# Patient Record
Sex: Female | Born: 1935 | Race: White | Hispanic: No | Smoking: Former smoker
Health system: Southern US, Community
[De-identification: ages and names within clinical notes are randomized; demographics above are authoritative.]

## PROBLEM LIST (undated history)

## (undated) DIAGNOSIS — I447 Left bundle-branch block, unspecified: Secondary | ICD-10-CM

## (undated) DIAGNOSIS — I739 Peripheral vascular disease, unspecified: Secondary | ICD-10-CM

## (undated) DIAGNOSIS — I4891 Unspecified atrial fibrillation: Secondary | ICD-10-CM

## (undated) DIAGNOSIS — I495 Sick sinus syndrome: Secondary | ICD-10-CM

## (undated) DIAGNOSIS — Z9289 Personal history of other medical treatment: Secondary | ICD-10-CM

## (undated) DIAGNOSIS — I2510 Atherosclerotic heart disease of native coronary artery without angina pectoris: Secondary | ICD-10-CM

## (undated) DIAGNOSIS — Z86711 Personal history of pulmonary embolism: Secondary | ICD-10-CM

## (undated) DIAGNOSIS — Z853 Personal history of malignant neoplasm of breast: Secondary | ICD-10-CM

## (undated) DIAGNOSIS — E785 Hyperlipidemia, unspecified: Secondary | ICD-10-CM

## (undated) DIAGNOSIS — R414 Neurologic neglect syndrome: Secondary | ICD-10-CM

## (undated) DIAGNOSIS — D509 Iron deficiency anemia, unspecified: Secondary | ICD-10-CM

## (undated) DIAGNOSIS — I10 Essential (primary) hypertension: Secondary | ICD-10-CM

## (undated) DIAGNOSIS — J449 Chronic obstructive pulmonary disease, unspecified: Secondary | ICD-10-CM

## (undated) DIAGNOSIS — E039 Hypothyroidism, unspecified: Secondary | ICD-10-CM

## (undated) DIAGNOSIS — R040 Epistaxis: Secondary | ICD-10-CM

## (undated) DIAGNOSIS — I350 Nonrheumatic aortic (valve) stenosis: Secondary | ICD-10-CM

## (undated) DIAGNOSIS — Z950 Presence of cardiac pacemaker: Secondary | ICD-10-CM

## (undated) DIAGNOSIS — Z8673 Personal history of transient ischemic attack (TIA), and cerebral infarction without residual deficits: Secondary | ICD-10-CM

## (undated) DIAGNOSIS — C801 Malignant (primary) neoplasm, unspecified: Secondary | ICD-10-CM

## (undated) DIAGNOSIS — Z8719 Personal history of other diseases of the digestive system: Secondary | ICD-10-CM

## (undated) DIAGNOSIS — M1990 Unspecified osteoarthritis, unspecified site: Secondary | ICD-10-CM

## (undated) DIAGNOSIS — K219 Gastro-esophageal reflux disease without esophagitis: Secondary | ICD-10-CM

## (undated) DIAGNOSIS — I639 Cerebral infarction, unspecified: Secondary | ICD-10-CM

## (undated) HISTORY — DX: Iron deficiency anemia, unspecified: D50.9

## (undated) HISTORY — PX: THYROIDECTOMY: SHX17

## (undated) HISTORY — DX: Personal history of transient ischemic attack (TIA), and cerebral infarction without residual deficits: Z86.73

## (undated) HISTORY — DX: Unspecified osteoarthritis, unspecified site: M19.90

## (undated) HISTORY — PX: TOTAL HIP ARTHROPLASTY: SHX124

## (undated) HISTORY — DX: Cerebral infarction, unspecified: I63.9

## (undated) HISTORY — PX: PACEMAKER PLACEMENT: SHX43

## (undated) HISTORY — DX: Peripheral vascular disease, unspecified: I73.9

## (undated) HISTORY — DX: Essential (primary) hypertension: I10

## (undated) HISTORY — DX: Sick sinus syndrome: I49.5

## (undated) HISTORY — DX: Epistaxis: R04.0

## (undated) HISTORY — DX: Presence of cardiac pacemaker: Z95.0

## (undated) HISTORY — PX: MITRAL VALVE REPAIR: SHX2039

## (undated) HISTORY — DX: Personal history of pulmonary embolism: Z86.711

## (undated) HISTORY — DX: Unspecified atrial fibrillation: I48.91

## (undated) HISTORY — DX: Personal history of malignant neoplasm of breast: Z85.3

## (undated) HISTORY — DX: Personal history of other diseases of the digestive system: Z87.19

## (undated) HISTORY — DX: Personal history of other medical treatment: Z92.89

## (undated) HISTORY — DX: Atherosclerotic heart disease of native coronary artery without angina pectoris: I25.10

## (undated) HISTORY — DX: Hyperlipidemia, unspecified: E78.5

## (undated) HISTORY — DX: Chronic obstructive pulmonary disease, unspecified: J44.9

## (undated) HISTORY — DX: Left bundle-branch block, unspecified: I44.7

## (undated) HISTORY — DX: Nonrheumatic aortic (valve) stenosis: I35.0

## (undated) HISTORY — PX: ABDOMINAL HYSTERECTOMY: SHX81

## (undated) HISTORY — DX: Hypothyroidism, unspecified: E03.9

## (undated) HISTORY — DX: Neurologic neglect syndrome: R41.4

## (undated) HISTORY — PX: MASTECTOMY: SHX3

## (undated) HISTORY — DX: Gastro-esophageal reflux disease without esophagitis: K21.9

## (undated) HISTORY — PX: INTRAOCULAR LENS INSERTION: SHX110

---

## 2008-12-26 ENCOUNTER — Encounter: Payer: Self-pay | Admitting: Cardiology

## 2008-12-26 ENCOUNTER — Encounter: Payer: Self-pay | Admitting: Internal Medicine

## 2009-01-15 ENCOUNTER — Encounter: Payer: Self-pay | Admitting: Cardiology

## 2009-01-15 ENCOUNTER — Encounter: Payer: Self-pay | Admitting: Internal Medicine

## 2009-04-01 ENCOUNTER — Encounter: Payer: Self-pay | Admitting: Cardiology

## 2009-04-02 ENCOUNTER — Encounter: Payer: Self-pay | Admitting: Internal Medicine

## 2009-04-02 ENCOUNTER — Encounter: Payer: Self-pay | Admitting: Cardiology

## 2009-04-03 ENCOUNTER — Encounter: Payer: Self-pay | Admitting: Cardiology

## 2009-04-03 ENCOUNTER — Encounter: Payer: Self-pay | Admitting: Internal Medicine

## 2009-04-20 ENCOUNTER — Encounter: Payer: Self-pay | Admitting: Cardiology

## 2009-05-25 ENCOUNTER — Ambulatory Visit: Payer: Self-pay | Admitting: Internal Medicine

## 2009-05-25 DIAGNOSIS — Z8541 Personal history of malignant neoplasm of cervix uteri: Secondary | ICD-10-CM

## 2009-05-25 DIAGNOSIS — J45909 Unspecified asthma, uncomplicated: Secondary | ICD-10-CM | POA: Insufficient documentation

## 2009-05-25 DIAGNOSIS — Z86718 Personal history of other venous thrombosis and embolism: Secondary | ICD-10-CM

## 2009-05-25 DIAGNOSIS — E039 Hypothyroidism, unspecified: Secondary | ICD-10-CM

## 2009-05-25 DIAGNOSIS — I4891 Unspecified atrial fibrillation: Secondary | ICD-10-CM

## 2009-05-25 DIAGNOSIS — M810 Age-related osteoporosis without current pathological fracture: Secondary | ICD-10-CM | POA: Insufficient documentation

## 2009-05-25 DIAGNOSIS — Z853 Personal history of malignant neoplasm of breast: Secondary | ICD-10-CM

## 2009-05-25 DIAGNOSIS — I2510 Atherosclerotic heart disease of native coronary artery without angina pectoris: Secondary | ICD-10-CM

## 2009-05-25 DIAGNOSIS — D509 Iron deficiency anemia, unspecified: Secondary | ICD-10-CM

## 2009-05-25 DIAGNOSIS — Z8679 Personal history of other diseases of the circulatory system: Secondary | ICD-10-CM | POA: Insufficient documentation

## 2009-05-25 DIAGNOSIS — E785 Hyperlipidemia, unspecified: Secondary | ICD-10-CM

## 2009-05-25 DIAGNOSIS — Z8719 Personal history of other diseases of the digestive system: Secondary | ICD-10-CM

## 2009-05-25 LAB — CONVERTED CEMR LAB
Albumin: 4 g/dL (ref 3.5–5.2)
Alkaline Phosphatase: 90 units/L (ref 39–117)
Basophils Relative: 2 % — ABNORMAL HIGH (ref 0–1)
Bilirubin, Direct: 0.1 mg/dL (ref 0.0–0.3)
Calcium: 8.7 mg/dL (ref 8.4–10.5)
Eosinophils Relative: 5 % (ref 0–5)
Glucose, Bld: 116 mg/dL — ABNORMAL HIGH (ref 70–99)
HCT: 30 % — ABNORMAL LOW (ref 36.0–46.0)
Hemoglobin: 9.4 g/dL — ABNORMAL LOW (ref 12.0–15.0)
MCHC: 31.3 g/dL (ref 30.0–36.0)
MCV: 87.5 fL (ref 78.0–100.0)
Monocytes Absolute: 0.6 10*3/uL (ref 0.1–1.0)
Monocytes Relative: 9 % (ref 3–12)
RDW: 17.2 % — ABNORMAL HIGH (ref 11.5–15.5)
Sodium: 136 meq/L (ref 135–145)
Total Bilirubin: 0.6 mg/dL (ref 0.3–1.2)
Vit D, 1,25-Dihydroxy: 39 (ref 30–89)

## 2009-05-27 ENCOUNTER — Emergency Department (HOSPITAL_COMMUNITY): Admission: EM | Admit: 2009-05-27 | Discharge: 2009-05-27 | Payer: Self-pay | Admitting: Emergency Medicine

## 2009-05-27 ENCOUNTER — Telehealth: Payer: Self-pay | Admitting: Internal Medicine

## 2009-05-28 ENCOUNTER — Telehealth: Payer: Self-pay | Admitting: Internal Medicine

## 2009-05-28 ENCOUNTER — Ambulatory Visit: Payer: Self-pay | Admitting: Hematology & Oncology

## 2009-06-01 ENCOUNTER — Ambulatory Visit: Payer: Self-pay | Admitting: Cardiovascular Disease

## 2009-06-01 LAB — CONVERTED CEMR LAB: POC INR: 2.4

## 2009-06-08 ENCOUNTER — Encounter: Payer: Self-pay | Admitting: Cardiology

## 2009-06-08 ENCOUNTER — Ambulatory Visit: Payer: Self-pay | Admitting: Internal Medicine

## 2009-06-08 DIAGNOSIS — Z87891 Personal history of nicotine dependence: Secondary | ICD-10-CM | POA: Insufficient documentation

## 2009-06-08 DIAGNOSIS — J4489 Other specified chronic obstructive pulmonary disease: Secondary | ICD-10-CM | POA: Insufficient documentation

## 2009-06-08 DIAGNOSIS — R040 Epistaxis: Secondary | ICD-10-CM

## 2009-06-08 DIAGNOSIS — R079 Chest pain, unspecified: Secondary | ICD-10-CM

## 2009-06-08 DIAGNOSIS — J449 Chronic obstructive pulmonary disease, unspecified: Secondary | ICD-10-CM

## 2009-06-08 LAB — CONVERTED CEMR LAB: INR: 2.83 — ABNORMAL HIGH (ref <1.50)

## 2009-06-10 ENCOUNTER — Encounter: Payer: Self-pay | Admitting: Internal Medicine

## 2009-06-12 ENCOUNTER — Telehealth: Payer: Self-pay | Admitting: Internal Medicine

## 2009-06-15 ENCOUNTER — Encounter: Payer: Self-pay | Admitting: Cardiology

## 2009-06-15 ENCOUNTER — Telehealth: Payer: Self-pay | Admitting: Internal Medicine

## 2009-06-19 ENCOUNTER — Encounter: Payer: Self-pay | Admitting: Internal Medicine

## 2009-06-24 ENCOUNTER — Encounter: Payer: Self-pay | Admitting: Cardiology

## 2009-06-24 ENCOUNTER — Ambulatory Visit: Payer: Self-pay | Admitting: Cardiology

## 2009-06-24 DIAGNOSIS — Z9889 Other specified postprocedural states: Secondary | ICD-10-CM

## 2009-06-24 DIAGNOSIS — Z950 Presence of cardiac pacemaker: Secondary | ICD-10-CM | POA: Insufficient documentation

## 2009-06-24 DIAGNOSIS — I350 Nonrheumatic aortic (valve) stenosis: Secondary | ICD-10-CM | POA: Insufficient documentation

## 2009-06-25 ENCOUNTER — Ambulatory Visit: Payer: Self-pay | Admitting: Internal Medicine

## 2009-06-26 ENCOUNTER — Encounter: Payer: Self-pay | Admitting: Internal Medicine

## 2009-06-26 ENCOUNTER — Telehealth: Payer: Self-pay | Admitting: Internal Medicine

## 2009-06-30 ENCOUNTER — Ambulatory Visit: Payer: Self-pay | Admitting: Internal Medicine

## 2009-06-30 ENCOUNTER — Encounter: Payer: Self-pay | Admitting: Family

## 2009-06-30 LAB — CONVERTED CEMR LAB: INR: 3.17 — ABNORMAL HIGH (ref <1.50)

## 2009-07-01 ENCOUNTER — Telehealth: Payer: Self-pay | Admitting: Internal Medicine

## 2009-07-03 ENCOUNTER — Ambulatory Visit: Payer: Self-pay | Admitting: Hematology & Oncology

## 2009-07-06 ENCOUNTER — Encounter: Payer: Self-pay | Admitting: Internal Medicine

## 2009-07-06 LAB — CBC WITH DIFFERENTIAL (CANCER CENTER ONLY)
BASO%: 0.7 % (ref 0.0–2.0)
HCT: 24.8 % — ABNORMAL LOW (ref 34.8–46.6)
LYMPH#: 1.6 10*3/uL (ref 0.9–3.3)
MONO#: 0.4 10*3/uL (ref 0.1–0.9)
NEUT#: 4.4 10*3/uL (ref 1.5–6.5)
NEUT%: 65.1 % (ref 39.6–80.0)
Platelets: 331 10*3/uL (ref 145–400)
RDW: 15.1 % — ABNORMAL HIGH (ref 10.5–14.6)
WBC: 6.7 10*3/uL (ref 3.9–10.0)

## 2009-07-06 LAB — PROTHROMBIN TIME: INR: 1.39 (ref <1.50)

## 2009-07-07 LAB — COMPREHENSIVE METABOLIC PANEL
Albumin: 4.1 g/dL (ref 3.5–5.2)
Alkaline Phosphatase: 85 U/L (ref 39–117)
BUN: 21 mg/dL (ref 6–23)
Calcium: 8.7 mg/dL (ref 8.4–10.5)
Glucose, Bld: 114 mg/dL — ABNORMAL HIGH (ref 70–99)
Potassium: 4.7 mEq/L (ref 3.5–5.3)

## 2009-07-07 LAB — VITAMIN D 25 HYDROXY (VIT D DEFICIENCY, FRACTURES): Vit D, 25-Hydroxy: 31 ng/mL (ref 30–89)

## 2009-07-07 LAB — FERRITIN: Ferritin: 15 ng/mL (ref 10–291)

## 2009-07-07 LAB — RETICULOCYTES (CHCC): Retic Ct Pct: 0.9 % (ref 0.4–3.1)

## 2009-07-15 ENCOUNTER — Ambulatory Visit (HOSPITAL_COMMUNITY): Admission: RE | Admit: 2009-07-15 | Discharge: 2009-07-15 | Payer: Self-pay | Admitting: Cardiology

## 2009-07-15 ENCOUNTER — Encounter: Payer: Self-pay | Admitting: Cardiology

## 2009-07-15 ENCOUNTER — Ambulatory Visit: Payer: Self-pay | Admitting: Cardiology

## 2009-07-15 ENCOUNTER — Ambulatory Visit: Payer: Self-pay

## 2009-07-22 ENCOUNTER — Encounter: Payer: Self-pay | Admitting: Cardiology

## 2009-07-22 ENCOUNTER — Ambulatory Visit: Payer: Self-pay | Admitting: Cardiology

## 2009-07-27 ENCOUNTER — Encounter: Payer: Self-pay | Admitting: Internal Medicine

## 2009-07-27 ENCOUNTER — Telehealth: Payer: Self-pay | Admitting: Internal Medicine

## 2009-07-27 LAB — CHCC SATELLITE - SMEAR

## 2009-07-27 LAB — CBC WITH DIFFERENTIAL (CANCER CENTER ONLY)
BASO%: 0.6 % (ref 0.0–2.0)
LYMPH%: 21.1 % (ref 14.0–48.0)
MCV: 84 fL (ref 81–101)
MONO#: 0.4 10*3/uL (ref 0.1–0.9)
Platelets: 252 10*3/uL (ref 145–400)
RDW: 18.9 % — ABNORMAL HIGH (ref 10.5–14.6)
WBC: 6.4 10*3/uL (ref 3.9–10.0)

## 2009-07-29 LAB — RETICULOCYTES (CHCC)
ABS Retic: 43.7 10*3/uL (ref 19.0–186.0)
RBC.: 3.97 MIL/uL (ref 3.87–5.11)

## 2009-07-29 LAB — HEMOGLOBINOPATHY EVALUATION
Hgb A2 Quant: 2.2 % (ref 2.2–3.2)
Hgb F Quant: 0 % (ref 0.0–2.0)

## 2009-08-02 ENCOUNTER — Encounter: Payer: Self-pay | Admitting: Emergency Medicine

## 2009-08-02 ENCOUNTER — Ambulatory Visit: Payer: Self-pay | Admitting: Diagnostic Radiology

## 2009-08-02 ENCOUNTER — Telehealth: Payer: Self-pay | Admitting: Family Medicine

## 2009-08-03 ENCOUNTER — Telehealth: Payer: Self-pay | Admitting: Internal Medicine

## 2009-08-03 ENCOUNTER — Inpatient Hospital Stay (HOSPITAL_COMMUNITY): Admission: EM | Admit: 2009-08-03 | Discharge: 2009-08-04 | Payer: Self-pay | Admitting: Internal Medicine

## 2009-08-05 ENCOUNTER — Telehealth: Payer: Self-pay | Admitting: Internal Medicine

## 2009-08-05 ENCOUNTER — Encounter: Payer: Self-pay | Admitting: Internal Medicine

## 2009-08-05 ENCOUNTER — Ambulatory Visit: Payer: Self-pay | Admitting: Internal Medicine

## 2009-08-05 DIAGNOSIS — R5381 Other malaise: Secondary | ICD-10-CM | POA: Insufficient documentation

## 2009-08-05 DIAGNOSIS — R413 Other amnesia: Secondary | ICD-10-CM

## 2009-08-05 DIAGNOSIS — J209 Acute bronchitis, unspecified: Secondary | ICD-10-CM

## 2009-08-05 LAB — CONVERTED CEMR LAB
POC INR: 1.45
Prothrombin Time: 17.5 s — ABNORMAL HIGH (ref 11.6–15.2)
Vitamin B-12: 598 pg/mL (ref 211–911)

## 2009-08-10 ENCOUNTER — Ambulatory Visit: Payer: Self-pay | Admitting: Diagnostic Radiology

## 2009-08-10 ENCOUNTER — Ambulatory Visit: Payer: Self-pay | Admitting: Internal Medicine

## 2009-08-10 ENCOUNTER — Ambulatory Visit (HOSPITAL_BASED_OUTPATIENT_CLINIC_OR_DEPARTMENT_OTHER): Admission: RE | Admit: 2009-08-10 | Discharge: 2009-08-10 | Payer: Self-pay | Admitting: Internal Medicine

## 2009-08-11 ENCOUNTER — Encounter: Payer: Self-pay | Admitting: Internal Medicine

## 2009-08-12 ENCOUNTER — Ambulatory Visit: Payer: Self-pay | Admitting: Diagnostic Radiology

## 2009-08-12 ENCOUNTER — Telehealth: Payer: Self-pay | Admitting: Internal Medicine

## 2009-08-12 ENCOUNTER — Encounter: Payer: Self-pay | Admitting: Internal Medicine

## 2009-08-12 ENCOUNTER — Ambulatory Visit (HOSPITAL_BASED_OUTPATIENT_CLINIC_OR_DEPARTMENT_OTHER): Admission: RE | Admit: 2009-08-12 | Discharge: 2009-08-12 | Payer: Self-pay | Admitting: Internal Medicine

## 2009-08-13 ENCOUNTER — Telehealth: Payer: Self-pay | Admitting: Internal Medicine

## 2009-08-14 ENCOUNTER — Encounter: Payer: Self-pay | Admitting: Internal Medicine

## 2009-08-19 ENCOUNTER — Telehealth: Payer: Self-pay | Admitting: Internal Medicine

## 2009-08-20 ENCOUNTER — Telehealth: Payer: Self-pay | Admitting: Internal Medicine

## 2009-08-20 ENCOUNTER — Encounter: Payer: Self-pay | Admitting: Internal Medicine

## 2009-08-20 ENCOUNTER — Ambulatory Visit: Payer: Self-pay | Admitting: Hematology & Oncology

## 2009-08-21 ENCOUNTER — Telehealth: Payer: Self-pay | Admitting: Internal Medicine

## 2009-08-25 ENCOUNTER — Telehealth: Payer: Self-pay | Admitting: Internal Medicine

## 2009-08-27 ENCOUNTER — Encounter: Payer: Self-pay | Admitting: Internal Medicine

## 2009-08-27 ENCOUNTER — Telehealth: Payer: Self-pay | Admitting: Internal Medicine

## 2009-08-28 ENCOUNTER — Encounter: Payer: Self-pay | Admitting: Internal Medicine

## 2009-08-28 ENCOUNTER — Telehealth (INDEPENDENT_AMBULATORY_CARE_PROVIDER_SITE_OTHER): Payer: Self-pay | Admitting: *Deleted

## 2009-08-31 ENCOUNTER — Ambulatory Visit: Payer: Self-pay | Admitting: Internal Medicine

## 2009-08-31 DIAGNOSIS — G4700 Insomnia, unspecified: Secondary | ICD-10-CM

## 2009-08-31 DIAGNOSIS — N993 Prolapse of vaginal vault after hysterectomy: Secondary | ICD-10-CM

## 2009-08-31 LAB — CBC WITH DIFFERENTIAL (CANCER CENTER ONLY)
BASO%: 0.6 % (ref 0.0–2.0)
EOS%: 4.7 % (ref 0.0–7.0)
HCT: 39.2 % (ref 34.8–46.6)
LYMPH%: 26 % (ref 14.0–48.0)
MCHC: 33.2 g/dL (ref 32.0–36.0)
MCV: 91 fL (ref 81–101)
MONO%: 7.3 % (ref 0.0–13.0)
NEUT%: 61.4 % (ref 39.6–80.0)
Platelets: 254 10*3/uL (ref 145–400)
RDW: 18.8 % — ABNORMAL HIGH (ref 10.5–14.6)

## 2009-09-01 ENCOUNTER — Telehealth: Payer: Self-pay | Admitting: Internal Medicine

## 2009-09-02 ENCOUNTER — Encounter: Payer: Self-pay | Admitting: Internal Medicine

## 2009-09-03 ENCOUNTER — Ambulatory Visit: Payer: Self-pay | Admitting: Internal Medicine

## 2009-09-04 ENCOUNTER — Telehealth: Payer: Self-pay | Admitting: Internal Medicine

## 2009-09-08 ENCOUNTER — Encounter: Payer: Self-pay | Admitting: Internal Medicine

## 2009-09-08 ENCOUNTER — Telehealth: Payer: Self-pay | Admitting: Internal Medicine

## 2009-09-11 ENCOUNTER — Encounter: Payer: Self-pay | Admitting: Internal Medicine

## 2009-09-11 ENCOUNTER — Ambulatory Visit: Payer: Self-pay | Admitting: Internal Medicine

## 2009-09-15 ENCOUNTER — Telehealth: Payer: Self-pay | Admitting: Internal Medicine

## 2009-09-15 ENCOUNTER — Encounter: Payer: Self-pay | Admitting: Internal Medicine

## 2009-09-18 ENCOUNTER — Encounter: Payer: Self-pay | Admitting: Internal Medicine

## 2009-09-21 ENCOUNTER — Encounter: Payer: Self-pay | Admitting: Internal Medicine

## 2009-09-22 ENCOUNTER — Encounter: Payer: Self-pay | Admitting: Internal Medicine

## 2009-09-22 ENCOUNTER — Telehealth: Payer: Self-pay | Admitting: Internal Medicine

## 2009-09-23 ENCOUNTER — Ambulatory Visit: Payer: Self-pay | Admitting: Family Medicine

## 2009-09-23 ENCOUNTER — Telehealth: Payer: Self-pay | Admitting: Internal Medicine

## 2009-09-24 ENCOUNTER — Ambulatory Visit: Payer: Self-pay | Admitting: Internal Medicine

## 2009-09-27 ENCOUNTER — Encounter: Payer: Self-pay | Admitting: Internal Medicine

## 2009-09-28 ENCOUNTER — Telehealth: Payer: Self-pay | Admitting: Internal Medicine

## 2009-09-30 ENCOUNTER — Encounter: Payer: Self-pay | Admitting: Internal Medicine

## 2009-10-04 ENCOUNTER — Encounter: Payer: Self-pay | Admitting: Internal Medicine

## 2009-10-08 ENCOUNTER — Ambulatory Visit: Payer: Self-pay | Admitting: Internal Medicine

## 2009-10-13 ENCOUNTER — Telehealth: Payer: Self-pay | Admitting: Internal Medicine

## 2009-10-28 ENCOUNTER — Telehealth: Payer: Self-pay | Admitting: Internal Medicine

## 2009-10-29 ENCOUNTER — Telehealth: Payer: Self-pay | Admitting: Internal Medicine

## 2009-10-30 ENCOUNTER — Ambulatory Visit: Payer: Self-pay | Admitting: Internal Medicine

## 2009-10-30 ENCOUNTER — Ambulatory Visit: Payer: Self-pay | Admitting: Radiology

## 2009-10-30 ENCOUNTER — Telehealth: Payer: Self-pay | Admitting: Internal Medicine

## 2009-10-30 ENCOUNTER — Ambulatory Visit (HOSPITAL_BASED_OUTPATIENT_CLINIC_OR_DEPARTMENT_OTHER): Admission: RE | Admit: 2009-10-30 | Discharge: 2009-10-30 | Payer: Self-pay | Admitting: Internal Medicine

## 2009-10-30 DIAGNOSIS — R17 Unspecified jaundice: Secondary | ICD-10-CM | POA: Insufficient documentation

## 2009-10-30 DIAGNOSIS — N644 Mastodynia: Secondary | ICD-10-CM | POA: Insufficient documentation

## 2009-10-30 LAB — CONVERTED CEMR LAB
AST: 44 units/L — ABNORMAL HIGH (ref 0–37)
Albumin: 3.6 g/dL (ref 3.5–5.2)
BUN: 32 mg/dL — ABNORMAL HIGH (ref 6–23)
Basophils Absolute: 0 10*3/uL (ref 0.0–0.1)
Basophils Relative: 0 % (ref 0–1)
Calcium: 7.9 mg/dL — ABNORMAL LOW (ref 8.4–10.5)
Eosinophils Absolute: 0 10*3/uL (ref 0.0–0.7)
Glucose, Bld: 76 mg/dL (ref 70–99)
MCHC: 32.5 g/dL (ref 30.0–36.0)
MCV: 97.8 fL (ref 78.0–100.0)
Neutrophils Relative %: 82 % — ABNORMAL HIGH (ref 43–77)
Platelets: 217 10*3/uL (ref 150–400)
Total Bilirubin: 0.9 mg/dL (ref 0.3–1.2)

## 2009-11-02 ENCOUNTER — Telehealth: Payer: Self-pay | Admitting: Internal Medicine

## 2009-11-03 ENCOUNTER — Encounter: Payer: Self-pay | Admitting: Internal Medicine

## 2009-11-03 LAB — HM MAMMOGRAPHY: HM Mammogram: NORMAL

## 2009-11-05 ENCOUNTER — Ambulatory Visit: Payer: Self-pay | Admitting: Internal Medicine

## 2009-11-05 DIAGNOSIS — F329 Major depressive disorder, single episode, unspecified: Secondary | ICD-10-CM

## 2009-11-05 LAB — CONVERTED CEMR LAB: Prothrombin Time: 24.2 s — ABNORMAL HIGH (ref 11.6–15.2)

## 2009-11-06 ENCOUNTER — Telehealth: Payer: Self-pay | Admitting: Internal Medicine

## 2009-11-06 ENCOUNTER — Encounter: Payer: Self-pay | Admitting: Internal Medicine

## 2009-11-09 ENCOUNTER — Telehealth: Payer: Self-pay | Admitting: Internal Medicine

## 2009-11-09 LAB — CONVERTED CEMR LAB
INR: 2.55 — ABNORMAL HIGH (ref <1.50)
Prothrombin Time: 27.2 s — ABNORMAL HIGH (ref 11.6–15.2)

## 2009-11-10 ENCOUNTER — Telehealth: Payer: Self-pay | Admitting: Internal Medicine

## 2009-11-10 ENCOUNTER — Encounter: Payer: Self-pay | Admitting: Internal Medicine

## 2009-11-11 ENCOUNTER — Encounter: Payer: Self-pay | Admitting: Internal Medicine

## 2009-11-12 ENCOUNTER — Telehealth: Payer: Self-pay | Admitting: Internal Medicine

## 2009-11-13 ENCOUNTER — Telehealth: Payer: Self-pay | Admitting: Internal Medicine

## 2009-11-16 ENCOUNTER — Encounter: Payer: Self-pay | Admitting: Internal Medicine

## 2009-11-17 ENCOUNTER — Telehealth: Payer: Self-pay | Admitting: Internal Medicine

## 2009-11-30 ENCOUNTER — Ambulatory Visit: Payer: Self-pay | Admitting: Hematology & Oncology

## 2009-12-04 ENCOUNTER — Ambulatory Visit: Payer: Self-pay | Admitting: Internal Medicine

## 2009-12-04 DIAGNOSIS — I739 Peripheral vascular disease, unspecified: Secondary | ICD-10-CM | POA: Insufficient documentation

## 2009-12-07 ENCOUNTER — Telehealth: Payer: Self-pay | Admitting: Internal Medicine

## 2009-12-09 ENCOUNTER — Telehealth: Payer: Self-pay | Admitting: Internal Medicine

## 2009-12-10 ENCOUNTER — Encounter: Payer: Self-pay | Admitting: Internal Medicine

## 2009-12-14 ENCOUNTER — Encounter: Payer: Self-pay | Admitting: Internal Medicine

## 2009-12-14 LAB — CONVERTED CEMR LAB: INR: 1.39 (ref <1.50)

## 2009-12-15 ENCOUNTER — Telehealth: Payer: Self-pay | Admitting: Internal Medicine

## 2009-12-16 ENCOUNTER — Telehealth: Payer: Self-pay | Admitting: Internal Medicine

## 2009-12-17 ENCOUNTER — Encounter: Payer: Self-pay | Admitting: Internal Medicine

## 2009-12-21 ENCOUNTER — Telehealth: Payer: Self-pay | Admitting: Internal Medicine

## 2009-12-25 ENCOUNTER — Encounter: Payer: Self-pay | Admitting: Cardiology

## 2009-12-28 ENCOUNTER — Telehealth: Payer: Self-pay | Admitting: Internal Medicine

## 2010-01-13 ENCOUNTER — Encounter: Payer: Self-pay | Admitting: Internal Medicine

## 2010-01-25 ENCOUNTER — Ambulatory Visit: Payer: Self-pay | Admitting: Radiology

## 2010-01-25 ENCOUNTER — Ambulatory Visit: Payer: Self-pay | Admitting: Internal Medicine

## 2010-01-25 ENCOUNTER — Ambulatory Visit (HOSPITAL_BASED_OUTPATIENT_CLINIC_OR_DEPARTMENT_OTHER): Admission: RE | Admit: 2010-01-25 | Discharge: 2010-01-25 | Payer: Self-pay | Admitting: Internal Medicine

## 2010-01-25 DIAGNOSIS — J189 Pneumonia, unspecified organism: Secondary | ICD-10-CM

## 2010-01-26 ENCOUNTER — Telehealth: Payer: Self-pay | Admitting: Internal Medicine

## 2010-02-01 LAB — CONVERTED CEMR LAB
INR: 4.48 — ABNORMAL HIGH (ref <1.50)
Prothrombin Time: 42.3 s — ABNORMAL HIGH (ref 11.6–15.2)

## 2010-02-02 ENCOUNTER — Encounter: Payer: Self-pay | Admitting: Internal Medicine

## 2010-02-02 ENCOUNTER — Telehealth: Payer: Self-pay | Admitting: Internal Medicine

## 2010-02-09 ENCOUNTER — Telehealth: Payer: Self-pay | Admitting: Internal Medicine

## 2010-02-09 ENCOUNTER — Ambulatory Visit: Payer: Self-pay | Admitting: Diagnostic Radiology

## 2010-02-09 ENCOUNTER — Observation Stay (HOSPITAL_COMMUNITY): Admission: EM | Admit: 2010-02-09 | Discharge: 2010-02-15 | Payer: Self-pay | Admitting: Internal Medicine

## 2010-02-09 ENCOUNTER — Emergency Department (HOSPITAL_BASED_OUTPATIENT_CLINIC_OR_DEPARTMENT_OTHER): Admission: EM | Admit: 2010-02-09 | Discharge: 2010-02-09 | Payer: Self-pay | Admitting: Emergency Medicine

## 2010-02-10 ENCOUNTER — Telehealth: Payer: Self-pay | Admitting: Internal Medicine

## 2010-02-16 ENCOUNTER — Telehealth: Payer: Self-pay | Admitting: Internal Medicine

## 2010-02-17 ENCOUNTER — Telehealth: Payer: Self-pay | Admitting: Internal Medicine

## 2010-02-18 LAB — CONVERTED CEMR LAB
Basophils Absolute: 0.1 10*3/uL (ref 0.0–0.1)
Hemoglobin: 10.5 g/dL — ABNORMAL LOW (ref 12.0–15.0)
INR: 2.06 — ABNORMAL HIGH (ref <1.50)
Lymphocytes Relative: 28 % (ref 12–46)
Monocytes Absolute: 0.8 10*3/uL (ref 0.1–1.0)
Neutro Abs: 4.2 10*3/uL (ref 1.7–7.7)
Neutrophils Relative %: 56 % (ref 43–77)
Prothrombin Time: 23 s — ABNORMAL HIGH (ref 11.6–15.2)
RDW: 15.2 % (ref 11.5–15.5)

## 2010-02-19 ENCOUNTER — Telehealth: Payer: Self-pay | Admitting: Internal Medicine

## 2010-02-23 ENCOUNTER — Encounter: Payer: Self-pay | Admitting: Internal Medicine

## 2010-02-24 ENCOUNTER — Encounter: Payer: Self-pay | Admitting: Internal Medicine

## 2010-02-25 ENCOUNTER — Encounter: Payer: Self-pay | Admitting: Internal Medicine

## 2010-02-25 ENCOUNTER — Telehealth: Payer: Self-pay | Admitting: Internal Medicine

## 2010-02-26 ENCOUNTER — Ambulatory Visit: Payer: Self-pay | Admitting: Internal Medicine

## 2010-02-26 DIAGNOSIS — R071 Chest pain on breathing: Secondary | ICD-10-CM | POA: Insufficient documentation

## 2010-02-26 LAB — CONVERTED CEMR LAB
INR: 2.51 — ABNORMAL HIGH (ref <1.50)
Prothrombin Time: 26.9 s — ABNORMAL HIGH (ref 11.6–15.2)

## 2010-03-01 ENCOUNTER — Encounter: Payer: Self-pay | Admitting: Internal Medicine

## 2010-03-01 ENCOUNTER — Telehealth: Payer: Self-pay | Admitting: Internal Medicine

## 2010-03-02 ENCOUNTER — Encounter: Payer: Self-pay | Admitting: Internal Medicine

## 2010-03-10 ENCOUNTER — Telehealth: Payer: Self-pay | Admitting: Internal Medicine

## 2010-03-18 ENCOUNTER — Encounter: Payer: Self-pay | Admitting: Internal Medicine

## 2010-03-29 ENCOUNTER — Encounter: Payer: Self-pay | Admitting: Internal Medicine

## 2010-04-10 ENCOUNTER — Emergency Department (HOSPITAL_BASED_OUTPATIENT_CLINIC_OR_DEPARTMENT_OTHER): Admission: EM | Admit: 2010-04-10 | Discharge: 2010-04-10 | Payer: Self-pay | Admitting: Emergency Medicine

## 2010-04-10 ENCOUNTER — Ambulatory Visit: Payer: Self-pay | Admitting: Diagnostic Radiology

## 2010-04-18 ENCOUNTER — Emergency Department (HOSPITAL_COMMUNITY): Admission: EM | Admit: 2010-04-18 | Discharge: 2010-04-18 | Payer: Self-pay | Admitting: Emergency Medicine

## 2010-04-20 ENCOUNTER — Telehealth: Payer: Self-pay | Admitting: Internal Medicine

## 2010-04-20 ENCOUNTER — Encounter: Payer: Self-pay | Admitting: Internal Medicine

## 2010-04-20 ENCOUNTER — Ambulatory Visit: Payer: Self-pay | Admitting: Cardiovascular Disease

## 2010-04-20 ENCOUNTER — Ambulatory Visit: Payer: Self-pay

## 2010-04-21 ENCOUNTER — Ambulatory Visit: Payer: Self-pay | Admitting: Internal Medicine

## 2010-04-21 ENCOUNTER — Telehealth: Payer: Self-pay | Admitting: Internal Medicine

## 2010-04-23 ENCOUNTER — Observation Stay (HOSPITAL_COMMUNITY): Admission: EM | Admit: 2010-04-23 | Discharge: 2010-04-23 | Payer: Self-pay | Admitting: Emergency Medicine

## 2010-04-26 ENCOUNTER — Telehealth: Payer: Self-pay | Admitting: Internal Medicine

## 2010-04-27 ENCOUNTER — Telehealth: Payer: Self-pay | Admitting: Internal Medicine

## 2010-04-29 ENCOUNTER — Encounter: Payer: Self-pay | Admitting: Internal Medicine

## 2010-05-03 ENCOUNTER — Telehealth: Payer: Self-pay | Admitting: Internal Medicine

## 2010-05-05 ENCOUNTER — Telehealth: Payer: Self-pay | Admitting: Internal Medicine

## 2010-05-05 ENCOUNTER — Ambulatory Visit: Payer: Self-pay | Admitting: Internal Medicine

## 2010-05-07 ENCOUNTER — Encounter: Payer: Self-pay | Admitting: Internal Medicine

## 2010-05-11 ENCOUNTER — Ambulatory Visit: Payer: Self-pay | Admitting: Cardiovascular Disease

## 2010-05-12 ENCOUNTER — Encounter: Payer: Self-pay | Admitting: Internal Medicine

## 2010-05-13 ENCOUNTER — Encounter: Payer: Self-pay | Admitting: Internal Medicine

## 2010-05-14 ENCOUNTER — Telehealth: Payer: Self-pay | Admitting: Internal Medicine

## 2010-05-14 ENCOUNTER — Ambulatory Visit: Payer: Self-pay | Admitting: Internal Medicine

## 2010-05-14 ENCOUNTER — Encounter: Payer: Self-pay | Admitting: Internal Medicine

## 2010-05-14 ENCOUNTER — Ambulatory Visit: Payer: Self-pay | Admitting: Diagnostic Radiology

## 2010-05-14 ENCOUNTER — Ambulatory Visit (HOSPITAL_BASED_OUTPATIENT_CLINIC_OR_DEPARTMENT_OTHER)
Admission: RE | Admit: 2010-05-14 | Discharge: 2010-05-14 | Payer: Self-pay | Source: Home / Self Care | Admitting: Internal Medicine

## 2010-05-14 DIAGNOSIS — L02619 Cutaneous abscess of unspecified foot: Secondary | ICD-10-CM | POA: Insufficient documentation

## 2010-05-14 DIAGNOSIS — L03119 Cellulitis of unspecified part of limb: Secondary | ICD-10-CM

## 2010-05-17 ENCOUNTER — Encounter: Payer: Self-pay | Admitting: Internal Medicine

## 2010-05-20 ENCOUNTER — Ambulatory Visit: Payer: Self-pay | Admitting: Internal Medicine

## 2010-05-27 ENCOUNTER — Ambulatory Visit: Payer: Self-pay

## 2010-05-27 ENCOUNTER — Ambulatory Visit: Payer: Self-pay | Admitting: Cardiovascular Disease

## 2010-05-28 ENCOUNTER — Telehealth: Payer: Self-pay | Admitting: Internal Medicine

## 2010-05-28 ENCOUNTER — Encounter: Payer: Self-pay | Admitting: Internal Medicine

## 2010-05-28 LAB — CONVERTED CEMR LAB
BUN: 30 mg/dL — ABNORMAL HIGH (ref 6–23)
Calcium: 8.4 mg/dL (ref 8.4–10.5)
Creatinine, Ser: 1.12 mg/dL (ref 0.40–1.20)
Glucose, Bld: 103 mg/dL — ABNORMAL HIGH (ref 70–99)
Sodium: 139 meq/L (ref 135–145)
TSH: 0.11 microintl units/mL — ABNORMAL LOW (ref 0.350–4.500)

## 2010-05-31 ENCOUNTER — Encounter: Payer: Self-pay | Admitting: Internal Medicine

## 2010-06-01 ENCOUNTER — Telehealth: Payer: Self-pay | Admitting: Cardiovascular Disease

## 2010-06-01 ENCOUNTER — Encounter (HOSPITAL_BASED_OUTPATIENT_CLINIC_OR_DEPARTMENT_OTHER): Admission: RE | Admit: 2010-06-01 | Discharge: 2010-06-04 | Payer: Self-pay | Admitting: General Surgery

## 2010-06-04 ENCOUNTER — Ambulatory Visit: Payer: Self-pay | Admitting: Vascular Surgery

## 2010-06-07 ENCOUNTER — Encounter: Payer: Self-pay | Admitting: Internal Medicine

## 2010-06-14 ENCOUNTER — Encounter: Payer: Self-pay | Admitting: Internal Medicine

## 2010-06-15 ENCOUNTER — Telehealth: Payer: Self-pay | Admitting: Internal Medicine

## 2010-06-16 ENCOUNTER — Telehealth: Payer: Self-pay | Admitting: Internal Medicine

## 2010-06-18 ENCOUNTER — Ambulatory Visit: Payer: Self-pay | Admitting: Vascular Surgery

## 2010-06-18 ENCOUNTER — Telehealth: Payer: Self-pay | Admitting: Internal Medicine

## 2010-06-22 ENCOUNTER — Encounter: Payer: Self-pay | Admitting: Internal Medicine

## 2010-06-29 ENCOUNTER — Telehealth: Payer: Self-pay | Admitting: Internal Medicine

## 2010-06-29 ENCOUNTER — Inpatient Hospital Stay (HOSPITAL_COMMUNITY): Admission: EM | Admit: 2010-06-29 | Discharge: 2010-07-02 | Payer: Self-pay | Admitting: Emergency Medicine

## 2010-06-30 ENCOUNTER — Encounter: Payer: Self-pay | Admitting: Internal Medicine

## 2010-07-02 ENCOUNTER — Encounter: Payer: Self-pay | Admitting: Internal Medicine

## 2010-07-05 ENCOUNTER — Encounter: Payer: Self-pay | Admitting: Internal Medicine

## 2010-07-05 ENCOUNTER — Telehealth: Payer: Self-pay | Admitting: Internal Medicine

## 2010-07-06 ENCOUNTER — Emergency Department (HOSPITAL_BASED_OUTPATIENT_CLINIC_OR_DEPARTMENT_OTHER): Admission: EM | Admit: 2010-07-06 | Discharge: 2010-07-06 | Payer: Self-pay | Admitting: Emergency Medicine

## 2010-07-06 ENCOUNTER — Encounter: Payer: Self-pay | Admitting: Internal Medicine

## 2010-07-07 ENCOUNTER — Telehealth: Payer: Self-pay | Admitting: Internal Medicine

## 2010-07-07 ENCOUNTER — Ambulatory Visit: Payer: Self-pay | Admitting: Diagnostic Radiology

## 2010-07-07 ENCOUNTER — Ambulatory Visit (HOSPITAL_BASED_OUTPATIENT_CLINIC_OR_DEPARTMENT_OTHER)
Admission: RE | Admit: 2010-07-07 | Discharge: 2010-07-07 | Payer: Self-pay | Source: Home / Self Care | Admitting: Internal Medicine

## 2010-07-07 ENCOUNTER — Ambulatory Visit: Payer: Self-pay | Admitting: Internal Medicine

## 2010-07-07 LAB — CONVERTED CEMR LAB
Chloride: 105 meq/L (ref 96–112)
MCHC: 32.4 g/dL (ref 30.0–36.0)
Platelets: 352 10*3/uL (ref 150–400)
Potassium: 4.6 meq/L (ref 3.5–5.3)
Pro B Natriuretic peptide (BNP): 218.8 pg/mL — ABNORMAL HIGH (ref 0.0–100.0)
RDW: 19.4 % — ABNORMAL HIGH (ref 11.5–15.5)
TSH: 0.9 microintl units/mL (ref 0.350–4.500)

## 2010-07-08 ENCOUNTER — Encounter: Payer: Self-pay | Admitting: Internal Medicine

## 2010-07-09 ENCOUNTER — Telehealth: Payer: Self-pay | Admitting: Internal Medicine

## 2010-07-09 ENCOUNTER — Encounter: Payer: Self-pay | Admitting: Internal Medicine

## 2010-07-13 ENCOUNTER — Telehealth: Payer: Self-pay | Admitting: Internal Medicine

## 2010-07-15 ENCOUNTER — Encounter: Payer: Self-pay | Admitting: Internal Medicine

## 2010-07-16 ENCOUNTER — Telehealth: Payer: Self-pay | Admitting: Internal Medicine

## 2010-07-16 ENCOUNTER — Ambulatory Visit: Payer: Self-pay | Admitting: Internal Medicine

## 2010-07-16 ENCOUNTER — Ambulatory Visit (HOSPITAL_BASED_OUTPATIENT_CLINIC_OR_DEPARTMENT_OTHER)
Admission: RE | Admit: 2010-07-16 | Discharge: 2010-07-16 | Payer: Self-pay | Source: Home / Self Care | Attending: Internal Medicine | Admitting: Internal Medicine

## 2010-07-19 ENCOUNTER — Encounter: Payer: Self-pay | Admitting: Internal Medicine

## 2010-07-22 ENCOUNTER — Encounter: Payer: Self-pay | Admitting: Internal Medicine

## 2010-07-23 ENCOUNTER — Ambulatory Visit: Payer: Self-pay | Admitting: Internal Medicine

## 2010-07-23 DIAGNOSIS — E781 Pure hyperglyceridemia: Secondary | ICD-10-CM

## 2010-07-26 ENCOUNTER — Telehealth: Payer: Self-pay | Admitting: Internal Medicine

## 2010-07-26 ENCOUNTER — Encounter: Payer: Self-pay | Admitting: Internal Medicine

## 2010-07-29 ENCOUNTER — Encounter: Payer: Self-pay | Admitting: Internal Medicine

## 2010-08-02 ENCOUNTER — Inpatient Hospital Stay (HOSPITAL_COMMUNITY)
Admission: EM | Admit: 2010-08-02 | Discharge: 2010-08-05 | Payer: Self-pay | Source: Home / Self Care | Attending: Internal Medicine | Admitting: Internal Medicine

## 2010-08-04 ENCOUNTER — Encounter (INDEPENDENT_AMBULATORY_CARE_PROVIDER_SITE_OTHER): Payer: Self-pay | Admitting: Internal Medicine

## 2010-08-05 ENCOUNTER — Encounter: Payer: Self-pay | Admitting: Internal Medicine

## 2010-08-11 ENCOUNTER — Telehealth: Payer: Self-pay | Admitting: Internal Medicine

## 2010-08-13 ENCOUNTER — Ambulatory Visit
Admission: RE | Admit: 2010-08-13 | Discharge: 2010-08-13 | Payer: Self-pay | Source: Home / Self Care | Attending: Internal Medicine | Admitting: Internal Medicine

## 2010-08-13 ENCOUNTER — Encounter: Payer: Self-pay | Admitting: Cardiology

## 2010-08-13 ENCOUNTER — Telehealth: Payer: Self-pay | Admitting: Internal Medicine

## 2010-08-13 LAB — CONVERTED CEMR LAB: INR: 1.76 — ABNORMAL HIGH (ref <1.50)

## 2010-08-16 ENCOUNTER — Telehealth: Payer: Self-pay | Admitting: Internal Medicine

## 2010-08-18 ENCOUNTER — Telehealth: Payer: Self-pay | Admitting: Internal Medicine

## 2010-08-19 ENCOUNTER — Encounter: Payer: Self-pay | Admitting: Internal Medicine

## 2010-08-23 ENCOUNTER — Telehealth: Payer: Self-pay | Admitting: Internal Medicine

## 2010-08-25 ENCOUNTER — Emergency Department (HOSPITAL_COMMUNITY)
Admission: EM | Admit: 2010-08-25 | Discharge: 2010-08-26 | Payer: Self-pay | Source: Home / Self Care | Admitting: Emergency Medicine

## 2010-08-26 ENCOUNTER — Telehealth: Payer: Self-pay | Admitting: Internal Medicine

## 2010-08-26 ENCOUNTER — Ambulatory Visit
Admission: RE | Admit: 2010-08-26 | Discharge: 2010-08-26 | Payer: Self-pay | Source: Home / Self Care | Attending: Internal Medicine | Admitting: Internal Medicine

## 2010-08-26 ENCOUNTER — Encounter: Payer: Self-pay | Admitting: Internal Medicine

## 2010-08-26 ENCOUNTER — Other Ambulatory Visit: Payer: Self-pay | Admitting: Internal Medicine

## 2010-08-27 LAB — CBC WITH DIFFERENTIAL/PLATELET
Basophils Absolute: 0 10*3/uL (ref 0.0–0.1)
Basophils Relative: 0.2 % (ref 0.0–3.0)
Eosinophils Absolute: 0.3 10*3/uL (ref 0.0–0.7)
Eosinophils Relative: 3.9 % (ref 0.0–5.0)
HCT: 33.3 % — ABNORMAL LOW (ref 36.0–46.0)
Hemoglobin: 11.1 g/dL — ABNORMAL LOW (ref 12.0–15.0)
Lymphocytes Relative: 11.5 % — ABNORMAL LOW (ref 12.0–46.0)
Lymphs Abs: 0.9 10*3/uL (ref 0.7–4.0)
MCHC: 33.4 g/dL (ref 30.0–36.0)
MCV: 83.2 fl (ref 78.0–100.0)
Monocytes Absolute: 0.5 10*3/uL (ref 0.1–1.0)
Monocytes Relative: 6.3 % (ref 3.0–12.0)
Neutro Abs: 6.2 10*3/uL (ref 1.4–7.7)
Neutrophils Relative %: 78.1 % — ABNORMAL HIGH (ref 43.0–77.0)
Platelets: 259 10*3/uL (ref 150.0–400.0)
RBC: 4 Mil/uL (ref 3.87–5.11)
RDW: 27.7 % — ABNORMAL HIGH (ref 11.5–14.6)
WBC: 7.9 10*3/uL (ref 4.5–10.5)

## 2010-08-27 LAB — PROTIME-INR
INR: 2.6 ratio — ABNORMAL HIGH (ref 0.8–1.0)
Prothrombin Time: 26.7 s — ABNORMAL HIGH (ref 10.2–12.4)

## 2010-08-29 ENCOUNTER — Encounter: Payer: Self-pay | Admitting: Internal Medicine

## 2010-08-30 ENCOUNTER — Telehealth: Payer: Self-pay | Admitting: Internal Medicine

## 2010-08-30 LAB — DIFFERENTIAL
Basophils Absolute: 0.1 10*3/uL (ref 0.0–0.1)
Eosinophils Absolute: 0.2 10*3/uL (ref 0.0–0.7)
Lymphocytes Relative: 14 % (ref 12–46)
Lymphs Abs: 1.1 10*3/uL (ref 0.7–4.0)
Monocytes Absolute: 0.7 10*3/uL (ref 0.1–1.0)
Neutrophils Relative %: 73 % (ref 43–77)

## 2010-08-30 LAB — POCT CARDIAC MARKERS
CKMB, poc: 1.6 ng/mL (ref 1.0–8.0)
Troponin i, poc: <0.05 ng/mL (ref 0.00–0.09)

## 2010-08-30 LAB — URINALYSIS, ROUTINE W REFLEX MICROSCOPIC
Ketones, ur: NEGATIVE mg/dL
Specific Gravity, Urine: 1.012 (ref 1.005–1.030)
Urine Glucose, Fasting: NEGATIVE mg/dL
pH: 7 (ref 5.0–8.0)

## 2010-08-30 LAB — CBC
HCT: 31.6 % — ABNORMAL LOW (ref 36.0–46.0)
Platelets: 289 10*3/uL (ref 150–400)
RDW: 24.4 % — ABNORMAL HIGH (ref 11.5–15.5)
WBC: 8 10*3/uL (ref 4.0–10.5)

## 2010-08-30 LAB — APTT: aPTT: 42 seconds — ABNORMAL HIGH (ref 24–37)

## 2010-08-30 LAB — POCT I-STAT, CHEM 8
BUN: 27 mg/dL — ABNORMAL HIGH (ref 6–23)
HCT: 33 % — ABNORMAL LOW (ref 36.0–46.0)
Potassium: 4 mEq/L (ref 3.5–5.1)
Sodium: 137 mEq/L (ref 135–145)
TCO2: 21 mmol/L (ref 0–100)

## 2010-08-30 LAB — PROTIME-INR
INR: 2.04 — ABNORMAL HIGH (ref 0.00–1.49)
Prothrombin Time: 23.2 seconds — ABNORMAL HIGH (ref 11.6–15.2)

## 2010-08-30 LAB — URINE MICROSCOPIC-ADD ON

## 2010-08-31 ENCOUNTER — Encounter: Payer: Self-pay | Admitting: Internal Medicine

## 2010-09-02 ENCOUNTER — Telehealth: Payer: Self-pay | Admitting: Internal Medicine

## 2010-09-06 ENCOUNTER — Encounter: Payer: Self-pay | Admitting: Internal Medicine

## 2010-09-07 ENCOUNTER — Telehealth: Payer: Self-pay | Admitting: Internal Medicine

## 2010-09-07 NOTE — Progress Notes (Signed)
Summary: Adult Day Care Status  Phone Note Other Incoming Call back at [phone]   Caller: [facility]  Summary of Call: THEY NEED THE MEDICAL REPORT FORM THAT DAUGHTER BROUGHT IN FOR ADULT DAY CARE  Initial call taken by: Roselle Locus,  November 12, 2009 9:21 AM  Follow-up for Phone Call        spoke with Harriett Sine  at the [facility] about the forms the patients daughter is requesting for completion, the forms could not be located in the office, forms will be faxed to office.  Forms have been recieved and patients daughter is aware that will be need review by Dr Artist Pais and will be completed on Monday.  Follow-up by: [name],  November 12, 2009 2:34 PM

## 2010-09-07 NOTE — Assessment & Plan Note (Signed)
Summary: npv/unspecified pvd   Visit Type:  new pt visit Primary Alida Greiner:  [name] DO  CC:  edema/feet....pt has severe PAD.  History of Present Illness: 77 yo WF with history of CAD, PVD s/p prior stenting of the left SFA in 2010, atrial fibrillation, severe Aortic stenosis, s/p mitral valve repair, breast cancer and vascular dementia here to see me today as a new PV patient. Her cardiac issues are followed by Dr. Jens Som. She has a non-healing wound of the left great toe. Her PV history has been extensive over the last 15 months. I have done an extensive review of the data available and tried to piece it together. She moved here from [place] last year to live with her daughter. It appears that she had a LE angiogram in [place] on 03/02/09. The operative report describes total occlusion of the left  SFA and popliteal with PTCA and placement of three Viabahn stents. PTCA of the left external iliac artery also performed. Her right lower ext had nonobstructive disease. She was seen in the [facility] in January 2011 and had non-invasive imaging with ABI of 0.92 on the right (PTA occluded) and ABI of 0.47 on the left (PTA occluded, Left SFA totally occluded).  Her left foot began to turn blue and she developed an ulcer on her left great toe. She was seen by Dr. Tollie Pizza of [facility] on 12/14/09 and he was concerned. An angiogram was arranged bu this report is not availbable in our records. Her daughter tells me that the angiogram showed total occlusion of the stents in the left SFA and that Dr. Garner Nash could not cross this lesion. She was then seen by Dr. Tressa Busman (?vascular surgeon) in [facility] and he did not feel that she was a good candidate for a fem-pop bypass given her age and comorbidities including dementia. She was told that an amputation may be necessary.   At this time, her left foot is still painful and the left great toenail has been removed. She has no  claudication. Her left foot has been reddish with some blue discoloration. The toe has been followed in her living facility with local wound care. No fever or chills.       Current Medications (verified): 1)  Coumadin 5 Mg Tabs (Warfarin Sodium) .... 3.5 Mg By Mouth Once Daily 2)  Cartia Xt 240 Mg Xr24h-Cap (Diltiazem Hcl Coated Beads) .... One By Mouth Once Daily 3)  Synthroid 100 Mcg Tabs (Levothyroxine Sodium) .... Take 1 Tablet By Mouth Every Morning 4)  Flovent Hfa 44 Mcg/act Aero (Fluticasone Propionate  Hfa) .... 2 Puffs By Mouth Once Daily 5)  Aspirin 81 Mg  Tbec (Aspirin) .... One By Mouth Every Day 6)  Namenda 5 Mg Tabs (Memantine Hcl) .... One By Mouth Two Times A Day 7)  Exelon 4.6 Mg/24hr Pt24 (Rivastigmine) .... Apply New Patch  Daily 8)  Lidoderm 5 % Ptch (Lidocaine) .... Apply Patch To Right Chest Wall 12 Hrs Per Day As Needed For Pain 9)  Tylenol 8 Hour 650 Mg Cr-Tabs (Acetaminophen) .... One Tabl By Mouth Every 8 Hours As Needed Pain 10)  Albuterol Sulfate (2.5 Mg/32ml) 0.083% Nebu (Albuterol Sulfate) .... Qid As Needed For Weheezing or Sob 11)  Melatonin 3 Mg Tabs (Melatonin) .... Take 1 Tab By Mouth At Bedtime As Needed 12)  Pedi-Dri 100000 Unit/gm Powd (Nystatin) .... Apply Three Times A Day Underneath Left Breast 13)  Fentanyl 12 Mcg/hr  Pt72 (Fentanyl) .... Apply New Patch Q 72 Hrs 14)  Losartan Potassium 25 Mg Tabs (Losartan Potassium) .... One By Mouth Once Daily 15)  Crestor 20 Mg Tabs (Rosuvastatin Calcium) .... One By Mouth Qd 16)  Doxycycline Hyclate 50 Mg Caps (Doxycycline Hyclate) .... One By Mouth Two Times A Day 17)  Triamcinolone Acetonide 0.1 % Crea (Triamcinolone Acetonide) .... Apply Two Times A Day To Left Side of Neck X 1 Week 18)  Lisinopril 5 Mg Tabs (Lisinopril) .Marland Kitchen.. 1 Tab Once Daily  Allergies: 1)  ! Percocet 2)  ! Morphine Sulfate (Morphine Sulfate) 3)  Aricept  Past History:  Past Medical History: CORONARY ARTERY DISEASE  (ICD-414.00) CEREBROVASCULAR ACCIDENT, HX OF (ICD-V12.50) ATRIAL FIBRILLATION (ICD-427.31)  HYPERLIPIDEMIA (ICD-272.4)  COPD (ICD-496)       EPISTAXIS, RECURRENT (ICD-784.7)    OSTEOPOROSIS (ICD-733.00)   HYPOTHYROIDISM (ICD-244.9)   PULMONARY EMBOLISM, HX OF (ICD-V12.51) DIVERTICULITIS, HX OF (ICD-V12.79) H/O peptic ulcer disease CERVICAL CANCER, HX OF (ICD-V10.41) BREAST CANCER, HX OF (ICD-V10.3) ASTHMA (ICD-493.90) ANEMIA-IRON DEFICIENCY (ICD-280.9) Blood transfusion  Hx of DVT Hx LBBB Severe MR; MV repair 7/10 Severe Aortic Stenosis Bradycardia, s/p pacemaker implant Severe PAD - left leg with occluded Viabahn stents x 3 left SFA.  Past Surgical History: Reviewed history from 04/21/2010 and no changes required. H/O thyroidectomy due to cancer Total hip replacement Hysterectomy   Mastectomy      Mitral valve repair    Left Eye Implant    S/P Pacemaker    Family History: Reviewed history from 04/21/2010 and no changes required. Family History Breast cancer 1st degree relative <50 Family History of Colon CA 1st degree relative <60 Family History Diabetes 1st degree relative Family History High cholesterol Family History Hypertension   Mother with unknown heart disease                Social History: Reviewed history from 04/21/2010 and no changes required. Retired Widow  1 daughter   Tobacco Use - Former.    Alcohol Use - no              Review of Systems  The patient denies fatigue, malaise, fever, weight gain/loss, vision loss, decreased hearing, hoarseness, chest pain, palpitations, shortness of breath, prolonged cough, wheezing, sleep apnea, coughing up blood, abdominal pain, blood in stool, nausea, vomiting, diarrhea, heartburn, incontinence, blood in urine, muscle weakness, joint pain, leg swelling, rash, skin lesions, headache, fainting, dizziness, depression, anxiety, enlarged lymph nodes, easy bruising or bleeding, and environmental allergies.          See HPI. Pain in left foot. Non-healing wound left great toe.   Vital Signs:  Patient profile:   77 year old female Height:      60 inches Weight:      108.12 pounds BMI:     21.19 Pulse rate:   74 / minute Pulse rhythm:   regular BP sitting:   108 / 60  (left arm) Cuff size:   regular  Vitals Entered By: Danielle Rankin, CMA (May 11, 2010 2:59 PM)  Physical Exam  General:  General: Thin, cachectic, NAD HEENT: OP clear, mucus membranes moist SKIN: warm, dry. Rubor over left foot Neuro: No focal deficits Musculoskeletal: Muscle strength 5/5 all ext Psychiatric: Mood and affect normal Neck: No JVD, no carotid bruits, no thyromegaly, no lymphadenopathy. Lungs:Clear bilaterally, no wheezes, rhonci, crackles CV: RRR Abdomen: soft, NT, ND, BS present Extremities: The left foot is red. The left toenail has been extracted. There is  granulation tissue over the nailbed. No erythema or expressable discharge. Non-palpable pulses bilateral DP/PT. Left foot cool to touch. No edema.     EKG  Procedure date:  05/12/2010  Findings:      V paced.   PPM Specifications Following MD:  Hillis Range, MD     PPM Vendor:  [employer]     PPM Model Number:  [id number]     PPM Serial Number:  [id number] PPM DOI:  07/12/2006     PPM Implanting MD:  NOT IMPLANTED HERE  Lead 1    Location: [facility]     DOI: 01/12/1998     Model #: [id number]     Serial #: [id number]     Status: active Lead 2    Location: RV     DOI: 01/12/1998     Model #: [id number]     Serial #: [id number]     Status: active   Indications:  AFIB   PPM Follow Up Pacer Dependent:  No      Episodes Coumadin:  Yes  Parameters Mode:  DDD     Lower Rate Limit:  70     Upper Rate Limit:  130 Paced AV Delay:  310     Sensed AV Delay:  310  Impression & Recommendations:  Problem # 1:  PVD (ICD-443.9) Pt seen today for second opinion on her severe left leg PAD. After review of records, it is found that she had 3 Viabahn stents placed in the left SFA  in July of 2010 for a CTO of the SFA. These stents occluded at some point between July 2010 and January 2011. Her left foot is now cold and there is a slowly healing wound of the left great toe. She has had a failed attempt at percutaneous procedure in the left superficial femoral artery  in May 2011 in [place]. She has been seen by a Vascular surgeon in [facility] who declined to offer a fem-pop bypass. I have had a long discussion with both the patient and her daughter today in regards to the severity of her PAD. Current options include palliation vs relook angiography to explore percutaneous options for revascularization. The patient is adamant that she does not want another angiogram or surgery but she also does not wish to consider the possibility of amputation of the left great toe or foot.   This is a complex, difficult situation. I would be willing to bring her in for lower extremity angiography and see if there is anything I can offer. She refuses at this time. She does agree to non-invasive imaging. We will start with dopplers of both lower extremities. I will see her back in two weeks. Until then, local wound care of the left great toe. Pt and daughter aware of the severity of the situation and possible need for amputation in the future if we cannot revascularize her toe.   Other Orders: EKG w/ Interpretation (93000) Arterial Duplex Lower Extremity (Arterial Duplex Low)  Patient Instructions: 1)  Your physician recommends that you schedule a follow-up appointment in: 2 weeks. 2)  Your physician recommends that you continue on your current medications as directed. Please refer to the Current Medication list given to you today. 3)  Your physician has requested that you have a lower extremity arterial duplex.  This test is an ultrasound of the arteries in the legs or arms.  It looks at arterial blood flow in the legs and arms.  Allow one hour for  Lower and Upper Arterial scans. There are no  restrictions or special instructions.

## 2010-09-07 NOTE — Miscellaneous (Signed)
Summary: Care Plan/[facility]  Care Plan/[facility]   Imported By: Lanelle Bal 09/03/2009 14:41:16  _____________________________________________________________________  External Attachment:    Type:   Image     Comment:   External Document

## 2010-09-07 NOTE — Progress Notes (Signed)
Summary: FL2  Phone Note Call from Patient   Caller: patient daughter Call For: Rita Miller  Summary of Call: spent Saturday at [facility].  She needs to go back to rehab.  They want an FL2 History and her last physical accessment. [facility] [phone] [facility]   Please let Rita Miller know when you fax this information [phone] Initial call taken by: Roselle Locus,  November 09, 2009 10:31 AM  Follow-up for Phone Call        darlene, please get FL2 and help find requested form Follow-up by: [name] DO,  November 09, 2009 12:56 PM  Additional Follow-up for Phone Call Additional follow up Details #1::        call placed to [facility] [phone], sent to voice mail for Lynelle Doctor, detailed voice message left requesting FL2 form regarding this patient. Fax number has been provided along with office phone number.  Patients daguhters Rita Miller was advised to contact the facility and request the forms that are needed to be completed. She states that she will contact them and see what she could do. Additional Follow-up by: [name],  November 11, 2009 2:18 PM    Additional Follow-up for Phone Call Additional follow up Details #2::    FL2 received from [facility] in the process of completing Follow-up by: [name],  November 12, 2009 12:49 PM  Additional Follow-up for Phone Call Additional follow up Details #3:: Details for Additional Follow-up Action Taken: daughter Rita Miller called stating she is in need to the paperwork for patient to be completed today. She was informed that Dr Artist Pais is out of the office until Monday, the forms would be forwarded to Sandford Craze for review, and she would be informed of the status update by the end of the day. Additional Follow-up by: [name],  November 13, 2009 11:35 AM   patients daughter Rita Miller call was returned and she was informed that Efraim Kaufmann has reviewed the forms in Dr Waynette Buttery absence, however she  is not able to sign off on the paperwork. She was informed the paperwork had been faxed however the information is incomplete. She was informed that Dr Artist Pais is out of the office and the paperwork would be reviewed and signed upon his return. She states that is unacceptable after waiting for a week. . and she would like to speak with Dr  covering for Dr Artist Pais. She was informed that I wuold speak with the office manager and she would be inforrmed of the situation and communicate to her from this point on Glendell Docker [facility]  November 13, 2009 12:33 PM

## 2010-09-07 NOTE — Miscellaneous (Signed)
Summary: PT Orders/[facility]  PT Orders/[facility]   Imported By: Lanelle Bal 09/10/2009 11:19:13  _____________________________________________________________________  External Attachment:    Type:   Image     Comment:   External Document

## 2010-09-07 NOTE — Miscellaneous (Signed)
Summary: Episode Summary/[facility]  Episode Summary/[facility]   Imported By: Lanelle Bal 11/25/2009 08:32:21  _____________________________________________________________________  External Attachment:    Type:   Image     Comment:   External Document

## 2010-09-07 NOTE — Assessment & Plan Note (Signed)
Summary: needs blood drawn & might need xray/dt   Vital Signs:  Patient profile:   77 year old female Weight:      108.75 pounds O2 Sat:      100 % on Room air Temp:     97.5 degrees F oral Pulse rate:   80 / minute Pulse rhythm:   irregular Resp:     18 per minute BP sitting:   118 / 50  (left arm) Cuff size:   regular  Vitals Entered By: [name] (April 21, 2010 8:33 AM)  O2 Flow:  Room air CC: follow-up visit Is Patient Diabetic? No Pain Assessment Patient in pain? yes     Location: foot/knee Intensity: 8 Comments c/o pain in her left knee and foot,   Primary Care Provider:  [name] DO  CC:  follow-up visit.  History of Present Illness: 77 y/o white female for f/u she continues to have intermittent left foot pain pt seen by vascular specialist at [facility] CT angio completed.  pt and daugther told revascularization not possible pt has tried to take pain meds like vicodin in the past but med use limited by subsequent confusion  she also continues to have right sided chest pain near pacemaker site  Preventive Screening-Counseling & Management  Alcohol-Tobacco     Smoking Status: quit  Allergies: 1)  ! Percocet 2)  ! Morphine Sulfate (Morphine Sulfate) 3)  Aricept  Past History:  Past Medical History: CORONARY ARTERY DISEASE (ICD-414.00) CEREBROVASCULAR ACCIDENT, HX OF (ICD-V12.50) ATRIAL FIBRILLATION (ICD-427.31)  HYPERLIPIDEMIA (ICD-272.4)  COPD (ICD-496)       EPISTAXIS, RECURRENT (ICD-784.7)    OSTEOPOROSIS (ICD-733.00)   HYPOTHYROIDISM (ICD-244.9)   PULMONARY EMBOLISM, HX OF (ICD-V12.51) DIVERTICULITIS, HX OF (ICD-V12.79) H/O peptic ulcer disease CERVICAL CANCER, HX OF (ICD-V10.41) BREAST CANCER, HX OF (ICD-V10.3) ASTHMA (ICD-493.90) ANEMIA-IRON DEFICIENCY (ICD-280.9) Blood transfusion  Hx of DVT Hx LBBB Severe MR; MV repair 7/10 Severe Aortic Stenosis Bradycardia, s/p pacemaker implant Severe PAD - left leg  Past  Surgical History: H/O thyroidectomy due to cancer Total hip replacement Hysterectomy   Mastectomy      Mitral valve repair    Left Eye Implant    S/P Pacemaker    Family History: Family History Breast cancer 1st degree relative <50 Family History of Colon CA 1st degree relative <60 Family History Diabetes 1st degree relative Family History High cholesterol Family History Hypertension   Mother with unknown heart disease                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    Alcohol Use - no              Review of Systems       The patient complains of chest pain.  The patient denies anorexia and weight loss.    Physical Exam  General:  alert and underweight appearing.   Mouth:  pharynx pink and moist.   Neck:  right chest wall tenderness Lungs:  normal respiratory effort, normal breath sounds, no crackles, and no wheezes.   Heart:  normal rate, regular rhythm, and no gallop.   Extremities:  No lower extremity edema cool left foot Skin:  chronic erythema of left foot. left toes with bluish discoloration    Impression & Recommendations:  Problem # 1:  ATRIAL FIBRILLATION (ICD-427.31)  Her updated medication list for this problem includes:    Coumadin 5 Mg Tabs (Warfarin  sodium) .Marland Kitchen... 3.5 mg by mouth once daily    Cartia Xt 240 Mg Xr24h-cap (Diltiazem hcl coated beads) ..... One by mouth once daily    Aspirin 81 Mg Tbec (Aspirin) ..... One by mouth every day  Orders: T-Basic Metabolic Panel [id number]) T-CBC No Diff ([id number]) T-Protime, Auto ([id number])  Problem # 2:  HYPOTHYROIDISM (ICD-244.9)  Her updated medication list for this problem includes:    Synthroid 100 Mcg Tabs (Levothyroxine sodium) .Marland Kitchen... Take 1 tablet by mouth every morning  Orders: T-TSH ([id number])  Problem # 3:  UNSPECIFIED PERIPHERAL VASCULAR DISEASE (ICD-443.9) pt continues to have left foot pain - likely from ischemia pt high surgical risk.   pt seen by vascular surgeon in [place] and he did not feel she is good revascularization candidate  If foot gets infected, amputation may be necessary.   Complete Medication List: 1)  Coumadin 5 Mg Tabs (Warfarin sodium) .... 3.5 mg by mouth once daily 2)  Cartia Xt 240 Mg Xr24h-cap (Diltiazem hcl coated beads) .... One by mouth once daily 3)  Synthroid 100 Mcg Tabs (Levothyroxine sodium) .... Take 1 tablet by mouth every morning 4)  Lisinopril 5 Mg Tabs (Lisinopril) .... One by mouth once daily 5)  Flovent Hfa 44 Mcg/act Aero (Fluticasone propionate  hfa) .... 2 puffs by mouth once daily 6)  Aspirin 81 Mg Tbec (Aspirin) .... One by mouth every day 7)  Namenda 5 Mg Tabs (Memantine hcl) .... One by mouth two times a day 8)  Exelon 4.6 Mg/24hr Pt24 (Rivastigmine) .... Apply new patch  daily 9)  Lipitor 20 Mg Tabs (Atorvastatin calcium) .... One by mouth once daily 10)  Lidoderm 5 % Ptch (Lidocaine) .... Apply patch to right chest wall 12 hrs per day as needed for pain 11)  Tylenol 8 Hour 650 Mg Cr-tabs (Acetaminophen) .... One tabl by mouth every 8 hours as needed pain 12)  Albuterol Sulfate (2.5 Mg/22ml) 0.083% Nebu (Albuterol sulfate) .... Qid as needed for weheezing or sob 13)  Melatonin 3 Mg Tabs (Melatonin) .... Take 1 tab by mouth at bedtime as needed 14)  Pedi-dri 100000 Unit/gm Powd (Nystatin) .... Apply three times a day underneath left breast 15)  Fentanyl 12 Mcg/hr Pt72 (Fentanyl) .... Apply new patch q 72 hrs  Patient Instructions: 1)  Please schedule a follow-up appointment in 2 weeks. Prescriptions: FENTANYL 25 MCG/HR PT72 (FENTANYL) apply new patch q 72 hrs  #10 x 0   Entered and Authorized by:   [name] DO   Signed by:   [name] DO on 04/21/2010   Method used:   Print then Give to Patient   RxID:   [id number] PEDI-DRI 100000 UNIT/GM POWD (NYSTATIN) apply three times a day underneath left breast  #1 month x 2   Entered and Authorized by:   [name] DO    Signed by:   [name] DO on 04/21/2010   Method used:   Electronically to        [facility] [phone]* (retail)       [address] [place], [place]  [address]       Ph: [phone] or [phone]       Fax: [phone]   RxID:   [id number]    Orders Added: 1)  T-Basic Metabolic Panel [id number] 2)  T-CBC No Diff [[id number]] 3)  T-Protime, Auto [[id number]] 4)  T-TSH [[id number]] 5)  Est. Patient Level  IV [id number]   Current Allergies (reviewed today): ! PERCOCET ! MORPHINE SULFATE (MORPHINE SULFATE) ARICEPT

## 2010-09-07 NOTE — Assessment & Plan Note (Signed)
Summary: per pt daughter call pt is having alot of pain at pacer site/lg   Visit Type:  Follow-up Primary Provider:  [name] DO   History of Present Illness: The patient presents today to establish EP care for her pacemaker, which was implanted in [place] 1999 for bradycardia and afib.  She reports occasional tenderness around the device, without drainage erythema or fevers.   The patient denies symptoms of palpitations, chest pain, shortness of breath, orthopnea, PND, lower extremity edema, dizziness, presyncope, syncope, or neurologic sequela. The patient is tolerating medications without difficulties and is otherwise without complaint today.   Current Medications (verified): 1)  Coumadin 4 Mg Tabs (Warfarin Sodium) .... One By Mouth Once Daily 2)  Cardizem 120 Mg Tabs (Diltiazem Hcl) .... Take 1 Tablet By Mouth Once A Day 3)  Synthroid 100 Mcg Tabs (Levothyroxine Sodium) .... Take 1 Tablet By Mouth Every Morning 4)  Quinapril Hcl 10 Mg Tabs (Quinapril Hcl) .... Take 1 Tablet By Mouth Once A Day 5)  Flovent Hfa 44 Mcg/act Aero (Fluticasone Propionate  Hfa) .... 2 Puffs By Mouth Once Daily 6)  Aspirin 81 Mg  Tbec (Aspirin) .... One By Mouth Every Day 7)  Mirtazapine 15 Mg Tabs (Mirtazapine) .... One  Tab By Mouth At Bedtime 8)  Namenda 5 Mg Tabs (Memantine Hcl) .... One By Mouth Two Times A Day 9)  Lidoderm 5 % Ptch (Lidocaine) .... Uad  Allergies: 1)  ! Percocet 2)  ! Morphine Sulfate (Morphine Sulfate) 3)  Aricept  Past History:  Past Medical History: CORONARY ARTERY DISEASE (ICD-414.00) CEREBROVASCULAR ACCIDENT, HX OF (ICD-V12.50) ATRIAL FIBRILLATION (ICD-427.31)  HYPERLIPIDEMIA (ICD-272.4) COPD (ICD-496)   EPISTAXIS, RECURRENT (ICD-784.7)  OSTEOPOROSIS (ICD-733.00)  HYPOTHYROIDISM (ICD-244.9) PULMONARY EMBOLISM, HX OF (ICD-V12.51) DIVERTICULITIS, HX OF (ICD-V12.79) H/O peptic ulcer disease CERVICAL CANCER, HX OF (ICD-V10.41) BREAST CANCER, HX OF (ICD-V10.3) ASTHMA  (ICD-493.90) ANEMIA-IRON DEFICIENCY (ICD-280.9) Blood transfusion  Hx of DVT Hx LBBB Severe MR; MV repair 7/10 Severe Aortic Stenosis Bradycardia, s/p pacemaker implant  Past Surgical History: Reviewed history from 09/03/2009 and no changes required. H/O thyroidectomy due to cancer Total hip replacement Hysterectomy  Maectomy  Mitral valve repair  Left Eye Implant    S/P Pacemaker   Family History: Reviewed history from 09/03/2009 and no changes required. Family History Breast cancer 1st degree relative <50 Family History of Colon CA 1st degree relative <60 Family History Diabetes 1st degree relative Family History High cholesterol Family History Hypertension   Mother with unknown heart disease       Social History: Reviewed history from 09/03/2009 and no changes required. Retired Widow  1 daughter 34  Tobacco Use - Former.   Alcohol Use - no      Review of Systems       All systems are reviewed and negative except as listed in the HPI.   Vital Signs:  Patient profile:   77 year old female Pulse rate:   68 / minute BP sitting:   120 / 70  (left arm)  Vitals Entered By: Laurance Flatten CMA (September 11, 2009 10:00 AM)  Physical Exam  General:  alert, very thin  Head:  normocephalic and atraumatic Eyes:  PERRLA/EOM intact; conjunctiva and lids normal. Nose:  no deformity, discharge, inflammation, or lesions Mouth:  Teeth, gums and palate normal. Oral mucosa normal. Neck:  supple and no masses.   Chest Wall:  pacemaker site is well healed.  Skin is very thin over the device and tender  to palp Lungs:  normal respiratory effort and normal breath sounds.   Heart:  normal rate, regular rhythm, and no gallop.   3/6 SEM LUSB, late peaking Abdomen:  Bowel sounds positive; abdomen soft and non-tender without masses, organomegaly, or hernias noted. No hepatosplenomegaly. Msk:  Back normal, normal gait. Muscle strength and tone normal. Pulses:  pulses normal in all 4  extremities Extremities:  No clubbing or cyanosis. Neurologic:  confused Skin:  Intact without lesions or rashes. Cervical Nodes:  no significant adenopathy Psych:  flat affect   PPM Specifications Following MD:  Hillis Range, MD     PPM Vendor:  [employer]     PPM Model Number:  [id number]     PPM Serial Number:  [id number] PPM DOI:  07/12/2006     PPM Implanting MD:  NOT IMPLANTED HERE  Lead 1    Location: RA     DOI: 01/12/1998     Model #: [id number]     Serial #: [id number]     Status: active Lead 2    Location: RV     DOI: 01/12/1998     Model #: [id number]     Serial #: [id number]     Status: active   Indications:  AFIB   PPM Follow Up Remote Check?  No Battery Voltage:  2.78 V     Battery Est. Longevity:  5 years     Pacer Dependent:  No       PPM Device Measurements Atrium  Amplitude: 0.7 mV, Impedance: 903 ohms,  Right Ventricle  Amplitude: 5.6 mV, Impedance: 2363 ohms, Threshold: 1.125 V at 0.4 msec  Episodes MS Episodes:  2254     Percent Mode Switch:  81.7%     Coumadin:  Yes Ventricular High Rate:  0     Atrial Pacing:  49.3%     Ventricular Pacing:  51.8%  Parameters Mode:  DDD     Lower Rate Limit:  70     Upper Rate Limit:  130 Paced AV Delay:  350     Sensed AV Delay:  340 Next Remote Date:  12/09/2009     Next Cardiology Appt Due:  09/08/2010 Tech Comments:  No parameter changes.  A-fib, + coumadin.  The patient c/o soreness at the site.  It appears well healed but is superficial probably due to the fact that she is small framed woman.  The skin around the device is intact.  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 Johney Frame. Altha Harm, LPN  September 11, 2009 10:13 AM  MD Comments:  [phone] V lead is a high impedance lead.  Impedance is stable.  Impression & Recommendations:  Problem # 1:  PACEMAKER, PERMANENT (ICD-V45.01) Normal pacemaker function for bradycardia. NO changes though site is tender, pocket looks ok.  No  evidence of infection  Problem # 2:  ATRIAL FIBRILLATION (ICD-427.31) stable continue coumadin for stroke prevention.  The following medications were removed from the medication list:    Coumadin 2 Mg Tabs (Warfarin sodium) .Marland Kitchen... 1 tab on saturday, sunday, tuesday, thursday Her updated medication list for this problem includes:    Coumadin 4 Mg Tabs (Warfarin sodium) ..... One by mouth once daily    Aspirin 81 Mg Tbec (Aspirin) ..... One by mouth every day  Problem # 3:  AORTIC VALVE DISORDERS (ICD-424.1) Severe AS, but stable symptoms Dr Jens Som to follow  Her updated medication list for this  problem includes:    Quinapril Hcl 10 Mg Tabs (Quinapril hcl) .Marland Kitchen... Take 1 tablet by mouth once a day  Problem # 4:  TOBACCO USE, QUIT (ICD-V15.82) does not smoke  Patient Instructions: 1)  Your physician recommends that you schedule a follow-up appointment in: Dec 2011 with Dr Johney Frame

## 2010-09-07 NOTE — Progress Notes (Signed)
  Phone Note Call from Patient   Caller: Daughter Call For: [name] DO Details for Reason: medical question for Dr.Kaprice Kage Summary of Call: Pt's daughter- Dossie Arbour was wondering if you are still planning on testing her mother Ms.Husser for Dementia? Daughter states that her mom was talking crazy lastnight. Call her back at [phone] to discuss this issue. Thank you, [name] Initial call taken by: [name],  August 20, 2009 1:14 PM  Follow-up for Phone Call        we will test for dementia at next OV Follow-up by: [name] DO,  August 20, 2009 5:43 PM  Additional Follow-up for Phone Call Additional follow up Details #1::        informed daughter that you will test for Dementia at next ov. Additional Follow-up by: [name],  August 21, 2009 9:01 AM

## 2010-09-07 NOTE — Assessment & Plan Note (Signed)
Summary: BREAST PAIN/DK   Vital Signs:  Patient profile:   77 year old female Height:      60 inches Weight:      111.50 pounds BMI:     21.85 O2 Sat:      98 % on Room air Temp:     98.0 degrees F oral Pulse rate:   74 / minute Resp:     22 per minute BP sitting:   122 / 64  (left arm) Cuff size:   regular  Vitals Entered By: [name] (July 07, 2010 10:28 AM)  O2 Flow:  Room air CC: Left Breast Pain Is Patient Diabetic? No Pain Assessment Patient in pain? yes     Location: left breast Intensity: 9 Type: aching Onset of pain  Intermittent   Primary Care Provider:  [name] DO  CC:  Left Breast Pain.  History of Present Illness: 77 y/o white female for hosp f/u pt hosp in nov for COPD  exacerbation pt also noted to have worsening anemia secondary to nose bleeds pt transfused.  nose bleeding controlled. not cauterized during hospitalization  she also c/o left breast pain.   left breast is tender no mass  left foot severe PVD with chronic cellulitus - no significant change.  minimal drainage  Allergies: 1)  ! Percocet 2)  ! Morphine Sulfate (Morphine Sulfate) 3)  Aricept  Past History:  Past Medical History: CORONARY ARTERY DISEASE (ICD-414.00) CEREBROVASCULAR ACCIDENT, HX OF (ICD-V12.50) ATRIAL FIBRILLATION (ICD-427.31)  HYPERLIPIDEMIA (ICD-272.4)   COPD (ICD-496)         EPISTAXIS, RECURRENT (ICD-784.7)    OSTEOPOROSIS (ICD-733.00)   HYPOTHYROIDISM (ICD-244.9)   PULMONARY EMBOLISM, HX OF (ICD-V12.51) DIVERTICULITIS, HX OF (ICD-V12.79) H/O peptic ulcer disease CERVICAL CANCER, HX OF (ICD-V10.41) BREAST CANCER, HX OF (ICD-V10.3) ASTHMA (ICD-493.90) ANEMIA-IRON DEFICIENCY (ICD-280.9) Blood transfusion  Hx of DVT Hx LBBB Severe MR; MV repair 7/10 Severe Aortic Stenosis Bradycardia, s/p pacemaker implant Severe PAD - left leg with occluded Viabahn stents x 3 left SFA.  Past Surgical History: H/O thyroidectomy due to  cancer Total hip replacement Hysterectomy    Mastectomy       Mitral valve repair    Left Eye Implant    S/P Pacemaker      Review of Systems  The patient denies fever, chest pain, and prolonged cough.    Physical Exam  General:  alert.   Head:  normocephalic and atraumatic.   Eyes:  pupils equal, pupils round, and pupils reactive to light.   Nose:  right nares - mucosal friability and active bleeding or clots.   Mouth:  pharynx pink and moist.   Breasts:  no masses.  left lateral breast tenderness.  no skin changes Lungs:  normal respiratory effort and normal breath sounds.   Heart:  normal rate and regular rhythm.  SEM II/VI RSB Abdomen:  soft, non-tender, and normal bowel sounds.   Extremities:  No lower extremity edema  Neurologic:  cranial nerves II-XII intact.   Psych:  normally interactive, good eye contact, not anxious appearing, and not depressed appearing.     Impression & Recommendations:  Problem # 1:  EPISTAXIS, RECURRENT (ICD-784.7) chronic right sided epistaxix refer to ENT for cauterization  Orders: ENT Referral (ENT)  Problem # 2:  ANEMIA-IRON DEFICIENCY (ICD-280.9) blood loss from epistaxis vs GI bleed pt to complete hemoccult cards  Orders: T-CBC No Diff ([id number])  Problem # 3:  CELLULITIS, FOOT (ICD-682.7) Assessment: Unchanged  The following  medications were removed from the medication list:    Levaquin 250 Mg Tabs (Levofloxacin) ..... One by mouth once daily  Problem # 4:  PNEUMONIA (ICD-486) Assessment: Improved arrange f/u CXR hosp cxr poor quality question component of CHF check BNP  The following medications were removed from the medication list:    Levaquin 250 Mg Tabs (Levofloxacin) ..... One by mouth once daily  Orders: T-2 View CXR, Same Day (71020.5TC)  Problem # 5:  BREAST PAIN, LEFT (ICD-611.71) Trial of vitamin E  Complete Medication List: 1)  Coumadin 3 Mg Tabs (Warfarin sodium) .... One by mouth once daily 2)   Cartia Xt 240 Mg Xr24h-cap (Diltiazem hcl coated beads) .... One by mouth once daily 3)  Synthroid 88 Mcg Tabs (Levothyroxine sodium) .... One by mouth once daily 4)  Flovent Hfa 44 Mcg/act Aero (Fluticasone propionate  hfa) .... 2 puffs by mouth once daily 5)  Aspirin 81 Mg Tbec (Aspirin) .... One by mouth every day 6)  Namenda 5 Mg Tabs (Memantine hcl) .... One by mouth two times a day 7)  Exelon 4.6 Mg/24hr Pt24 (Rivastigmine) .... Apply new patch  daily 8)  Lidoderm 5 % Ptch (Lidocaine) .... Apply patch to right chest wall 12 hrs per day as needed for pain 9)  Albuterol Sulfate (2.5 Mg/52ml) 0.083% Nebu (Albuterol sulfate) .... Qid as needed for weheezing or sob 10)  Melatonin 3 Mg Tabs (Melatonin) .... Take 1 tab by mouth at bedtime as needed 11)  Pedi-dri 100000 Unit/gm Powd (Nystatin) .... Apply three times a day underneath left breast 12)  Triamcinolone Acetonide 0.1 % Crea (Triamcinolone acetonide) .... Apply two times a day to left side of neck x 1 week 13)  Hydrocodone-acetaminophen 2.5-500 Mg Tabs (Hydrocodone-acetaminophen) .... 1/2 tab by mouth two times a day as needed 14)  Vicodin 5-500 Mg Tabs (Hydrocodone-acetaminophen) .... As directed 15)  Tylenol 325 Mg Tabs (Acetaminophen) .... One tablet by mouth every 6 hours as needed pain 16)  Lisinopril 5 Mg Tabs (Lisinopril) .... Take one tablet by mouth daily 17)  Lipitor 20 Mg Tabs (Atorvastatin calcium) .... Take 1 tablet by mouth once a day 18)  Fentanyl 12 Mcg/hr Pt72 (Fentanyl) .... Apply one patch topicall every 72 hours  Other Orders: T-TSH [phone]) T-BNP  (B Natriuretic Peptide) [phone]) T-Basic Metabolic Panel ([id number])  Patient Instructions: 1)  Please schedule a follow-up appointment in 1 month.   Orders Added: 1)  T-CBC No Diff [[id number]] 2)  T-TSH [[id number]] 3)  T-2 View CXR, Same Day [71020.5TC] 4)  T-BNP  (B Natriuretic Peptide) [[id number]] 5)  T-Basic Metabolic Panel [[id number]] 6)   ENT Referral [ENT] 7)  Est. Patient Level IV [44315]   Immunization History:  Influenza Immunization History:    Influenza:  historical (05/18/2010)   Immunization History:  Influenza Immunization History:    Influenza:  Historical (05/18/2010)  Current Allergies (reviewed today): ! PERCOCET ! MORPHINE SULFATE (MORPHINE SULFATE) ARICEPT

## 2010-09-07 NOTE — Medication Information (Signed)
Summary: Hydrocodone/[facility]  Hydrocodone/[facility]   Imported By: Lanelle Bal 07/16/2010 13:09:32  _____________________________________________________________________  External Attachment:    Type:   Image     Comment:   External Document

## 2010-09-07 NOTE — Progress Notes (Signed)
Summary: Lab Results  Phone Note Outgoing Call   Summary of Call: call pt - INR is 2.19.  Keep taking 1/2 of 4 mg of coumadin.  repeat INR on Monday Initial call taken by: [name] DO,  November 06, 2009 8:15 AM  Follow-up for Phone Call        call placed to patients daughter [name] @ [phone] she was advised per Dr Artist Pais instructions Follow-up by: [name],  November 06, 2009 8:44 AM

## 2010-09-07 NOTE — Miscellaneous (Signed)
Summary: Med & Physician Sherren Kerns  Med & Physician Orders/[place]   Imported By: Lanelle Bal 05/20/2010 12:47:44  _____________________________________________________________________  External Attachment:    Type:   Image     Comment:   External Document

## 2010-09-07 NOTE — Progress Notes (Signed)
Summary: circulation concerns?  Phone Note From Other Clinic   Caller: Melissa @ [facility] [place]  [phone] Call For: Dr Artist Pais Summary of Call: Efraim Kaufmann called and stated that pt's feet are showing signs of necrosis. Pedal pulses are weak bilaterally. Bottom of left foot around the palm area and tip toes are purple. Tips of toes are cold, swollen and red. Pt's next appt is scheduled for 08/05/10.  Do you want to see her earlier for these symptoms.  Please advise. Nicki Guadalajara Fergerson CMA Duncan Dull)  July 09, 2010 3:21 PM   Follow-up for Phone Call        her feet have had that appearance chronically for months.  If pt having more pain in left foot than usual,  I suggest OV Follow-up by: [name] DO,  July 09, 2010 4:08 PM  Additional Follow-up for Phone Call Additional follow up Details #1::        Notified Melissa. She states they will let us know if pt develops more pain than usual in the left foot. Nicki Guadalajara Fergerson CMA Duncan Dull)  July 09, 2010 4:29 PM

## 2010-09-07 NOTE — Assessment & Plan Note (Signed)
Summary: per Rita Miller and check coumaden/mhf   Vital Signs:  Patient profile:   77 year old female Height:      60 inches Weight:      107 pounds BMI:     20.97 O2 Sat:      98 % on Room air Temp:     98.6 degrees F oral Pulse rate:   82 / minute Pulse rhythm:   irregular Resp:     22 per minute BP sitting:   122 / 58  (left arm) Cuff size:   regular  Vitals Entered By: Rita Miller CMA (February 26, 2010 1:33 PM)  O2 Flow:  Room air CC: Rm 2- Follow up Is Patient Diabetic? No Comments requesting order for nebulizer and oxygen to assisted living facility   Primary Care Provider:  [name] DO  CC:  Rm 2- Follow up.  History of Present Illness: 77 y/o white female for hosp f/u pt evaluated at [facility] she was diagnosed pleurisy,  treated with prednisone her pain has always been on right side near prev pacemaker site  she denies cough or shortness of breath  a fib- reviewed [facility] medication record.  she has been taking 5 mg of coumadin denies abnormal bleeding    Preventive Screening-Counseling & Management  Alcohol-Tobacco     Smoking Status: quit  Allergies: 1)  ! Percocet 2)  ! Morphine Sulfate (Morphine Sulfate) 3)  Aricept  Past History:  Past Medical History: CORONARY ARTERY DISEASE (ICD-414.00) CEREBROVASCULAR ACCIDENT, HX OF (ICD-V12.50) ATRIAL FIBRILLATION (ICD-427.31)  HYPERLIPIDEMIA (ICD-272.4)  COPD (ICD-496)      EPISTAXIS, RECURRENT (ICD-784.7)    OSTEOPOROSIS (ICD-733.00)   HYPOTHYROIDISM (ICD-244.9)   PULMONARY EMBOLISM, HX OF (ICD-V12.51) DIVERTICULITIS, HX OF (ICD-V12.79) H/O peptic ulcer disease CERVICAL CANCER, HX OF (ICD-V10.41) BREAST CANCER, HX OF (ICD-V10.3) ASTHMA (ICD-493.90) ANEMIA-IRON DEFICIENCY (ICD-280.9) Blood transfusion  Hx of DVT Hx LBBB Severe MR; MV repair 7/10 Severe Aortic Stenosis Bradycardia, s/p pacemaker implant Severe PAD - left leg  Family History: Family History Breast cancer 1st degree  relative <50 Family History of Colon CA 1st degree relative <60 Family History Diabetes 1st degree relative Family History High cholesterol Family History Hypertension   Mother with unknown heart disease               Social History: Retired Widow  1 daughter  Tobacco Use - Former.    Alcohol Use - no              Physical Exam  General:  alert and underweight appearing.   Head:  normocephalic and atraumatic.   Eyes:  pupils equal, pupils round, and pupils reactive to light.   Lungs:  normal respiratory effort, normal breath sounds, no crackles, and no wheezes.   Heart:  normal rate, regular rhythm, and no gallop.   Abdomen:  soft, non-tender, and normal bowel sounds.   Extremities:  No lower extremity edema Neurologic:  cranial nerves II-XII intact and gait normal.     Impression & Recommendations:  Problem # 1:  CHEST WALL PAIN, ANTERIOR (ICD-786.52) 77 y/o with chronic right sided chest wall pain.  I doubt pleurisy.  pain likely related to prev pacemaker site. trial of lidoderm patch  Her updated medication list for this problem includes:    Aspirin 81 Mg Tbec (Aspirin) ..... One by mouth every day  Problem # 2:  COPD (ICD-496) no wheezing on exam.  dc nebulizer treatments  Her updated medication list  for this problem includes:    Flovent Hfa 44 Mcg/act Aero (Fluticasone propionate  hfa) .Marland Kitchen... 2 puffs by mouth once daily  Problem # 3:  ATRIAL FIBRILLATION (ICD-427.31) repeat INR  Her updated medication list for this problem includes:    Coumadin 5 Mg Tabs (Warfarin sodium) .Marland Kitchen... Take 1 tablet by mouth once a day    Cartia Xt 240 Mg Xr24h-cap (Diltiazem hcl coated beads) ..... One by mouth once daily    Aspirin 81 Mg Tbec (Aspirin) ..... One by mouth every day  Complete Medication List: 1)  Coumadin 5 Mg Tabs (Warfarin sodium) .... Take 1 tablet by mouth once a day 2)  Cartia Xt 240 Mg Xr24h-cap (Diltiazem hcl coated beads) .... One by mouth once daily 3)   Synthroid 100 Mcg Tabs (Levothyroxine sodium) .... Take 1 tablet by mouth every morning 4)  Lisinopril 5 Mg Tabs (Lisinopril) .... One by mouth once daily 5)  Flovent Hfa 44 Mcg/act Aero (Fluticasone propionate  hfa) .... 2 puffs by mouth once daily 6)  Aspirin 81 Mg Tbec (Aspirin) .... One by mouth every day 7)  Namenda 5 Mg Tabs (Memantine hcl) .... One by mouth two times a day 8)  Exelon 4.6 Mg/24hr Pt24 (Rivastigmine) .... Apply new patch  daily 9)  Lipitor 20 Mg Tabs (Atorvastatin calcium) .... One by mouth once daily 10)  Lidoderm 5 % Ptch (Lidocaine) .... Apply patch to right chest wall 12 hrs per day as needed for pain 11)  Tylenol 8 Hour 650 Mg Cr-tabs (Acetaminophen) .... One tabl by mouth every 8 hours as needed pain  Other Orders: T-Protime, Auto ([id number])  Patient Instructions: 1)  Please schedule a follow-up appointment in 1 month. 2)  BMP prior to visit, ICD-9: 401.9 3)  Hepatic Panel prior to visit:  272.4 4)  Lipid Panel prior to visit, ICD-9: 272.4 5)  TSH:   272.4 6)  Please return for lab work one 2 days before your next appointment.  Prescriptions: LIDODERM 5 % PTCH (LIDOCAINE) apply patch to right chest wall 12 hrs per day as needed for pain  #30 x 1   Entered and Authorized by:   [name] DO   Signed by:   [name] DO on 02/26/2010   Method used:   Electronically to        [facility] [phone]* (retail)       [address] [place], [place]  [address]       Ph: [phone] or [phone]       Fax: [phone]   RxID:   [id number]   Current Allergies (reviewed today): ! PERCOCET ! MORPHINE SULFATE (MORPHINE SULFATE) ARICEPT

## 2010-09-07 NOTE — Progress Notes (Signed)
  Phone Note Outgoing Call   Summary of Call: she needs f/u appt within 1 week of vascular procedure.  I would also like office note from vascular specialist before OV Initial call taken by: [name] DO,  Dec 21, 2009 1:22 PM  Follow-up for Phone Call        Appt   May 27th  @  10am   spoke with [name]  ; Procedure @ [facility]   May 20th Follow-up by: Darral Dash,  Dec 22, 2009 9:37 AM  Additional Follow-up for Phone Call Additional follow up Details #1::        Also, [name] wanted to know when pt should restart her Coumadin. I advised her to check with Dr. Reuel Boom that would be performing surgery to determine when pt. should restart.  Mervin Kung CMA  Dec 22, 2009 9:51 AM

## 2010-09-07 NOTE — Assessment & Plan Note (Signed)
Summary: 1 WEEK FOLLOW UP/MHF   Vital Signs:  Patient profile:   77 year old female Weight:      107 pounds BMI:     20.97 O2 Sat:      99 % on Room air Temp:     97.8 degrees F oral Pulse rate:   79 / minute Pulse rhythm:   regular Resp:     20 per minute BP sitting:   124 / 54  (left arm) Cuff size:   regular  Vitals Entered By: [name] (May 14, 2010 10:05 AM)  O2 Flow:  Room air CC: 1 Week follow up Is Patient Diabetic? No Pain Assessment Patient in pain? no      Comments requesting for pain for her foot & Knee, toes are still bothering her    Primary Care Provider:  DThomos Lemons DO  CC:  1 Week follow up.  History of Present Illness: 77 y/o with hx of severe PVD, aortic stenosis, copd for f/u left foot not any better still driaining clear liquid chronic pain  Preventive Screening-Counseling & Management  Alcohol-Tobacco     Smoking Status: quit  Allergies: 1)  ! Percocet 2)  ! Morphine Sulfate (Morphine Sulfate) 3)  Aricept  Past History:  Past Medical History: CORONARY ARTERY DISEASE (ICD-414.00) CEREBROVASCULAR ACCIDENT, HX OF (ICD-V12.50) ATRIAL FIBRILLATION (ICD-427.31)  HYPERLIPIDEMIA (ICD-272.4)  COPD (ICD-496)        EPISTAXIS, RECURRENT (ICD-784.7)    OSTEOPOROSIS (ICD-733.00)   HYPOTHYROIDISM (ICD-244.9)   PULMONARY EMBOLISM, HX OF (ICD-V12.51) DIVERTICULITIS, HX OF (ICD-V12.79) H/O peptic ulcer disease CERVICAL CANCER, HX OF (ICD-V10.41) BREAST CANCER, HX OF (ICD-V10.3) ASTHMA (ICD-493.90) ANEMIA-IRON DEFICIENCY (ICD-280.9) Blood transfusion  Hx of DVT Hx LBBB Severe MR; MV repair 7/10 Severe Aortic Stenosis Bradycardia, s/p pacemaker implant Severe PAD - left leg with occluded Viabahn stents x 3 left SFA.  Past Surgical History: H/O thyroidectomy due to cancer Total hip replacement Hysterectomy    Mastectomy       Mitral valve repair    Left Eye Implant    S/P Pacemaker    Family History: Family  History Breast cancer 1st degree relative <50 Family History of Colon CA 1st degree relative <60 Family History Diabetes 1st degree relative Family History High cholesterol Family History Hypertension   Mother with unknown heart disease                  Social History: Retired Widow  1 daughter    Tobacco Use - Former.    Alcohol Use - no               Physical Exam  General:  alert, well-developed, and well-nourished.   Lungs:  normal respiratory effort and normal breath sounds.   Heart:  normal rate and regular rhythm.  SEM II/VI Extremities:  No lower extremity edema Skin:  chronic erythema of left foot. left toes with bluish discoloration  serous discharge from left great toe   Impression & Recommendations:  Problem # 1:  CELLULITIS, FOOT (ICD-682.7) Assessment Deteriorated rule out osteo. change to levaquin  The following medications were removed from the medication list:    Doxycycline Hyclate 50 Mg Caps (Doxycycline hyclate) ..... One by mouth two times a day Her updated medication list for this problem includes:    Levaquin 250 Mg Tabs (Levofloxacin) ..... One by mouth once daily  Orders: T-CBC w/Diff ([id number]) T-Sed Rate (Automated) [phone]) Radiology Referral (Radiology)  Problem # 2:  COUMADIN THERAPY (ICD-V58.61)  Complete Medication List: 1)  Coumadin 5 Mg Tabs (Warfarin sodium) .... 3.5 mg by mouth once daily 2)  Cartia Xt 240 Mg Xr24h-cap (Diltiazem hcl coated beads) .... One by mouth once daily 3)  Synthroid 100 Mcg Tabs (Levothyroxine sodium) .... Take 1 tablet by mouth every morning 4)  Flovent Hfa 44 Mcg/act Aero (Fluticasone propionate  hfa) .... 2 puffs by mouth once daily 5)  Aspirin 81 Mg Tbec (Aspirin) .... One by mouth every day 6)  Namenda 5 Mg Tabs (Memantine hcl) .... One by mouth two times a day 7)  Exelon 4.6 Mg/24hr Pt24 (Rivastigmine) .... Apply new patch  daily 8)  Lidoderm 5 % Ptch (Lidocaine) .... Apply patch to right  chest wall 12 hrs per day as needed for pain 9)  Tylenol 8 Hour 650 Mg Cr-tabs (Acetaminophen) .... One tabl by mouth every 8 hours as needed pain 10)  Albuterol Sulfate (2.5 Mg/31ml) 0.083% Nebu (Albuterol sulfate) .... Qid as needed for weheezing or sob 11)  Melatonin 3 Mg Tabs (Melatonin) .... Take 1 tab by mouth at bedtime as needed 12)  Pedi-dri 100000 Unit/gm Powd (Nystatin) .... Apply three times a day underneath left breast 13)  Fentanyl 12 Mcg/hr Pt72 (Fentanyl) .... Apply new patch q 72 hrs 14)  Losartan Potassium 25 Mg Tabs (Losartan potassium) .... One by mouth once daily 15)  Crestor 20 Mg Tabs (Rosuvastatin calcium) .... One by mouth qd 16)  Triamcinolone Acetonide 0.1 % Crea (Triamcinolone acetonide) .... Apply two times a day to left side of neck x 1 week 17)  Lisinopril 5 Mg Tabs (Lisinopril) .Marland Kitchen.. 1 tab once daily 18)  Levaquin 250 Mg Tabs (Levofloxacin) .... One by mouth once daily  Other Orders: T-Protime, Auto ([id number]) Prescriptions: FENTANYL 12 MCG/HR PT72 (FENTANYL) apply new patch q 72 hrs  #10 x 0   Entered and Authorized by:   [name] DO   Signed by:   [name] DO on 05/14/2010   Method used:   Print then Give to Patient   RxID:   [id number] LEVAQUIN 250 MG TABS (LEVOFLOXACIN) one by mouth once daily  #14 x 0   Entered and Authorized by:   [name] DO   Signed by:   [name] DO on 05/14/2010   Method used:   Print then Give to Patient   RxID:   [id number]   Current Allergies (reviewed today): ! PERCOCET ! MORPHINE SULFATE (MORPHINE SULFATE) ARICEPT

## 2010-09-07 NOTE — Miscellaneous (Signed)
Summary: OT Order/[facility]  OT Order/[facility]   Imported By: Lanelle Bal 09/28/2009 08:55:54  _____________________________________________________________________  External Attachment:    Type:   Image     Comment:   External Document

## 2010-09-07 NOTE — Progress Notes (Signed)
Summary: PAINFUL BREAST   Phone Note From Other Clinic   Caller: [facility]  [name] Call For: Rita Miller  Summary of Call: she is having breast pain could not feel any lumps but it was tender.  They gave her some narcotics.  Does Dr Rita Miller want to see her about this soreness and swelling.  Also she returned from the hospital with pneumonia.  She is on avelox 400 mg by mouth 1 per day.   call Melissa @ [phone] ( ask for [facility] and they will page her) Initial call taken by: Roselle Locus,  July 05, 2010 10:49 AM  Follow-up for Phone Call        yes, I suggest OV Follow-up by: [name] DO,  July 05, 2010 4:11 PM  Additional Follow-up for Phone Call Additional follow up Details #1::        call returned to [facility] at [phone], she has been advised per Dr Rita Miller instructions. Appointment scheduled for patient on 11/30 @ 10:30 am Additional Follow-up by: [name],  July 05, 2010 4:31 PM

## 2010-09-07 NOTE — Miscellaneous (Signed)
Summary: [facility]  [facility]   Imported By: Lanelle Bal 05/11/2010 11:59:43  _____________________________________________________________________  External Attachment:    Type:   Image     Comment:   External Document

## 2010-09-07 NOTE — Progress Notes (Signed)
Summary: when is next pt INR due   Phone Note From Other Clinic Call back at [phone]   Caller: [facility] Call For: Artist Pais Summary of Call: The rehab needs to know when the next pt INR should be drawn  Initial call taken by: Roselle Locus,  April 27, 2010 2:14 PM  Follow-up for Phone Call        05/23/2010 Follow-up by: [name] DO,  April 27, 2010 5:14 PM  Additional Follow-up for Phone Call Additional follow up Details #1::        Notified [name] at nursing home and order sent to lab. Nicki Guadalajara Fergerson CMA Duncan Dull)  April 27, 2010 5:53 PM

## 2010-09-07 NOTE — Letter (Signed)
[facility]   Imported By: Lanelle Bal 09/28/2009 08:58:44  _____________________________________________________________________  External Attachment:    Type:   Image     Comment:   External Document

## 2010-09-07 NOTE — Medication Information (Signed)
Summary: Coumadin Clinic  Anticoagulant Therapy  Managed by: Inactive PCP: [name] DO Supervising MD: Riley Kill MD, Maisie Fus Indication 1: Atrial Fibrillation Indication 2: PE/DVT Lab Used: [facility] [place] [facility]  Site: [address] INR RANGE 2-3          Comments: Pt being followed by Dr. Artist Pais   Allergies: 1)  ! Percocet 2)  ! Morphine Sulfate (Morphine Sulfate) 3)  Aricept  Anticoagulation Management History:      Positive risk factors for bleeding include an age of 75 years or older.  The bleeding index is 'intermediate risk'.  Positive CHADS2 values include Age > 75 years old.  Her last INR was 1.39.  Anticoagulation responsible provider: Riley Kill MD, Maisie Fus.  Exp: 05/2010.    Anticoagulation Management Assessment/Plan:      The patient's current anticoagulation dose is Coumadin 4 mg tabs: take as directed.  The target INR is 2.0-3.0.  The next INR is due 08/12/2009.  Anticoagulation instructions were given to DAUGHTER.  Results were reviewed/authorized by Inactive.         Prior Anticoagulation Instructions: Pt has been taking 2 mg / 4 mg alternating prior to hospital.  Now, with INR 1.45 at [facility].  Dr. Artist Pais unavailable to address dose.  Called Toni at 345 pm 08/05/2009.  She was unable to write down doses, but agrees to give 6 mg today then asks that we call back in am.  Called and d/w pt on 12/30 at 1350.  Dtr agrees to give 4 mg daily and recheck in 1 week (Wed) with Dr. Olegario Messier office.

## 2010-09-07 NOTE — Progress Notes (Signed)
Summary: daughter is wondering about Hospice helping with mother  Phone Note From Other Clinic   Caller: Nurse Summary of Call: [facility]  called and states that pt's daughter [name] is interested in Hospice helping with her mother. Can Hospice have a order for this & get this set up? Call back Newman at [phone] Initial call taken by: [name],  October 29, 2009 11:55 AM  Follow-up for Phone Call        we need to discuss whether hospice is appropriate at office visit.  Daughter should plan to attend Follow-up by: [name] DO,  October 29, 2009 4:57 PM

## 2010-09-07 NOTE — Miscellaneous (Signed)
Summary: Diagnosis Report/[facility]  Diagnosis Report/[facility]   Imported By: Lanelle Bal 07/16/2010 13:06:35  _____________________________________________________________________  External Attachment:    Type:   Image     Comment:   External Document

## 2010-09-07 NOTE — Progress Notes (Signed)
Summary: Xray Results  Phone Note Outgoing Call   Summary of Call: call pt - cxr shows aeration of  lungs improved.    Initial call taken by: [name] DO,  July 07, 2010 7:43 PM  Follow-up for Phone Call        call placed to patients daughter Liane Comber at [phone], she has been advised per Dr Artist Pais instructions Follow-up by: [name],  July 08, 2010 1:59 PM

## 2010-09-07 NOTE — Progress Notes (Signed)
Summary: is procedure all right for pt to have   Phone Note Call from Patient   Caller: pt daughter [name] Call For: Artist Pais  Summary of Call: Jamesetta So went to the vascular surgeon yesterday.  He wants her to have dye put in her legs to see where the blockage is.  [name] wants to be sure this procedure is ok with you.  [phone] Initial call taken by: Roselle Locus,  September 22, 2009 10:52 AM  Follow-up for Phone Call        yes, I suggest she proceed with study as advised Follow-up by: [name] DO,  September 22, 2009 1:24 PM  Additional Follow-up for Phone Call Additional follow up Details #1::        called daughter- [name] and informed her that the pt. should proceed with this study Additional Follow-up by: [name],  September 22, 2009 2:39 PM

## 2010-09-07 NOTE — Progress Notes (Signed)
Summary: Consult Note  Consult Note   Imported By: Lanelle Bal 05/28/2010 12:36:41  _____________________________________________________________________  External Attachment:    Type:   Image     Comment:   External Document

## 2010-09-07 NOTE — Progress Notes (Signed)
Summary: NOT SLEEPING  Phone Note Call from Patient   Caller: PT DAUGHTER Rita Miller LIVE Call For: YOO  Summary of Call: Rita Miller IS NOT SLEEPING.  SHE WAS UP ALL NIGHT LAST NIGHT  SHE TOOK THE 15 MG MERTAZAPINE WITH NO RESULT.  PLEASE ADVISE Rita Miller WHAT TO DO.  [phone] Initial call taken by: Roselle Locus,  September 01, 2009 12:55 PM  Follow-up for Phone Call        take mirtazipine daily for at least 1 week before determining medication not working. avoid all caffeinated beverages Follow-up by: [name] DO,  September 01, 2009 2:37 PM  Additional Follow-up for Phone Call Additional follow up Details #1::        left a detailed message for [name] to have her mother to take Mirtazipine daily for at least 1 wk. before determining meds do not work. Also informed her to avoid all caffeinated drinks. Told her if she had anymore questions to call our office.[name]  September 01, 2009 2:44 PM

## 2010-09-07 NOTE — Cardiovascular Report (Signed)
Summary: Office Visit   Office Visit   Imported By: Roderic Ovens 09/15/2009 11:29:42  _____________________________________________________________________  External Attachment:    Type:   Image     Comment:   External Document

## 2010-09-07 NOTE — Miscellaneous (Signed)
Summary: Physician Sherren Kerns  Physician Orders/[facility]   Imported By: Lanelle Bal 05/18/2010 09:41:44  _____________________________________________________________________  External Attachment:    Type:   Image     Comment:   External Document

## 2010-09-07 NOTE — Miscellaneous (Signed)
Summary: Diagnosis Report & Physician Sherren Kerns  Diagnosis Report & Physician Orders/[facility]   Imported By: Lanelle Bal 06/19/2010 08:32:42  _____________________________________________________________________  External Attachment:    Type:   Image     Comment:   External Document

## 2010-09-07 NOTE — Miscellaneous (Signed)
Summary: Clarification for Doxycycline/[facility]  Clarification for Doxycycline/[facility]   Imported By: Lanelle Bal 05/18/2010 10:49:25  _____________________________________________________________________  External Attachment:    Type:   Image     Comment:   External Document

## 2010-09-07 NOTE — Progress Notes (Signed)
Summary: pt. is in the hospital at [facility]  Phone Note Call from Patient   Caller: Daughter Call For: [name] DO Summary of Call: Pt.'s daughter wanted to let you know that Ms. Kensinger is in the hospital at [facility] & would like to talk to Dr.Richie Vadala about her mother. Call Toni-daughter back at [phone] Initial call taken by: [name],  September 28, 2009 9:41 AM  Follow-up for Phone Call        please call daughter and find out what she needs Follow-up by: [name] DO,  September 28, 2009 3:23 PM  Additional Follow-up for Phone Call Additional follow up Details #1::        call returned to patients daughter who states mother (the patient) is in [facility]. She needed clarification on the medication that patient was advised by Dr Artist Pais not to take. After checking with Dr Artist Pais she was informed the medication was Metoprolol.   Additional Follow-up by: [name],  September 28, 2009 5:20 PM

## 2010-09-07 NOTE — Miscellaneous (Signed)
Summary: Physician's Orders/[facility]  Physician's Orders/[facility]   Imported By: Maryln Gottron 07/06/2010 14:50:12  _____________________________________________________________________  External Attachment:    Type:   Image     Comment:   External Document

## 2010-09-07 NOTE — Progress Notes (Signed)
Summary: ORDER CLARIFICATION   Phone Note From Other Clinic Call back at [phone]   Caller: Lynett Fish REMI Call For: YOO  Summary of Call: NEEDS CLARIFICATION ON ORDER FOR MELATONIN TAKE 1/4 AT NOON 4PM 8PM X 7 DAYS.  PLEASE CLARIFY THIS ORDER.  Initial call taken by: Roselle Locus,  November 17, 2009 10:03 AM  Follow-up for Phone Call        call returned to [facility] at [facility], she was clarification of patients current medication. The medications that were being given to patient was from the 3/31 office visit. Remi was advised to check patients FL2 form that list patients current medication. She was advised that patient is no longer taking the Melatonin nor is she taking the Prednsione. Remi verbalized understandind and states she will make the necessary correction Follow-up by: [name],  November 17, 2009 10:19 AM

## 2010-09-07 NOTE — Progress Notes (Signed)
Summary: Coumadin Status  ---- Converted from flag ---- ---- 06/16/2010 1:28 PM, Bethena Midget, RN, BSN wrote: Per Dr Sanjuana Kava note in 05/2010 pt should still be on her coumadin he did not discontinue it or make any reference to the drug.  Last order for coumadin instructions were given on 10/21 from Dr Artist Pais which was to hold for 2 days and repeat in one week. [name] at [facility] nursing facility only needs orders stating when to redraw and dosing instructions. Please assist her with this matter. ------------------------------  Phone Note Other Incoming Call back at [phone]   Caller: [facility] Summary of Call: When is patient due for next Coumadin draw? and is she to make any adjustments on her Coumadin. She is currently at 2 mg. Please advise Initial call taken by: [name],  June 16, 2010 1:32 PM  Follow-up for Phone Call        [place], I did not ask her to hold her coumadin. What is she schedule for on 06/29/10? Follow up with me?  chris Follow-up by: Verne Carrow, MD,  June 16, 2010 4:43 PM

## 2010-09-07 NOTE — Progress Notes (Signed)
  Phone Note From Other Clinic   Caller: [facility] Nurse Details for Reason: Labs Summary of Call: Nurse call for lab results     INR    2.2    PT   21.9   call back #    [phone] Initial call taken by: Darral Dash,  September 23, 2009 12:07 PM

## 2010-09-07 NOTE — Letter (Signed)
[facility]   Imported By: Lanelle Bal 05/04/2010 09:46:58  _____________________________________________________________________  External Attachment:    Type:   Image     Comment:   External Document

## 2010-09-07 NOTE — Progress Notes (Signed)
Summary: Status Update  Phone Note Call from Patient Call back at Home Phone [phone]   Caller: Daughter Summary of Call: Pt's daughter does not understand why the form is not complete Initial call taken by: Lannette Donath,  November 13, 2009 12:36 PM  Follow-up for Phone Call        spoke with patients daughter several times today, she has been informed that Dr covering for Dr Artist Pais prefers the signature be held for Dr Artist Pais on Monday, she states she was trying to have her mother moved in over the weekend, which is why there was urgency. She was informed every effort would be made to have the  paper work is completed and she will be called once everything had been completed Follow-up by: [name],  November 13, 2009 4:57 PM  Additional Follow-up for Phone Call Additional follow up Details #1::        call placed to patients daughter Liane Comber at [phone], and she was informed the paperwork has been faxed to both facilities and the [facility] is requesting a PPD placement if she has not had one in the past year. [name] has verbalized understanding and is aware. Additional Follow-up by: [name],  November 16, 2009 5:24 PM

## 2010-09-07 NOTE — Assessment & Plan Note (Signed)
Summary: f/u after seen in E.R. for touch of Bronchitis- jr   Vital Signs:  Patient profile:   77 year old female Weight:      105.50 pounds BMI:     20.68 O2 Sat:      96 % on Room air Temp:     98.0 degrees F oral Pulse rate:   96 / minute Pulse rhythm:   regular Resp:     18 per minute BP sitting:   140 / 70  (left arm) Cuff size:   regular  Vitals Entered By: [name] (September 24, 2009 2:37 PM)  O2 Flow:  Room air  Primary Care Provider:  [name] DO  CC:  ER Follow up.  History of Present Illness: ER Follow up   77 yo recently seen at urgent care for cough.  she was diagnosed with mild bronchitis and started on abx  memory loss - memory medication is not helping c/o trouble staying and falling alseep caretaker states patient has become agitated for the pst 3 days  int hx: ABI showed PVD.  she has f/u vascular specialist   Allergies: 1)  ! Percocet 2)  ! Morphine Sulfate (Morphine Sulfate) 3)  Aricept  Past History:  Past Medical History: CORONARY ARTERY DISEASE (ICD-414.00) CEREBROVASCULAR ACCIDENT, HX OF (ICD-V12.50) ATRIAL FIBRILLATION (ICD-427.31)  HYPERLIPIDEMIA (ICD-272.4) COPD (ICD-496)   EPISTAXIS, RECURRENT (ICD-784.7)  OSTEOPOROSIS (ICD-733.00)  HYPOTHYROIDISM (ICD-244.9)   PULMONARY EMBOLISM, HX OF (ICD-V12.51) DIVERTICULITIS, HX OF (ICD-V12.79) H/O peptic ulcer disease CERVICAL CANCER, HX OF (ICD-V10.41) BREAST CANCER, HX OF (ICD-V10.3) ASTHMA (ICD-493.90) ANEMIA-IRON DEFICIENCY (ICD-280.9) Blood transfusion  Hx of DVT Hx LBBB Severe MR; MV repair 7/10 Severe Aortic Stenosis Bradycardia, s/p pacemaker implant  Past Surgical History: H/O thyroidectomy due to cancer Total hip replacement Hysterectomy  Maectomy  Mitral valve repair   Left Eye Implant    S/P Pacemaker    Family History: Family History Breast cancer 1st degree relative <50 Family History of Colon CA 1st degree relative <60 Family History  Diabetes 1st degree relative Family History High cholesterol Family History Hypertension   Mother with unknown heart disease        Social History: Retired Widow  1 daughter  Tobacco Use - Former.    Alcohol Use - no       Physical Exam  General:  alert, well-developed, and well-nourished.   Ears:  R ear normal and L ear normal.   Mouth:  upper dental plate,  no oral lesion Lungs:  normal respiratory effort.  faint exp wheeze, otherwise clear Heart:  normal rate, regular rhythm, and no gallop.   Abdomen:  soft and non-tender.   Extremities:  No lower extremity edema  Neurologic:  cranial nerves II-XII intact.   Psych:  good eye contact and slightly anxious.     Impression & Recommendations:  Problem # 1:  BRONCHITIS, ACUTE WITH BRONCHOSPASM (ICD-466.0)  Agree with bronchitis. take abx and tussionex as directed  Her updated medication list for this problem includes:    Flovent Hfa 44 Mcg/act Aero (Fluticasone propionate  hfa) .Marland Kitchen... 2 puffs by mouth once daily    Combivent 103-18 Mcg/act Aero (Albuterol-ipratropium) .Marland Kitchen... 2 inh. q4-6h as needed    Cefuroxime Axetil 500 Mg Tabs (Cefuroxime axetil) .Marland Kitchen... 1 by mouth 2 times daily    Tussionex Pennkinetic Er 8-10 Mg/49ml Lqcr (Chlorpheniramine-hydrocodone) ..... Sig 1/2tsp to 1 tsp by mouth twice aday prn fo cough please start w/ 1/2 a tsp  Orders: Prescription Created Electronically [phone])  Problem # 2:  INSOMNIA, CHRONIC (ICD-307.42) I suspect chronic anxiety.   add low dose alprazolam.  caregiver to monitor pt closely for worsening aggitation.  next visit, consider stop remeron and replace with citalopram  Problem # 3:  MEMORY LOSS (ICD-780.93) no sign change with namenda.   add exelon patch.  Problem # 4:  COUMADIN THERAPY (ICD-V58.61) INR 2.2 .   she reports mild nose bleeds.  Pt started on ceftin and cardizem dose increase.  check INR next week.  Problem # 5:  ATRIAL FIBRILLATION (ICD-427.31) Increase  cardizem  dose vs add metoprolol which may exacerbate COPD and bronchospasm  The following medications were removed from the medication list:    Metoprolol Tartrate 25 Mg Tabs (Metoprolol tartrate) .Marland Kitchen... Take 1 tablet by mouth two times a day Her updated medication list for this problem includes:    Coumadin 4 Mg Tabs (Warfarin sodium) ..... One by mouth once daily    Cardizem Cd 240 Mg Xr24h-cap (Diltiazem hcl coated beads) ..... One by mouth once daily    Aspirin 81 Mg Tbec (Aspirin) ..... One by mouth every day  Complete Medication List: 1)  Coumadin 4 Mg Tabs (Warfarin sodium) .... One by mouth once daily 2)  Cardizem Cd 240 Mg Xr24h-cap (Diltiazem hcl coated beads) .... One by mouth once daily 3)  Synthroid 100 Mcg Tabs (Levothyroxine sodium) .... Take 1 tablet by mouth every morning 4)  Quinapril Hcl 10 Mg Tabs (Quinapril hcl) .... Take 1 tablet by mouth once a day 5)  Flovent Hfa 44 Mcg/act Aero (Fluticasone propionate  hfa) .... 2 puffs by mouth once daily 6)  Aspirin 81 Mg Tbec (Aspirin) .... One by mouth every day 7)  Mirtazapine 15 Mg Tabs (Mirtazapine) .... One  tab by mouth at bedtime 8)  Namenda 5 Mg Tabs (Memantine hcl) .... One by mouth two times a day 9)  Lidoderm 5 % Ptch (Lidocaine) .... Apply to affected ared every 12 hours as needed 10)  Combivent 103-18 Mcg/act Aero (Albuterol-ipratropium) .... 2 inh. q4-6h as needed 11)  Combivent 103-18 Mcg/act Aero (Albuterol-ipratropium) .... 2 inh. q4-6h as needed 12)  Cefuroxime Axetil 500 Mg Tabs (Cefuroxime axetil) .Marland Kitchen.. 1 by mouth 2 times daily 13)  Tussionex Pennkinetic Er 8-10 Mg/60ml Lqcr (Chlorpheniramine-hydrocodone) .... Sig 1/2tsp to 1 tsp by mouth twice aday prn fo cough please start w/ 1/2 a tsp 14)  Alprazolam 0.25 Mg Tabs (Alprazolam) .... 1/2 to one tab  by mouth at bedtime as needed for sleep 15)  Exelon 4.6 Mg/24hr Pt24 (Rivastigmine) .... Apply new patch  daily  Patient Instructions: 1)  Please schedule a follow-up  appointment in 2 weeks. 2)  Return in one week for repeat INR.  3)  Do not use alprazolam when you are using tussionex for cough Prescriptions: EXELON 4.6 MG/24HR PT24 (RIVASTIGMINE) apply new patch  daily  #14 x 0   Entered and Authorized by:   [name] DO   Signed by:   [name] DO on 09/24/2009   Method used:   [facility] to        [facility] [phone]* (retail)       [address] [place], [place]  [address]       Ph: [phone] or [phone]       Fax: [phone]   RxID:   [id number] ALPRAZOLAM 0.25 MG TABS (ALPRAZOLAM) 1/2 to  one tab  by mouth at bedtime as needed for sleep  #30 x 1   Entered and Authorized by:   [name] DO   Signed by:   [name] DO on 09/24/2009   Method used:   Print then Give to Patient   RxID:   [id number] CARDIZEM CD 240 MG XR24H-CAP (DILTIAZEM HCL COATED BEADS) one by mouth once daily  #30 x 2   Entered and Authorized by:   [name] DO   Signed by:   [name] DO on 09/24/2009   Method used:   Electronically to        [facility] [phone]* (retail)       [address] [place], [place]  [address]       Ph: [phone] or [phone]       Fax: [phone]   RxID:   [id number]     Current Allergies (reviewed today): ! PERCOCET ! MORPHINE SULFATE (MORPHINE SULFATE) ARICEPT

## 2010-09-07 NOTE — Progress Notes (Signed)
Summary: Daycare forms  Phone Note From Other Clinic Call back at [phone]   Caller: Nurse Summary of Call: Janna Arch called, okay to have Dr Artist Pais complete on Monday when he returns then refax them to [facility] at [phone] Initial call taken by: Lannette Donath,  November 13, 2009 12:23 PM  Follow-up for Phone Call        Pls make sure item #4 is complete on the form Diane Tomerlin  November 13, 2009 12:26 PM  Additional Follow-up for Phone Call Additional follow up Details #1::        Forms completed and faxed to  [facility] at [phone] Additional Follow-up by: [name],  November 16, 2009 5:19 PM

## 2010-09-07 NOTE — Letter (Signed)
[facility]   Imported By: Lanelle Bal 11/19/2009 12:53:17  _____________________________________________________________________  External Attachment:    Type:   Image     Comment:   External Document

## 2010-09-07 NOTE — Progress Notes (Signed)
Summary: Request to cancel order  Phone Note Other Incoming Call back at [phone]   Caller: Loleta Books-  Royanne Foots Summary of Call: Aram Beecham from [facility] called and left voice message stating their facility is not a skilled nurisng facility and she is requesting a order to cancell the overnight pulse oxyen. They do not perform the testing.  Daughter took mother to ED @ [facility],because of bruising  per daughter- she will be leaving [facility] Darral Dash  March 01, 2010 12:37 PM   Initial call taken by: [name],  March 01, 2010 11:20 AM  Follow-up for Phone Call        see order canceling overnight pulse ox inform pt - INR is 2.51 please confirm coumadin dose that pt has been taking Follow-up by: [name] DO,  March 01, 2010 1:55 PM  Additional Follow-up for Phone Call Additional follow up Details #1::        call placed to [facility], spoke with Wyatt Mage, she has confirmed that patient has been taking Coumadin 5mg  by mouth daily.   Order faxed back to [facility] for patient to continue Coumadin 5mg  and repeat INR in 2 weeks.  Order provide for Tylenol 650 by mouth every 8 hours as needed pain Additional Follow-up by: [name],  March 01, 2010 3:48 PM    New/Updated Medications: COUMADIN 5 MG TABS (WARFARIN SODIUM) Take 1 tablet by mouth once a day TYLENOL 8 HOUR 650 MG CR-TABS (ACETAMINOPHEN) one tabl by mouth every 8 hours as needed pain

## 2010-09-07 NOTE — Progress Notes (Signed)
Summary: Painful toe / INR  Phone Note From Other Clinic Call back at [phone] or [phone]   Caller: Provider Summary of Call: SW French Ana, unit supervisor,  [facility], pt admitted today with painful missing toenail, redness spread up pt's leg, [name] is refusing treatment from their physicians, pls call to advise Initial call taken by: Lannette Donath,  March 10, 2010 1:42 PM  Follow-up for Phone Call        Dr. Lauralyn Primes from [facility] called stating that pt was brought to their facility by her daughter yesterday. Pt is requesting something for pain. They have no FL2, only have the MAR from [facility]. They saw that pt had been on Lidoderm patch and wants to know if they can get an order for that.  They were giving pt. Tylenol but [name] told them not to give pt anything. She told the facility that she would come by every night to give her mother some type of Tea. [name] told them she did not want the facility physician to treat her mother. Only wants Dr Artist Pais to treat her mom.  The doctor states that the daughter is out of town on business. Please advise.  Nicki Guadalajara Fergerson CMA Duncan Dull)  March 10, 2010 5:27 PM   Additional Follow-up for Phone Call Additional follow up Details #1::        Received call from [facility]. States that pt's toe is getting worse. Advised her pt needed to be seen in our office. She stated that transportation was booked tomorrow and they had no way to bring her in. Advised her to have staff physician examine and treat pt's toe. She states the Medical Director would be there tomorrow and address the toe.  Pt's  PT is 31.7 and INR is 2.74. Current Coumadin dose is 2mg  1 daily.  Please advise.  Nicki Guadalajara Fergerson CMA Duncan Dull)  March 11, 2010 2:25 PM     Additional Follow-up for Phone Call Additional follow up Details #2::    Received call from pt's daughter stating that pt needed something for her toe. Earnest Conroy that the pt  will need to be seen before we could prescribe anything.  Earnest Conroy to consider having the staff physician at [facility] assess and treat pt's toe to help pt get immediate care.  Nicki Guadalajara Fergerson CMA Duncan Dull)  March 12, 2010 4:53 PM   Additional Follow-up for Phone Call Additional follow up Details #3:: Details for Additional Follow-up Action Taken: please clarify coumadin dosing.   Is pt taking 2 mg or 5 mg of coumadin currently   Additional Follow-up by: [name] DO,  March 15, 2010 5:29 PM    Spoke with the nurse at [facility], he states he was unable to provide the information requested, because patients MAR has been removed and medical records leaves at 5pm. He did state patient is no longer at this facility, she was advised by the physician there to go to the ER and believes the patient may be in [facility]. She was advised to go there to have her toe infection evaluated per nursing staff at [facility] CMA  March 15, 2010 5:46 PM

## 2010-09-07 NOTE — Miscellaneous (Signed)
Summary: [facility]  [facility]   Imported By: Lanelle Bal 04/02/2010 10:15:38  _____________________________________________________________________  External Attachment:    Type:   Image     Comment:   External Document

## 2010-09-07 NOTE — Letter (Signed)
Summary: triage call a nurse   triage call a nurse   Imported By: Roselle Locus 05/17/2010 09:02:46  _____________________________________________________________________  External Attachment:    Type:   Image     Comment:   External Document

## 2010-09-07 NOTE — Miscellaneous (Signed)
Summary: OT Order/[facility]  OT Order/[facility]   Imported By: Lanelle Bal 09/28/2009 09:12:19  _____________________________________________________________________  External Attachment:    Type:   Image     Comment:   External Document

## 2010-09-07 NOTE — Assessment & Plan Note (Signed)
Summary: Cough/hea   Vital Signs:  Patient profile:   77 year old female Height:      60 inches Weight:      105 pounds BMI:     20.58 O2 Sat:      94 % on Room air Temp:     97.4 degrees F oral Pulse rate:   88 / minute Pulse rhythm:   regular Resp:     24 per minute BP sitting:   120 / 60  (left arm) Cuff size:   regular  Vitals Entered By: [name] (October 30, 2009 10:00 AM)  O2 Flow:  Room air CC: Rm 3- Cough, COPD follow-up Comments c/o chest congestion, worse with lying down, taken Nyquil taken with no relief, dicuss medical records from [facility], INR check   Primary Care Provider:  [name] DO  CC:  Rm 3- Cough and COPD follow-up.  History of Present Illness:  COPD Follow-Up      This is a 77 year old woman who presents for COPD follow-up.  The patient reports shortness of breath, cough, and increased sputum.  Symptoms occur daily.  The patient reports limitation of most activities.    cough worse over last 2 days.  no fever  Daughter is having trouble taking care of mother at home.  Pt does not drive  A Fib - pt has been unable to go to lab for INR.  denies bleeding  Allergies: 1)  ! Percocet 2)  ! Morphine Sulfate (Morphine Sulfate) 3)  Aricept  Past History:  Past Medical History: CORONARY ARTERY DISEASE (ICD-414.00) CEREBROVASCULAR ACCIDENT, HX OF (ICD-V12.50) ATRIAL FIBRILLATION (ICD-427.31)  HYPERLIPIDEMIA (ICD-272.4)  COPD (ICD-496)     EPISTAXIS, RECURRENT (ICD-784.7)  OSTEOPOROSIS (ICD-733.00)  HYPOTHYROIDISM (ICD-244.9)   PULMONARY EMBOLISM, HX OF (ICD-V12.51) DIVERTICULITIS, HX OF (ICD-V12.79) H/O peptic ulcer disease CERVICAL CANCER, HX OF (ICD-V10.41) BREAST CANCER, HX OF (ICD-V10.3) ASTHMA (ICD-493.90) ANEMIA-IRON DEFICIENCY (ICD-280.9) Blood transfusion  Hx of DVT Hx LBBB Severe MR; MV repair 7/10 Severe Aortic Stenosis Bradycardia, s/p pacemaker implant  Past Surgical History: H/O thyroidectomy due  to cancer Total hip replacement Hysterectomy  Maectomy    Mitral valve repair   Left Eye Implant    S/P Pacemaker    Family History: Family History Breast cancer 1st degree relative <50 Family History of Colon CA 1st degree relative <60 Family History Diabetes 1st degree relative Family History High cholesterol Family History Hypertension   Mother with unknown heart disease          Social History: Retired Widow  1 daughter  Tobacco Use - Former.    Alcohol Use - no         Review of Systems  The patient denies syncope.         left breast tenderness  Physical Exam  General:  alert and underweight appearing.   Head:  normocephalic and atraumatic.   Neck:  supple and no masses.   Breasts:  left breast tendernss - 4-5 o'clock Lungs:  normal respiratory effort.  bilateral exp wheeze Heart:  normal rate, regular rhythm, and no gallop.   Abdomen:  soft, non-tender, and normal bowel sounds.   Extremities:  No lower extremity edema  Skin:  skin appears slightly jaundiced. Psych:  normally interactive and good eye contact.     Impression & Recommendations:  Problem # 1:  BRONCHITIS, ACUTE WITH BRONCHOSPASM (ICD-466.0) 77 y/o with acute bronchitis/ COPD flare.  tx with abx and  prednisone taper.  pt given 125 mg of solumedrol IM. arrange home care nurse assessment.  The following medications were removed from the medication list:    Tussionex Pennkinetic Er 8-10 Mg/61ml Lqcr (Chlorpheniramine-hydrocodone) ..... Sig 1/2tsp to 1 tsp by mouth twice aday prn fo cough please start w/ 1/2 a tsp    Ceftin 500 Mg Tabs (Cefuroxime axetil) .Marland Kitchen... Take 1 tablet by mouth two times a day Her updated medication list for this problem includes:    Flovent Hfa 44 Mcg/act Aero (Fluticasone propionate  hfa) .Marland Kitchen... 2 puffs by mouth once daily    Combivent 103-18 Mcg/act Aero (Albuterol-ipratropium) .Marland Kitchen... 2 inh. q4-6h as needed    Avelox 400 Mg Tabs (Moxifloxacin hcl) ..... One by mouth once  daily  Orders: T-2 View CXR, Same Day (71020.5TC) Albuterol Sulfate Sol 1mg  unit dose (Z6109) Nebulizer Tx (60454) Solumedrol up to 125mg  (U9811) Admin of Therapeutic Inj  intramuscular or subcutaneous (91478) Misc. Referral (Misc. Ref)  Problem # 2:  COUMADIN THERAPY (ICD-V58.61) Pt having difficulty coming to lab for INR.  she denies bleeding  Orders: T-Protime, Auto ([id number]) T-CBC w/Diff (57846-96295)  Problem # 3:  BREAST PAIN, LEFT (ICD-611.71) 76 y/o with hx of right breast cancer c/o left sided breast pain.   arrange mammo  Orders: Radiology Referral (Radiology)  Problem # 4:  ATRIAL FIBRILLATION (ICD-427.31) she is rate controlled.  Her updated medication list for this problem includes:    Coumadin 4 Mg Tabs (Warfarin sodium) ..... One by mouth once daily    Cardizem 120 Mg Tabs (Diltiazem hcl) .Marland Kitchen... Take 1 tablet by mouth once a day    Aspirin 81 Mg Tbec (Aspirin) ..... One by mouth every day  Reviewed the following: PT: 27.1 (08/12/2009)   INR: 2.54 (08/12/2009) Coumadin Dose (weekly): 28 mg (08/05/2009) Prior Coumadin Dose (weekly): 28 mg (08/05/2009) Next Protime: 08/12/2009 (dated on 08/05/2009)  Complete Medication List: 1)  Coumadin 4 Mg Tabs (Warfarin sodium) .... One by mouth once daily 2)  Cardizem 120 Mg Tabs (Diltiazem hcl) .... Take 1 tablet by mouth once a day 3)  Synthroid 100 Mcg Tabs (Levothyroxine sodium) .... Take 1 tablet by mouth every morning 4)  Diovan 80 Mg Tabs (Valsartan) .... One by mouth once daily 5)  Flovent Hfa 44 Mcg/act Aero (Fluticasone propionate  hfa) .... 2 puffs by mouth once daily 6)  Aspirin 81 Mg Tbec (Aspirin) .... One by mouth every day 7)  Mirtazapine 15 Mg Tabs (Mirtazapine) .... One  tab by mouth at bedtime 8)  Namenda 5 Mg Tabs (Memantine hcl) .... One by mouth two times a day 9)  Combivent 103-18 Mcg/act Aero (Albuterol-ipratropium) .... 2 inh. q4-6h as needed 10)  Exelon 4.6 Mg/24hr Pt24 (Rivastigmine) ....  Apply new patch  daily 11)  Avelox 400 Mg Tabs (Moxifloxacin hcl) .... One by mouth once daily 12)  Prednisone 10 Mg Tabs (Prednisone) .... 4 tabs by mouth q.d. x 3 days, 3 tabs by mouth q.d. x 3 days, 2 tabs p.o. once daily x 3 days, 1 tab p.o. x 3 days  Other Orders: T-Hepatic Function ([id number]) T-Basic Metabolic Panel ([id number])  Patient Instructions: 1)  Please schedule a follow-up appointment in 1 week. Prescriptions: PREDNISONE 10 MG TABS (PREDNISONE) 4 tabs by mouth q.d. x 3 days, 3 tabs by mouth q.d. x 3 days, 2 tabs p.o. once daily x 3 days, 1 tab p.o. x 3 days  #30 x 0   Entered and Authorized by:  [name] DO   Signed by:   [name] DO on 10/30/2009   Method used:   [facility]ron[facility] to        [facility] [phone]* (retail)       [address] [place], [place]  [address]       Ph: [phone] or [phone]       Fax: [phone]   RxID:   [id number] AVELOX 400 MG TABS (MOXIFLOXACIN HCL) one by mouth once daily  #10 x 0   Entered and Authorized by:   [name] DO   Signed by:   [name] DO on 10/30/2009   Method used:   [facility]ronically to        [facility] [phone]* (retail)       [address] [place], [place]  [address]       Ph: [phone] or [phone]       Fax: [phone]   RxID:   [id number]   Current Allergies (reviewed today): ! PERCOCET ! MORPHINE SULFATE (MORPHINE SULFATE) ARICEPT   Medication Administration  Injection # 1:    Medication: Solumedrol up to 125mg     Diagnosis: BRONCHITIS, ACUTE WITH BRONCHOSPASM (ICD-466.0)    Route: IM    Site: RUOQ gluteus    Exp Date: 04/07/2012    Lot #: [id number]    Mfr: Pharmacia    Patient tolerated injection without complications    Given by: [name] (October 30, 2009 12:25 PM)  Medication # 1:    Medication: Albuterol Sulfate Sol 1mg  unit dose    Diagnosis: BRONCHITIS, ACUTE WITH BRONCHOSPASM (ICD-466.0)    Dose: 2.5    Route: inhaled    Exp  Date: 02/05/2011    Lot #: [id number]    Mfr: NEPHRON    Patient tolerated medication without complications    Given by: [name] (October 30, 2009 10:33 AM)  Orders Added: 1)  T-2 View CXR, Same Day [71020.5TC] 2)  T-Protime, Auto [[id number]] 3)  T-CBC w/Diff [61443-15400] 4)  Albuterol Sulfate Sol 1mg  unit dose [J7613] 5)  Nebulizer Tx [94640] 6)  T-Hepatic Function [80076-22960] 7)  T-Basic Metabolic Panel [[id number]] 8)  Radiology Referral [Radiology] 9)  Solumedrol up to 125mg  [J2930] 10)  Admin of Therapeutic Inj  intramuscular or subcutaneous [96372] 11)  Misc. Referral [Misc. Ref] 12)  Est. Patient Level IV [26712]

## 2010-09-07 NOTE — Progress Notes (Signed)
[facility] would [facility] eval   Phone Note From Other Clinic   Caller: [facility] Call For: Artist Pais  Summary of Call: She would like an order for an occupational eval for this patient please fax to [phone] Initial call taken by: Roselle Locus,  September 15, 2009 1:47 PM  Follow-up for Phone Call        Order fax to   [facility]    [phone] Follow-up by: Darral Dash,  September 16, 2009 8:35 AM

## 2010-09-07 NOTE — Medication Information (Signed)
Summary: Lidoderm requires Prior Authorization  Lidoderm requires Prior Authorization   Imported By: Maryln Gottron 03/16/2010 13:16:07  _____________________________________________________________________  External Attachment:    Type:   Image     Comment:   External Document

## 2010-09-07 NOTE — Assessment & Plan Note (Signed)
Summary: 1 mon f/u/hea   Vital Signs:  Patient profile:   77 year old female Height:      60 inches Weight:      105.75 pounds BMI:     20.73 O2 Sat:      99 % on Room air Temp:     97.7 degrees F oral Pulse rate:   73 / minute BP sitting:   122 / 60  (left arm) Cuff size:   regular  Vitals Entered By: Lucious Groves (December 04, 2009 3:47 PM)  O2 Flow:  Room air CC: 1 mo rtn ov./kb Is Patient Diabetic? No Pain Assessment Patient in pain? no        Primary Care Provider:  [name] DO  CC:  1 mo rtn ov./kb.  History of Present Illness: 77 y/o white female for f/u  afib - she has been taking lower dose of coumadin.  denies any bleeding  depression - feeling better overall.  going to adult day program during the day less agitation since starting exelon patch and namenda  PVD - she nevered reschedule appt with vascular specialist.  denies resting leg pain  Current Medications (verified): 1)  Coumadin 4 Mg Tabs (Warfarin Sodium) .... 1/2 Tablet By Mouth Once Daily 2)  Cardizem 120 Mg Tabs (Diltiazem Hcl) .... Take 1 Tablet By Mouth Once A Day 3)  Synthroid 100 Mcg Tabs (Levothyroxine Sodium) .... Take 1 Tablet By Mouth Every Morning 4)  Diovan 80 Mg Tabs (Valsartan) .... One By Mouth Once Daily 5)  Flovent Hfa 44 Mcg/act Aero (Fluticasone Propionate  Hfa) .... 2 Puffs By Mouth Once Daily 6)  Aspirin 81 Mg  Tbec (Aspirin) .... One By Mouth Every Day 7)  Namenda 5 Mg Tabs (Memantine Hcl) .... One By Mouth Two Times A Day 8)  Combivent 103-18 Mcg/act Aero (Albuterol-Ipratropium) .... 2 Inh. Q4-6h As Needed 9)  Exelon 4.6 Mg/24hr Pt24 (Rivastigmine) .... Apply New Patch  Daily  Allergies (verified): 1)  ! Percocet 2)  ! Morphine Sulfate (Morphine Sulfate) 3)  Aricept  Past History:  Past Medical History: CORONARY ARTERY DISEASE (ICD-414.00) CEREBROVASCULAR ACCIDENT, HX OF (ICD-V12.50) ATRIAL FIBRILLATION (ICD-427.31)  HYPERLIPIDEMIA (ICD-272.4)  COPD (ICD-496)       EPISTAXIS, RECURRENT (ICD-784.7)  OSTEOPOROSIS (ICD-733.00)  HYPOTHYROIDISM (ICD-244.9)   PULMONARY EMBOLISM, HX OF (ICD-V12.51) DIVERTICULITIS, HX OF (ICD-V12.79) H/O peptic ulcer disease CERVICAL CANCER, HX OF (ICD-V10.41) BREAST CANCER, HX OF (ICD-V10.3) ASTHMA (ICD-493.90) ANEMIA-IRON DEFICIENCY (ICD-280.9) Blood transfusion  Hx of DVT Hx LBBB Severe MR; MV repair 7/10 Severe Aortic Stenosis Bradycardia, s/p pacemaker implant PAD - left leg  Family History: Family History Breast cancer 1st degree relative <50 Family History of Colon CA 1st degree relative <60 Family History Diabetes 1st degree relative Family History High cholesterol Family History Hypertension   Mother with unknown heart disease            Social History: Retired Widow  1 daughter  Tobacco Use - Former.    Alcohol Use - no           Physical Exam  General:  thin 77 y/o, pleasant,  NAD Head:  normocephalic and atraumatic.   Lungs:  normal respiratory effort and normal breath sounds.   Heart:  normal rate, regular rhythm, and no gallop.   Extremities:  No lower extremity edema  Neurologic:  cranial nerves II-XII intact and gait normal.   Psych:  normally interactive, good eye contact, not anxious appearing, and not  depressed appearing.     Impression & Recommendations:  Problem # 1:  ATRIAL FIBRILLATION (ICD-427.31) rate controlled.  check INR  Her updated medication list for this problem includes:    Coumadin 3 Mg Tabs (Warfarin sodium) ..... One by mouth qd    Cardizem 120 Mg Tabs (Diltiazem hcl) .Marland Kitchen... Take 1 tablet by mouth once a day    Aspirin 81 Mg Tbec (Aspirin) ..... One by mouth every day  Orders: T-CBC No Diff ([id number]) T-Protime, Auto ([id number])  Problem # 2:  UNSPECIFIED PERIPHERAL VASCULAR DISEASE (ICD-443.9) She has hx of PVD of lower ext.  arrange vascular consultation.  start pravastatin  Orders: [facility] Clinic (Vascular) Prescription Created  Electronically [phone])  Problem # 3:  DEPRESSION (ICD-311) Assessment: Improved I suspect mood better for multiple reasons.  socialization at day program, exelon and namenda.  also citalopram  The following medications were removed from the medication list:    Citalopram Hydrobromide 20 Mg Tabs (Citalopram hydrobromide) .Marland Kitchen... 1/2 by mouth once daily x 7 days, then one by mouth once daily  Complete Medication List: 1)  Coumadin 3 Mg Tabs (Warfarin sodium) .... One by mouth qd 2)  Cardizem 120 Mg Tabs (Diltiazem hcl) .... Take 1 tablet by mouth once a day 3)  Synthroid 100 Mcg Tabs (Levothyroxine sodium) .... Take 1 tablet by mouth every morning 4)  Diovan 80 Mg Tabs (Valsartan) .... One by mouth once daily 5)  Flovent Hfa 44 Mcg/act Aero (Fluticasone propionate  hfa) .... 2 puffs by mouth once daily 6)  Aspirin 81 Mg Tbec (Aspirin) .... One by mouth every day 7)  Namenda 5 Mg Tabs (Memantine hcl) .... One by mouth two times a day 8)  Combivent 103-18 Mcg/act Aero (Albuterol-ipratropium) .... 2 inh. q4-6h as needed 9)  Exelon 4.6 Mg/24hr Pt24 (Rivastigmine) .... Apply new patch  daily  Patient Instructions: 1)  Please schedule a follow-up appointment in 2 months. 2)  Schedule INR in 1 month Prescriptions: PRAVASTATIN SODIUM 10 MG TABS (PRAVASTATIN SODIUM) one by mouth once daily  #30 x 3   Entered and Authorized by:   [name] DO   Signed by:   [name] DO on 12/04/2009   Method used:   Electronically to        [facility] [phone][facility] (retail)       [address] [place], [place]  [address]       Ph: [phone] or [phone]       Fax: [phone]   RxID:   [id number]

## 2010-09-07 NOTE — Progress Notes (Signed)
Summary: [facility]  Phone Note Call from Patient   Caller: Daughter Summary of Call: Pt's daughter called to advise pt did not have surgery, developed a fever and pneumonia, pt still at [facility] Initial call taken by: Lannette Donath,  Dec 28, 2009 12:23 PM  Follow-up for Phone Call        noted Follow-up by: [name] DO,  Dec 28, 2009 5:50 PM

## 2010-09-07 NOTE — Letter (Signed)
Summary: CMN for Nebulizer/[facility]  CMN for Nebulizer/[facility]   Imported By: Lanelle Bal 02/03/2010 08:52:15  _____________________________________________________________________  External Attachment:    Type:   Image     Comment:   External Document

## 2010-09-07 NOTE — Progress Notes (Signed)
Summary: Consult & Request for Treatment/[facility]  Consult & Request for Treatment/[facility]   Imported By: Lanelle Bal 05/13/2010 07:51:53  _____________________________________________________________________  External Attachment:    Type:   Image     Comment:   External Document

## 2010-09-07 NOTE — Miscellaneous (Signed)
Summary: SN Orders/[facility]  SN Orders/[facility]   Imported By: Lanelle Bal 09/14/2009 13:04:17  _____________________________________________________________________  External Attachment:    Type:   Image     Comment:   External Document

## 2010-09-07 NOTE — Progress Notes (Signed)
Summary: Quinapril Refill & PT/INR results  Phone Note Refill Request Message from:  Fax from Pharmacy on August 27, 2009 1:37 PM  Refills Requested: Medication #1:  QUINAPRIL HCL 10 MG TABS Take 1 tablet by mouth once a day   Dosage confirmed as above?Dosage Confirmed   Brand Name Necessary? No   Supply Requested: 1 month  Method Requested: Electronic Next Appointment Scheduled: 08-31-09 d[facility] [place] 10AM Initial call taken by: Roselle Locus,  August 27, 2009 1:38 PM  Follow-up for Phone Call        [place] from [facility] called and left voice message stating she called last week with patients PT and INR results and have not heard anything back regarding the results. Patients PT 23.5 Inr 2.4.Her message states patient has a follow up with Dr on Monday Follow-up by: [name],  August 27, 2009 1:59 PM  Additional Follow-up for Phone Call Additional follow up Details #1::        call pt and [facility].  continue same dose of coumadin.  repeat INR in 1 month    Additional Follow-up for Phone Call Additional follow up Details #2::    contacted  Shelly at  [phone] of [facility], she was advised per Dr Artist Pais instructions Follow-up by: [name],  August 27, 2009 5:34 PM  New/Updated Medications: COUMADIN 4 MG TABS (WARFARIN SODIUM) one by mouth once daily Prescriptions: QUINAPRIL HCL 10 MG TABS (QUINAPRIL HCL) Take 1 tablet by mouth once a day  #30 x 5   Entered and Authorized by:   [name] DO   Signed by:   [name] DO on 08/27/2009   Method used:   [facility]        [facility] [phone][facility] (retail)       [address] [place], [place]  [address]       Ph: [phone] or [phone]       Fax: [phone]   RxID:   [id number]

## 2010-09-07 NOTE — Assessment & Plan Note (Signed)
Summary: BRONCHITIS NO BETTER/[facility]   Vital Signs:  Patient profile:   77 year old female Height:      60 inches Weight:      105.50 pounds O2 Sat:      99 % on Room air Temp:     98.3 degrees F oral Pulse rate:   85 / minute BP sitting:   130 / 60  (right arm)  Vitals Entered By: Lucious Groves (August 10, 2009 1:17 PM)  O2 Flow:  Room air CC: Rtn ov--Bronchitis not any better and pt is hurting on her right side. Pt is producing yellow mucous./kb Is Patient Diabetic? No Pain Assessment Patient in pain? yes     Location: right side--ribs Intensity: 8 Type: aching   Primary Care Provider:  [name] DO  CC:  Rtn ov--Bronchitis not any better and pt is hurting on her right side. Pt is producing yellow mucous./kb.  History of Present Illness: 77 y/o for follow up she finished avelox but still coughing - yellowish sputum  severe right lower rib pain has recurred.   pain with changin position and palpation no rash she has hx of breast cancer prev cxr was negative  Current Medications (verified): 1)  Coumadin 4 Mg Tabs (Warfarin Sodium) .... Take 1 Tablet Monday, Wednesday, Friday 2)  Cardizem 120 Mg Tabs (Diltiazem Hcl) .... Take 1 Tablet By Mouth Once A Day 3)  Synthroid 100 Mcg Tabs (Levothyroxine Sodium) .... Take 1 Tablet By Mouth Every Morning 4)  Lipitor 20 Mg Tabs (Atorvastatin Calcium) .... Take 1 Tab By Mouth At Bedtime 5)  Quinapril Hcl 10 Mg Tabs (Quinapril Hcl) .... Take 1 Tablet By Mouth Once A Day 6)  Flovent Hfa 44 Mcg/act Aero (Fluticasone Propionate  Hfa) .... 2 Puffs By Mouth Once Daily 7)  Aspirin 81 Mg  Tbec (Aspirin) .... One By Mouth Every Day 8)  Avelox 400 Mg/279ml Soln (Moxifloxacin Hcl in Nacl) .... Once Daily 9)  Coumadin 2 Mg Tabs (Warfarin Sodium) .Marland Kitchen.. 1 Tab On Saturday, Sunday, Tuesday, Thursday 10)  Mirtazapine 15 Mg Tabs (Mirtazapine) .... One Half Tab By Mouth At Bedtime 11)  Aricept 5 Mg Tabs (Donepezil Hcl) .... One By Mouth  Qd  Allergies (verified): 1)  ! Percocet 2)  ! Morphine Sulfate (Morphine Sulfate)  Past History:  Past Medical History: Current Problems:  CORONARY ARTERY DISEASE (ICD-414.00) CEREBROVASCULAR ACCIDENT, HX OF (ICD-V12.50) ATRIAL FIBRILLATION (ICD-427.31)  HYPERLIPIDEMIA (ICD-272.4) COPD (ICD-496)  EPISTAXIS, RECURRENT (ICD-784.7)  OSTEOPOROSIS (ICD-733.00)  HYPOTHYROIDISM (ICD-244.9) PULMONARY EMBOLISM, HX OF (ICD-V12.51) DIVERTICULITIS, HX OF (ICD-V12.79) H/O peptic ulcer disease CERVICAL CANCER, HX OF (ICD-V10.41) BREAST CANCER, HX OF (ICD-V10.3) ASTHMA (ICD-493.90) ANEMIA-IRON DEFICIENCY (ICD-280.9) Blood transfusion  Hx of DVT Hx LBBB Severe MR; MV repair 7/10 Severe Aortic Stenosis  Past Surgical History: H/O thyroidectomy due to cancer Total hip replacement Hysterectomy  Maectomy  Mitral valve repair  Left Eye Implant    S/P Pacemaker  Family History: Family History Breast cancer 1st degree relative <50 Family History of Colon CA 1st degree relative <60 Family History Diabetes 1st degree relative Family History High cholesterol Family History Hypertension  Mother with unknown heart disease       Social History: Retired Widow  1 daughter 90  Tobacco Use - Former.  Alcohol Use - no      Physical Exam  General:  alert, well-developed, and well-nourished.   Chest Wall:  significant right lower chest wall tenderness Lungs:  normal respiratory effort and  normal breath sounds.   Heart:  normal rate, regular rhythm, and no gallop.  SEM 3/6 RSB Extremities:  No lower extremity edema    Impression & Recommendations:  Problem # 1:  BRONCHITIS, ACUTE (ICD-466.0) Pt may have residual bronchits vs sinusitis.  tx with ceftin.  she has swallowing eval scheduled.  Her updated medication list for this problem includes:    Flovent Hfa 44 Mcg/act Aero (Fluticasone propionate  hfa) .Marland Kitchen... 2 puffs by mouth once daily    Avelox 400 Mg/223ml Soln (Moxifloxacin  hcl in nacl) ..... Once daily    Cefuroxime Axetil 500 Mg Tabs (Cefuroxime axetil) ..... One by mouth  two times a day  Orders: T-2 View CXR, Same Day ([id number])  Problem # 2:  RIB PAIN, RIGHT SIDED (ICD-786.50) Pt with severe right rib pain.  hx of breast cancer.  arrange CT of chest and bone scan.  If metastatis dz - we discussed palliative radiation therapy.  she does not tolerate narcotics.  trial of lidoderm patches. Orders: Radiology Referral (Radiology) Radiology Referral (Radiology)  Problem # 3:  COUMADIN THERAPY (ICD-V58.61) she was prev subtherapeutic.  repeat INR  Orders: T-Protime, Auto ([id number])  Complete Medication List: 1)  Coumadin 4 Mg Tabs (Warfarin sodium) .... Take 1 tablet monday, wednesday, friday 2)  Cardizem 120 Mg Tabs (Diltiazem hcl) .... Take 1 tablet by mouth once a day 3)  Synthroid 100 Mcg Tabs (Levothyroxine sodium) .... Take 1 tablet by mouth every morning 4)  Lipitor 20 Mg Tabs (Atorvastatin calcium) .... Take 1 tab by mouth at bedtime 5)  Quinapril Hcl 10 Mg Tabs (Quinapril hcl) .... Take 1 tablet by mouth once a day 6)  Flovent Hfa 44 Mcg/act Aero (Fluticasone propionate  hfa) .... 2 puffs by mouth once daily 7)  Aspirin 81 Mg Tbec (Aspirin) .... One by mouth every day 8)  Avelox 400 Mg/29ml Soln (Moxifloxacin hcl in nacl) .... Once daily 9)  Coumadin 2 Mg Tabs (Warfarin sodium) .Marland Kitchen.. 1 tab on saturday, sunday, tuesday, thursday 10)  Mirtazapine 15 Mg Tabs (Mirtazapine) .... One half tab by mouth at bedtime 11)  Aricept 5 Mg Tabs (Donepezil hcl) .... One by mouth qd 12)  Cefuroxime Axetil 500 Mg Tabs (Cefuroxime axetil) .... One by mouth  two times a day 13)  Lidoderm 5 % Ptch (Lidocaine) .... Apply 12 hrs per day  Patient Instructions: 1)  Please schedule a follow-up appointment in 2 weeks. Prescriptions: LIDODERM 5 % PTCH (LIDOCAINE) apply 12 hrs per day  #14 x 0   Entered and Authorized by:   D. Robert Yoo DO   Signed by:   D. Robert  Yoo DO on 08/10/2009   Method used:   [facility]        [facility] [address] [id number]       [address]       [place], [place]  [address]       Ph: [phone] or [phone]       Fax: [phone]   RxID:   [id number] CEFUROXIME AXETIL 500 MG TABS (CEFUROXIME AXETIL) one by mouth  two times a day  #14 x 0   Entered and Authorized by:   D. Robert Yoo DO   Signed by:   D. Robert Yoo DO on 08/10/2009   Method used:   [facility]        [facility]  [address] [id number]       [address]  [address], [place]  [address]       Ph: [phone] or [phone]       Fax: [phone]   RxID:   [id number]

## 2010-09-07 NOTE — Miscellaneous (Signed)
Summary: Med Orders/[facility]  Med Orders/[facility]   Imported By: Lanelle Bal 03/04/2010 11:31:42  _____________________________________________________________________  External Attachment:    Type:   Image     Comment:   External Document

## 2010-09-07 NOTE — Progress Notes (Signed)
Summary: 05/05/10 appt   Phone Note Call from Patient Call back at [phone]   Caller: Daughter Reason for Call: Talk to Nurse Summary of Call: Rita Miller wanted to know how her mother's appt went this morning, she could not get off work to come Initial call taken by: Lannette Donath,  May 05, 2010 11:11 AM  Follow-up for Phone Call        discussed pt's condition with her daughter Rita Miller Follow-up by: [name] DO,  May 07, 2010 5:08 PM

## 2010-09-07 NOTE — Assessment & Plan Note (Signed)
Summary: 2wk f/u lea at 2pm/sl   Visit Type:  Follow-up Primary Primo Innis:  Rita Spry DO   History of Present Illness: 77 yo WF with history of CAD, PVD s/p prior stenting of the left SFA in 2010, atrial fibrillation, severe Aortic stenosis, s/p mitral valve repair, breast cancer and vascular dementia here to see me today for PV follow up.  Her cardiac issues are followed by Dr. Jens Som. She has a non-healing wound of the left great toe. Her PV history has been extensive over the last 15 months. I have done an extensive review of the data available and tried to piece it together. She moved here from [place] last year to live with her daughter. It appears that she had a LE angiogram in [place] on 03/02/09. The operative report describes total occlusion of the left  SFA and popliteal with PTCA and placement of three Viabahn stents. PTCA of the left external iliac artery also performed. Her right lower ext had nonobstructive disease. She was seen in the [facility] in January 2011 and had non-invasive imaging with ABI of 0.92 on the right (PTA occluded) and ABI of 0.47 on the left (PTA occluded, Left SFA totally occluded).  Her left foot began to turn blue and she developed an ulcer on her left great toe. She was seen by Dr. Tollie Pizza of [facility] on 12/14/09 and he was concerned. An angiogram was arranged bu this report is not availbable in our records. Her daughter tells me that the angiogram showed total occlusion of the stents in the left SFA and that Dr. Garner Nash could not cross this lesion. She was then seen by Dr. Tressa Busman (?vascular surgeon) in [facility] and he did not feel that she was a good candidate for a fem-pop bypass given her age and comorbidities including dementia. She was told that an amputation may be necessary. At this time, her left foot is still painful and the left great toenail has been removed. She has no claudication. Her left foot has been reddish  with some blue discoloration. The toe has been followed in her living facility with local wound care. No fever or chills.  Recent CT of the foot did not show osteomyelitis.  She has been referrd to the wound care clinic for local care.   At the first visit, I spoke to both the patient  and her daughter about possible angiography of the left leg. She has had failed PTA attempt at [facility] of the occluded stents in the left SFA. She was told there that amputation of the toe was the only option. ABI in our office today with left ABI 0.38. Her clinical status is unchanged. She is here today with an attendant from the nursing home. No family present.   Current Medications (verified): 1)  Coumadin 3 Mg Tabs (Warfarin Sodium) .... One By Mouth Once Daily 2)  Cartia Xt 240 Mg Xr24h-Cap (Diltiazem Hcl Coated Beads) .... One By Mouth Once Daily 3)  Synthroid 100 Mcg Tabs (Levothyroxine Sodium) .... Take 1 Tablet By Mouth Every Morning 4)  Flovent Hfa 44 Mcg/act Aero (Fluticasone Propionate  Hfa) .... 2 Puffs By Mouth Once Daily 5)  Aspirin 81 Mg  Tbec (Aspirin) .... One By Mouth Every Day 6)  Namenda 5 Mg Tabs (Memantine Hcl) .... One By Mouth Two Times A Day 7)  Exelon 4.6 Mg/24hr Pt24 (Rivastigmine) .... Apply New Patch  Daily 8)  Lidoderm 5 %  Ptch (Lidocaine) .... Apply Patch To Right Chest Wall 12 Hrs Per Day As Needed For Pain 9)  Albuterol Sulfate (2.5 Mg/6ml) 0.083% Nebu (Albuterol Sulfate) .... Qid As Needed For Weheezing or Sob 10)  Melatonin 3 Mg Tabs (Melatonin) .... Take 1 Tab By Mouth At Bedtime As Needed 11)  Pedi-Dri 100000 Unit/gm Powd (Nystatin) .... Apply Three Times A Day Underneath Left Breast 12)  Losartan Potassium 25 Mg Tabs (Losartan Potassium) .... One By Mouth Once Daily 13)  Crestor 20 Mg Tabs (Rosuvastatin Calcium) .... One By Mouth Qd 14)  Triamcinolone Acetonide 0.1 % Crea (Triamcinolone Acetonide) .... Apply Two Times A Day To Left Side of Neck X 1 Week 15)   Levaquin 250 Mg Tabs (Levofloxacin) .... One By Mouth Once Daily 16)  Hydrocodone-Acetaminophen 2.5-500 Mg Tabs (Hydrocodone-Acetaminophen) .... 1/2 Tab By Mouth Two Times A Day As Needed  Allergies (verified): 1)  ! Percocet 2)  ! Morphine Sulfate (Morphine Sulfate) 3)  Aricept  Past History:  Past Medical History: Reviewed history from 05/20/2010 and no changes required. CORONARY ARTERY DISEASE (ICD-414.00) CEREBROVASCULAR ACCIDENT, HX OF (ICD-V12.50) ATRIAL FIBRILLATION (ICD-427.31)  HYPERLIPIDEMIA (ICD-272.4)   COPD (ICD-496)        EPISTAXIS, RECURRENT (ICD-784.7)    OSTEOPOROSIS (ICD-733.00)   HYPOTHYROIDISM (ICD-244.9)   PULMONARY EMBOLISM, HX OF (ICD-V12.51) DIVERTICULITIS, HX OF (ICD-V12.79) H/O peptic ulcer disease CERVICAL CANCER, HX OF (ICD-V10.41) BREAST CANCER, HX OF (ICD-V10.3) ASTHMA (ICD-493.90) ANEMIA-IRON DEFICIENCY (ICD-280.9) Blood transfusion  Hx of DVT Hx LBBB Severe MR; MV repair 7/10 Severe Aortic Stenosis Bradycardia, s/p pacemaker implant Severe PAD - left leg with occluded Viabahn stents x 3 left SFA.  Past Surgical History: Reviewed history from 05/20/2010 and no changes required. H/O thyroidectomy due to cancer Total hip replacement Hysterectomy    Mastectomy      Mitral valve repair    Left Eye Implant    S/P Pacemaker      Family History: Reviewed history from 05/14/2010 and no changes required. Family History Breast cancer 1st degree relative <50 Family History of Colon CA 1st degree relative <60 Family History Diabetes 1st degree relative Family History High cholesterol Family History Hypertension   Mother with unknown heart disease                  Social History: Reviewed history from 05/20/2010 and no changes required. Retired Widow  1 daughter   Tobacco Use - Former.    Alcohol Use - no                Review of Systems       The patient complains of joint pain and leg swelling.  The patient denies fatigue,  malaise, fever, weight gain/loss, vision loss, decreased hearing, hoarseness, chest pain, palpitations, shortness of breath, prolonged cough, wheezing, sleep apnea, coughing up blood, abdominal pain, blood in stool, nausea, vomiting, diarrhea, heartburn, incontinence, blood in urine, muscle weakness, rash, skin lesions, headache, fainting, dizziness, depression, anxiety, enlarged lymph nodes, easy bruising or bleeding, and environmental allergies.    Vital Signs:  Patient profile:   77 year old female Pulse rate:   73 / minute Resp:     12 per minute BP sitting:   122 / 60  Vitals Entered By: Burnett Kanaris, CNA (May 27, 2010 2:57 PM)  Physical Exam  General:  General: Thin, cachectic, NAD HEENT: OP clear, mucus membranes moist SKIN: warm, dry. Rubor over left foot Neuro: No focal deficits Musculoskeletal: Muscle strength 5/5 all  ext Psychiatric: Mood and affect normal Neck: No JVD, no carotid bruits, no thyromegaly, no lymphadenopathy. Lungs:Clear bilaterally, no wheezes, rhonci, crackles CV: RRR Abdomen: soft, NT, ND, BS present Extremities: The left foot is red. The left toenail has been extracted. No erythema or expressable discharge. Non-palpable pulses bilateral DP/PT. Left foot cool to touch. 1+ edema to left ankle.     ABI's  Procedure date:  05/27/2010  Findings:      ABI 0.90 on the right, 0.38 on the left.   PPM Specifications Following MD:  Hillis Range, MD     PPM Vendor:  [employer]     PPM Model Number:  [id number]     PPM Serial Number:  [id number] PPM DOI:  07/12/2006     PPM Implanting MD:  NOT IMPLANTED HERE  Lead 1    Location: [facility]     DOI: 01/12/1998     Model #: [id number]     Serial #: [id number]     Status: active Lead 2    Location: RV     DOI: 01/12/1998     Model #: [id number]     Serial #: [id number]     Status: active   Indications:  AFIB   PPM Follow Up Pacer Dependent:  No      Episodes Coumadin:  Yes  Parameters Mode:  DDD     Lower Rate Limit:  70      Upper Rate Limit:  130 Paced AV Delay:  310     Sensed AV Delay:  310  Impression & Recommendations:  Problem # 1:  PVD (ICD-443.9) Her left toe is at risk for tissue loss. I am worried that her left toe wound will not heal with her compromised arterial flow. She has had failed attempt at intervention of the left leg in [facility]. I am willing to bring her in for angiogram and attempt to open the vessel and reestablish blood flow. The patient is not excited about this option. I have spoken to her daughter on the phone and she also does not wish to pursue another invasive study. They will discuss as a family and let us know. If we plan the procedure, she will need baseline labs pre-procedure. Continue local wound care and antibiotics per Dr. Artist Pais.   Patient Instructions: 1)  Your physician recommends that you schedule a follow-up appointment. Will be determined. 2)  Your physician recommends that you continue on your current medications as directed. Please refer to the Current Medication list given to you today. 3)  Please call us to schedule your Peripheral Vascular procedure for Nov. 2nd or Nov.9th.

## 2010-09-07 NOTE — Progress Notes (Signed)
Summary: COUMADEN LEVEL?  Phone Note Call from Patient   Caller: patient daughter [name] Call For: YOO  Summary of Call: PATIENT WAS IN ER AT [facility].  THEY DIAGNOSED HER WITH PLURASEY   THEY TOLD TONI TO CHECK WITH DR Artist Pais ABOUT THE COUMADEN LEVEL.  PLEASE CALL DAUGHTER [phone] Initial call taken by: Roselle Locus,  February 19, 2010 1:19 PM  Follow-up for Phone Call        I spoke with [name] INR 7/14 - 2.06.  pt advised to continue same dose of coumadin - 2mg .  pt will in next week Thurs for OV - will plan on repeating INR next week Follow-up by: [name] DO,  February 19, 2010 5:00 PM    New/Updated Medications: COUMADIN 2 MG TABS (WARFARIN SODIUM) one by mouth once daily

## 2010-09-07 NOTE — Progress Notes (Signed)
Summary: Lab Results & Follow up   Phone Note Outgoing Call   Summary of Call: call pt - CT of foot does not suggestive osteomyelitis.  (bone infection) Initial call taken by: [name] DO,  May 14, 2010 4:30 PM  Follow-up for Phone Call        call placed to patients [phone], patients daughter [name] she has been informed per Dr Artist Pais instructions. She was informed that Dr Artist Pais has requested her presence at her mothers next appointment. She has scheduled a office visit for Thursday 05/20/2010 @ 3:30pm Follow-up by: [name],  May 17, 2010 8:39 AM

## 2010-09-07 NOTE — Miscellaneous (Signed)
Summary: SN & PT Orders/[facility]  SN & PT Orders/[facility]   Imported By: Lanelle Bal 11/17/2009 14:38:36  _____________________________________________________________________  External Attachment:    Type:   Image     Comment:   External Document

## 2010-09-07 NOTE — Progress Notes (Signed)
Summary: Toe wound  Phone Note From Other Clinic Call back at [phone]   [facility] Dr Artist Pais Summary of Call: Aggie Cosier from [facility] called and left voice message stating patient has obtained a wound on her left middle toe joint of .5x.2 x.1, tissue is pink and cool to touch in area around toe. Patient is scheduled for followed up on 9/28 Dr Artist Pais , and she wanted to make Dr Artist Pais aware Initial call taken by: [name],  May 03, 2010 12:18 PM  Follow-up for Phone Call        I suggest we arrange referral to Dr. Excell Seltzer.  I would like his opinion whether this pt needs or can survive left leg amputation  vs palliative care Follow-up by: [name] DO,  May 03, 2010 5:46 PM  Additional Follow-up for Phone Call Additional follow up Details #1::        No appt available with Dr Excell Seltzer until Oct 25-- appt made with Dr Clifton James   Oct 4th   spoke with Eunice Blase at [facility]   , appt confirmed  Additional Follow-up by: Darral Dash,  May 04, 2010 9:17 AM

## 2010-09-07 NOTE — Progress Notes (Signed)
Summary: needs fl2 corrected   Phone Note Call from Patient Call back at [phone]   Caller: patient dasughter Call For: Rita Miller  Summary of Call: The FL2 form should have said secured unit not ICS. Please change the check box and initial the change.  She needs to pick up the form asap. The hospital said her coumaden is at a therapuetic level.  Is 2mg  a therapuetic level in Dr Olegario Messier opinion.    Initial call taken by: Roselle Locus,  February 16, 2010 11:00 AM  Follow-up for Phone Call        call returned to patients daughter Rita Miller. She states the form that was completed is needed for a secure unit and not ICS. [name] was informed the FL2 that was completed was for the facility that she requested.  She states that she is shopping for the  best Secure facility for patient and will need a corrected form. [name] was advised that the form that was completed was the skilled nursing facility that she chose for patient, which patient did not meet entrance qualifications. She was advised once she locates the facility then she would need to obtain a FL2 form from that facility and brought to our office for completion if needed. Patient verablized understanding  Follow-up by: [name],  February 16, 2010 11:39 AM

## 2010-09-07 NOTE — Assessment & Plan Note (Signed)
Summary: COUGH & SOB/KH   Vital Signs:  Patient Profile:   77 Years Old Female CC:      cough, wheezing, SOB X 2 days Height:     60 inches Weight:      104 pounds O2 Sat:      99 % O2 treatment:    Room Air Temp:     96.5 degrees F oral Pulse rate:   87 / minute Resp:     20 per minute BP sitting:   140 / 66  (right arm)  Vitals Entered By: Lajean Saver, RN                  Prior Medication List:  COUMADIN 4 MG TABS (WARFARIN SODIUM) one by mouth once daily CARDIZEM 120 MG TABS (DILTIAZEM HCL) Take 1 tablet by mouth once a day SYNTHROID 100 MCG TABS (LEVOTHYROXINE SODIUM) Take 1 tablet by mouth every morning QUINAPRIL HCL 10 MG TABS (QUINAPRIL HCL) Take 1 tablet by mouth once a day FLOVENT HFA 44 MCG/ACT AERO (FLUTICASONE PROPIONATE  HFA) 2 puffs by mouth once daily ASPIRIN 81 MG  TBEC (ASPIRIN) one by mouth every day MIRTAZAPINE 15 MG TABS (MIRTAZAPINE) one  tab by mouth at bedtime NAMENDA 5 MG TABS (MEMANTINE HCL) one by mouth two times a day LIDODERM 5 % PTCH (LIDOCAINE) UAD   Current Allergies (reviewed today): ! PERCOCET ! MORPHINE SULFATE (MORPHINE SULFATE) ARICEPTHistory of Present Illness Chief Complaint: cough, wheezing, SOB X 2 days History of Present Illness: She has been having trouble breathing and hea ing noises across the street. She has been coughing for 3-4 days. Having trouble w/ panuic attacks for two weeksThey state she heard noises last night and had more trouble sleeping last night as well..   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 (ICD-618.5) PROLAPSE OF VAGINAL VAULT AFTER HYSTERECTOMY (ICD-618.5) UNSPECIFIED DEBILITY (ICD-799.3) MEMORY LOSS (ICD-780.93) BRONCHITIS, ACUTE (ICD-466.0) COUMADIN THERAPY (ICD-V58.61) PACEMAKER, PERMANENT (ICD-V45.01) AORTIC VALVE DISORDERS (ICD-424.1) MITRAL VALVE REPLACEMENT, HX OF (ICD-V15.1) CORONARY  ARTERY DISEASE (ICD-414.00) CEREBROVASCULAR ACCIDENT, HX OF (ICD-V12.50) ATRIAL FIBRILLATION (ICD-427.31) HYPERLIPIDEMIA (ICD-272.4) COPD (ICD-496) EPISTAXIS, RECURRENT (ICD-784.7) RIB PAIN, RIGHT SIDED (ICD-786.50) TOBACCO USE, QUIT (ICD-V15.82) OSTEOPOROSIS (ICD-733.00) HYPOTHYROIDISM (ICD-244.9) FAMILY HISTORY DIABETES 1ST DEGREE RELATIVE (ICD-V18.0) FAMILY HISTORY OF COLON CA 1ST DEGREE RELATIVE <60 (ICD-V16.0) FAMILY HISTORY BREAST CANCER 1ST DEGREE RELATIVE <50 (ICD-V16.3) PULMONARY EMBOLISM, HX OF (ICD-V12.51) DIVERTICULITIS, HX OF (ICD-V12.79) CERVICAL CANCER, HX OF (ICD-V10.41) BREAST CANCER, HX OF (ICD-V10.3) ASTHMA (ICD-493.90) ANEMIA-IRON DEFICIENCY (ICD-280.9)   Current Meds COUMADIN 4 MG TABS (WARFARIN SODIUM) one by mouth once daily CARDIZEM 120 MG TABS (DILTIAZEM HCL) Take 1 tablet by mouth once a day SYNTHROID 100 MCG TABS (LEVOTHYROXINE SODIUM) Take 1 tablet by mouth every morning QUINAPRIL HCL 10 MG TABS (QUINAPRIL HCL) Take 1 tablet by mouth once a day FLOVENT HFA 44 MCG/ACT AERO (FLUTICASONE PROPIONATE  HFA) 2 puffs by mouth once daily ASPIRIN 81 MG  TBEC (ASPIRIN) one by mouth every day MIRTAZAPINE 15 MG TABS (MIRTAZAPINE) one  tab by mouth at bedtime NAMENDA 5 MG TABS (MEMANTINE HCL) one by mouth two times a day LIDODERM 5 % PTCH (LIDOCAINE) UAD COMBIVENT 103-18 MCG/ACT AERO (ALBUTEROL-IPRATROPIUM) 2 inh. q4-6h as needed COMBIVENT 103-18 MCG/ACT AERO (ALBUTEROL-IPRATROPIUM) 2 inh. q4-6h as needed CEFUROXIME AXETIL 500 MG  TABS (CEFUROXIME AXETIL) 1 by mouth 2 times daily TUSSIONEX PENNKINETIC ER 8-10 MG/5ML LQCR (CHLORPHENIRAMINE-HYDROCODONE) sig 1/2tsp to 1 tsp by mouth  twice aday prn fo cough PLEASE START W/ 1/2 A TSP  REVIEW OF SYSTEMS Constitutional Symptoms      Denies fever, chills, night sweats, weight loss, weight gain, and fatigue.  Eyes       Denies change in vision, eye pain, eye discharge, glasses, contact lenses, and eye  surgery. Ear/Nose/Throat/Mouth       Complains of frequent runny nose.      Denies hearing loss/aids, change in hearing, ear pain, ear discharge, dizziness, frequent nose bleeds, sinus problems, sore throat, hoarseness, and tooth pain or bleeding.  Respiratory       Complains of dry cough, wheezing, and shortness of breath.      Denies productive cough, asthma, bronchitis, and emphysema/COPD.  Cardiovascular       Denies murmurs, chest pain, and tires easily with exhertion.    Gastrointestinal       Complains of diarrhea.      Denies stomach pain, nausea/vomiting, constipation, blood in bowel movements, and indigestion.      Comments: X 2 weeks Genitourniary       Denies painful urination, kidney stones, and loss of urinary control. Neurological       Denies paralysis, seizures, and fainting/blackouts. Musculoskeletal       Denies muscle pain, joint pain, joint stiffness, decreased range of motion, redness, swelling, muscle weakness, and gout.  Skin       Denies bruising, unusual mles/lumps or sores, and hair/skin or nail changes.  Psych       Denies mood changes, temper/anger issues, anxiety/stress, speech problems, depression, and sleep problems.      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   Alcohol Use - no      Risk Factors: Alcohol Use: 0 (05/25/2009) Caffeine Use: None (05/25/2009) Exercise: no (05/25/2009)  Risk Factors: Smoking Status: quit (06/24/2009) Packs/Day: 0.5 (05/25/2009)  Past Medical History: Reviewed history from 09/11/2009 and no changes required. CORONARY  ARTERY DISEASE (ICD-414.00) CEREBROVASCULAR ACCIDENT, HX OF (ICD-V12.50) ATRIAL FIBRILLATION (ICD-427.31)  HYPERLIPIDEMIA (ICD-272.4) COPD (ICD-496)   EPISTAXIS, RECURRENT (ICD-784.7)  OSTEOPOROSIS (ICD-733.00)  HYPOTHYROIDISM (ICD-244.9) PULMONARY EMBOLISM, HX OF (ICD-V12.51) DIVERTICULITIS, HX OF (ICD-V12.79) H/O peptic ulcer disease CERVICAL CANCER, HX OF (ICD-V10.41) BREAST CANCER, HX OF (ICD-V10.3) ASTHMA (ICD-493.90) ANEMIA-IRON DEFICIENCY (ICD-280.9) Blood transfusion  Hx of DVT Hx LBBB Severe MR; MV repair 7/10 Severe Aortic Stenosis Bradycardia, s/p pacemaker implant  Past Surgical History: Reviewed history from 09/03/2009 and no changes required. H/O thyroidectomy due to cancer Total hip replacement Hysterectomy  Maectomy  Mitral valve repair  Left Eye Implant    S/P Pacemaker   Family History: Reviewed history from 09/03/2009 and no changes required. Family History Breast cancer 1st degree relative <50 Family History of Colon CA 1st degree relative <60 Family History Diabetes 1st degree relative Family History High cholesterol Family History Hypertension   Mother with unknown heart disease       Social History: Reviewed history from 09/03/2009 and no changes required. Retired Widow  1 daughter  Tobacco Use - Former.   Alcohol Use - no     Physical Exam General appearance: cachetic WF appears  in overall poor health Head: normocephalic, atraumatic Neck: neck supple,  trachea midline, no masses Chest/Lungs: scattered wheezes and ronchi  throughout all lobes Heart: heart sound distant Skin: no obvious rashes or lesions Assessment New Problems: BRONCHITIS, ACUTE WITH BRONCHOSPASM (ICD-466.0)  acute exacerbation of COPD/ bronchitis  Plan New Medications/Changes: Sandria Senter ER 8-10 MG/5ML LQCR (CHLORPHENIRAMINE-HYDROCODONE) sig 1/2tsp to 1 tsp by mouth twice aday prn fo cough PLEASE START W/ 1/2 A TSP  #4 floz x 0, 09/23/2009, Hassan Rowan MD CEFUROXIME AXETIL 500 MG  TABS (CEFUROXIME AXETIL) 1 by mouth 2 times daily  #20 x 0, 09/23/2009, Hassan Rowan MD COMBIVENT 103-18 MCG/ACT AERO (ALBUTEROL-IPRATROPIUM) 2 inh. q4-6h as needed  #1 x 0, 09/23/2009, Hassan Rowan MD COMBIVENT 103-18 MCG/ACT AERO (ALBUTEROL-IPRATROPIUM) 2 inh. q4-6h as needed  #1 x 0, 09/23/2009, Hassan Rowan MD  New Orders: T-Chest x-ray, 2 views [71020] Nebulizer Tx [phone] Solumedrol up to 125mg  [J2930] Admin of Therapeutic Inj  intramuscular or subcutaneous [96372] Albuterol Sulfate Sol 1mg  unit dose [U0454] Ipratropium inhalation sol. unit dose [U9811] New Patient Level III [91478] Planning Comments:   as below  Follow Up: Follow up in 2-3 days if no improvement, Follow up with Primary Physician  The patient and/or caregiver has been counseled thoroughly with regard to medications prescribed including dosage, schedule, interactions, rationale for use, and possible side effects and they verbalize understanding.  Diagnoses and expected course of recovery discussed and will return if not improved as expected or if the condition worsens. Patient and/or caregiver verbalized understanding.  Prescriptions: Sandria Senter ER 8-10 MG/5ML LQCR (CHLORPHENIRAMINE-HYDROCODONE) sig 1/2tsp to 1 tsp by mouth twice aday prn fo cough PLEASE START W/ 1/2 A TSP  #4 floz x 0   Entered and Authorized by:   Hassan Rowan MD   Signed by:   Hassan Rowan MD on 09/23/2009   Method used:   Printed then faxed to ...       [facility]  [place] [phone][facility] (retail)       [address] [place], [place]  [address]       Ph: [phone] or [phone]       Fax: [phone]   RxID:   [id number] CEFUROXIME AXETIL 500 MG  TABS (CEFUROXIME AXETIL) 1 by mouth 2 times daily  #20 x 0   Entered and Authorized by:   Hassan Rowan MD   Signed by:   Hassan Rowan MD on 09/23/2009   Method used:   Printed then faxed to ...       [facility]  [place] [phone][facility] ([facility]ail[facility]       [address] [place], [place]  [address]       Ph: [phone] or [phone]       Fax: [phone]   RxID:   [id number] COMBIVENT 103-18 MCG/ACT AERO (ALBUTEROL-IPRATROPIUM) 2 inh. q4-6h as needed  #1 x 0   Entered and Authorized by:   Hassan Rowan MD   Signed by:   Hassan Rowan MD on 09/23/2009   Method used:   Printed then faxed to ...       [facility]  [place] [phone]* (retail)       [address] [place], [place]  [address]       Ph: [phone] or [phone]       Fax: [phone]   RxID:   [id number]  COMBIVENT 103-18 MCG/ACT AERO (ALBUTEROL-IPRATROPIUM) 2 inh. q4-6h as needed  #1 x 0   Entered and Authorized by:   Hassan Rowan MD   Signed by:   Hassan Rowan MD on 09/23/2009   Method used:   [facility]ronically to        [facility] [phone][facility] (retail)       [address] [place], [place]  [address]       Ph: [phone] or [phone]       Fax: [phone]   RxID:   [id number]   Patient Instructions: 1)  Please schedule an appointment with your primary doctor in :3-5 days . The tussionex can cause some sedation so please use only 1/2 tsp at first.  2)  If symptoms worse please go to local ED   Medication Administration  Injection # 1:    Medication: Solumedrol up to 125mg     Diagnosis: BRONCHITIS, ACUTE WITH BRONCHOSPASM (ICD-466.0)    Route: IM    Site: RUOQ gluteus    Exp Date: 04/08/2012    Lot #: [id number]    Mfr: Pfizer    Patient tolerated injection without complications    Given by: Emilio Math (September 23, 2009 12:09 PM)  Medication # 1:    Medication: Ipratropium inhalation sol. unit dose    Diagnosis: BRONCHITIS, ACUTE WITH BRONCHOSPASM (ICD-466.0)    Dose: 0.5    Route: inhaled    Exp Date: 06/08/2010    Lot #: [id number]    Mfr: Mylan    Patient tolerated medication without complications    Given by: Emilio Math (September 23, 2009 12:11 PM)  Medication # 2:    Medication: Albuterol Sulfate Sol 1mg  unit dose    Diagnosis: BRONCHITIS, ACUTE WITH  BRONCHOSPASM (ICD-466.0)    Dose: 3.0    Route: inhaled    Exp Date: 06/08/2010    Lot #: [id number]    Mfr: Mylan    Patient tolerated medication without complications    Given by: Emilio Math (September 23, 2009 12:12 PM)  Orders Added: 1)  T-Chest x-ray, 2 views [71020] 2)  Nebulizer Tx [94640] 3)  Solumedrol up to 125mg  [J2930] 4)  Admin of Therapeutic Inj  intramuscular or subcutaneous [96372] 5)  Albuterol Sulfate Sol 1mg  unit dose [J7613] 6)  Ipratropium inhalation sol. unit dose [J7644] 7)  New Patient Level III [38756]

## 2010-09-07 NOTE — Progress Notes (Signed)
Summary: Marguerita Merles SEE PATIENT SINCE SHE RET TO DAYCARE  Phone Note Call from Patient   Caller: [facility]  Call For: Decklyn Hyder  Summary of Call: THEY WILL NOT SEE THE PATIENT IF SHE GOES TO DAYCARE.  IT IS A DUPLICATION OF SERVICES AND THEY WILL NOT BE ABLE TO THE PATIENT ANYMORE  Initial call taken by: Roselle Locus,  November 12, 2009 12:24 PM  Follow-up for Phone Call        noted Follow-up by: [name] DO,  November 16, 2009 2:42 PM

## 2010-09-07 NOTE — Letter (Signed)
Summary: [facility]   Imported By: Lanelle Bal 11/04/2009 09:58:48  _____________________________________________________________________  External Attachment:    Type:   Image     Comment:   External Document

## 2010-09-07 NOTE — Consult Note (Signed)
Summary: [facility]   Imported By: Lanelle Bal 12/23/2009 08:49:49  _____________________________________________________________________  External Attachment:    Type:   Image     Comment:   External Document

## 2010-09-07 NOTE — Miscellaneous (Signed)
Summary: Physician Visit, [place]-2/[facility]  Physician Visit, [place]-2/[facility]   Imported By: Maryln Gottron 03/16/2010 14:19:57  _____________________________________________________________________  External Attachment:    Type:   Image     Comment:   External Document

## 2010-09-07 NOTE — Consult Note (Signed)
Summary: [facility]  [facility]   Imported By: Lanelle Bal 10/01/2009 10:00:48  _____________________________________________________________________  External Attachment:    Type:   Image     Comment:   External Document

## 2010-09-07 NOTE — Progress Notes (Signed)
Summary: INR  Phone Note Outgoing Call   Summary of Call: call pt - INR 7.95.  stop coumadin.  If she has sign of bleeding, she must go to ER.  otherwise - come back to office on monday for repeat INR. Pt should eat green leafy vegetables daily. Initial call taken by: [name] DO,  October 30, 2009 1:11 PM  Follow-up for Phone Call        call placed to patients daughter Dossie Arbour @ [phone], she was informed per Dr Artist Pais instructions. She states she wil bring patient in for blood draw on Monday, but patient could not eat leafy green vegtables due to her diverticulosis Follow-up by: [name],  October 30, 2009 1:19 PM  Additional Follow-up for Phone Call Additional follow up Details #1::        call returned to patients daughter [phone], she was informed per Dr Artist Pais to have patient try the green leafy vegatables as instructed. She states that she was in the store now and would purchase some for her. Additional Follow-up by: [name],  October 30, 2009 2:52 PM

## 2010-09-07 NOTE — Miscellaneous (Signed)
Summary: Physician's [facility]  Physician's Orders/[facility]   Imported By: Maryln Gottron 06/04/2010 14:52:31  _____________________________________________________________________  External Attachment:    Type:   Image     Comment:   External Document

## 2010-09-07 NOTE — Progress Notes (Signed)
Summary: side effects to Fentanyl?  Phone Note Other Incoming   Caller: [facility] Ext [phone] Summary of Call: Received call from Rita Miller stating she was unable to obtain blood, pt dehydrated. Please advise what pt should do about lab order? Rita Miller CMA Duncan Dull)  April 21, 2010 10:05 AM   Follow-up for Phone Call        have pt go to [facility] office for lab draw Follow-up by: [name] DO,  April 21, 2010 8:52 PM  Additional Follow-up for Phone Call Additional follow up Details #1::        Pt at [facility] [phone]. Per Rita Miller, pt feels nauseated, lightheaded. Vitals are good, O2 is normal, no fever. Fentanyl patch was removed 10 mins. ago. Pt now resting with feet propped up. Pt feels sleepy but is responsive. Nurse concerned that symptoms were coming from Fentanyl patch. Do you want to change dose? Please advise.  They will take pt to [facility] office for labs from 9/14. Rita Miller CMA Duncan Dull)  April 22, 2010 4:49 PM     Additional Follow-up for Phone Call Additional follow up Details #2::    stop current pain patch.  no patch x 24 hrs try lower dose patch in 48 hrs if similar reaction occurs - DC pain patch Follow-up by: [name] DO,  April 22, 2010 5:38 PM  Additional Follow-up for Phone Call Additional follow up Details #3:: Details for Additional Follow-up Action Taken: Spoke to nurse this a.m. and she states that pt was taken to the hospital by EMS last night as pt began to complain of chest pain. Pt was admitted to [facility]. Should we still send Fentanyl rx to nursing home? Rita Miller CMA Duncan Dull)  April 23, 2010 9:07 AM   no, we can wait until she gets out of the hospital Additional Follow-up by: [name] DO,  April 23, 2010 5:37 PM  New/Updated Medications: FENTANYL 12 MCG/HR PT72 (FENTANYL) apply new patch q 72 hrs Prescriptions: FENTANYL 12 MCG/HR PT72 (FENTANYL) apply new patch q 72 hrs  #10 x 0  Entered and Authorized by:   [name] DO   Signed by:   [name] DO on 04/22/2010   Method used:   Printed then faxed to ...         RxID:   [id number]

## 2010-09-07 NOTE — Progress Notes (Signed)
Summary: PT check, home health request  Phone Note Outgoing Call   Summary of Call: call pt - INR too low.   see change in coumadin dose.  take coumadin 4 mg - 2 tabs daily x 2 days, then 4 mg once daily. come back for INR within 1 week Initial call taken by: [name] DO,  Dec 15, 2009 12:49 PM  Follow-up for Phone Call        Notified pt's daughter of Dr. Olegario Messier instructions.  She states that pt's surgery has been moved up to 12/25/09 and she will be stopping her Coumading on 12/19/09. Do you still want her to come in for the 1 week Coumadin check on 12/22/09?  Also, pt's daughter is requesting that we contact home health to assist pt. during the time frame after surgery while she is at home.  Please advise?  Mervin Kung CMA  Dec 15, 2009 3:38 PM   Additional Follow-up for Phone Call Additional follow up Details #1::        no,  she can continue same dose of coumadin (3 mg) until 12/19/2009 when she stops for upcoming surgery.  please clarify - is she having vascular surgery Additional Follow-up by: [name] DO,  Dec 15, 2009 4:04 PM    Additional Follow-up for Phone Call Additional follow up Details #2::    Left message on machine for daughter Alinda Money) @ [phone] to keep pt. on 3mg  dose until 12/19/09 and to let us know the type of surgery pt. is having.  Mervin Kung CMA  Dec 15, 2009 4:49 PM   Pt's daughter states that pt. is having angiogram and possibly a shunt put in place by Dr. Reuel Boom with [facility]. She says it is same day surgery and she doesn't feel pt will be ready to return to daycare on Monday. She would like home health contacted to evaluate for personal care services after the surgery.  Please advise?  Mervin Kung CMA  Dec 16, 2009 8:31 AM   Additional Follow-up for Phone Call Additional follow up Details #3:: Details for Additional Follow-up Action Taken: Home Health Referral to [facility]    , per [name]  they have appt with [facility]  12/19/2009 Additional Follow-up by: Darral Dash,  Dec 21, 2009 9:32 AM  New/Updated Medications: COUMADIN 4 MG TABS (WARFARIN SODIUM) take as directed [BMN] Prescriptions: COUMADIN 4 MG TABS (WARFARIN SODIUM) take as directed Brand medically necessary #45 x 2   Entered and Authorized by:   [name] DO   Signed by:   [name] DO on 12/15/2009   Method used:   [facility]        [facility] [phone]* (retail)       [address] [place], [place]  [address]       Ph: [phone] or [phone]       Fax: [phone]   RxID:   [id number]

## 2010-09-07 NOTE — Miscellaneous (Signed)
Summary: Face to Face Encounter/[facility]  Face to Face Encounter/[facility]   Imported By: Lanelle Bal 12/23/2009 08:51:02  _____________________________________________________________________  External Attachment:    Type:   Image     Comment:   External Document

## 2010-09-07 NOTE — Assessment & Plan Note (Signed)
Summary: FOLLOW UP DISCHARGED   REHAB. Rita Miller    daughter has to have la...   Vital Signs:  Patient profile:   77 year old female Weight:      105.25 pounds BMI:     20.63 O2 Sat:      97 % on Room air Temp:     98.3 degrees F oral Pulse rate:   79 / minute Pulse rhythm:   regular Resp:     18 per minute BP sitting:   120 / 70  (left arm) Cuff size:   regular  Vitals Entered By: [name] (January 25, 2010 4:41 PM)  O2 Flow:  Room air CC: follow-up visit   Primary Care Provider:  [name] DO  CC:  follow-up visit.  History of Present Illness: 77 y/o white female for hosp f/u pt was hospitalized since 12/23/2009, she never had vascular procedure medical therapy advised  she was treated for pneumonia  her memory dementia worse during illness she requires additional assistance daughter not able to adequately take care of pt     Allergies: 1)  ! Percocet 2)  ! Morphine Sulfate (Morphine Sulfate) 3)  Aricept  Past History:  Past Medical History: CORONARY ARTERY DISEASE (ICD-414.00) CEREBROVASCULAR ACCIDENT, HX OF (ICD-V12.50) ATRIAL FIBRILLATION (ICD-427.31)  HYPERLIPIDEMIA (ICD-272.4)  COPD (ICD-496)      EPISTAXIS, RECURRENT (ICD-784.7)   OSTEOPOROSIS (ICD-733.00)   HYPOTHYROIDISM (ICD-244.9)   PULMONARY EMBOLISM, HX OF (ICD-V12.51) DIVERTICULITIS, HX OF (ICD-V12.79) H/O peptic ulcer disease CERVICAL CANCER, HX OF (ICD-V10.41) BREAST CANCER, HX OF (ICD-V10.3) ASTHMA (ICD-493.90) ANEMIA-IRON DEFICIENCY (ICD-280.9) Blood transfusion  Hx of DVT Hx LBBB Severe MR; MV repair 7/10 Severe Aortic Stenosis Bradycardia, s/p pacemaker implant PAD - left leg  Past Surgical History: H/O thyroidectomy due to cancer Total hip replacement Hysterectomy   Maectomy      Mitral valve repair   Left Eye Implant    S/P Pacemaker    Family History: Family History Breast cancer 1st degree relative <50 Family History of Colon CA 1st degree relative  <60 Family History Diabetes 1st degree relative Family History High cholesterol Family History Hypertension   Mother with unknown heart disease              Social History: Retired Widow  1 daughter  Tobacco Use - Former.    Alcohol Use - no             Review of Systems  The patient denies fever, chest pain, and prolonged cough.    Physical Exam  General:  thin 77 y/o Head:  normocephalic and atraumatic.   Mouth:  pharynx pink and moist.   Lungs:  normal respiratory effort and normal breath sounds.   Heart:  normal rate, regular rhythm, and no gallop.   Pulses:  diminished left PD and PT pulse, some mottling of left toes Extremities:  No lower extremity edema  Neurologic:  cranial nerves II-XII intact.     Impression & Recommendations:  Problem # 1:  PNEUMONIA (ICD-486) Assessment Improved CXR to ensure complete resolution  Orders: T-2 View CXR, Same Day (71020.5TC)  Problem # 2:  ATRIAL FIBRILLATION (ICD-427.31) INR managed by [facility] during hosp.  check INR today  Her updated medication list for this problem includes:    Coumadin 4 Mg Tabs (Warfarin sodium) ..... One by mouth once daily    Cartia Xt 240 Mg Xr24h-cap (Diltiazem hcl coated beads) ..... One by mouth once daily  Aspirin 81 Mg Tbec (Aspirin) ..... One by mouth every day  Reviewed the following: PT: 16.9 (12/14/2009)   INR: 1.39 (12/14/2009) Coumadin Dose (weekly): 28 mg (12/25/2009) Prior Coumadin Dose (weekly): 28 mg (12/25/2009) Next Protime: 08/12/2009 (dated on 08/05/2009)  Problem # 3:  MEMORY LOSS (ICD-780.93)  Still having insomnia and behavioral issues (esp at night).   no prev CT of brain.  possibility of vascular dementia. continue exelon patch and current dose of namenda she did not tolerate remeron in the past daughter not able to adequately take care of pt at home.  initiate FL2 for [facility]  Orders: Misc. Referral (Misc. Ref)  Problem # 4:  UNSPECIFIED PERIPHERAL VASCULAR  DISEASE (ICD-443.9) Pt with PVD of left lower limb.  Vasc specialist at [facility] tried angio.  she was felt not be surgical candidate.  not amenable to stent placement.  continue medical treatment.   Problem # 5:  UNSPECIFIED DEBILITY (ICD-799.3)  Arrange home PT  Orders: Physical Therapy Referral (PT)  Complete Medication List: 1)  Coumadin 4 Mg Tabs (Warfarin sodium) .... One by mouth once daily 2)  Cartia Xt 240 Mg Xr24h-cap (Diltiazem hcl coated beads) .... One by mouth once daily 3)  Synthroid 100 Mcg Tabs (Levothyroxine sodium) .... Take 1 tablet by mouth every morning 4)  Quinapril Hcl 10 Mg Tabs (Quinapril hcl) .... One by mouth once daily 5)  Flovent Hfa 44 Mcg/act Aero (Fluticasone propionate  hfa) .... 2 puffs by mouth once daily 6)  Aspirin 81 Mg Tbec (Aspirin) .... One by mouth every day 7)  Namenda 5 Mg Tabs (Memantine hcl) .... One by mouth two times a day 8)  Exelon 4.6 Mg/24hr Pt24 (Rivastigmine) .... Apply new patch  daily 9)  Lipitor 20 Mg Tabs (Atorvastatin calcium) .... One by mouth once daily  Other Orders: T-Protime, Auto ([id number])  Patient Instructions: 1)  Please schedule a follow-up appointment in 1 month. 2)  Get FL2 form for our office to complete. 3)  Our office will contact you re:  homecare referral  Current Allergies (reviewed today): ! PERCOCET ! MORPHINE SULFATE (MORPHINE SULFATE) ARICEPT

## 2010-09-07 NOTE — Progress Notes (Signed)
Summary: Physical Therapy   Phone Note From Other Clinic Call back at [phone]   Caller: Janelle Floor- [facility] Summary of Call: Maxine Glenn from [facility] called and requested  a verbal order to provide physical therapy twice a week for 4 weeks for dysphagia.   She was provided a verbal order for the above treatment per Dr Artist Pais instructions Initial call taken by: [name],  August 25, 2009 8:52 AM

## 2010-09-07 NOTE — Medication Information (Signed)
Summary: Change from Quinapril to Lisinopril/[facility]  Change from Quinapril to Lisinopril/[facility]   Imported By: Lanelle Bal 03/03/2010 10:19:08  _____________________________________________________________________  External Attachment:    Type:   Image     Comment:   External Document

## 2010-09-07 NOTE — Assessment & Plan Note (Signed)
Summary: 1 week f/u - jr   Vital Signs:  Patient profile:   77 year old female Height:      60 inches Weight:      105 pounds BMI:     20.58 O2 Sat:      98 % on Room air Temp:     97.3 degrees F oral Pulse rate:   79 / minute Pulse rhythm:   regular Resp:     22 per minute BP sitting:   110 / 72  (left arm) Cuff size:   regular  Vitals Entered By: [name] (November 05, 2009 3:35 PM)  O2 Flow:  Room air CC: Rm 3- 1 week follow up    Primary Care Provider:  [name] DO  CC:  Rm 3- 1 week follow up .  History of Present Illness: 77 y/o female for follow up cough is much better.  less dyspnea no side effects from levaquin  coumadin therapy - coumadin held x 3 days due to supratherapeutic INR,  recent INR 3.7 daughter reports mom eats well but not many vegetables  chronic insomnia - still can 't sleep .  remeron not helping.  she occ takes nyquil at bedtime  Allergies: 1)  ! Percocet 2)  ! Morphine Sulfate (Morphine Sulfate) 3)  Aricept  Past History:  Past Medical History: CORONARY ARTERY DISEASE (ICD-414.00) CEREBROVASCULAR ACCIDENT, HX OF (ICD-V12.50) ATRIAL FIBRILLATION (ICD-427.31)  HYPERLIPIDEMIA (ICD-272.4)  COPD (ICD-496)      EPISTAXIS, RECURRENT (ICD-784.7)  OSTEOPOROSIS (ICD-733.00)  HYPOTHYROIDISM (ICD-244.9)   PULMONARY EMBOLISM, HX OF (ICD-V12.51) DIVERTICULITIS, HX OF (ICD-V12.79) H/O peptic ulcer disease CERVICAL CANCER, HX OF (ICD-V10.41) BREAST CANCER, HX OF (ICD-V10.3) ASTHMA (ICD-493.90) ANEMIA-IRON DEFICIENCY (ICD-280.9) Blood transfusion  Hx of DVT Hx LBBB Severe MR; MV repair 7/10 Severe Aortic Stenosis Bradycardia, s/p pacemaker implant  Past Surgical History: H/O thyroidectomy due to cancer Total hip replacement Hysterectomy  Maectomy     Mitral valve repair   Left Eye Implant    S/P Pacemaker    Family History: Family History Breast cancer 1st degree relative <50 Family History of Colon CA 1st degree  relative <60 Family History Diabetes 1st degree relative Family History High cholesterol Family History Hypertension   Mother with unknown heart disease           Social History: Retired Widow  1 daughter  Tobacco Use - Former.    Alcohol Use - no          Review of Systems  The patient denies fever, weight loss, weight gain, and prolonged cough.    Physical Exam  General:  alert and underweight appearing.   Head:  normocephalic and atraumatic.   Lungs:  normal respiratory effort, normal breath sounds, no crackles, and no wheezes.   Heart:  normal rate, regular rhythm, and no gallop.   Abdomen:  soft, non-tender, and normal bowel sounds.   Extremities:  No lower extremity edema  Neurologic:  cranial nerves II-XII intact and gait normal.   Psych:  normally interactive, good eye contact, not anxious appearing, and not depressed appearing.     Impression & Recommendations:  Problem # 1:  BREAST PAIN, LEFT (ICD-611.71) Breast u/s and mammogram negative.  Problem # 2:  BRONCHITIS, ACUTE WITH BRONCHOSPASM (ICD-466.0) Assessment: Improved less cough and dyspnea.  continue maintenance inhaler.    she sleeps with head of bed elevated   The following medications were removed from the medication list:    Avelox  400 Mg Tabs (Moxifloxacin hcl) ..... One by mouth once daily Her updated medication list for this problem includes:    Flovent Hfa 44 Mcg/act Aero (Fluticasone propionate  hfa) .Marland Kitchen... 2 puffs by mouth once daily    Combivent 103-18 Mcg/act Aero (Albuterol-ipratropium) .Marland Kitchen... 2 inh. q4-6h as needed  Problem # 3:  INSOMNIA, CHRONIC (ICD-307.42) no improvement with remeron.  I advised against using OTC agents with decongestants / benadryl.  I also advised against using sedative or ambien due to risk for fall esp with pt on coumadin.  trial of low dose OTC melatonin.  Problem # 4:  ATRIAL FIBRILLATION (ICD-427.31) Recent INR 3.7.  repeat today.   Pt advised to each cook  vegetables 2-3 x per week.  continue 2 mg of coumadin for now  Her updated medication list for this problem includes:    Coumadin 4 Mg Tabs (Warfarin sodium) .Marland Kitchen... 1/2 tablet by mouth once daily    Cardizem 120 Mg Tabs (Diltiazem hcl) .Marland Kitchen... Take 1 tablet by mouth once a day    Aspirin 81 Mg Tbec (Aspirin) ..... One by mouth every day  Reviewed the following: PT: 66.2 (10/30/2009)   INR: 7.95 (10/30/2009) Coumadin Dose (weekly): 28 mg (08/05/2009) Prior Coumadin Dose (weekly): 28 mg (08/05/2009) Next Protime: 08/12/2009 (dated on 08/05/2009)  Problem # 5:  DEPRESSION (ICD-311) I suspect depression exacerbating sleep issues.   start citalopram  The following medications were removed from the medication list:    Mirtazapine 15 Mg Tabs (Mirtazapine) ..... One  tab by mouth at bedtime Her updated medication list for this problem includes:    Citalopram Hydrobromide 20 Mg Tabs (Citalopram hydrobromide) .Marland Kitchen... 1/2 by mouth once daily x 7 days, then one by mouth once daily  Complete Medication List: 1)  Coumadin 4 Mg Tabs (Warfarin sodium) .... 1/2 tablet by mouth once daily 2)  Cardizem 120 Mg Tabs (Diltiazem hcl) .... Take 1 tablet by mouth once a day 3)  Synthroid 100 Mcg Tabs (Levothyroxine sodium) .... Take 1 tablet by mouth every morning 4)  Diovan 80 Mg Tabs (Valsartan) .... One by mouth once daily 5)  Flovent Hfa 44 Mcg/act Aero (Fluticasone propionate  hfa) .... 2 puffs by mouth once daily 6)  Aspirin 81 Mg Tbec (Aspirin) .... One by mouth every day 7)  Namenda 5 Mg Tabs (Memantine hcl) .... One by mouth two times a day 8)  Combivent 103-18 Mcg/act Aero (Albuterol-ipratropium) .... 2 inh. q4-6h as needed 9)  Exelon 4.6 Mg/24hr Pt24 (Rivastigmine) .... Apply new patch  daily 10)  Prednisone 10 Mg Tabs (Prednisone) .... 4 tabs by mouth q.d. x 3 days, 3 tabs by mouth q.d. x 3 days, 2 tabs p.o. once daily x 3 days, 1 tab p.o. x 3 days 11)  Citalopram Hydrobromide 20 Mg Tabs (Citalopram  hydrobromide) .... 1/2 by mouth once daily x 7 days, then one by mouth once daily  Other Orders: T-Protime, Auto ([id number])  Patient Instructions: 1)  Please schedule a follow-up appointment in 1 month. 2)  Schedule INR in 1 week. 3)  Split 1 mg tab of melatonin into quaters.   4)  Take 1/4 at noon, 4:00 PM, and 8 PM x 7 days Prescriptions: CITALOPRAM HYDROBROMIDE 20 MG TABS (CITALOPRAM HYDROBROMIDE) 1/2 by mouth once daily x 7 days, then one by mouth once daily  #30 x 1   Entered and Authorized by:   [name] DO   Signed by:   [name]  DO on 11/05/2009   Method used:   Electronically to        [facility] [phone]* (retail)       [address] [place], [place]  [address]       Ph: [phone] or [phone]       Fax: [phone]   RxID:   [id number]     Current Allergies (reviewed today): ! PERCOCET ! MORPHINE SULFATE (MORPHINE SULFATE) ARICEPT

## 2010-09-07 NOTE — Progress Notes (Signed)
Summary: Patient has acute question?  Phone Note Call from Patient   Caller: Daughter Summary of Call: Pt. can not breath well and has a appt. today with [facility] for the dye in her legs. What do you suggest her to do? She also is not sleeping at all. Please call patient's daughter back [phone] (Cell) Does she need to be seen toady and should she still have testing done with [facility] today? Initial call taken by: [name],  September 23, 2009 9:25 AM  Follow-up for Phone Call        I can not advise without seeing pt.    Follow-up by: [name] DO,  September 23, 2009 11:57 AM  Additional Follow-up for Phone Call Additional follow up Details #1::        pt.'s daughter states that her mother went to the [facility]  September 23, 2009 12:28 PM

## 2010-09-07 NOTE — Miscellaneous (Signed)
Summary: Copy of FL2/[facility]  Copy of FL2/[facility]   Imported By: Lanelle Bal 07/16/2010 13:05:51  _____________________________________________________________________  External Attachment:    Type:   Image     Comment:   External Document

## 2010-09-07 NOTE — Assessment & Plan Note (Signed)
Summary: 1WEEK FOLLOW UP/DK   Vital Signs:  Patient profile:   77 year old female Height:      60 inches Weight:      109.25 pounds BMI:     21.41 O2 Sat:      100 % on Room air Temp:     97.9 degrees F oral Pulse rate:   72 / minute Pulse rhythm:   regular Resp:     18 per minute BP sitting:   114 / 60  (left arm) Cuff size:   regular  Vitals Entered By: [name] (May 20, 2010 4:00 PM)  O2 Flow:  Room air CC: 1 week follow  up Is Patient Diabetic? No Pain Assessment Patient in pain? no      Comments discuss d/c breathing treatment, [name] states patient had reaction to Fentanyl patch, patient is requesting something for pain   Primary Care Provider:  [name] DO  CC:  1 week follow  up.  History of Present Illness: 77 y/o female for f/u left foot drainage better she couldn't tolerate lower dose of fentanyl patch due to confusion  Allergies: 1)  ! Percocet 2)  ! Morphine Sulfate (Morphine Sulfate) 3)  Aricept  Past History:  Past Medical History: CORONARY ARTERY DISEASE (ICD-414.00) CEREBROVASCULAR ACCIDENT, HX OF (ICD-V12.50) ATRIAL FIBRILLATION (ICD-427.31)  HYPERLIPIDEMIA (ICD-272.4)   COPD (ICD-496)        EPISTAXIS, RECURRENT (ICD-784.7)    OSTEOPOROSIS (ICD-733.00)   HYPOTHYROIDISM (ICD-244.9)   PULMONARY EMBOLISM, HX OF (ICD-V12.51) DIVERTICULITIS, HX OF (ICD-V12.79) H/O peptic ulcer disease CERVICAL CANCER, HX OF (ICD-V10.41) BREAST CANCER, HX OF (ICD-V10.3) ASTHMA (ICD-493.90) ANEMIA-IRON DEFICIENCY (ICD-280.9) Blood transfusion  Hx of DVT Hx LBBB Severe MR; MV repair 7/10 Severe Aortic Stenosis Bradycardia, s/p pacemaker implant Severe PAD - left leg with occluded Viabahn stents x 3 left SFA.  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    Alcohol Use - no                Physical Exam  General:  alert and underweight appearing.   Lungs:  normal respiratory effort and normal breath sounds.   Heart:  normal rate and regular rhythm.  SEM II/VI Extremities:  No lower extremity edema Skin:  chronic erythema of left foot / toes Psych:  normally interactive and good eye contact.     Impression & Recommendations:  Problem # 1:  CELLULITIS, FOOT (ICD-682.7) complete 10 day course of levaquin.  CT of foot neg for osteo refer to wound center to see if hyperbaric chamber can help healing  Her updated medication list for this problem includes:    Levaquin 250 Mg Tabs (Levofloxacin) ..... One by mouth once daily  Orders: Wound Care Center Referral (Wound Care)  Problem # 2:  COUMADIN THERAPY (ICD-V58.61)  Future Orders: T-Protime, Auto ([id number]) ... 05/27/2010  Problem # 3:  PVD (ICD-443.9) I doubt she will tolerate amputation if chronic left foot pain gets worse.  continue medical tx for now.  Complete Medication List: 1)  Coumadin 3 Mg Tabs (Warfarin sodium) .... One by mouth once daily 2)  Cartia Xt 240 Mg Xr24h-cap (Diltiazem hcl coated beads) .... One by mouth once  daily 3)  Synthroid 100 Mcg Tabs (Levothyroxine sodium) .... Take 1 tablet by mouth every morning 4)  Flovent Hfa 44 Mcg/act Aero (Fluticasone propionate  hfa) .... 2 puffs by mouth once daily 5)  Aspirin 81 Mg Tbec (Aspirin) .... One by mouth every day 6)  Namenda 5 Mg Tabs (Memantine hcl) .... One by mouth two times a day 7)  Exelon 4.6 Mg/24hr Pt24 (Rivastigmine) .... Apply new patch  daily 8)  Lidoderm 5 % Ptch (Lidocaine) .... Apply patch to right chest wall 12 hrs per day as needed for pain 9)  Albuterol Sulfate (2.5 Mg/46ml) 0.083% Nebu (Albuterol sulfate) .... Qid as needed for weheezing or sob 10)  Melatonin 3 Mg Tabs (Melatonin) .... Take 1 tab by mouth at bedtime as needed 11)  Pedi-dri 100000 Unit/gm Powd  (Nystatin) .... Apply three times a day underneath left breast 12)  Losartan Potassium 25 Mg Tabs (Losartan potassium) .... One by mouth once daily 13)  Crestor 20 Mg Tabs (Rosuvastatin calcium) .... One by mouth qd 14)  Triamcinolone Acetonide 0.1 % Crea (Triamcinolone acetonide) .... Apply two times a day to left side of neck x 1 week 15)  Levaquin 250 Mg Tabs (Levofloxacin) .... One by mouth once daily 16)  Hydrocodone-acetaminophen 2.5-500 Mg Tabs (Hydrocodone-acetaminophen) .... 1/2 tab by mouth two times a day as needed  Other Orders: Future Orders: T-Basic Metabolic Panel [id number]) ... 05/27/2010 T-TSH [id number]) ... 05/27/2010 Prescriptions: COUMADIN 3 MG TABS (WARFARIN SODIUM) one by mouth once daily  #30 x 0   Entered and Authorized by:   [name] DO   Signed by:   [name] DO on 05/20/2010   Method used:   Print then Give to Patient   RxID:   [id number] HYDROCODONE-ACETAMINOPHEN 2.5-500 MG TABS (HYDROCODONE-ACETAMINOPHEN) 1/2 tab by mouth two times a day as needed  #30 x 1   Entered and Authorized by:   [name] DO   Signed by:   [name] DO on 05/20/2010   Method used:   Print then Give to Patient   RxID:   [id number]    Orders Added: 1)  T-Basic Metabolic Panel [[id number]] 2)  T-Protime, Auto [[id number]] 3)  T-TSH [[id number]] 4)  Wound Care Center Referral [Wound Care] 5)  Est. Patient Level III [75643]   Current Allergies (reviewed today): ! PERCOCET ! MORPHINE SULFATE (MORPHINE SULFATE) ARICEPT

## 2010-09-07 NOTE — Miscellaneous (Signed)
Summary: Copy of DNR Order  Copy of DNR Order   Imported By: Lanelle Bal 07/16/2010 13:02:38  _____________________________________________________________________  External Attachment:    Type:   Image     Comment:   External Document

## 2010-09-07 NOTE — Progress Notes (Signed)
Summary: Blood Work  Programme researcher, broadcasting/film/video of Call: call pt - INR is in therapeutic range 2.54.  keep taking 4 mg of coumadin daily.  return in 1 week for repeat INR Initial call taken by: [name] DO,  August 13, 2009 5:11 PM  Follow-up for Phone Call        attempted to contact patient at [phone] no answer,detailed voice message left informing patient to return in one week for INR blood work. Follow-up by: [name],  August 13, 2009 5:51 PM

## 2010-09-07 NOTE — Progress Notes (Signed)
Summary: Lab Results  Phone Note Outgoing Call   Summary of Call: call pt - INR 2.24.  continue same dose of coumadin.  Repeat INR in 1 week please confirm current dosing of coumadin Initial call taken by: [name] DO,  January 26, 2010 4:58 PM  Follow-up for Phone Call        patient advised per Dr Artist Pais instructions. [name] states patient is taking 5 mg of Coumadin daily Follow-up by: [name],  January 26, 2010 5:17 PM

## 2010-09-07 NOTE — Progress Notes (Signed)
  Phone Note Call from Patient   Caller: Daughter Call For: Rasheida Broden Details for Reason: Needs call back Summary of Call: Please call patient's daughter  [name]   [phone]  she has lots of questions about mother condition .    Kimiah going to [facility]  day care and  they will  not draw her blood , and [facility] will not come to home because she is not considered  home bound because [facility]  . [name] want to know if there is some place she can get her blood drawn after 5:30pm  What did the doctor at the hospital in [place] mean when he said her kidneys are failing.   Initial call taken by: Darral Dash,  October 28, 2009 12:28 PM  Follow-up for Phone Call        I do not know of lab that is open after 5:30.  I will ask Darlene to see if [facility] that will draw "after hours" Follow-up by: [name] DO,  October 29, 2009 4:59 PM

## 2010-09-07 NOTE — Miscellaneous (Signed)
Summary: Physician Sherren Kerns  Physician Orders/[facility]   Imported By: Lanelle Bal 07/16/2010 12:54:34  _____________________________________________________________________  External Attachment:    Type:   Image     Comment:   External Document

## 2010-09-07 NOTE — Progress Notes (Signed)
Summary: please fax pneumonia vaccine information   Phone Note Call from Patient   Caller: patient daughter Rita Miller Call For: Rita Miller  Summary of Call: needs a copy of pneumonia vaccine paperwork faxed to [facility].   Initial call taken by: Roselle Locus,  October 13, 2009 4:38 PM  Follow-up for Phone Call        called returned to patients dauhgter Rita Miller, she was informed we did not have any documentation stating patient recieved a pneumonia vaccine, therefore information requested could not be sent to [facility] as requested. She states patient will follow up on her next office visit.  call was returned to [facility] after speaking with Dr Artist Pais she was advised to check with [facility] and [facility] to see if patient was given Pneumonia vaccine prior to getting another vaccination on return visit. She verbalized understanding and agrees to do so Follow-up by: [name],  October 13, 2009 5:59 PM

## 2010-09-07 NOTE — Miscellaneous (Signed)
Summary: Orders Update  Clinical Lists Changes  Orders: Added new Test order of TLB-PT (Protime) (85610-PTP) - Signed 

## 2010-09-07 NOTE — Letter (Signed)
Summary: CMN for Wheelchair/[facility]  CMN for Wheelchair/[facility]   Imported By: Lanelle Bal 03/05/2010 13:36:00  _____________________________________________________________________  External Attachment:    Type:   Image     Comment:   External Document

## 2010-09-07 NOTE — Progress Notes (Signed)
Summary: Pt in hospital  Phone Note Call from Patient   Caller: Daughter Details for Reason: FYI   pt in hospital Summary of Call: Daughter [name]) called  mother in hospital  [place], with pneumonia and needs tranfusion Initial call taken by: Darral Dash,  June 29, 2010 4:34 PM  Follow-up for Phone Call        noted Follow-up by: [name] DO,  June 29, 2010 5:23 PM

## 2010-09-07 NOTE — Progress Notes (Signed)
  FAxed [facility] several times, [place] with Answering service several times, Left Messages x3 for Rita Miller ( MR) been faxin g [facility] since 06/25/09, Spoke with Rita Miller this am @ 10:14 asked her to fax over any REcords she had on pt. I recived these records today @ 10:30 am forwarded to [facility] in scheduling.

## 2010-09-07 NOTE — Progress Notes (Signed)
Summary: [name]  [facility]   Imported By: Lanelle Bal 07/16/2010 12:59:28  _____________________________________________________________________  External Attachment:    Type:   Image     Comment:   External Document

## 2010-09-07 NOTE — Cardiovascular Report (Signed)
Summary: Office Visit   Office Visit   Imported By: Roderic Ovens 04/26/2010 16:17:38  _____________________________________________________________________  External Attachment:    Type:   Image     Comment:   External Document

## 2010-09-07 NOTE — Miscellaneous (Signed)
Summary: FL2  FL2   Imported By: Lanelle Bal 02/09/2010 10:44:09  _____________________________________________________________________  External Attachment:    Type:   Image     Comment:   External Document

## 2010-09-07 NOTE — Miscellaneous (Signed)
Summary: PT/INR Order/[facility]  PT/INR Order/[facility]   Imported By: Lanelle Bal 09/10/2009 11:18:21  _____________________________________________________________________  External Attachment:    Type:   Image     Comment:   External Document

## 2010-09-07 NOTE — Progress Notes (Signed)
Summary: daughter wants Dr Artist Pais to be aware   Phone Note Call from Patient Call back at [phone]   Caller: patient daughter  Call For: yoo  Summary of Call: Jakelyn says she slept in another patients bed that had an infection. The other patient was hot and Kynzee was cold so the staff had them change beds.  [name] wants Dr Artist Pais to be aware of this.   Initial call taken by: Roselle Locus,  April 26, 2010 11:31 AM

## 2010-09-07 NOTE — Progress Notes (Signed)
Summary: her toe is green   Phone Note Call from Patient Call back at [phone]   Caller: patient daughter [name] Call For: Artist Pais  Summary of Call: patients toe is green.   Initial call taken by: Roselle Locus,  July 13, 2010 9:40 AM  Follow-up for Phone Call        needs OV Follow-up by: [name] DO,  July 13, 2010 9:49 AM  Additional Follow-up for Phone Call Additional follow up Details #1::        call returned to patients daughter [name]  [phone], she was informed per Dr Artist Pais instructions. She states she will have the nursing home call to schedule appointment for patient  Additional Follow-up by: [name],  July 13, 2010 10:18 AM

## 2010-09-07 NOTE — Progress Notes (Signed)
Summary: Dexa & test results  Phone Note Outgoing Call   Summary of Call: call pt - CT of chest shows subacute rib fracture right sixth and seventh rib.  please make sure she is taking calcium and vit D supplement.  500 mg two times a day with meals.  I suggest DEXA scan at [facility] to assess for osteoporosis Initial call taken by: [name] DO,  August 12, 2009 12:04 PM  Follow-up for Phone Call        patients daughter Camelia Eng provided information per Dr Artist Pais instructions. Dexa has been scheduled for 08/17/2009 @ 11:30am . Preparation instructions provided. Camelia Eng was informed the test would be done at the [facility] in [place] Follow-up by: [name],  August 13, 2009 12:41 PM

## 2010-09-07 NOTE — Miscellaneous (Signed)
Summary: Care Plan/[facility]  Care Plan/[facility]   Imported By: Lanelle Bal 11/12/2009 09:44:09  _____________________________________________________________________  External Attachment:    Type:   Image     Comment:   External Document

## 2010-09-07 NOTE — Progress Notes (Signed)
Summary: start 2 mg coumadin pt in 3 days   Phone Note From Other Clinic   Caller: [facility] Details for Reason: INR Summary of Call:  Nurse called with INR    PT   37.1    INR  3.7    pt has been off coumdin  since Friday  March 25     Initial call taken by: Darral Dash,  November 02, 2009 2:44 PM  Follow-up for Phone Call        please confirm pt has been taking coumadin 4 mg daily before pt told to hold.  if yes, have pt restart 4 mg 1/2 tab or 2mg  daily.  repeat INR in 3 days Follow-up by: [name] DO,  November 02, 2009 3:09 PM  Additional Follow-up for Phone Call Additional follow up Details #1::        spoke with daughter patient was taking 4 mg coumadin till told to hold.  will start on 2 mg today and have pt inr done in 3 days.  Roselle Locus  November 03, 2009 8:17 AM

## 2010-09-07 NOTE — Assessment & Plan Note (Signed)
Summary: ? Bladder Fell- jr   Vital Signs:  Patient profile:   77 year old female Weight:      105.50 pounds BMI:     20.68 O2 Sat:      99 % on Room air Temp:     97.6 degrees F oral Pulse rate:   69 / minute Pulse rhythm:   irregular Resp:     18 per minute BP sitting:   130 / 60  (left arm) Cuff size:   regular  Vitals Entered By: [name] (September 03, 2009 1:52 PM)  O2 Flow:  Room air  Primary Care Provider:  [name] DO  CC:  vaginal symptoms.  History of Present Illness: patient states when she goes to the bathroom  she fee;s a ball in her pelvic area remote hx of hysterectomy   If gyn referral is needed, they request Kallie Locks [phone]  Allergies: 1)  ! Percocet 2)  ! Morphine Sulfate (Morphine Sulfate)  Past History:  Past Medical History: Current Problems:  CORONARY ARTERY DISEASE (ICD-414.00) CEREBROVASCULAR ACCIDENT, HX OF (ICD-V12.50) ATRIAL FIBRILLATION (ICD-427.31)  HYPERLIPIDEMIA (ICD-272.4) COPD (ICD-496)   EPISTAXIS, RECURRENT (ICD-784.7)  OSTEOPOROSIS (ICD-733.00)  HYPOTHYROIDISM (ICD-244.9) PULMONARY EMBOLISM, HX OF (ICD-V12.51) DIVERTICULITIS, HX OF (ICD-V12.79) H/O peptic ulcer disease CERVICAL CANCER, HX OF (ICD-V10.41) BREAST CANCER, HX OF (ICD-V10.3) ASTHMA (ICD-493.90) ANEMIA-IRON DEFICIENCY (ICD-280.9) Blood transfusion  Hx of DVT Hx LBBB Severe MR; MV repair 7/10 Severe Aortic Stenosis  Past Surgical History: H/O thyroidectomy due to cancer Total hip replacement Hysterectomy  Maectomy  Mitral valve repair  Left Eye Implant    S/P Pacemaker   Family History: Family History Breast cancer 1st degree relative <50 Family History of Colon CA 1st degree relative <60 Family History Diabetes 1st degree relative Family History High cholesterol Family History Hypertension   Mother with unknown heart disease       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     Alcohol Use - no      Physical Exam  General:  alert, well-developed, and well-nourished.   Lungs:  normal respiratory effort and normal breath sounds.   Heart:  normal rate, regular rhythm, and no gallop.   Abdomen:  soft, non-tender, and normal bowel sounds.   Genitalia:  vaginal wall prolapse,  vaginal atrophy.     Impression & Recommendations:  Problem # 1:  PROLAPSE OF VAGINAL VAULT AFTER HYSTERECTOMY (ICD-618.5)  refer to local GYN for further eval and treatment.  See if pessary would help.  Pt has multiple co morbidities which may preclude any surgery.  Orders: Gynecologic Referral (Gyn)  Complete Medication List: 1)  Coumadin 4 Mg Tabs (Warfarin sodium) .... One by mouth once daily 2)  Cardizem 120 Mg Tabs (Diltiazem hcl) .... Take 1 tablet by mouth once a day 3)  Synthroid 100 Mcg Tabs (Levothyroxine sodium) .... Take 1 tablet by mouth every morning 4)  Lipitor 20 Mg Tabs (Atorvastatin calcium) .... Take 1 tab by mouth at bedtime 5)  Quinapril Hcl 10 Mg Tabs (Quinapril hcl) .... Take 1 tablet by mouth once a day 6)  Flovent Hfa 44 Mcg/act Aero (Fluticasone propionate  hfa) .... 2 puffs by mouth once daily 7)  Aspirin 81 Mg Tbec (Aspirin) .... One by mouth every day 8)  Coumadin 2 Mg Tabs (Warfarin sodium) .Marland Kitchen.. 1 tab on saturday, sunday, tuesday, thursday 9)  Mirtazapine 15 Mg Tabs (Mirtazapine) .... One  tab  by mouth at bedtime 10)  Namenda 5 Mg Tabs (Memantine hcl) .... One by mouth two times a day  Patient Instructions: 1)  Keep your next follow up appointment.    Current Allergies (reviewed today): ! PERCOCET ! MORPHINE SULFATE (MORPHINE SULFATE)

## 2010-09-07 NOTE — Assessment & Plan Note (Signed)
Summary: 2 week follow up/mhf   Vital Signs:  Patient profile:   77 year old female Weight:      107.50 pounds BMI:     21.07 O2 Sat:      96 % on Room air Temp:     98.2 degrees F oral Pulse rate:   74 / minute Pulse rhythm:   irregular Resp:     20 per minute BP sitting:   120 / 68  (left arm) Cuff size:   regular  Vitals Entered By: [name] (May 05, 2010 8:02 AM)  O2 Flow:  Room air CC: 2 Week Follow up Is Patient Diabetic? No Pain Assessment Patient in pain? no      Comments discuss toe   Primary Care Provider:  [name] DO  CC:  2 Week Follow up.  History of Present Illness: 77 y/o white female with pmhx of aortic stenosis, PVD, dementia and copd for f/u pt c/o ongoing left foot pain scant drainage from left great toe  several hospitalization for right sided chest pains persumed secondary to pacemaker  Preventive Screening-Counseling & Management  Alcohol-Tobacco     Smoking Status: quit  Allergies: 1)  ! Percocet 2)  ! Morphine Sulfate (Morphine Sulfate) 3)  Aricept  Past History:  Past Medical History: CORONARY ARTERY DISEASE (ICD-414.00) CEREBROVASCULAR ACCIDENT, HX OF (ICD-V12.50) ATRIAL FIBRILLATION (ICD-427.31)  HYPERLIPIDEMIA (ICD-272.4)   COPD (ICD-496)       EPISTAXIS, RECURRENT (ICD-784.7)    OSTEOPOROSIS (ICD-733.00)   HYPOTHYROIDISM (ICD-244.9)   PULMONARY EMBOLISM, HX OF (ICD-V12.51) DIVERTICULITIS, HX OF (ICD-V12.79) H/O peptic ulcer disease CERVICAL CANCER, HX OF (ICD-V10.41) BREAST CANCER, HX OF (ICD-V10.3) ASTHMA (ICD-493.90) ANEMIA-IRON DEFICIENCY (ICD-280.9) Blood transfusion  Hx of DVT Hx LBBB Severe MR; MV repair 7/10 Severe Aortic Stenosis Bradycardia, s/p pacemaker implant Severe PAD - left leg  Past Surgical History: H/O thyroidectomy due to cancer Total hip replacement Hysterectomy    Mastectomy      Mitral valve repair    Left Eye Implant    S/P Pacemaker    Family  History: Family History Breast cancer 1st degree relative <50 Family History of Colon CA 1st degree relative <60 Family History Diabetes 1st degree relative Family History High cholesterol Family History Hypertension   Mother with unknown heart disease                 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    Alcohol Use - no               Physical Exam  General:  alert and underweight appearing.   Mouth:  upper dental plate. dental caries lower incisors Lungs:  normal respiratory effort and normal breath sounds.   Heart:  normal rate and regular rhythm.  SEM II/VI Extremities:  No lower extremity edema Neurologic:  cranial nerves II-XII intact and gait normal.   Skin:  chronic erythema of left foot. left toes with bluish discoloration scant serous discharge from left great toe Psych:  normally interactive, good eye contact, not anxious appearing, and not depressed appearing.     Impression & Recommendations:  Problem # 1:  UNSPECIFIED PERIPHERAL VASCULAR DISEASE (ICD-443.9) pt with poorly healing left great toe wound start doxy unsure if pt has adequate perfusion to heal pt with multiple comorbidities not candidate for revascularization  If toe wound worsens, we may need to make tough decision re:  hospice vs left leg  amputation obtain vascular consult  Problem # 2:  ATRIAL FIBRILLATION (ICD-427.31) Assessment: Unchanged  Her updated medication list for this problem includes:    Coumadin 5 Mg Tabs (Warfarin sodium) .Marland Kitchen... 3.5 mg by mouth once daily    Cartia Xt 240 Mg Xr24h-cap (Diltiazem hcl coated beads) ..... One by mouth once daily    Aspirin 81 Mg Tbec (Aspirin) ..... One by mouth every day  Complete Medication List: 1)  Coumadin 5 Mg Tabs (Warfarin sodium) .... 3.5 mg by mouth once daily 2)  Cartia Xt 240 Mg Xr24h-cap (Diltiazem hcl coated beads) .... One by mouth once daily 3)  Synthroid 100 Mcg Tabs (Levothyroxine sodium) ....  Take 1 tablet by mouth every morning 4)  Flovent Hfa 44 Mcg/act Aero (Fluticasone propionate  hfa) .... 2 puffs by mouth once daily 5)  Aspirin 81 Mg Tbec (Aspirin) .... One by mouth every day 6)  Namenda 5 Mg Tabs (Memantine hcl) .... One by mouth two times a day 7)  Exelon 4.6 Mg/24hr Pt24 (Rivastigmine) .... Apply new patch  daily 8)  Lidoderm 5 % Ptch (Lidocaine) .... Apply patch to right chest wall 12 hrs per day as needed for pain 9)  Tylenol 8 Hour 650 Mg Cr-tabs (Acetaminophen) .... One tabl by mouth every 8 hours as needed pain 10)  Albuterol Sulfate (2.5 Mg/80ml) 0.083% Nebu (Albuterol sulfate) .... Qid as needed for weheezing or sob 11)  Melatonin 3 Mg Tabs (Melatonin) .... Take 1 tab by mouth at bedtime as needed 12)  Pedi-dri 100000 Unit/gm Powd (Nystatin) .... Apply three times a day underneath left breast 13)  Fentanyl 12 Mcg/hr Pt72 (Fentanyl) .... Apply new patch q 72 hrs 14)  Losartan Potassium 25 Mg Tabs (Losartan potassium) .... One by mouth once daily 15)  Crestor 20 Mg Tabs (Rosuvastatin calcium) .... One by mouth qd 16)  Doxycycline Hyclate 50 Mg Caps (Doxycycline hyclate) .... One by mouth two times a day 17)  Triamcinolone Acetonide 0.1 % Crea (Triamcinolone acetonide) .... Apply two times a day to left side of neck x 1 week Prescriptions: TRIAMCINOLONE ACETONIDE 0.1 % CREA (TRIAMCINOLONE ACETONIDE) apply two times a day to left side of neck x 1 week  #15 grams x 0   Entered and Authorized by:   [name] DO   Signed by:   [name] DO on 05/05/2010   Method used:   Print then Give to Patient   RxID:   [id number] DOXYCYCLINE HYCLATE 50 MG CAPS (DOXYCYCLINE HYCLATE) one by mouth two times a day  #20 x 0   Entered and Authorized by:   [name] DO   Signed by:   [name] DO on 05/05/2010   Method used:   Print then Give to Patient   RxID:   [id number] CRESTOR 20 MG TABS (ROSUVASTATIN CALCIUM) one by mouth qd  #30 x 3   Entered and  Authorized by:   [name] DO   Signed by:   [name] DO on 05/05/2010   Method used:   Print then Give to Patient   RxID:   [id number] LOSARTAN POTASSIUM 25 MG TABS (LOSARTAN POTASSIUM) one by mouth once daily  #30 x 3   Entered and Authorized by:   [name] DO   Signed by:   [name] DO on 05/05/2010   Method used:   Print then Give to Patient   RxID:   [id number]  Current Allergies (reviewed today): ! PERCOCET ! MORPHINE SULFATE (MORPHINE SULFATE) ARICEPT

## 2010-09-07 NOTE — Progress Notes (Signed)
Summary: Lab Results  Phone Note Outgoing Call   Summary of Call: call pt - INR is therapeutic at 2.55.  keep taking current dose of coumadin.  Repeat INR in 1 month (please schedule) Initial call taken by: [name] DO,  November 10, 2009 1:18 PM  Follow-up for Phone Call        call placed to patients daughter Liane Comber @ [phone] she was advised per Dr Artist Pais instructions Lab entered for Dec 10, 2009 Follow-up by: [name],  November 11, 2009 1:58 PM

## 2010-09-07 NOTE — Progress Notes (Signed)
Summary: INR order  Phone Note Outgoing Call   Summary of Call: call pt - INR 1.66.  she needs higher dose of coumadin.  stop current dose.  take 3 mg tab daily.  pt needs to come back in 1 week for repeat INR  (please arrange) Initial call taken by: [name] DO,  Dec 07, 2009 8:57 AM  Follow-up for Phone Call        left message on machine to call back to office. Follow-up by: Lucious Groves,  Dec 07, 2009 12:28 PM    New/Updated Medications: COUMADIN 3 MG TABS (WARFARIN SODIUM) one by mouth qd Prescriptions: COUMADIN 3 MG TABS (WARFARIN SODIUM) one by mouth qd  #30 x 0   Entered and Authorized by:   [name] DO   Signed by:   [name] DO on 12/07/2009   Method used:   Electronically to        [id number] (retail)       [address] [place], [place]  [address]       Ph: [phone] or [phone]       Fax: [phone]   RxID:   [id number]

## 2010-09-07 NOTE — Progress Notes (Signed)
Summary: Medication Change and Lab  Phone Note Outgoing Call   Summary of Call: call pt -  recent blood work shows pt needs lower dose of thyroid medication.  also INR slightly on the high side.  this is likely due to levaquin use.  I  suggest pt hold coumadin x 2 days, then resume prev dose. repeat INR in 1 week Initial call taken by: [name] DO,  May 28, 2010 1:28 PM  Follow-up for Phone Call        order faxed to [facility] instructing per Dr Artist Pais note. Follow-up by: [name],  May 31, 2010 8:36 AM    New/Updated Medications: SYNTHROID 88 MCG TABS (LEVOTHYROXINE SODIUM) one by mouth once daily Prescriptions: SYNTHROID 88 MCG TABS (LEVOTHYROXINE SODIUM) one by mouth once daily  #90 x 1   Entered and Authorized by:   [name] DO   Signed by:   [name] DO on 05/31/2010   Method used:   Print then Give to Patient   RxID:   [id number]

## 2010-09-07 NOTE — Progress Notes (Signed)
Summary: Status Update on Patient  Phone Note Call from Patient Call back at [phone]   Caller: Daughter Call For: [name] DO Summary of Call: SW pt's daughter Christell Constant. Said she has been up the past three nights saying that Alinda Money is trying to kill her, she also had a very bad nosebleed last night. Wants to know if this could be a reaction to her meds. Initial call taken by: Lannette Donath,  Dec 09, 2009 9:38 AM  Follow-up for Phone Call        Walk in from [place] with [facility] at the request of patients daughter, she is requesting an order to evaluate and admit to hospice if appropriate. Meriam Sprague states patient daughter informed her that patient is having an increasing difficult time with everything. If approved she request the form faxed signed and faxed to [phone] Follow-up by: [name],  Dec 09, 2009 11:51 AM  Additional Follow-up for Phone Call Additional follow up Details #1::        see order for hospice eval stop pravastatin Additional Follow-up by: [name] DO,  Dec 09, 2009 12:14 PM    Additional Follow-up for Phone Call Additional follow up Details #2::    patients daughter Dossie Arbour has been advised per Dr Artist Pais instructions Follow-up by: [name],  Dec 09, 2009 1:27 PM

## 2010-09-07 NOTE — Progress Notes (Signed)
Summary: PT & Chest Xray  Phone Note Call from Patient   Caller: Daughter Details for Reason: Appt At  Cardiology today Summary of Call:  Pt has appt today at 1:30pm  for pacemaker check,  her daughter wants her to have her coumadin check and also have chest x-ray while she is at the [facility]   office   Please call [name]   [phone]     Initial call taken by: Darral Dash,  April 20, 2010 9:25 AM  Follow-up for Phone Call        call returned to [facility] at, daughter states that she had her PT checked 2 Sundays ago,  her current dose is 3.5 mg daily. She is requesting the Chest Xray due to pain in her chest. [name] states patient believes her chest wall is inflammed from the pacemaker Follow-up by: [name],  April 20, 2010 9:35 AM  Additional Follow-up for Phone Call Additional follow up Details #1::        plz send order for PT, INR and CXR PA/LAT re:  chest wall pain  see printed orders Additional Follow-up by: [name] DO,  April 20, 2010 12:06 PM    Additional Follow-up for Phone Call Additional follow up Details #2::    Pt's daughter called to check on status of request. Advised her Dr. Artist Pais has placed order as stated above. Order faxed to [phone]. Nicki Guadalajara Fergerson CMA (AAMA)  April 20, 2010 1:25 PM

## 2010-09-07 NOTE — Assessment & Plan Note (Signed)
Summary: 1 month follow up/mhf   Vital Signs:  Patient profile:   77 year old female Weight:      101.75 pounds BMI:     19.94 O2 Sat:      99 % on Room air Temp:     97.7 degrees F oral Pulse rate:   74 / minute Pulse rhythm:   irregular Resp:     18 per minute BP sitting:   110 / 60  (left arm) Cuff size:   regular  Vitals Entered By: [name] (August 31, 2009 9:52 AM)  O2 Flow:  Room air  Primary Care Provider:  [name] DO  CC:  1 Month Follow up .  History of Present Illness: 1 Month Follow up  Right sided chest pain is better.  daughter is concerned about worsening memory loss.   aricept stopped due to symptoms of heart fluttering.  chronic insomnia  Allergies: 1)  ! Percocet 2)  ! Morphine Sulfate (Morphine Sulfate) 3)  Aricept  Past History:  Past Medical History: Current Problems:  CORONARY ARTERY DISEASE (ICD-414.00) CEREBROVASCULAR ACCIDENT, HX OF (ICD-V12.50) ATRIAL FIBRILLATION (ICD-427.31)  HYPERLIPIDEMIA (ICD-272.4) COPD (ICD-496)   EPISTAXIS, RECURRENT (ICD-784.7)  OSTEOPOROSIS (ICD-733.00)  HYPOTHYROIDISM (ICD-244.9) PULMONARY EMBOLISM, HX OF (ICD-V12.51) DIVERTICULITIS, HX OF (ICD-V12.79) H/O peptic ulcer disease CERVICAL CANCER, HX OF (ICD-V10.41) BREAST CANCER, HX OF (ICD-V10.3) ASTHMA (ICD-493.90) ANEMIA-IRON DEFICIENCY (ICD-280.9) Blood transfusion  Hx of DVT Hx LBBB Severe MR; MV repair 7/10 Severe Aortic Stenosis  Past Surgical History: H/O thyroidectomy due to cancer Total hip replacement Hysterectomy  Maectomy  Mitral valve repair  Left Eye Implant    S/P Pacemaker   Family History: Family History Breast cancer 1st degree relative <50 Family History of Colon CA 1st degree relative <60 Family History Diabetes 1st degree relative Family History High cholesterol Family History Hypertension  Mother with unknown heart disease        Social History: Retired Widow  1 daughter 8  Tobacco Use -  Former.  Alcohol Use - no       Physical Exam  General:  thin 77 y/o female, pleasant Lungs:  normal respiratory effort and normal breath sounds.   Heart:  normal rate, regular rhythm, and no gallop.  SEM 3/6 RSB Abdomen:  soft, non-tender, and no masses.   Neurologic:  unable to recall 3 items in MMSE.   did not know day of week, year poor concentration easily distracted   Impression & Recommendations:  Problem # 1:  MEMORY LOSS (ICD-780.93) Pt with poor performance on MMSE.  poor clock drawing.   she stopped aricept due to heart fluttering.  start namenda.  consider add axona.   B12 and TSH - normal  Problem # 2:  INSOMNIA, CHRONIC (ICD-307.42) increase remeron to full 15 mg tab at bedtime.  Complete Medication List: 1)  Coumadin 4 Mg Tabs (Warfarin sodium) .... One by mouth once daily 2)  Cardizem 120 Mg Tabs (Diltiazem hcl) .... Take 1 tablet by mouth once a day 3)  Synthroid 100 Mcg Tabs (Levothyroxine sodium) .... Take 1 tablet by mouth every morning 4)  Lipitor 20 Mg Tabs (Atorvastatin calcium) .... Take 1 tab by mouth at bedtime 5)  Quinapril Hcl 10 Mg Tabs (Quinapril hcl) .... Take 1 tablet by mouth once a day 6)  Flovent Hfa 44 Mcg/act Aero (Fluticasone propionate  hfa) .... 2 puffs by mouth once daily 7)  Aspirin 81 Mg Tbec (Aspirin) .... One by mouth  every day 8)  Coumadin 2 Mg Tabs (Warfarin sodium) .Marland Kitchen.. 1 tab on saturday, sunday, tuesday, thursday 9)  Mirtazapine 15 Mg Tabs (Mirtazapine) .... One  tab by mouth at bedtime 10)  Namenda 5 Mg Tabs (Memantine hcl) .... One by mouth two times a day  Patient Instructions: 1)  Please schedule a follow-up appointment in 1 month. Prescriptions: FLOVENT HFA 44 MCG/ACT AERO (FLUTICASONE PROPIONATE  HFA) 2 puffs by mouth once daily  #1 x 3   Entered and Authorized by:   D. Robert Yoo DO   Signed by:   D. Robert Yoo DO on 08/31/2009   Method used:   [id number]       [address]       [place], [place]  [address]       Ph: [phone] or [phone]       Fax: [phone]   RxID:   [id number] NAMENDA 5 MG TABS (MEMANTINE HCL) one by mouth two times a day  #60 x 2   Entered and Authorized by:   D. Robert Yoo DO   Signed by:   D. Robert Yoo DO on 08/31/2009   Method used:   [facility]        [facility] [address] [id number]       [address]       [place], [place]  [address]       Ph: [phone] or [phone]       Fax: [phone]   RxID:   [id number] MIRTAZAPINE 15 MG TABS (MIRTAZAPINE) one  tab by mouth at bedtime  #30 x 1   Entered and Authorized by:   D. Robert Yoo DO   Signed by:   D. Robert Yoo DO on 08/31/2009   Method used:   [facility]        [facility] [address] [id number]       [address] [place]  [address]       Ph: [phone] or [phone]       Fax: [phone]   RxID:   [id number]   Current Allergies (reviewed today): ! PERCOCET ! MORPHINE SULFATE (MORPHINE SULFATE) ARICEPT

## 2010-09-07 NOTE — Miscellaneous (Signed)
Summary: MSW Eval Order/[facility]  MSW Eval Order/[facility]   Imported By: Lanelle Bal 08/19/2009 10:07:21  _____________________________________________________________________  External Attachment:    Type:   Image     Comment:   External Document

## 2010-09-07 NOTE — Progress Notes (Signed)
Summary: Record request  Phone Note From Other Clinic   Caller: Admissions Summary of Call: Toniann Fail from [facility] needs most recent records for history, CPX, and labs, pls fax to [phone] Initial call taken by: Lannette Donath,  November 13, 2009 12:57 PM  Follow-up for Phone Call        information faxed to Lynelle Doctor at [phone], voice message was left @ [phone] to call if there is additiional information that is needed Follow-up by: [name],  November 16, 2009 4:44 PM

## 2010-09-07 NOTE — Medication Information (Signed)
Summary: Engineer, manufacturing systems Order/[facility]   Imported By: Lanelle Bal 12/23/2009 08:54:12  _____________________________________________________________________  External Attachment:    Type:   Image     Comment:   External Document

## 2010-09-07 NOTE — Letter (Signed)
Summary: Medical Exam Form/[facility]  Medical Exam Form/[facility]   Imported By: Lanelle Bal 11/19/2009 12:56:01  _____________________________________________________________________  External Attachment:    Type:   Image     Comment:   External Document

## 2010-09-07 NOTE — Progress Notes (Signed)
Summary: Coumdain Status  Phone Note From Other Clinic Call back at [phone]   Caller: Darnelle Maffucci- [facility] Call For: Dr Artist Pais Summary of Call: [name] called and left voice message requesting clarification on patients coumadin and next INR Draw. Patient last INR was 10/20 and she is currently taking 2mg  . [name] states patient was informed by Dr Sanjuana Kava to discontinue with the Coumadin and she is wanted to verify that the instructions to the patient . Initial call taken by: [name],  June 15, 2010 3:05 PM  Follow-up for Phone Call        Call was rerturned to [name] she was advised to contact patients cardiologist to very medication changes.  She was informed changes may have been made  pending a procedure, per Dr Vonzell Schlatter advised to contact Cardiology. She has verbalized understanding and agrees to do so. Follow-up by: [name],  June 15, 2010 3:15 PM

## 2010-09-07 NOTE — Miscellaneous (Signed)
Summary: HHA Orders/[facility]  HHA Orders/[facility]   Imported By: Lanelle Bal 09/28/2009 08:56:51  _____________________________________________________________________  External Attachment:    Type:   Image     Comment:   External Document

## 2010-09-07 NOTE — Progress Notes (Signed)
Summary: Hospital stay- patient status  Phone Note Call from Patient   Caller: Daughter Summary of Call: Rita Miller called in to advise that the hospital is telling her that the [facility] form is telling her that her mother's  kidneys are failing, she also mentioned that her mother's eyes are very swollen especially in the morning, the hospital advised her coumidin is low & they adminstered hydrocodine last night. Rita Miller would like to know what exactly is happening & thought if Dr Artist Pais ordered a CBC she might receive answers from that, Rita Miller is at work today, you can reach her on her cell phone, Rita Miller is really just seeking a prognosis Initial call taken by: Lannette Donath,  February 10, 2010 11:53 AM  Follow-up for Phone Call        call returned to patients daughter Rita Miller [phone]. FL2  stated that patient kidney is failing. Patients daughter was informed that The FL2 form completed by Dr Artist Pais does not state kidney failure. She stated that it may have been on the discharge summary from [facility].  Patient daughter would like to know from Dr Artist Pais if patient is in [place] Failure. Follow-up by: [name],  February 11, 2010 8:58 AM  Additional Follow-up for Phone Call Additional follow up Details #1::        she is not in kidney failure.  she has mild renal insuff Additional Follow-up by: [name] DO,  February 16, 2010 1:16 PM    Additional Follow-up for Phone Call Additional follow up Details #2::    attempted to contact patients daughter Rita Miller at [phone], no answer. A detailed voice message was left informing patients daughter per Dr Artist Pais instructions. Rita Miller was advised to call back if any additional questions or concerns Follow-up by: [name],  February 17, 2010 9:10 AM

## 2010-09-07 NOTE — Miscellaneous (Signed)
Summary: INR Order/[facility]  INR Order/[facility]   Imported By: Lanelle Bal 03/03/2010 10:17:48  _____________________________________________________________________  External Attachment:    Type:   Image     Comment:   External Document

## 2010-09-07 NOTE — Letter (Signed)
Summary: [facility]  [facility]   Imported By: Lanelle Bal 08/26/2009 10:58:43  _____________________________________________________________________  External Attachment:    Type:   Image     Comment:   External Document

## 2010-09-07 NOTE — Miscellaneous (Signed)
Summary: ST Order & PT Draw Order/[facility]  ST Order & PT Draw Order/[facility]   Imported By: Lanelle Bal 09/01/2009 09:24:00  _____________________________________________________________________  External Attachment:    Type:   Image     Comment:   External Document

## 2010-09-07 NOTE — Progress Notes (Signed)
Summary: [facility] needs an order   Phone Note Call from Patient Call back at [phone]   Caller: patient daughter [name] Call For: Artist Pais  Summary of Call: She is in [place].  They need an order from Dr Artist Pais to give [facility] her oxygen, nebulizer and Melatonin and a hospital bed.  And when does she need her coumaden checked again   Initial call taken by: Roselle Locus,  February 25, 2010 9:34 AM  Follow-up for Phone Call        I suggest OV tomorrow. we can draw INR at or before OV Follow-up by: [name] DO,  February 25, 2010 12:50 PM  Additional Follow-up for Phone Call Additional follow up Details #1::        call returned to patients daughter [name], she state she has admitted her mom  to a assisted living facility and they are waiting on the completion of the FL2 form. She could not confirm if she would be able to bring patient in as instructed. Please advise Additional Follow-up by: [name],  February 25, 2010 2:20 PM    Additional Follow-up for Phone Call Additional follow up Details #2::    Pls call [name] on her cell, she is just checking to see if the form has been sent Diane Tomerlin  February 25, 2010 3:26 PM  [facility] form has been completed. Patient daughter advised left at front desk for pick. Order For Inr draw today has been faxed to assisted living center Follow-up by: [name],  February 25, 2010 4:25 PM

## 2010-09-07 NOTE — Progress Notes (Signed)
Summary: Lab Results  Phone Note Outgoing Call   Summary of Call: call pt - INR too high.  hold coumadin x 2 days then restart lower dose.  see rx Initial call taken by: [name] DO,  February 02, 2010 11:52 AM  Follow-up for Phone Call        patients daughter [name] called at  [phone], she was advised per Dr Artist Pais instructions. She request that we cancel patients appointment for this afternoon. Patient does not have any future appointments of file for follow up with Dr Artist Pais Follow-up by: [name],  February 02, 2010 12:11 PM  Additional Follow-up for Phone Call Additional follow up Details #1::        noted Additional Follow-up by: [name] DO,  February 02, 2010 1:40 PM    New/Updated Medications: COUMADIN 4 MG TABS (WARFARIN SODIUM) one by mouth once daily [BMN] Prescriptions: COUMADIN 4 MG TABS (WARFARIN SODIUM) one by mouth once daily Brand medically necessary #30 x 2   Entered and Authorized by:   [name] DO   Signed by:   [name] DO on 02/02/2010   Method used:   [facility]        [phone][facility]       [address] [place], [place]  [address]       Ph: [phone] or [phone]       Fax: [phone]   RxID:   [id number]

## 2010-09-07 NOTE — Progress Notes (Signed)
Summary: Home Day Care Option  Phone Note Call from Patient Call back at [phone]   Caller: Daughter Reason for Call: Talk to Nurse Summary of Call: SW Rita Miller, pls call, she needs an order for 8 hours home day care Initial call taken by: Lannette Donath,  February 09, 2010 9:01 AM  Follow-up for Phone Call        Please see referral order for [facility] for home care Evaluation on 01/25/2010 Follow-up by: [name],  February 09, 2010 9:12 AM

## 2010-09-07 NOTE — Miscellaneous (Signed)
Summary: diagnostic left breast mammogram  Clinical Lists Changes  Observations: Added new observation of MAMMOGRAM: normal left (11/03/2009 16:49)      Preventive Care Screening  Mammogram:    Date:  11/03/2009    Results:  normal left

## 2010-09-07 NOTE — Progress Notes (Signed)
Summary: PT/INR results  Phone Note From Other Clinic Call back at [phone]   Caller: Darnelle Maffucci- [facility] Summary of Call: [name] called and left voice message stating patient had her PT and INR drawn yesterday. Her Pt was 24.5 and INR was 2.36. She would like to know if when patient should have her next check,and if there is any change her her current dosing of coumadin at 2mg . -Dr Artist Pais is out of the office today.Please advise.  Initial call taken by: [name],  June 18, 2010 1:50 PM  Follow-up for Phone Call        Call placed to cardiology; spoke with Sall the pharmacist at the [facility]. She advises that patient continue with current dosage of 2mg  daily and recheck her PT/INR in  2-3 weeks.   Call returned to [place] at [phone]. she was informed per Kennon Rounds, the pharmacist with the [facility]. [name] stated the DON [name] has decided that they will manage patient Coumadin and INR there, due to timely response. [name] stated the problem they were running into was the patient was refusing to have labs drawn at there facility. However, she stated patient did allow them to draw there yesterday. She states they will forward the results to Dr Artist Pais so that he is aware of her ranges, but they will  manage.  [name] was informed that I would forward the information to Dr Artist Pais and if there were any changes she would recieve a return phone call. Follow-up by: [name],  June 18, 2010 4:38 PM  Additional Follow-up for Phone Call Additional follow up Details #1::        plz clarify,  is coumadin clinic managing coumadin for [facility]? Additional Follow-up by: [name] DO,  June 20, 2010 8:52 PM    Additional Follow-up for Phone Call Additional follow up Details #2::    [name]  will be managing patients coumadin [name]  June 21, 2010 12:20 PM   noted.  [name] DO  June 21, 2010 5:08 PM

## 2010-09-07 NOTE — Progress Notes (Signed)
Summary: pacemaker  Phone Note Call from Patient Call back at [phone]   Caller: [name] Reason for Call: Talk to Nurse Summary of Call: pt believes that her pacemaker has moved... spoke with device clinic was told she could come see Dr Johney Frame next avail which is in March, caregiver states she could not wait that long, states she is in pain Initial call taken by: Migdalia Dk,  September 08, 2009 8:55 AM  Follow-up for Phone Call        Appt made for Dr Johney Frame Friday at 10:10AM.  Lela notified patient. Gypsy Balsam RN BSN  September 08, 2009 11:48 AM

## 2010-09-07 NOTE — Miscellaneous (Signed)
Summary: Eval Order/[facility]  Eval Order/[facility]   Imported By: Lanelle Bal 12/16/2009 09:38:38  _____________________________________________________________________  External Attachment:    Type:   Image     Comment:   External Document

## 2010-09-07 NOTE — Progress Notes (Signed)
Summary: [facility] Nurse needs a order to read her INR froom home  Phone Note Call from Patient   Caller: Mom Details for Reason: wants Dr.Verita Kuroda to give the Home health nurse a order to read INR Summary of Call: DaughterToni Saunder's phone [phone].... [facility] nurse-Shelly that Dr.Pamelyn Bancroft set Ms.Alicia up with stated that she could read pt's INR so she does not have to come into the office for that if she could just get orders for that. Pt's daughter was calling to cancel her INR reading for today here at our lab due to weather conditions but wanted you to know that the home health nurse can do this if she has a order from Dr.Lissette Schenk.Call back daughter and let her know the status on this. Initial call taken by: [name],  August 19, 2009 10:37 AM  Follow-up for Phone Call        see order Follow-up by: [name] DO,  August 19, 2009 11:13 AM  Additional Follow-up for Phone Call Additional follow up Details #1::        Order fax to   [phone]   .sign Additional Follow-up by: Darral Dash,  August 19, 2009 11:50 AM

## 2010-09-07 NOTE — Progress Notes (Signed)
Summary: Note for Daycare  Phone Note Call from Patient Call back at [phone]   Caller: Daughter- Dossie Arbour Call For: [name] DO Summary of Call: patients daughter called and left voice message stating  [facility] is requsting a note from the Dr stating it is okay to use her inhaler at Day Care program Initial call taken by: [name],  Dec 16, 2009 4:55 PM  Follow-up for Phone Call        ok to send letter approving use of inhaler at daycare Follow-up by: [name] DO,  Dec 16, 2009 9:17 PM  Additional Follow-up for Phone Call Additional follow up Details #1::        Order faxed to [facility]  Additional Follow-up by: [name],  Dec 17, 2009 5:19 PM

## 2010-09-07 NOTE — Assessment & Plan Note (Signed)
Summary: hospital f/u - jr   Vital Signs:  Patient profile:   77 year old female Weight:      106 pounds BMI:     20.78 O2 Sat:      99 % on Room air Temp:     97.9 degrees F oral Pulse rate:   70 / minute Pulse rhythm:   regular Resp:     20 per minute BP sitting:   140 / 70  (right arm) Cuff size:   regular  Vitals Entered By: [name] (October 08, 2009 2:47 PM)  O2 Flow:  Room air CC: Rm 3- Hospital Follow up Comments rx for exleon patch, and alprazolam has not been picked up, questions about medication change, hydrocodone, coumadin, inhaler   Primary Care Provider:  [name] DO  CC:  Rm 3- Hospital Follow up.  History of Present Illness: admitted to [facility].    DC'ed to [facility] - SNF.   plan is for discharge 10/25/09 daughter concerned they have increased coumadin dose last INR unknown no bleeding  she still has intermittent right sided rib pain.  daughter concerned about mother taking hydrocodone  memory loss - they stopped exelon patch during rehab  Allergies: 1)  ! Percocet 2)  ! Morphine Sulfate (Morphine Sulfate) 3)  Aricept  Past History:  Past Medical History: CORONARY ARTERY DISEASE (ICD-414.00) CEREBROVASCULAR ACCIDENT, HX OF (ICD-V12.50) ATRIAL FIBRILLATION (ICD-427.31)  HYPERLIPIDEMIA (ICD-272.4) COPD (ICD-496)    EPISTAXIS, RECURRENT (ICD-784.7)  OSTEOPOROSIS (ICD-733.00)  HYPOTHYROIDISM (ICD-244.9)   PULMONARY EMBOLISM, HX OF (ICD-V12.51) DIVERTICULITIS, HX OF (ICD-V12.79) H/O peptic ulcer disease CERVICAL CANCER, HX OF (ICD-V10.41) BREAST CANCER, HX OF (ICD-V10.3) ASTHMA (ICD-493.90) ANEMIA-IRON DEFICIENCY (ICD-280.9) Blood transfusion  Hx of DVT Hx LBBB Severe MR; MV repair 7/10 Severe Aortic Stenosis Bradycardia, s/p pacemaker implant  Past Surgical History: H/O thyroidectomy due to cancer Total hip replacement Hysterectomy  Maectomy   Mitral valve repair   Left Eye Implant    S/P  Pacemaker    Family History: Family History Breast cancer 1st degree relative <50 Family History of Colon CA 1st degree relative <60 Family History Diabetes 1st degree relative Family History High cholesterol Family History Hypertension   Mother with unknown heart disease         Social History: Retired Widow  1 daughter  Tobacco Use - Former.    Alcohol Use - no        Physical Exam  General:  frail 77 y/o Head:  normocephalic and atraumatic.   Neck:  supple and no masses.   Lungs:  normal respiratory effort, normal breath sounds, no crackles, and no wheezes.   Heart:  normal rate, regular rhythm, and no gallop.   Abdomen:  soft, non-tender, and normal bowel sounds.   Extremities:  No lower extremity edema  Neurologic:  cranial nerves II-XII intact.   Psych:  normally interactive and good eye contact.     Impression & Recommendations:  Problem # 1:  BRONCHITIS, ACUTE WITH BRONCHOSPASM (ICD-466.0) Assessment Improved continue combivent and flovent.   avoid b blocker due to risk of bronchospasm  The following medications were removed from the medication list:    Combivent 103-18 Mcg/act Aero (Albuterol-ipratropium) .Marland Kitchen... 2 inh. q4-6h as needed Her updated medication list for this problem includes:    Flovent Hfa 44 Mcg/act Aero (Fluticasone propionate  hfa) .Marland Kitchen... 2 puffs by mouth once daily    Combivent 103-18 Mcg/act Aero (Albuterol-ipratropium) .Marland KitchenMarland KitchenMarland KitchenMarland Kitchen 2  inh. q4-6h as needed    Tussionex Pennkinetic Er 8-10 Mg/88ml Lqcr (Chlorpheniramine-hydrocodone) ..... Sig 1/2tsp to 1 tsp by mouth twice aday prn fo cough please start w/ 1/2 a tsp    Ceftin 500 Mg Tabs (Cefuroxime axetil) .Marland Kitchen... Take 1 tablet by mouth two times a day  Problem # 2:  COUMADIN THERAPY (ICD-V58.61) rehab facility is currently managing coumadin.  Problem # 3:  ATRIAL FIBRILLATION (ICD-427.31) rate controlled.  Maintain current medication regimen.  Her updated medication list for this problem  includes:    Coumadin 4 Mg Tabs (Warfarin sodium) ..... One by mouth once daily    Cardizem 120 Mg Tabs (Diltiazem hcl) .Marland Kitchen... Take 1 tablet by mouth once a day    Aspirin 81 Mg Tbec (Aspirin) ..... One by mouth every day  Reviewed the following: PT: 27.1 (08/12/2009)   INR: 2.54 (08/12/2009) Coumadin Dose (weekly): 28 mg (08/05/2009) Prior Coumadin Dose (weekly): 28 mg (08/05/2009) Next Protime: 08/12/2009 (dated on 08/05/2009)  Problem # 4:  MEMORY LOSS (ICD-780.93) restart exelon patch  Problem # 5:  CORONARY ARTERY DISEASE (ICD-414.00) stable.  dc quinapril due to risk of bronchospasm.  use diovan  Her updated medication list for this problem includes:    Cardizem 120 Mg Tabs (Diltiazem hcl) .Marland Kitchen... Take 1 tablet by mouth once a day    Diovan 80 Mg Tabs (Valsartan) ..... One by mouth once daily    Aspirin 81 Mg Tbec (Aspirin) ..... One by mouth every day  Complete Medication List: 1)  Coumadin 4 Mg Tabs (Warfarin sodium) .... One by mouth once daily 2)  Cardizem 120 Mg Tabs (Diltiazem hcl) .... Take 1 tablet by mouth once a day 3)  Synthroid 100 Mcg Tabs (Levothyroxine sodium) .... Take 1 tablet by mouth every morning 4)  Diovan 80 Mg Tabs (Valsartan) .... One by mouth once daily 5)  Flovent Hfa 44 Mcg/act Aero (Fluticasone propionate  hfa) .... 2 puffs by mouth once daily 6)  Aspirin 81 Mg Tbec (Aspirin) .... One by mouth every day 7)  Mirtazapine 15 Mg Tabs (Mirtazapine) .... One  tab by mouth at bedtime 8)  Namenda 5 Mg Tabs (Memantine hcl) .... One by mouth two times a day 9)  Lidoderm 5 % Ptch (Lidocaine) .... Apply to affected ared every 12 hours as needed 10)  Combivent 103-18 Mcg/act Aero (Albuterol-ipratropium) .... 2 inh. q4-6h as needed 11)  Tussionex Pennkinetic Er 8-10 Mg/58ml Lqcr (Chlorpheniramine-hydrocodone) .... Sig 1/2tsp to 1 tsp by mouth twice aday prn fo cough please start w/ 1/2 a tsp 12)  Alprazolam 0.25 Mg Tabs (Alprazolam) .... 1/2 to one tab  by mouth at  bedtime as needed for sleep 13)  Exelon 4.6 Mg/24hr Pt24 (Rivastigmine) .... Apply new patch  daily 14)  Ceftin 500 Mg Tabs (Cefuroxime axetil) .... Take 1 tablet by mouth two times a day 15)  Prednisone 20 Mg Tabs (Prednisone) .... 3 tablets by mouth once daily  Patient Instructions: 1)  Please schedule a follow-up appointment in 1 month. 2)  BMP prior to visit, ICD-9:  401.9 3)  TSH prior to visit, ICD-9: 401.9 4)  INR:  427.31 5)  Please return for lab work one (1) week before your next appointment.  Prescriptions: DIOVAN 80 MG TABS (VALSARTAN) one by mouth once daily  #30 x 3   Entered and Authorized by:   [name] DO   Signed by:   [name] DO on 10/08/2009   Method used:  Electronically to        [facility] [phone]* (retail)       [address] [place], [place]  [address]       Ph: [phone] or [phone]       Fax: [phone]   RxID:   [id number]   Current Allergies (reviewed today): ! PERCOCET ! MORPHINE SULFATE (MORPHINE SULFATE) ARICEPT

## 2010-09-07 NOTE — Progress Notes (Signed)
Summary: Consult Note  Consult Note   Imported By: Lanelle Bal 05/20/2010 11:42:04  _____________________________________________________________________  External Attachment:    Type:   Image     Comment:   External Document

## 2010-09-07 NOTE — Progress Notes (Signed)
Summary: form for adult day care  Phone Note Call from Patient   Caller: patient daughter Call For: yoo  Summary of Call: toni dropped off a form for the senior center for daycare for patient and they state they have not gotten the form back from Dr Artist Pais yet Initial call taken by: Roselle Locus,  November 09, 2009 4:39 PM  Follow-up for Phone Call        forms have been recieved and patients daughter is aware Follow-up by: [name],  November 12, 2009 2:35 PM

## 2010-09-07 NOTE — Progress Notes (Signed)
Summary: WHEN DOES PATIENT NEED COUMADEN CHECKED   Phone Note Call from Patient Call back at [phone]   Caller: PATIENT DAUGHTER Call For: [facility]  Summary of Call: WHEN DOES PATIENT NEED HER COIUMADEN CHECKED SHE IS TAKING 2 MG AT NIGHT  Initial call taken by: Roselle Locus,  February 17, 2010 11:17 AM  Follow-up for Phone Call        I suggest INR and CBCD today. if she can not come to lab then have home nurse draw labs use Afib code Follow-up by: [name] DO,  February 17, 2010 11:56 AM  Additional Follow-up for Phone Call Additional follow up Details #1::        attempted to contact patients daughter [name] at [phone],XB answer. A detailed voice message was left informing patient per Dr Artist Pais instructions Additional Follow-up by: [name],  February 17, 2010 1:36 PM

## 2010-09-07 NOTE — Procedures (Signed)
Summary: per night message/ok per kristen/lg   Current Medications (verified): 1)  Coumadin 5 Mg Tabs (Warfarin Sodium) .... Take 1 Tablet By Mouth Once A Day 2)  Cartia Xt 240 Mg Xr24h-Cap (Diltiazem Hcl Coated Beads) .... One By Mouth Once Daily 3)  Synthroid 100 Mcg Tabs (Levothyroxine Sodium) .... Take 1 Tablet By Mouth Every Morning 4)  Lisinopril 5 Mg Tabs (Lisinopril) .... One By Mouth Once Daily 5)  Flovent Hfa 44 Mcg/act Aero (Fluticasone Propionate  Hfa) .... 2 Puffs By Mouth Once Daily 6)  Aspirin 81 Mg  Tbec (Aspirin) .... One By Mouth Every Day 7)  Namenda 5 Mg Tabs (Memantine Hcl) .... One By Mouth Two Times A Day 8)  Exelon 4.6 Mg/24hr Pt24 (Rivastigmine) .... Apply New Patch  Daily 9)  Lipitor 20 Mg Tabs (Atorvastatin Calcium) .... One By Mouth Once Daily 10)  Lidoderm 5 % Ptch (Lidocaine) .... Apply Patch To Right Chest Wall 12 Hrs Per Day As Needed For Pain 11)  Tylenol 8 Hour 650 Mg Cr-Tabs (Acetaminophen) .... One Tabl By Mouth Every 8 Hours As Needed Pain 12)  Albuterol Sulfate (2.5 Mg/54ml) 0.083% Nebu (Albuterol Sulfate) .... Qid As Needed For Weheezing or Sob  Allergies (verified): 1)  ! Percocet 2)  ! Morphine Sulfate (Morphine Sulfate) 3)  Aricept  PPM Specifications Following MD:  Hillis Range, MD     PPM Vendor:  [employer]     PPM Model Number:  [id number]     PPM Serial Number:  [id number] PPM DOI:  07/12/2006     PPM Implanting MD:  NOT IMPLANTED HERE  Lead 1    Location: RA     DOI: 01/12/1998     Model #: [id number]     Serial #: [id number]     Status: active Lead 2    Location: RV     DOI: 01/12/1998     Model #: [id number]     Serial #: [id number]     Status: active   Indications:  AFIB   PPM Follow Up Battery Voltage:  2.77 V     Battery Est. Longevity:  60YRS     Pacer Dependent:  No       PPM Device Measurements Atrium  Amplitude: 1.00 mV, Impedance: 886 ohms,  Right Ventricle  Amplitude: 5.60 mV, Impedance: 2193 ohms, Threshold: 1.00 V at 0.40  msec  Episodes MS Episodes:  330     Percent Mode Switch:  94.7%     Coumadin:  Yes Ventricular High Rate:  0     Atrial Pacing:  76.8%     Ventricular Pacing:  79.5%  Parameters Mode:  DDD     Lower Rate Limit:  70     Upper Rate Limit:  130 Paced AV Delay:  310     Sensed AV Delay:  310 Next Cardiology Appt Due:  10/11/2010 Tech Comments:  PT SCHEDULED FOR DEVICE CHECK DUE TO PACER BEING SORE AROUND SITE.  PACER IS VERY SUPERFICIAL AND IS VERY TENDER TO TOUCH.  PT COMPLAINS OF SWELLING AND REDNESS-- MINIMAL SWELLING W/NO REDNESS. PT TO HAVE CHEST XR AS WELL PER DR YOO. NORMAL DEVICE FUNCTION.  NO CHANGES MADE. ROV IN 6 MTHS W/JA. Vella Kohler  April 20, 2010 3:41 PM MD Comments:  device parameters are stable ongoing pain is noted,  When I examined the pocket in February, it looked OK. We could consider submuscular relocation of the device if it continues to be a problem for  this patient, though I would prefer to avoid accessing the pocket unnecessarily due to risks of infection.

## 2010-09-07 NOTE — Miscellaneous (Signed)
Summary: Med & Hospital Bed Orders/[facility]  Med & Hospital Bed Orders/[facility]   Imported By: Lanelle Bal 03/03/2010 10:21:27  _____________________________________________________________________  External Attachment:    Type:   Image     Comment:   External Document

## 2010-09-07 NOTE — Progress Notes (Signed)
Summary: lab result  Phone Note Outgoing Call   Summary of Call: call pt - anemia is stable.  thyroid function normal.   Initial call taken by: [name] DO,  July 09, 2010 9:23 AM  Follow-up for Phone Call        Pt's daughter, [name] has been notified. Nicki Guadalajara Fergerson CMA Duncan Dull)  July 09, 2010 10:26 AM

## 2010-09-07 NOTE — Medication Information (Signed)
Summary: Titrate Nam[facility]  T[facility]rate [facility]   Imported By: Lanelle Bal 06/25/2010 11:21:10  _____________________________________________________________________  External Attachment:    Type:   Image     Comment:   External Document

## 2010-09-07 NOTE — Letter (Signed)
Summary: Device-Delinquent Phone Journalist, newspaper, Main Office  [address]   [place], [place] [address]   Phone: [phone]  Fax: [phone]     February 23, 2010 MRN: [id number]   Rita Miller [address] [place], [place]  [address]   Dear Ms. Westmoreland,  According to our records, you were scheduled for a device phone transmission on 12-30-2009.     We did not receive any results from this check.  If you transmitted on your scheduled day, please call us to help troubleshoot your system.  If you forgot to send your transmission, please send one upon receipt of this letter.  Thank you,   Architectural technologist [facility]

## 2010-09-07 NOTE — Progress Notes (Signed)
Summary: dtr calling back-dtr. to call & make appt.  Phone Note Call from Patient   Caller: Daughter Christell Constant [phone] Reason for Call: Talk to Nurse Summary of Call: pt's dtr returning call please call after 430p Initial call taken by: Glynda Jaeger,  June 01, 2010 12:21 PM  Follow-up for Phone Call        sch. 11/22 @ 10am with Dr. Clifton James. Whitney Maeola Sarah RN  June 07, 2010 3:00 PM  Follow-up by: Whitney Maeola Sarah RN,  June 07, 2010 3:00 PM

## 2010-09-07 NOTE — Letter (Signed)
Summary: [facility]  [facility]   Imported By: Lanelle Bal 07/16/2010 13:04:09  _____________________________________________________________________  External Attachment:    Type:   Image     Comment:   External Document

## 2010-09-07 NOTE — Miscellaneous (Signed)
Summary: Episode Summary/[facility]  Episode Summary/[facility]   Imported By: Lanelle Bal 10/15/2009 07:58:44  _____________________________________________________________________  External Attachment:    Type:   Image     Comment:   External Document

## 2010-09-07 NOTE — Progress Notes (Signed)
Summary: Lab Work  Phone Note From [employer] back at [phone]   Caller: Fabienne Bruns Summary of Call: Rita Miller states patient wanted to know if [facility] could get an order to draw patient PT/INR Initial call taken by: [name],  August 21, 2009 12:00 PM  Follow-up for Phone Call        Rita Miller is in the [place]   order fax to [phone]  [facility]    Follow-up by: Darral Dash,  August 21, 2009 1:33 PM

## 2010-09-07 NOTE — Progress Notes (Signed)
Summary: 02 Saturation & PT results  Phone Note Outgoing Call Call back at [facility] Phone [phone]   Call placed by: [name],  August 13, 2009 5:10 PM Summary of Call: patients daughter Dossie Arbour advised to have patient continue with same Coumadin dose. She was also advised the patient pacemaker would not have any effect on the Bone Density test. She verbalizaed Understanding and agrees as instructed per Dr Artist Pais. Initial call taken by: [name],  August 13, 2009 5:14 PM  Follow-up for Phone Call        [facility] Loraine Leriche- called and left voice message stating ahome visit was made 08/12/2009 and  patients 02 Saturation was 86. His message states that she is currently on 2L at night and wanted to know if patient would benefit from using 02 during the day as well.  If order for additional 02 is needed , he states a  order could be phoned to [phone] Follow-up by: [name],  August 13, 2009 5:16 PM  Additional Follow-up for Phone Call Additional follow up Details #1::        Orders fax to [facility]  [phone] Additional Follow-up by: Darral Dash,  August 14, 2009 8:34 AM

## 2010-09-07 NOTE — Progress Notes (Signed)
Summary: Physical Therapy has a question Re: Rita Miller  Phone Note From Other Clinic   Caller: Nurse Call For: Dr.Zela Sobieski Details for Reason: [facility], Trey Paula Summary of Call: Called to let Dr.Suzzette Gasparro know that pt. didn't have P.T. today because she states she tripped over a trash can this A.M. and went down on Lt. Knee, Pt. states No pain. Has hx. of Left Hip Fx., No bruising or swelling. Do you need to see pt. before P.T. starts  again? Call  Physical Therapy back at [phone].[name]  September 04, 2009 3:15 PM   Follow-up for Phone Call        she can resume PT as long as pt does not have knee pain Follow-up by: [name] DO,  September 04, 2009 5:04 PM  Additional Follow-up for Phone Call Additional follow up Details #1::        Physical Therapist advised per Dr Artist Pais instructions Additional Follow-up by: [name],  September 04, 2009 5:33 PM

## 2010-09-07 NOTE — Miscellaneous (Signed)
Summary: FL2/[facility]   Imported By: Lanelle Bal 11/19/2009 12:54:30  _____________________________________________________________________  External Attachment:    Type:   Image     Comment:   External Document

## 2010-09-07 NOTE — Miscellaneous (Signed)
Summary: Fax Regarding Speech Eval & MBSS Results/[facility]  Fax Regarding Speech Eval & MBSS Results/[facility]   Imported By: Lanelle Bal 09/10/2009 11:17:16  _____________________________________________________________________  External Attachment:    Type:   Image     Comment:   External Document

## 2010-09-07 NOTE — Consult Note (Signed)
Summary: [facility]  [facility]   Imported By: Lanelle Bal 09/07/2009 12:48:45  _____________________________________________________________________  External Attachment:    Type:   Image     Comment:   External Document

## 2010-09-09 NOTE — Miscellaneous (Signed)
Summary: Orders Update  Clinical Lists Changes  Orders: Added new Test order of T-Protime, Auto ([id number]) - Signed 

## 2010-09-09 NOTE — Progress Notes (Signed)
  Phone Note Other Incoming   Summary of Call: fax received from [facility] - 07/22/2010 INR 3.09.  pt on levaquin for chronic cellulitis instruction given to hold coumadin x 2 days then restart 3 mg repeat INR in 1 week Initial call taken by: [name] DO,  July 26, 2010 9:57 AM

## 2010-09-09 NOTE — Miscellaneous (Signed)
Summary: [facility]  [facility]   Imported By: Lanelle Bal 07/28/2010 11:11:28  _____________________________________________________________________  External Attachment:    Type:   Image     Comment:   External Document

## 2010-09-09 NOTE — Progress Notes (Signed)
Summary: [name]  [facility]   Imported By: Lanelle Bal 08/05/2010 14:25:00  _____________________________________________________________________  External Attachment:    Type:   Image     Comment:   External Document

## 2010-09-09 NOTE — Miscellaneous (Signed)
Summary: [facility]   Med Orders/[facility]   Imported By: Lanelle Bal 08/19/2010 12:16:58  _____________________________________________________________________  External Attachment:    Type:   Image     Comment:   External Document

## 2010-09-09 NOTE — Miscellaneous (Signed)
Summary: Coumadin [facility]  Coumadin [facility]   Imported By: Lanelle Bal 08/06/2010 13:03:29  _____________________________________________________________________  External Attachment:    Type:   Image     Comment:   External Document

## 2010-09-09 NOTE — Progress Notes (Signed)
Summary: CT Results  Phone Note Outgoing Call   Summary of Call: call pt - no significant change in appearance of foot on CT take levaquin as directed.   ROV 1 week Initial call taken by: [name] DO,  July 16, 2010 4:04 PM  Follow-up for Phone Call        call placed to patients daughter [name] at [phone], no answer. A detailed voice message was left informing patients daughter per Dr Artist Pais instructions. Message was left for her to call back if any questions Follow-up by: [name],  July 16, 2010 4:18 PM

## 2010-09-09 NOTE — Progress Notes (Signed)
Summary: Consult Report/[facility]   Imported By: Lanelle Bal 07/24/2010 11:17:08  _____________________________________________________________________  External Attachment:    Type:   Image     Comment:   External Document

## 2010-09-09 NOTE — Medication Information (Signed)
Summary: Exelon[facility]  [facility]   Imported By: Lanelle Bal 08/05/2010 14:33:43  _____________________________________________________________________  External Attachment:    Type:   Image     Comment:   External Document

## 2010-09-09 NOTE — Progress Notes (Signed)
Summary: status update for daughter  Phone Note Outgoing Call   Call placed by: [name],  August 26, 2010 5:23 PM Call placed to: patients daughter-Rita Miller Summary of Call: call placed to patients daughter Rita Miller [phone]. She was informed that I was following up on patients status with the foot soaks. She has been informed per Dr Artist Pais that patient is not to have foot soaks. Rita Miller states she has been advised by the nursing facility not to soak patients feet. Rita Miller states that she has been wanting to soak her moms feet at home, but she has not been able to, but she will not soak her feet per Dr Artist Pais instructions Initial call taken by: [name],  August 26, 2010 5:29 PM

## 2010-09-09 NOTE — Miscellaneous (Signed)
Summary: Physicians Orders[facility]  Physicians Orders/[facility]   Imported By: Lanelle Bal 07/20/2010 13:10:28  _____________________________________________________________________  External Attachment:    Type:   Image     Comment:   External Document

## 2010-09-09 NOTE — Miscellaneous (Signed)
Summary: Exelon [facility]  [facility]   Imported By: Lanelle Bal 08/05/2010 14:31:30  _____________________________________________________________________  External Attachment:    Type:   Image     Comment:   External Document

## 2010-09-09 NOTE — Assessment & Plan Note (Signed)
Summary: 2 week follow up/mhf   Vital Signs:  Patient profile:   77 year old female Height:      60 inches Weight:      109 pounds BMI:     21.36 O2 Sat:      97 % on Room air Temp:     97.8 degrees F oral Pulse rate:   78 / minute Resp:     20 per minute BP sitting:   130 / 60  (left arm) Cuff size:   regular  Vitals Entered By: [name] (July 23, 2010 10:37 AM)  O2 Flow:  Room air CC: 2 Week Follow up Is Patient Diabetic? No Pain Assessment Patient in pain? no        Primary Care Provider:  [name] DO  CC:  2 Week Follow up.  History of Present Illness: 76 y/o female for f/u slight improvement in appearance of left foot reviewed CT results she denies fever or chills  still has intermittent left breast tenderness  Preventive Screening-Counseling & Management  Alcohol-Tobacco     Smoking Status: quit  Allergies: 1)  ! Percocet 2)  ! Morphine Sulfate (Morphine Sulfate) 3)  Aricept  Past History:  Past Medical History: CORONARY ARTERY DISEASE (ICD-414.00) CEREBROVASCULAR ACCIDENT, HX OF (ICD-V12.50) ATRIAL FIBRILLATION (ICD-427.31)   HYPERLIPIDEMIA (ICD-272.4)   COPD (ICD-496)         EPISTAXIS, RECURRENT (ICD-784.7)    OSTEOPOROSIS (ICD-733.00)   HYPOTHYROIDISM (ICD-244.9)   PULMONARY EMBOLISM, HX OF (ICD-V12.51) DIVERTICULITIS, HX OF (ICD-V12.79) H/O peptic ulcer disease CERVICAL CANCER, HX OF (ICD-V10.41) BREAST CANCER, HX OF (ICD-V10.3) ASTHMA (ICD-493.90) ANEMIA-IRON DEFICIENCY (ICD-280.9) Blood transfusion  Hx of DVT Hx LBBB Severe MR; MV repair 7/10 Severe Aortic Stenosis Bradycardia, s/p pacemaker implant Severe PAD - left leg with occluded Viabahn stents x 3 left SFA.  Past Surgical History: H/O thyroidectomy due to cancer Total hip replacement Hysterectomy    Mastectomy       Mitral valve repair     Left Eye Implant    S/P Pacemaker      Physical Exam  General:  alert.   Breasts:  no masses.  left  lateral breast tenderness.  no skin changes Lungs:  normal respiratory effort and normal breath sounds.   Heart:  normal rate and regular rhythm.  SEM II/VI RSB   Impression & Recommendations:  Problem # 1:  CELLULITIS, FOOT (ICD-682.7) Assessment Improved finish course of avelox CT of foot negative for osteo or further progression continue keep foot clean and monitor for deterioration  The following medications were removed from the medication list:    Levaquin 250 Mg Tabs (Levofloxacin) ..... One by mouth once daily  Problem # 2:  ATRIAL FIBRILLATION (ICD-427.31) monitor INR consider abx use  Her updated medication list for this problem includes:    Coumadin 3 Mg Tabs (Warfarin sodium) ..... One by mouth once daily    Cartia Xt 240 Mg Xr24h-cap (Diltiazem hcl coated beads) ..... One by mouth once daily    Aspirin 81 Mg Tbec (Aspirin) ..... One by mouth every day  Orders: T-Protime, Auto ([id number])  Problem # 3:  EPISTAXIS, RECURRENT (ICD-784.7) Assessment: Improved seen by ENT.  they  did not cauterize vessels.  they recommend continued use of nasal saline .  I suggest ayr gel two times a day  Complete Medication List: 1)  Coumadin 3 Mg Tabs (Warfarin sodium) .... One by mouth once daily 2)  Cartia Xt 240  Mg Xr24h-cap (Diltiazem hcl coated beads) .... One by mouth once daily 3)  Synthroid 88 Mcg Tabs (Levothyroxine sodium) .... One by mouth once daily 4)  Flovent Hfa 44 Mcg/act Aero (Fluticasone propionate  hfa) .... 2 puffs by mouth once daily 5)  Aspirin 81 Mg Tbec (Aspirin) .... One by mouth every day 6)  Namenda 5 Mg Tabs (Memantine hcl) .... One by mouth two times a day 7)  Exelon 4.6 Mg/24hr Pt24 (Rivastigmine) .... Apply new patch  daily 8)  Lidoderm 5 % Ptch (Lidocaine) .... Apply patch to right chest wall 12 hrs per day as needed for pain 9)  Albuterol Sulfate (2.5 Mg/43ml) 0.083% Nebu (Albuterol sulfate) .... Qid as needed for weheezing or sob 10)  Melatonin 3  Mg Tabs (Melatonin) .... Take 1 tab by mouth at bedtime as needed 11)  Pedi-dri 100000 Unit/gm Powd (Nystatin) .... Apply three times a day underneath left breast 12)  Triamcinolone Acetonide 0.1 % Crea (Triamcinolone acetonide) .... Apply two times a day to left side of neck x 1 week 13)  Hydrocodone-acetaminophen 2.5-500 Mg Tabs (Hydrocodone-acetaminophen) .... 1/2 tab by mouth two times a day as needed 14)  Vicodin 5-500 Mg Tabs (Hydrocodone-acetaminophen) .... As directed 15)  Tylenol 325 Mg Tabs (Acetaminophen) .... One tablet by mouth every 6 hours as needed pain 16)  Lisinopril 5 Mg Tabs (Lisinopril) .... Take one tablet by mouth daily 17)  Lipitor 20 Mg Tabs (Atorvastatin calcium) .... Take 1 tablet by mouth once a day 18)  Fentanyl 12 Mcg/hr Pt72 (Fentanyl) .... Apply one patch topicall every 72 hours  Patient Instructions: 1)  Please schedule a follow-up appointment in 2 months.   Orders Added: 1)  T-Protime, Auto [[id number]] 2)  Est. Patient Level III [30865]       Current Allergies (reviewed today): ! PERCOCET ! MORPHINE SULFATE (MORPHINE SULFATE) ARICEPT

## 2010-09-09 NOTE — Miscellaneous (Signed)
Summary: Standing Orders & Resident Aware Form/[facility]  Standing Orders & Resident Aware Form/[facility]   Imported By: Lanelle Bal 08/31/2010 14:05:22  _____________________________________________________________________  External Attachment:    Type:   Image     Comment:   External Document

## 2010-09-09 NOTE — Assessment & Plan Note (Signed)
Summary: TOE GREEN/MHF   Vital Signs:  Patient profile:   77 year old female Height:      60 inches Weight:      107.25 pounds BMI:     21.02 O2 Sat:      98 % Temp:     98.0 degrees F oral Pulse rate:   76 / minute Resp:     16 per minute BP sitting:   110 / 60  (left arm) Cuff size:   regular  Vitals Entered By: [name] (July 16, 2010 11:24 AM) CC:  toe discoloration Is Patient Diabetic? No Pain Assessment Patient in pain? yes     Location: Toe Intensity: 10 Type: stinging Onset of pain  Intermittent Comments c/o left big to pain for the past 3-5 days   Primary Care Provider:  [name] DO  CC:   toe discoloration.  History of Present Illness: 77 y/o white female with hx of severe PVD of left lower ext and assoc chronic cellulitis c/o greenish discoloration to top of left foot denies pain, fever or chills no foul odor  Allergies: 1)  ! Percocet 2)  ! Morphine Sulfate (Morphine Sulfate) 3)  Aricept  Past History:  Past Medical History: CORONARY ARTERY DISEASE (ICD-414.00) CEREBROVASCULAR ACCIDENT, HX OF (ICD-V12.50) ATRIAL FIBRILLATION (ICD-427.31)  HYPERLIPIDEMIA (ICD-272.4)   COPD (ICD-496)        EPISTAXIS, RECURRENT (ICD-784.7)     OSTEOPOROSIS (ICD-733.00)   HYPOTHYROIDISM (ICD-244.9)   PULMONARY EMBOLISM, HX OF (ICD-V12.51) DIVERTICULITIS, HX OF (ICD-V12.79) H/O peptic ulcer disease CERVICAL CANCER, HX OF (ICD-V10.41) BREAST CANCER, HX OF (ICD-V10.3) ASTHMA (ICD-493.90) ANEMIA-IRON DEFICIENCY (ICD-280.9) Blood transfusion  Hx of DVT Hx LBBB Severe MR; MV repair 7/10 Severe Aortic Stenosis Bradycardia, s/p pacemaker implant Severe PAD - left leg with occluded Viabahn stents x 3 left SFA.  Past Surgical History: H/O thyroidectomy due to cancer Total hip replacement Hysterectomy    Mastectomy      Mitral valve repair    Left Eye Implant    S/P Pacemaker       Family History: Family History Breast cancer 1st degree  relative <50 Family History of Colon CA 1st degree relative <60 Family History Diabetes 1st degree relative Family History High cholesterol Family History Hypertension   Mother with unknown heart disease                   Social History: Retired Widow  1 daughter    Tobacco Use - Former.    Alcohol Use - no                Physical Exam  General:  alert, well-developed, and well-nourished.   Lungs:  normal respiratory effort and normal breath sounds.   Heart:  normal rate and regular rhythm.  SEM II/VI Skin:  left foot - 2nd -4th digit slightly discolored, non tender,  chronic mottling bottom of left foot no drainage or odor,  no subq air   Impression & Recommendations:  Problem # 1:  CELLULITIS, FOOT (ICD-682.7) Assessment Deteriorated top of left foot now appears dusky.  question early gangrene check CT of foot start Levaquin  Orders: CT without Contrast (CT w/o contrast)  Her updated medication list for this problem includes:    Levaquin 250 Mg Tabs (Levofloxacin) ..... One by mouth once daily  Complete Medication List: 1)  Coumadin 3 Mg Tabs (Warfarin sodium) .... One by mouth once daily 2)  Cartia Xt 240 Mg  Xr24h-cap (Diltiazem hcl coated beads) .... One by mouth once daily 3)  Synthroid 88 Mcg Tabs (Levothyroxine sodium) .... One by mouth once daily 4)  Flovent Hfa 44 Mcg/act Aero (Fluticasone propionate  hfa) .... 2 puffs by mouth once daily 5)  Aspirin 81 Mg Tbec (Aspirin) .... One by mouth every day 6)  Namenda 5 Mg Tabs (Memantine hcl) .... One by mouth two times a day 7)  Exelon 4.6 Mg/24hr Pt24 (Rivastigmine) .... Apply new patch  daily 8)  Lidoderm 5 % Ptch (Lidocaine) .... Apply patch to right chest wall 12 hrs per day as needed for pain 9)  Albuterol Sulfate (2.5 Mg/10ml) 0.083% Nebu (Albuterol sulfate) .... Qid as needed for weheezing or sob 10)  Melatonin 3 Mg Tabs (Melatonin) .... Take 1 tab by mouth at bedtime as needed 11)  Pedi-dri 100000 Unit/gm  Powd (Nystatin) .... Apply three times a day underneath left breast 12)  Triamcinolone Acetonide 0.1 % Crea (Triamcinolone acetonide) .... Apply two times a day to left side of neck x 1 week 13)  Hydrocodone-acetaminophen 2.5-500 Mg Tabs (Hydrocodone-acetaminophen) .... 1/2 tab by mouth two times a day as needed 14)  Vicodin 5-500 Mg Tabs (Hydrocodone-acetaminophen) .... As directed 15)  Tylenol 325 Mg Tabs (Acetaminophen) .... One tablet by mouth every 6 hours as needed pain 16)  Lisinopril 5 Mg Tabs (Lisinopril) .... Take one tablet by mouth daily 17)  Lipitor 20 Mg Tabs (Atorvastatin calcium) .... Take 1 tablet by mouth once a day 18)  Fentanyl 12 Mcg/hr Pt72 (Fentanyl) .... Apply one patch topicall every 72 hours 19)  Levaquin 250 Mg Tabs (Levofloxacin) .... One by mouth once daily  Patient Instructions: 1)  Please schedule a follow-up appointment in 1 week Prescriptions: LEVAQUIN 250 MG TABS (LEVOFLOXACIN) one by mouth once daily  #10 x 0   Entered and Authorized by:   [name] DO   Signed by:   [name] DO on 07/16/2010   Method used:   Print then Give to Patient   RxID:   [id number]    Orders Added: 1)  CT without Contrast [CT w/o contrast] 2)  Est. Patient Level III [32440]    Current Allergies (reviewed today): ! PERCOCET ! MORPHINE SULFATE (MORPHINE SULFATE) ARICEPT

## 2010-09-09 NOTE — Progress Notes (Signed)
Summary: lab order  Phone Note Other Incoming   Caller: Darrelyn Hillock [facility] Summary of Call: Received call from [place] at [facility]. She states that pt has been waiting at [facility] for . and Lab does not have order for PT/INR. Belinda Fisher I will send order now and advised her to call me if there is any further concern. Nicki Guadalajara Fergerson CMA Duncan Dull)  August 13, 2010 2:41 PM

## 2010-09-09 NOTE — Progress Notes (Signed)
Summary: [name]  [facility]   Imported By: Lanelle Bal 08/05/2010 14:35:46  _____________________________________________________________________  External Attachment:    Type:   Image     Comment:   External Document

## 2010-09-09 NOTE — Progress Notes (Signed)
Summary: Foot Soaks  Phone Note Other Incoming Call back at [phone]   Caller: Melissa- [facility] Summary of Call: Efraim Kaufmann from [facility] called and left voice message stating she was informed by patients daughter [name] that Dr Artist Pais wanted patient  to have foots soaks with Epsom salt daily. Her message states they do not have an order to soak patient foots and would like to know if this is something Dr Artist Pais would like the patient to have. Initial call taken by: [name],  August 23, 2010 4:56 PM  Follow-up for Phone Call        No, I think soaking her feet may actually make foot worse.  maintain daily dressing changes.  keep foot clean and dry Follow-up by: [name] DO,  August 23, 2010 5:22 PM  Additional Follow-up for Phone Call Additional follow up Details #1::        call was returned to [facility] with [name] at [phone], she has been advised per Dr Artist Pais instructions  Additional Follow-up by: [name],  August 23, 2010 5:29 PM

## 2010-09-09 NOTE — Progress Notes (Signed)
Summary: PT / INR result  Phone Note Call from Patient   Caller: Call A Nurse Call For: [name] DO Summary of Call: Reason for Call: [name], Facility Nurse is calling results from PT/INR from this am. PT=65.5 and INR=6.69. Coumadin 3 mg daily. 07/22/2010: INR=2.78. on 07/26/2010 MD advised hold Coumadin x 2 days and then restart at 3 mg. Coumadin restarted on 07/29/2010. Patient was hospitalized on 08/02/2010-08/05/2010 at [facility]. Labs have not been evaluated until today. Last nosebleed: 07/13/2010. RN reviewed office orders: hold Coumadin for tonight and contact PCP in the am for further orders. This order shared with [name], Facility Nurse. Initial call taken by: Karenann Cai, Operator  Follow-up for Phone Call        [phone]- [name] Best checking status of Comadin. Medication is currently on Hold. Follow-up by: [name],  August 11, 2010 12:11 PM  Additional Follow-up for Phone Call Additional follow up Details #1::        If current INR is 6.69 I would continue to hold for additional 3 days and repeat INR on 08/13/2010 Additional Follow-up by: [name] DO,  August 11, 2010 12:44 PM    Additional Follow-up for Phone Call Additional follow up Details #2::    call returned to Darnelle Maffucci at [facility] at [phone], she has been informed per Dr Artist Pais instructions.  [name] states the lab draws at the facility are on Thursdays and Monday. She would like to know if it would be okay for the patient to have her lab drawn on the 5th. Follow-up by: [name],  August 11, 2010 2:03 PM  Additional Follow-up for Phone Call Additional follow up Details #3:: Details for Additional Follow-up Action Taken: yes Additional Follow-up by: [name] DO,  August 12, 2010 4:46 PM  call returned to [facility], spoke with Darnelle Maffucci at [phone], she has been advised per Dr Artist Pais. She states that someone from the  facility will draw patient on Friday, and if blood can not be obtained, they would send the patient out for blood draw.  [name]  August 12, 2010 4:55 PM

## 2010-09-09 NOTE — Progress Notes (Signed)
Summary: Lab Results  Phone Note Outgoing Call   Summary of Call: call pt - INR is normal at 2.6.  continue same dose of coumadin.  3 mg daily.  repeat INR in 1 week also inform pt anemia has improved - Hg is 11.1 Initial call taken by: [name] DO,  August 30, 2010 10:35 AM  Follow-up for Phone Call        call placed to [phone] [facility], spoke with Eunice Blase; she has been informed per Dr Artist Pais instructions. They will checks patients INR at their facility and fax results to office Follow-up by: [name],  August 30, 2010 10:46 AM

## 2010-09-09 NOTE — Progress Notes (Signed)
Summary: Swelling of Foot  Phone Note Outgoing Call   Summary of Call: call pt - if left foot swelling getting worse.  I suggest earlier OV Initial call taken by: [name] DO,  August 18, 2010 12:34 PM  Follow-up for Phone Call         Call recieved from [place] with Lawanna Kobus at [facility], she is returning a call regarding a message that was left for patient appointment. She states there is no change in patients foots from previous visits, there is no significant swelling of her foot, she is wearing socks with no problem. She feels the patient does not need to be seen. She was advised if any change in patients foot, to call back to schedule patient for office visit. Angel verbalized understanding and agrees Follow-up by: [name],  August 18, 2010 1:05 PM

## 2010-09-09 NOTE — Progress Notes (Signed)
Summary: Coumadin Dose  Phone Note From Other Clinic   CallerGabriel Cirri  [phone] Call For: YOO  Summary of Call: SHE HAD PT INR ON FRIDAY.  NURSING HOME NEEDS TO KNOW WHAT DOSAGE ON COUMADEN  Initial call taken by: Roselle Locus,  August 16, 2010 3:50 PM  Follow-up for Phone Call        resume 3 mg.  repeat INR in 1 week Follow-up by: [name] DO,  August 16, 2010 5:31 PM  Additional Follow-up for Phone Call Additional follow up Details #1::        call returned to [facility] [phone], she has been informed per Dr Artist Pais instructions Additional Follow-up by: [name],  August 16, 2010 5:33 PM

## 2010-09-09 NOTE — Medication Information (Signed)
Summary: Prior Authorization for Lidoderm/[facility]  Prior Authorization for Lidoderm/[facility]   Imported By: Lanelle Bal 08/31/2010 14:04:33  _____________________________________________________________________  External Attachment:    Type:   Image     Comment:   External Document

## 2010-09-09 NOTE — Progress Notes (Signed)
Summary: left foot swollen and red  Phone Note Other Incoming   Caller: Call A Nurse Summary of Call: [name], RN [name] calling to report that pt's left foot is swollen and red. Pt "always sitting.Home care advice given for leg non injury. Home care advice given for see in 24 hours. No emergent s/s. Protocol(s) Used: Leg Non-Injury Recommended Outcome per Protocol: See Provider within 24 hours Reason for Outcome: New swelling of legs that does NOT resolve with rest and elevation of legs Care Advice:  ~ Call provider if symptoms worsen or new symptoms develop.  ~ SYMPTOM / CONDITION MANAGEMENT Position affected part so it is elevated at least 12 inches (30 cm) above level of heart to improve circulation and decrease discomfort.  ~  ~ CAUTIONS  ~ List, or take, all current prescription(s), nonprescription or alternative medication(s) to provider for evaluation. LEG CARE: - Elevate legs whenever possible, at least twice daily for 20 minutes. - Move frequently; avoid prolonged sitting or standing. - Do not cross legs when sitting. - Consider use of support hose. - Wear loose, non-restrictive clothing.  Initial call taken by: Elita Boone

## 2010-09-10 ENCOUNTER — Telehealth: Payer: Self-pay | Admitting: Internal Medicine

## 2010-09-13 ENCOUNTER — Ambulatory Visit: Payer: Medicare Other | Admitting: Internal Medicine

## 2010-09-13 ENCOUNTER — Encounter: Payer: Self-pay | Admitting: Internal Medicine

## 2010-09-14 ENCOUNTER — Telehealth: Payer: Self-pay | Admitting: Internal Medicine

## 2010-09-15 ENCOUNTER — Ambulatory Visit: Payer: Medicare Other | Admitting: Family

## 2010-09-15 ENCOUNTER — Encounter: Payer: Self-pay | Admitting: Internal Medicine

## 2010-09-15 NOTE — Progress Notes (Signed)
Summary: PT/INR- results  Phone Note Other Incoming Call back at [phone]   Caller: [facility]  Summary of Call: Received fax with patients PT/INR results  PT 47.6  INR 4.77  patients current dose of Coumadin is 3 mg.   Fax # [phone] Initial call taken by: [name],  September 07, 2010 8:52 AM  Follow-up for Phone Call        hold coumadin x 24 hrs then, change dose to 2 mg. Follow-up by: [name] DO,  September 07, 2010 12:55 PM  Additional Follow-up for Phone Call Additional follow up Details #1::        call placed to [facility] [phone], spoke with Efraim Kaufmann she has been advised per Dr Artist Pais instructions, rx faxed to [phone] Additional Follow-up by: [name],  September 07, 2010 1:17 PM    New/Updated Medications: COUMADIN 2 MG TABS (WARFARIN SODIUM) one by mouth once daily [BMN] Prescriptions: COUMADIN 2 MG TABS (WARFARIN SODIUM) one by mouth once daily Brand medically necessary #30 x 5   Entered and Authorized by:   [name] DO   Signed by:   [name] DO on 09/07/2010   Method used:   Printed then faxed to ...       [facility]* (retail)       [address] [place], [place]  [address]       Ph: [phone]       Fax: [phone]   RxID:   [id number]

## 2010-09-15 NOTE — Progress Notes (Signed)
Summary: INR Results  Phone Note Other Incoming Call back at [phone]   Caller: [name] Summary of Call: call from [place] at [facility], patient current INR 1.74, drawn on 2/2 due to patient being on antibiotics. Need to know of medication change Initial call taken by: [name],  September 10, 2010 4:58 PM  Follow-up for Phone Call        give extra dose of 2 mg of coumadin today. then increase dose to 2.5 mg repeat INR in 1 week Follow-up by: [name] DO,  September 10, 2010 5:10 PM  Additional Follow-up for Phone Call Additional follow up Details #1::        call returned to [facility], she has been advised per Dr Artist Pais instructions Additional Follow-up by: [name],  September 10, 2010 5:22 PM    New/Updated Medications: COUMADIN 2.5 MG TABS (WARFARIN SODIUM) one by mouth once daily Prescriptions: COUMADIN 2.5 MG TABS (WARFARIN SODIUM) one by mouth once daily  #30 x 3   Entered and Authorized by:   [name] DO   Signed by:   [name] DO on 09/10/2010   Method used:   Print then Give to Patient   RxID:   [id number]

## 2010-09-15 NOTE — Progress Notes (Signed)
Summary: Consult form/[facility]  Consult form/[facility]   Imported By: Sherian Rein 09/07/2010 11:17:03  _____________________________________________________________________  External Attachment:    Type:   Image     Comment:   External Document

## 2010-09-15 NOTE — Progress Notes (Signed)
Summary: ondansetron approval  Phone Note Other Incoming   Caller: Fax from [employer]  [phone] Reason for Call: Get patient information Summary of Call: Received fax from [facility] that Ondansetron ODT has been approved for coverage from 06/02/10 to 02/27/11. Please advise if pt should still fill rx. I did not see this on her med list.  Initial call taken by: Mervin Kung CMA Duncan Dull),  September 02, 2010 11:14 AM  Follow-up for Phone Call        only use if she is having significant nausea Follow-up by: [name] DO,  September 02, 2010 6:10 PM  Additional Follow-up for Phone Call Additional follow up Details #1::        I could not find documentation of previous rx. Could you please advise strength and directions.  Additional Follow-up by: Mervin Kung CMA Duncan Dull),  September 03, 2010 8:27 AM    Additional Follow-up for Phone Call Additional follow up Details #2::    it may have been prescribed by physician at nursing home plz confirm with staff and NH facility it is typically prescribed as 4 mg - one tab by mouth three times a day as needed Follow-up by: [name] DO,  September 03, 2010 12:36 PM  Additional Follow-up for Phone Call Additional follow up Details #3:: Details for Additional Follow-up Action Taken: Order given to [facility] to use Ondansetron  as needed for significant nausea per Dr Artist Pais.  She stated that the nursing home Physician has never treated pt, maybe rx came from a hospital stay. She will set order to expire on 02/27/11.  Additional Follow-up by: Mervin Kung CMA Duncan Dull),  September 03, 2010 2:12 PM

## 2010-09-15 NOTE — Progress Notes (Signed)
Summary: Urine results  Phone Note Outgoing Call Call back at [phone]   Call placed by: [name],  September 07, 2010 5:01 PM Call placed to: [facility] Summary of Call: call placed to Melissa at [facility], she was asked if patient was experiencing any urinary symptoms. She states patient has increase in confusion, but no urinary complaints. She was advised to have patient increase fluids, if needed she would recieve a call back with additional instructions Initial call taken by: [name],  September 07, 2010 5:04 PM  Follow-up for Phone Call        call placed to [facility] spoke with [facility]. She has been informed of rx, and rx has been faxed  to [phone] Follow-up by: [name],  September 07, 2010 5:13 PM    New/Updated Medications: CEFUROXIME AXETIL 250 MG TABS (CEFUROXIME AXETIL) one by mouth two times a day Prescriptions: CEFUROXIME AXETIL 250 MG TABS (CEFUROXIME AXETIL) one by mouth two times a day  #10 x 0   Entered and Authorized by:   [name] DO   Signed by:   [name] DO on 09/07/2010   Method used:   Printed then faxed to ...         RxID:   [id number]

## 2010-09-15 NOTE — Assessment & Plan Note (Signed)
Summary: foot worse/mhf   Vital Signs:  Patient profile:   77 year old female Height:      60 inches Weight:      103.50 pounds BMI:     20.29 O2 Sat:      96 % on Room air Temp:     97.6 degrees F oral Pulse rate:   82 / minute Resp:     18 per minute BP sitting:   100 / 60  (left arm) Cuff size:   regular  Vitals Entered By: [name] (August 26, 2010 1:38 PM)  O2 Flow:  Room air CC: Foot Pain Comments c/o unresolved left foot pain with swelling,   Primary Care Provider:  [name] DO  CC:  Foot Pain.  History of Present Illness: 77 y/o female with chronic left foot pain daughter has been soaking her feets in epsom salt bath skin on toes dry and cracking toe nails thickened no fever or chills  Preventive Screening-Counseling & Management  Alcohol-Tobacco     Smoking Status: quit  Allergies: 1)  ! Percocet 2)  ! Morphine Sulfate (Morphine Sulfate) 3)  Aricept  Past History:  Past Medical History: CORONARY ARTERY DISEASE (ICD-414.00) CEREBROVASCULAR ACCIDENT, HX OF (ICD-V12.50) ATRIAL FIBRILLATION (ICD-427.31)   HYPERLIPIDEMIA (ICD-272.4)   COPD (ICD-496)          EPISTAXIS, RECURRENT (ICD-784.7)    OSTEOPOROSIS (ICD-733.00)   HYPOTHYROIDISM (ICD-244.9)   PULMONARY EMBOLISM, HX OF (ICD-V12.51) DIVERTICULITIS, HX OF (ICD-V12.79) H/O peptic ulcer disease CERVICAL CANCER, HX OF (ICD-V10.41) BREAST CANCER, HX OF (ICD-V10.3) ASTHMA (ICD-493.90) ANEMIA-IRON DEFICIENCY (ICD-280.9) Blood transfusion  Hx of DVT Hx LBBB Severe MR; MV repair 7/10 Severe Aortic Stenosis Bradycardia, s/p pacemaker implant Severe PAD - left leg with occluded Viabahn stents x 3 left SFA.  Past Surgical History: H/O thyroidectomy due to cancer Total hip replacement Hysterectomy    Mastectomy       Mitral valve repair     Left Eye Implant     S/P Pacemaker      Physical Exam  General:  alert, well-developed, and well-nourished.   Lungs:  normal  respiratory effort and normal breath sounds.   Heart:  normal rate and regular rhythm.  SEM II/VI RSB Skin:  left foot - 2nd -4th digit slightly discolored, non tender,  chronic mottling bottom of left foot no drainage or odor dry scaly skin 2nd to 4th digit thickened toe nails   Impression & Recommendations:  Problem # 1:  CELLULITIS, FOOT (ICD-682.7) Assessment Unchanged  pt advised to stop foot soaks use lotrisone cream as directed. keep feet dry and clean.  Patient advised to call office if symptoms worsen.  Her updated medication list for this problem includes:    Cefuroxime Axetil 250 Mg Tabs (Cefuroxime axetil) ..... One by mouth two times a day  Problem # 2:  ATRIAL FIBRILLATION (ICD-427.31) monitor labs  Her updated medication list for this problem includes:    Coumadin 2 Mg Tabs (Warfarin sodium) ..... One by mouth once daily    Cartia Xt 240 Mg Xr24h-cap (Diltiazem hcl coated beads) ..... One by mouth once daily    Aspirin 81 Mg Tbec (Aspirin) ..... One by mouth every day  Orders: T-CBC w/Diff (16109-60454) T-Protime, Auto (09811-91478)  Complete Medication List: 1)  Coumadin 2 Mg Tabs (Warfarin sodium) .... One by mouth once daily 2)  Cartia Xt 240 Mg Xr24h-cap (Diltiazem hcl coated beads) .... One by mouth once daily 3)  Synthroid 88 Mcg Tabs (Levothyroxine sodium) .... One by mouth once daily 4)  Flovent Hfa 44 Mcg/act Aero (Fluticasone propionate  hfa) .... 2 puffs by mouth once daily 5)  Aspirin 81 Mg Tbec (Aspirin) .... One by mouth every day 6)  Namenda 5 Mg Tabs (Memantine hcl) .... One by mouth two times a day 7)  Exelon 4.6 Mg/24hr Pt24 (Rivastigmine) .... Apply new patch  daily 8)  Albuterol Sulfate (2.5 Mg/77ml) 0.083% Nebu (Albuterol sulfate) .... Qid as needed for weheezing or sob 9)  Melatonin 3 Mg Tabs (Melatonin) .... Take 1 tab by mouth at bedtime as needed 10)  Pedi-dri 100000 Unit/gm Powd (Nystatin) .... Apply three times a day underneath left  breast 11)  Hydrocodone-acetaminophen 2.5-500 Mg Tabs (Hydrocodone-acetaminophen) .... 1/2 tab by mouth two times a day as needed 12)  Tylenol 325 Mg Tabs (Acetaminophen) .... One tablet by mouth every 6 hours as needed pain 13)  Lisinopril 5 Mg Tabs (Lisinopril) .... Take one tablet by mouth daily 14)  Lipitor 20 Mg Tabs (Atorvastatin calcium) .... Take 1 tablet by mouth once a day 15)  Clotrimazole-betamethasone 1-0.05 % Crea (Clotrimazole-betamethasone) .... Apply to left foot two times a day x 2 weeks 16)  Cefuroxime Axetil 250 Mg Tabs (Cefuroxime axetil) .... One by mouth two times a day  Patient Instructions: 1)  Please schedule a follow-up appointment in 1 month. Prescriptions: CLOTRIMAZOLE-BETAMETHASONE 1-0.05 % CREA (CLOTRIMAZOLE-BETAMETHASONE) apply to left foot two times a day x 2 weeks  #30 grams x 1   Entered and Authorized by:   [name] DO   Signed by:   [name] DO on 08/26/2010   Method used:   Print then Give to Patient   RxID:   [id number]    Orders Added: 1)  T-CBC w/Diff [[id number]] 2)  T-Protime, Auto [[id number]] 3)  Est. Patient Level III [41324]    Current Allergies (reviewed today): ! PERCOCET ! MORPHINE SULFATE (MORPHINE SULFATE) ARICEPT

## 2010-09-16 ENCOUNTER — Encounter: Payer: Self-pay | Admitting: Internal Medicine

## 2010-09-17 ENCOUNTER — Telehealth: Payer: Self-pay | Admitting: Internal Medicine

## 2010-09-20 ENCOUNTER — Encounter: Payer: Self-pay | Admitting: Internal Medicine

## 2010-09-20 ENCOUNTER — Telehealth: Payer: Self-pay | Admitting: Internal Medicine

## 2010-09-23 ENCOUNTER — Encounter: Payer: Self-pay | Admitting: Internal Medicine

## 2010-09-23 NOTE — Medication Information (Signed)
Summary: Prior Authorization for Ondansetron/[facility]  Prior Authorization for Ondansetron/[facility]   Imported By: Lanelle Bal 09/15/2010 08:13:07  _____________________________________________________________________  External Attachment:    Type:   Image     Comment:   External Document

## 2010-09-23 NOTE — Progress Notes (Signed)
Summary: PT/INR-results  Phone Note Other Incoming   Caller: Lab Results -[employer] Summary of Call: PT 17.8  INR 1.69 Initial call taken by: [name],  September 17, 2010 9:51 AM  Follow-up for Phone Call        give pt 6 mg of coumadin (3 mg x 2) x 2 days, then switch to 4 mg daily.  see rx repeat INR in 3 days Follow-up by: [name] DO,  September 17, 2010 1:26 PM  Additional Follow-up for Phone Call Additional follow up Details #1::        call placed to [facility],  at [phone], spoke with Eunice Blase, she has been advised per Dr Artist Pais instructions, rx faxed along with instructions to [phone]. Additional Follow-up by: [name],  September 17, 2010 1:41 PM    New/Updated Medications: COUMADIN 4 MG TABS (WARFARIN SODIUM) one by mouth once daily Prescriptions: COUMADIN 4 MG TABS (WARFARIN SODIUM) one by mouth once daily  #30 x 2   Entered and Authorized by:   [name] DO   Signed by:   [name] DO on 09/17/2010   Method used:   Print then Give to Patient   RxID:   [id number]

## 2010-09-23 NOTE — Medication Information (Signed)
Summary: Approval for Ondansetron/[facility]  Approval for Ondansetron/[facility]   Imported By: Lanelle Bal 09/17/2010 11:09:33  _____________________________________________________________________  External Attachment:    Type:   Image     Comment:   External Document

## 2010-09-23 NOTE — Progress Notes (Signed)
Summary: Coumadin dosing  Phone Note From Other Clinic   Caller: lisa vest from [facility] Call For: darlene Summary of Call: please call lisa vest at [phone]. She needs to know how much coumiden to give to pt. pt has completed antibiotics. pt 19, inr 1.80 and is currently on 2.5 daily. labs are on monday and thursday. Initial call taken by: Elba Barman,  September 14, 2010 11:29 AM  Follow-up for Phone Call        please resume 3 mg of coumadin daily.  repeat INR in 3 days Follow-up by: [name] DO,  September 14, 2010 12:04 PM  Additional Follow-up for Phone Call Additional follow up Details #1::        call returned to [facility] at [phone], she has been advised per Dr Artist Pais instructions, Rx and phone note faxed to [phone] Additional Follow-up by: [name],  September 14, 2010 2:55 PM    New/Updated Medications: COUMADIN 3 MG TABS (WARFARIN SODIUM) one by mouth once daily [BMN] Prescriptions: COUMADIN 3 MG TABS (WARFARIN SODIUM) one by mouth once daily Brand medically necessary #30 x 2   Entered and Authorized by:   [name] DO   Signed by:   [name] DO on 09/14/2010   Method used:   Printed then faxed to ...         RxID:   [id number]

## 2010-09-24 ENCOUNTER — Telehealth: Payer: Self-pay | Admitting: Internal Medicine

## 2010-09-24 ENCOUNTER — Ambulatory Visit: Payer: Medicare Other | Admitting: Vascular Surgery

## 2010-09-27 ENCOUNTER — Telehealth: Payer: Self-pay | Admitting: Internal Medicine

## 2010-09-27 ENCOUNTER — Encounter: Payer: Self-pay | Admitting: Internal Medicine

## 2010-09-29 NOTE — Miscellaneous (Signed)
Summary: [facility]  [facility]   Imported By: Maryln Gottron 09/24/2010 09:57:02  _____________________________________________________________________  External Attachment:    Type:   Image     Comment:   External Document

## 2010-09-29 NOTE — Miscellaneous (Signed)
Summary: Physician's Orders/[facility]  [facility]ician[facility]   Imported By: Maryln Gottron 09/24/2010 09:58:56  _____________________________________________________________________  External Attachment:    Type:   Image     Comment:   External Document

## 2010-09-29 NOTE — Progress Notes (Signed)
Summary: Call A Nurse-Traige report  Phone Note Other Incoming   Caller: Call A Nurse-Traige Report Summary of Call: for Call: [name],RN calling . Pt has a temp of 99.8 . Pt started coughing today, had a little yellow phlegm, was given albuteral tx x1. Pt sounds congested. VS: BP 131/72,P 83, R18, o2 sats 97% RA. Uses o2 at night 2L. Emergent sxs for Cough Protocol R/O Pt will be transported for f/u tomorrow to [facility] or other physician. Protocol(s) Used: Cough - Adult Recommended Outcome per Protocol: See Provider within 24 hours Reason for Outcome: Productive cough with colored sputum (other than clear or white sputum) Care Advice:  ~ Use a cool mist humidifier to moisten air. Be sure to clean according to manufacturer's instructions. Limit or avoid exposure to irritants and allergens (e.g. air pollution, smoke/smoking, chemicals, dust, pollen, pet dander, etc.)  ~ Call provider if fever greater than 101.5 F (38.6 C) or 100.5 F (38.1C) in an immunocompromised patient (such as diabetes, HIV/AIDS, renal disease, chemotherapy, organ transplant, or chronic steroid use) has not improved in 24 hours.  ~ Increase fluids to 8-12 eight oz (1.6 to 2.4 liters) glasses per day, half of them to be water. Soups, popsicles, fruit juices, non-caffeinated sodas (unless restricting sodium intake), jello, broths, decaf teas, etc. are all okay. Warm fluids can be soothing.  ~  ~ If you can, stop smoking now and avoid all secondhand smoke.  ~ HEALTH PROMOTION / MAINTENANCE  ~ SYMPTOM / CONDITION MANAGEMENT  ~ CAUTIONS 09/17/2010 11:06:46PM Page 1 of 1 [id number] Initial call taken by: [name],  September 20, 2010 8:23 AM

## 2010-09-29 NOTE — Progress Notes (Signed)
Summary: INR Results  Phone Note From Other Clinic Call back at [phone]   Caller: [facility] Call For: Dr Artist Pais Summary of Call: [name] with [facility] call to report patient INR was 4.19 yesterday. She states patient Coumadin was held yesterday, and she needed additional orders for any changes. She states patient current dose of Coumadin in 4mg . Initial call taken by: [name],  September 24, 2010 4:21 PM  Follow-up for Phone Call        resume coumadin 3 mg.   repeat INR in 3 days Follow-up by: [name] DO,  September 24, 2010 4:26 PM  Additional Follow-up for Phone Call Additional follow up Details #1::        call returned to [facility] [phone], she was informed per Dr Artist Pais instructions. Rx faxed with written instructions to [facility] at [phone] Additional Follow-up by: [name],  September 24, 2010 4:35 PM    New/Updated Medications: COUMADIN 3 MG TABS (WARFARIN SODIUM) one by mouth once daily Prescriptions: COUMADIN 3 MG TABS (WARFARIN SODIUM) one by mouth once daily  #30 x 2   Entered and Authorized by:   [name] DO   Signed by:   [name] DO on 09/24/2010   Method used:   Print then Give to Patient   RxID:   [id number]

## 2010-09-29 NOTE — Miscellaneous (Signed)
Summary: Physician's Orders/[facility]  Physician's Orders/[facility]   Imported By: Maryln Gottron 09/23/2010 14:26:19  _____________________________________________________________________  External Attachment:    Type:   Image     Comment:   External Document

## 2010-09-30 ENCOUNTER — Encounter: Payer: Self-pay | Admitting: Internal Medicine

## 2010-09-30 ENCOUNTER — Ambulatory Visit (INDEPENDENT_AMBULATORY_CARE_PROVIDER_SITE_OTHER): Payer: Medicare Other | Admitting: Internal Medicine

## 2010-09-30 DIAGNOSIS — L03119 Cellulitis of unspecified part of limb: Secondary | ICD-10-CM

## 2010-09-30 DIAGNOSIS — I739 Peripheral vascular disease, unspecified: Secondary | ICD-10-CM

## 2010-09-30 DIAGNOSIS — I4891 Unspecified atrial fibrillation: Secondary | ICD-10-CM

## 2010-10-01 ENCOUNTER — Ambulatory Visit (INDEPENDENT_AMBULATORY_CARE_PROVIDER_SITE_OTHER): Payer: Medicare Other | Admitting: Vascular Surgery

## 2010-10-01 ENCOUNTER — Encounter: Payer: Self-pay | Admitting: Internal Medicine

## 2010-10-01 ENCOUNTER — Encounter (INDEPENDENT_AMBULATORY_CARE_PROVIDER_SITE_OTHER): Payer: Medicare Other

## 2010-10-01 DIAGNOSIS — I739 Peripheral vascular disease, unspecified: Secondary | ICD-10-CM

## 2010-10-01 DIAGNOSIS — I7092 Chronic total occlusion of artery of the extremities: Secondary | ICD-10-CM

## 2010-10-01 NOTE — Assessment & Plan Note (Signed)
OFFICE VISIT  Rita Miller, Rita Miller DOB:  [birth year]                                       10/01/2010 ZOXWR#:[id number]  This is an established patient followup.  HISTORY OF PRESENT ILLNESS:  This is a 77 year old female well-known to me for severe aortic stenosis that is not amenable to apparently surgical intervention given her multiple comorbidities.  I had been seeing her previously for left greater than right peripheral arterial disease.  At this point the patient is completely unchanged in terms of her symptomatology.  She does not ambulate for any extended period, rather mobilizes through a wheelchair primarily at the nursing home. She denies any drainage from her left foot or any fevers or chills or any frank symptoms of rest pain.  She has some degree of dementia so the interview is of somewhat limited value.  Her past medical history, past surgical history, social history, family history, review of systems and medications and allergies are completely unchanged from her previous visits.  PHYSICAL EXAMINATION:  Today she came in with a temperature 97.4, blood pressure 130/70, heart rate 74, respirations were 12.  On focused examination of the left leg she has a palpable femoral pulse.  No palpable popliteal or pedal pulses.  The left foot is mottled but I do not see any frank ulcerations or any frank evidence of gangrene.  She claims to have intact sensation in this left foot.  She is also able to plantar flex and dorsiflex this foot.  The plantar flexion is about 4/5, the dorsiflexion however is only 3/5.  NONINVASIVE VASCULAR IMAGING:  She had bilateral ABIs completed.  On the right side she has an ABI of 0.84, on the left 0.66. The waveforms on the left side are monophasic.  On the right side she has triphasic dorsalis pedis and posterior tibial.  MEDICAL DECISION MAKING:  This is a 77 year old female with known distal SFA above knee popliteal  peripheral arterial disease.  From a previous interventional cardiology intervention it appears that there was some type of disruption in the inline flow with a stent and subsequently there is no opportunity of endovascular recanalization on this left side.  Currently her ABIs have in fact increased to a 0.66 from a previous of 0.38.  While she does have ischemic changes in the left foot this is not to my recollection significantly different from previously when I saw her about 3 months ago.  At this point the patient is still adamant that she does not want any amputation so subsequently I think at this point with the patient being relatively asymptomatic, no frank gangrene or ulceration and the fact that she does not ambulate any significant distance that we just continue with maximal medical management to protect the left foot for any injury and my suspicion is that her cardiac problem will cause her more problem than her left foot pain in both the short- and long-term.  I am going to repeat her studies in 6 months to keep an eye on her and will check again on her in 6 months.  Thank you for allowing [place] to participate in this patient's care.    Fransisco Hertz, MD Electronically Signed  BLC/MEDQ  D:  10/01/2010  T:  10/01/2010  Job:  [id number]  cc:   Dr Daria Pastures

## 2010-10-04 ENCOUNTER — Telehealth: Payer: Self-pay | Admitting: Internal Medicine

## 2010-10-05 ENCOUNTER — Encounter: Payer: Self-pay | Admitting: Internal Medicine

## 2010-10-05 ENCOUNTER — Telehealth: Payer: Self-pay | Admitting: Internal Medicine

## 2010-10-05 NOTE — Miscellaneous (Signed)
Summary: Lab results & Physician's Orders/[facility]  Lab results & [facility]ician's Orders/[facility]   Imported By: Maryln Gottron 09/30/2010 15:43:43  _____________________________________________________________________  External Attachment:    Type:   Image     Comment:   External Document

## 2010-10-05 NOTE — Progress Notes (Signed)
Summary: Lab Results  Phone Note Other Incoming Call back at [facility] Phone [phone] Call back at [phone]   Caller: [facility] Summary of Call: Tanya from [facility] called and left voice message stating patients PT/INr was 2.60, she is currently taking Coumadin 3mg . Initial call taken by: [name],  September 27, 2010 5:05 PM  Follow-up for Phone Call        continue same dose - 3 mg.   repeat INR in 1 week Follow-up by: [name] DO,  September 27, 2010 5:11 PM  Additional Follow-up for Phone Call Additional follow up Details #1::        Call returned to [facility] with [facility], she was not available; spoke with [place], she was informed per Dr Artist Pais instructions Additional Follow-up by: [name],  September 27, 2010 5:18 PM

## 2010-10-07 ENCOUNTER — Encounter: Payer: Self-pay | Admitting: Internal Medicine

## 2010-10-07 ENCOUNTER — Telehealth: Payer: Self-pay | Admitting: Internal Medicine

## 2010-10-08 ENCOUNTER — Encounter: Payer: Self-pay | Admitting: Internal Medicine

## 2010-10-09 ENCOUNTER — Encounter: Payer: Self-pay | Admitting: Internal Medicine

## 2010-10-14 NOTE — Progress Notes (Signed)
Summary: INR, thyroid dose  Phone Note From Other Clinic Call back at [phone]   Caller: Nurse Request: Talk with Nurse Summary of Call: Angel from [facility] called and said that phlebomotist did not get enough blood yesterday. could they do another INR on Thursday? please assist. Initial call taken by: Elba Barman,  October 05, 2010 12:00 PM  Follow-up for Phone Call        ok to check INR on thursday Follow-up by: [name] DO,  October 05, 2010 1:30 PM  Additional Follow-up for Phone Call Additional follow up Details #1::        also inform pt, recent TSH was elevated at 12.45.  please confirm pt has been taking her thyroid medication regularly has she change the way pt is taking medication - in AM , is she taking with vitamins or with food Additional Follow-up by: [name] DO,  October 05, 2010 1:33 PM    Additional Follow-up for Phone Call Additional follow up Details #2::    Notified Lawanna Kobus per Dr Olegario Messier instruction. She states that pt takes Synthroid 1 tablet daily per hospital order of 08/05/10 for 60 days. Order will expire on 10/06/10. We are showing daily in EMR. Pt is getting med at 6am without food or other medications. Please advise.  Nicki [place] Fergerson CMA Duncan Dull),  October 05, 2010 2:53 PM  please instruct staff to increase synthroid dose to 88 micrograms.  arrange repeat TSH in 2 months  [name] DO,  October 06, 2010 9:11 AM  Additional Follow-up for Phone Call Additional follow up Details #3:: Details for Additional Follow-up Action Taken: Spoke to [place] and notified her per Dr Olegario Messier instruction. Will fax rx and lab order to [phone]. Rosey Bath states they will attempt blood draw at their facility and fax result to [place]. Nicki [place] Fergerson CMA (AAMA)  October 06, 2010 9:22 AM   Prescriptions: SYNTHROID 88 MCG TABS (LEVOTHYROXINE SODIUM) one by mouth once daily  #30 x 5   Entered and Authorized by:   [name] DO   Signed by:   D.  Thomos Lemons DO on 10/06/2010   Method used:   Printed then faxed to ...         RxID:   [id number]

## 2010-10-14 NOTE — Progress Notes (Signed)
Summary: Report of Consultation  Report of Consultation   Imported By: Maryln Gottron 10/06/2010 15:15:05  _____________________________________________________________________  External Attachment:    Type:   Image     Comment:   External Document

## 2010-10-14 NOTE — Progress Notes (Signed)
Summary: Yeast Under Right Breast  Phone Note From Other Clinic Call back at [phone]   Caller: Jolene Provost- [facility] Summary of Call: Aggie Cosier with [name] called and left voice message stating patient has yeast under her right breast. She states they have standing orders at the facility to treat. Her message states the order is for Lotrimin  two times a day under left breast until healed. She states the Lotrim was applied to patients breast earleir, and she would like to know if Dr Artist Pais is ok with continuing the order Initial call taken by: [name],  October 04, 2010 3:59 PM  Follow-up for Phone Call        call returned to [place] at [phone], she was not available; spoke with Eunice Blase she was advised per Dr Artist Pais okay the standing order for Lotrimin was approved Follow-up by: [name],  October 05, 2010 9:12 AM

## 2010-10-14 NOTE — Progress Notes (Signed)
Summary: INR Results  Phone Note Other Incoming Call back at [phone]   Caller: [name] - [name] Summary of Call: [name] from [facility] called and left voice message stating patient INR was 3.77, they have placed her Coumadin on hold. She is currently at 3mg . They would like to know what Dr Artist Pais advises. Initial call taken by: [name],  October 07, 2010 4:26 PM  Follow-up for Phone Call        hold coumadin additional 24 hrs, then restart at 3 mg.  repeat INR in 1 week please remind pt not to significantly change intake of  green leafy vegetables Follow-up by: [name] DO,  October 07, 2010 4:34 PM  Additional Follow-up for Phone Call Additional follow up Details #1::        information  faxed to [phone], call placed to [facility] at [phone], she has been advised per Dr Artist Pais instructions.  Additional Follow-up by: [name],  October 07, 2010 4:43 PM

## 2010-10-18 ENCOUNTER — Telehealth: Payer: Self-pay | Admitting: Internal Medicine

## 2010-10-18 LAB — COMPREHENSIVE METABOLIC PANEL
ALT: 11 U/L (ref 0–35)
ALT: 11 U/L (ref 0–35)
ALT: 19 U/L (ref 0–35)
AST: 20 U/L (ref 0–37)
AST: 23 U/L (ref 0–37)
Albumin: 2.5 g/dL — ABNORMAL LOW (ref 3.5–5.2)
Albumin: 3.4 g/dL — ABNORMAL LOW (ref 3.5–5.2)
Alkaline Phosphatase: 97 U/L (ref 39–117)
BUN: 14 mg/dL (ref 6–23)
Calcium: 7.8 mg/dL — ABNORMAL LOW (ref 8.4–10.5)
Chloride: 106 mEq/L (ref 96–112)
Creatinine, Ser: 0.99 mg/dL (ref 0.4–1.2)
GFR calc Af Amer: >60 mL/min (ref >60)
GFR calc Af Amer: >60 mL/min (ref >60)
GFR calc Af Amer: >60 mL/min (ref >60)
Potassium: 3.5 mEq/L (ref 3.5–5.1)
Potassium: 4.5 mEq/L (ref 3.5–5.1)
Sodium: 133 mEq/L — ABNORMAL LOW (ref 135–145)
Sodium: 134 mEq/L — ABNORMAL LOW (ref 135–145)
Sodium: 135 mEq/L (ref 135–145)
Total Bilirubin: 1.3 mg/dL — ABNORMAL HIGH (ref 0.3–1.2)
Total Protein: 6 g/dL (ref 6.0–8.3)
Total Protein: 7.2 g/dL (ref 6.0–8.3)

## 2010-10-18 LAB — CBC
Hemoglobin: 10.2 g/dL — ABNORMAL LOW (ref 12.0–15.0)
Hemoglobin: 8.9 g/dL — ABNORMAL LOW (ref 12.0–15.0)
Hemoglobin: 9.1 g/dL — ABNORMAL LOW (ref 12.0–15.0)
MCH: 26.5 pg (ref 26.0–34.0)
MCHC: 32.6 g/dL (ref 30.0–36.0)
MCV: 81 fL (ref 78.0–100.0)
Platelets: 168 10*3/uL (ref 150–400)
Platelets: 229 10*3/uL (ref 150–400)
RBC: 3.43 MIL/uL — ABNORMAL LOW (ref 3.87–5.11)
RBC: 3.98 MIL/uL (ref 3.87–5.11)
RDW: 23.1 % — ABNORMAL HIGH (ref 11.5–15.5)
RDW: 23.7 % — ABNORMAL HIGH (ref 11.5–15.5)
WBC: 14.5 10*3/uL — ABNORMAL HIGH (ref 4.0–10.5)
WBC: 15.4 10*3/uL — ABNORMAL HIGH (ref 4.0–10.5)

## 2010-10-18 LAB — URINE CULTURE
Colony Count: NO GROWTH
Culture  Setup Time: 201112270043
Culture: NO GROWTH

## 2010-10-18 LAB — PROTIME-INR
INR: 2.14 — ABNORMAL HIGH (ref 0.00–1.49)
INR: 2.88 — ABNORMAL HIGH (ref 0.00–1.49)
Prothrombin Time: 30.2 seconds — ABNORMAL HIGH (ref 11.6–15.2)

## 2010-10-18 LAB — BASIC METABOLIC PANEL
CO2: 22 mEq/L (ref 19–32)
Calcium: 8 mg/dL — ABNORMAL LOW (ref 8.4–10.5)
Chloride: 109 mEq/L (ref 96–112)
GFR calc Af Amer: >60 mL/min (ref >60)
Sodium: 135 mEq/L (ref 135–145)

## 2010-10-18 LAB — DIFFERENTIAL
Basophils Absolute: 0 10*3/uL (ref 0.0–0.1)
Basophils Relative: 0 % (ref 0–1)
Basophils Relative: 0 % (ref 0–1)
Eosinophils Absolute: 0 10*3/uL (ref 0.0–0.7)
Eosinophils Relative: 1 % (ref 0–5)
Lymphocytes Relative: 4 % — ABNORMAL LOW (ref 12–46)
Lymphs Abs: 1 10*3/uL (ref 0.7–4.0)
Lymphs Abs: 1.2 10*3/uL (ref 0.7–4.0)
Monocytes Absolute: 1.1 10*3/uL — ABNORMAL HIGH (ref 0.1–1.0)
Monocytes Absolute: 1.2 10*3/uL — ABNORMAL HIGH (ref 0.1–1.0)
Monocytes Relative: 8 % (ref 3–12)
Neutro Abs: 12.7 10*3/uL — ABNORMAL HIGH (ref 1.7–7.7)
Neutro Abs: 13 10*3/uL — ABNORMAL HIGH (ref 1.7–7.7)

## 2010-10-18 LAB — INFLUENZA PANEL BY PCR (TYPE A & B)
Influenza A By PCR: NEGATIVE
Influenza B By PCR: NEGATIVE

## 2010-10-18 LAB — TSH: TSH: 0.346 u[IU]/mL — ABNORMAL LOW (ref 0.350–4.500)

## 2010-10-18 LAB — BRAIN NATRIURETIC PEPTIDE: Pro B Natriuretic peptide (BNP): 760 pg/mL — ABNORMAL HIGH (ref 0.0–100.0)

## 2010-10-18 LAB — CULTURE, BLOOD (ROUTINE X 2)
Culture  Setup Time: 201112270137
Culture: NO GROWTH
Culture: NO GROWTH

## 2010-10-18 LAB — POCT CARDIAC MARKERS: Myoglobin, poc: 79.3 ng/mL (ref 12–200)

## 2010-10-18 LAB — URINALYSIS, ROUTINE W REFLEX MICROSCOPIC
Bilirubin Urine: NEGATIVE
Glucose, UA: NEGATIVE mg/dL
Hgb urine dipstick: NEGATIVE
Ketones, ur: NEGATIVE mg/dL
Protein, ur: 100 mg/dL — AB
Urobilinogen, UA: 1 mg/dL (ref 0.0–1.0)

## 2010-10-18 LAB — CK TOTAL AND CKMB (NOT AT ARMC)
CK, MB: 1 ng/mL (ref 0.3–4.0)
Relative Index: INVALID (ref 0.0–2.5)
Total CK: 27 U/L (ref 7–177)

## 2010-10-18 LAB — APTT
aPTT: 54 seconds — ABNORMAL HIGH (ref 24–37)
aPTT: 63 seconds — ABNORMAL HIGH (ref 24–37)

## 2010-10-18 LAB — MRSA PCR SCREENING: MRSA by PCR: NEGATIVE

## 2010-10-19 LAB — BRAIN NATRIURETIC PEPTIDE: Pro B Natriuretic peptide (BNP): 259 pg/mL — ABNORMAL HIGH (ref 0.0–100.0)

## 2010-10-19 LAB — DIFFERENTIAL
Lymphocytes Relative: 24 % (ref 12–46)
Lymphs Abs: 1.6 10*3/uL (ref 0.7–4.0)
Monocytes Absolute: 0.6 10*3/uL (ref 0.1–1.0)
Monocytes Relative: 9 % (ref 3–12)
Neutro Abs: 4.2 10*3/uL (ref 1.7–7.7)

## 2010-10-19 LAB — URINALYSIS, ROUTINE W REFLEX MICROSCOPIC
Leukocytes, UA: NEGATIVE
Nitrite: NEGATIVE
Specific Gravity, Urine: 1.018 (ref 1.005–1.030)
Urobilinogen, UA: 1 mg/dL (ref 0.0–1.0)
pH: 6 (ref 5.0–8.0)

## 2010-10-19 LAB — COMPREHENSIVE METABOLIC PANEL
Albumin: 3.1 g/dL — ABNORMAL LOW (ref 3.5–5.2)
Alkaline Phosphatase: 98 U/L (ref 39–117)
BUN: 28 mg/dL — ABNORMAL HIGH (ref 6–23)
Chloride: 109 mEq/L (ref 96–112)
Creatinine, Ser: 1.16 mg/dL (ref 0.4–1.2)
Glucose, Bld: 192 mg/dL — ABNORMAL HIGH (ref 70–99)
Potassium: 4.3 mEq/L (ref 3.5–5.1)
Total Bilirubin: 0.8 mg/dL (ref 0.3–1.2)

## 2010-10-19 LAB — CBC
HCT: 21.2 % — ABNORMAL LOW (ref 36.0–46.0)
HCT: 22 % — ABNORMAL LOW (ref 36.0–46.0)
HCT: 29.9 % — ABNORMAL LOW (ref 36.0–46.0)
Hemoglobin: 10 g/dL — ABNORMAL LOW (ref 12.0–15.0)
Hemoglobin: 7.1 g/dL — ABNORMAL LOW (ref 12.0–15.0)
Hemoglobin: 9.8 g/dL — ABNORMAL LOW (ref 12.0–15.0)
MCH: 25.7 pg — ABNORMAL LOW (ref 26.0–34.0)
MCH: 27.8 pg (ref 26.0–34.0)
MCH: 27.9 pg (ref 26.0–34.0)
MCHC: 32.1 g/dL (ref 30.0–36.0)
MCHC: 33.4 g/dL (ref 30.0–36.0)
MCV: 79.6 fL (ref 78.0–100.0)
MCV: 83.2 fL (ref 78.0–100.0)
Platelets: 364 10*3/uL (ref 150–400)
RBC: 2.67 MIL/uL — ABNORMAL LOW (ref 3.87–5.11)
RBC: 2.75 MIL/uL — ABNORMAL LOW (ref 3.87–5.11)
RBC: 3.52 MIL/uL — ABNORMAL LOW (ref 3.87–5.11)
RBC: 3.59 MIL/uL — ABNORMAL LOW (ref 3.87–5.11)
RDW: 18.3 % — ABNORMAL HIGH (ref 11.5–15.5)
WBC: 6.7 10*3/uL (ref 4.0–10.5)
WBC: 7.8 10*3/uL (ref 4.0–10.5)

## 2010-10-19 LAB — BASIC METABOLIC PANEL
CO2: 20 mEq/L (ref 19–32)
Calcium: 8.2 mg/dL — ABNORMAL LOW (ref 8.4–10.5)
Chloride: 108 mEq/L (ref 96–112)
GFR calc Af Amer: 49 mL/min — ABNORMAL LOW (ref >60)
GFR calc Af Amer: 59 mL/min — ABNORMAL LOW (ref >60)
GFR calc non Af Amer: 49 mL/min — ABNORMAL LOW (ref >60)
Glucose, Bld: 70 mg/dL (ref 70–99)
Potassium: 4.3 mEq/L (ref 3.5–5.1)
Sodium: 138 mEq/L (ref 135–145)
Sodium: 140 mEq/L (ref 135–145)

## 2010-10-19 LAB — PROTIME-INR
INR: 2.05 — ABNORMAL HIGH (ref 0.00–1.49)
INR: 3.14 — ABNORMAL HIGH (ref 0.00–1.49)
INR: 3.24 — ABNORMAL HIGH (ref 0.00–1.49)
Prothrombin Time: 25 seconds — ABNORMAL HIGH (ref 11.6–15.2)
Prothrombin Time: 32.3 seconds — ABNORMAL HIGH (ref 11.6–15.2)
Prothrombin Time: 33.1 seconds — ABNORMAL HIGH (ref 11.6–15.2)
Prothrombin Time: 34.8 seconds — ABNORMAL HIGH (ref 11.6–15.2)

## 2010-10-19 LAB — CROSSMATCH
ABO/RH(D): AB POS
Unit division: 0

## 2010-10-19 LAB — POCT CARDIAC MARKERS
CKMB, poc: 2.4 ng/mL (ref 1.0–8.0)
Myoglobin, poc: 96.9 ng/mL (ref 12–200)

## 2010-10-19 LAB — URINE CULTURE: Culture: NO GROWTH

## 2010-10-19 LAB — URINE MICROSCOPIC-ADD ON

## 2010-10-19 LAB — APTT: aPTT: 50 seconds — ABNORMAL HIGH (ref 24–37)

## 2010-10-19 NOTE — Assessment & Plan Note (Signed)
Summary: 2 mth f/u mhf--coming in at 215   Vital Signs:  Patient profile:   77 year old female Height:      60 inches Weight:      106.75 pounds BMI:     20.92 O2 Sat:      98 % on Room air Temp:     97.7 degrees F oral Pulse rate:   81 / minute Resp:     18 per minute BP sitting:   100 / 54  (left arm) Cuff size:   regular  Vitals Entered By: [name] (September 30, 2010 1:47 PM)  O2 Flow:  Room air CC: 2 Month Follow up Is Patient Diabetic? No Comments c/o difficulty walking with walker,    Primary Care Provider:  [name] DO  CC:  2 Month Follow up.  History of Present Illness: 77 y/o female with severe AS and PAD for follow up left foot pain - unchanged no drainage reviewed consultant notes ABI improved on left side    Allergies: 1)  ! Percocet 2)  ! Morphine Sulfate (Morphine Sulfate) 3)  Aricept  Past History:  Past Medical History: CORONARY ARTERY DISEASE (ICD-414.00) CEREBROVASCULAR ACCIDENT, HX OF (ICD-V12.50) ATRIAL FIBRILLATION (ICD-427.31)   HYPERLIPIDEMIA (ICD-272.4)    COPD (ICD-496)          EPISTAXIS, RECURRENT (ICD-784.7)    OSTEOPOROSIS (ICD-733.00)   HYPOTHYROIDISM (ICD-244.9)   PULMONARY EMBOLISM, HX OF (ICD-V12.51) DIVERTICULITIS, HX OF (ICD-V12.79) H/O peptic ulcer disease CERVICAL CANCER, HX OF (ICD-V10.41) BREAST CANCER, HX OF (ICD-V10.3) ASTHMA (ICD-493.90) ANEMIA-IRON DEFICIENCY (ICD-280.9) Blood transfusion  Hx of DVT Hx LBBB Severe MR; MV repair 7/10 Severe Aortic Stenosis Bradycardia, s/p pacemaker implant Severe PAD - left leg with occluded Viabahn stents x 3 left SFA.  Past Surgical History: H/O thyroidectomy due to cancer Total hip replacement Hysterectomy    Mastectomy       Mitral valve repair     Left Eye Implant      S/P Pacemaker      Family History: Family History Breast cancer 1st degree relative <50 Family History of Colon CA 1st degree relative <60 Family History Diabetes 1st degree  relative Family History High cholesterol Family History Hypertension   Mother with unknown heart disease                    Social History: Retired Widow  1 daughter    Tobacco Use - Former.     Alcohol Use - no                Review of Systems  The patient denies chest pain.         no worsening cough or increased sputum production  Physical Exam  General:  alert.   Lungs:  normal respiratory effort and normal breath sounds.   Heart:  normal rate and regular rhythm.  SEM II/VI RSB Extremities:  No lower extremity edema    Impression & Recommendations:  Problem # 1:  CELLULITIS, FOOT (ICD-682.7) Assessment Unchanged continue to keep area dry and clean  The following medications were removed from the medication list:    Cefuroxime Axetil 250 Mg Tabs (Cefuroxime axetil) ..... One by mouth two times a day  Problem # 2:  PVD (ICD-443.9) Assessment: Improved continue aggressive medical therapy I encouraged regular exercise  Problem # 3:  ATRIAL FIBRILLATION (ICD-427.31)  Her updated medication list for this problem includes:    Coumadin 3 Mg Tabs (  Warfarin sodium) ..... One by mouth once daily    Cartia Xt 240 Mg Xr24h-cap (Diltiazem hcl coated beads) ..... One by mouth once daily    Aspirin 81 Mg Tbec (Aspirin) ..... One by mouth every day  Reviewed the following: PT: 26.7 (08/26/2010)   INR: 2.6 ratio (08/26/2010) Coumadin Dose (weekly): 28 mg (12/25/2009) Prior Coumadin Dose (weekly): 28 mg (12/25/2009) Next Protime: 08/12/2009 (dated on 08/05/2009)  Complete Medication List: 1)  Coumadin 3 Mg Tabs (Warfarin sodium) .... One by mouth once daily 2)  Cartia Xt 240 Mg Xr24h-cap (Diltiazem hcl coated beads) .... One by mouth once daily 3)  Synthroid 88 Mcg Tabs (Levothyroxine sodium) .... One by mouth once daily 4)  Flovent Hfa 44 Mcg/act Aero (Fluticasone propionate  hfa) .... 2 puffs by mouth once daily 5)  Aspirin 81 Mg Tbec (Aspirin) .... One by mouth every  day 6)  Namenda 5 Mg Tabs (Memantine hcl) .... One by mouth two times a day 7)  Exelon 4.6 Mg/24hr Pt24 (Rivastigmine) .... Apply new patch  daily 8)  Albuterol Sulfate (2.5 Mg/16ml) 0.083% Nebu (Albuterol sulfate) .... Qid as needed for weheezing or sob 9)  Melatonin 3 Mg Tabs (Melatonin) .... Take 1 tab by mouth at bedtime as needed 10)  Pedi-dri 100000 Unit/gm Powd (Nystatin) .... Apply three times a day underneath left breast 11)  Hydrocodone-acetaminophen 2.5-500 Mg Tabs (Hydrocodone-acetaminophen) .... 1/2 tab by mouth two times a day as needed 12)  Tylenol 325 Mg Tabs (Acetaminophen) .... One tablet by mouth every 6 hours as needed pain 13)  Lisinopril 5 Mg Tabs (Lisinopril) .... Take one tablet by mouth daily 14)  Lipitor 20 Mg Tabs (Atorvastatin calcium) .... Take 1 tablet by mouth once a day 15)  Zofran 8 Mg Tabs (Ondansetron hcl) .... One tablet by mouth every 4-6 hours as needed   Orders Added: 1)  Est. Patient Level III [60454]    Current Allergies (reviewed today): ! PERCOCET ! MORPHINE SULFATE (MORPHINE SULFATE) ARICEPT

## 2010-10-19 NOTE — Letter (Signed)
Summary: [facility]  [facility]   Imported By: Maryln Gottron 10/15/2010 15:50:10  _____________________________________________________________________  External Attachment:    Type:   Image     Comment:   External Document

## 2010-10-19 NOTE — Miscellaneous (Signed)
Summary: [facility] Order/[place]  [facility] Order/[place]   Imported By: Lanelle Bal 10/14/2010 08:51:10  _____________________________________________________________________  External Attachment:    Type:   Image     Comment:   External Document

## 2010-10-21 LAB — CARDIAC PANEL(CRET KIN+CKTOT+MB+TROPI)
CK, MB: 2.6 ng/mL (ref 0.3–4.0)
Relative Index: INVALID (ref 0.0–2.5)
Total CK: 53 U/L (ref 7–177)
Troponin I: 0.01 ng/mL (ref 0.00–0.06)

## 2010-10-21 LAB — POCT CARDIAC MARKERS
CKMB, poc: 1.1 ng/mL (ref 1.0–8.0)
Troponin i, poc: <0.05 ng/mL (ref 0.00–0.09)
Troponin i, poc: <0.05 ng/mL (ref 0.00–0.09)

## 2010-10-21 LAB — DIFFERENTIAL
Eosinophils Absolute: 0.3 10*3/uL (ref 0.0–0.7)
Lymphs Abs: 1.6 10*3/uL (ref 0.7–4.0)
Monocytes Relative: 11 % (ref 3–12)
Neutro Abs: 3.8 10*3/uL (ref 1.7–7.7)
Neutrophils Relative %: 58 % (ref 43–77)

## 2010-10-21 LAB — URINALYSIS, ROUTINE W REFLEX MICROSCOPIC
Bilirubin Urine: NEGATIVE
Hgb urine dipstick: NEGATIVE
Ketones, ur: NEGATIVE mg/dL
Nitrite: NEGATIVE
Specific Gravity, Urine: 1.017 (ref 1.005–1.030)
Urobilinogen, UA: 1 mg/dL (ref 0.0–1.0)
pH: 6.5 (ref 5.0–8.0)

## 2010-10-21 LAB — BASIC METABOLIC PANEL
BUN: 28 mg/dL — ABNORMAL HIGH (ref 6–23)
CO2: 23 mEq/L (ref 19–32)
Chloride: 107 mEq/L (ref 96–112)
Glucose, Bld: 118 mg/dL — ABNORMAL HIGH (ref 70–99)
Potassium: 4.6 mEq/L (ref 3.5–5.1)
Sodium: 140 mEq/L (ref 135–145)

## 2010-10-21 LAB — CBC
HCT: 30.7 % — ABNORMAL LOW (ref 36.0–46.0)
Hemoglobin: 10 g/dL — ABNORMAL LOW (ref 12.0–15.0)
MCH: 30.1 pg (ref 26.0–34.0)
MCHC: 32.6 g/dL (ref 30.0–36.0)
MCV: 92.5 fL (ref 78.0–100.0)
RDW: 14.8 % (ref 11.5–15.5)

## 2010-10-21 LAB — URINE MICROSCOPIC-ADD ON

## 2010-10-21 LAB — COMPREHENSIVE METABOLIC PANEL
BUN: 30 mg/dL — ABNORMAL HIGH (ref 6–23)
CO2: 24 mEq/L (ref 19–32)
Calcium: 9 mg/dL (ref 8.4–10.5)
Creatinine, Ser: 1.33 mg/dL — ABNORMAL HIGH (ref 0.4–1.2)
GFR calc non Af Amer: 39 mL/min — ABNORMAL LOW (ref >60)
Glucose, Bld: 101 mg/dL — ABNORMAL HIGH (ref 70–99)
Sodium: 137 mEq/L (ref 135–145)
Total Protein: 6.7 g/dL (ref 6.0–8.3)

## 2010-10-21 LAB — PROTIME-INR
INR: 2.72 — ABNORMAL HIGH (ref 0.00–1.49)
INR: 2.96 — ABNORMAL HIGH (ref 0.00–1.49)
Prothrombin Time: 28.9 seconds — ABNORMAL HIGH (ref 11.6–15.2)

## 2010-10-24 LAB — GLUCOSE, CAPILLARY
Glucose-Capillary: 105 mg/dL — ABNORMAL HIGH (ref 70–99)
Glucose-Capillary: 70 mg/dL (ref 70–99)
Glucose-Capillary: 79 mg/dL (ref 70–99)
Glucose-Capillary: 80 mg/dL (ref 70–99)
Glucose-Capillary: 83 mg/dL (ref 70–99)
Glucose-Capillary: 88 mg/dL (ref 70–99)
Glucose-Capillary: 91 mg/dL (ref 70–99)
Glucose-Capillary: 99 mg/dL (ref 70–99)

## 2010-10-24 LAB — CBC
HCT: 29.2 % — ABNORMAL LOW (ref 36.0–46.0)
Hemoglobin: 10.5 g/dL — ABNORMAL LOW (ref 12.0–15.0)
Hemoglobin: 10.8 g/dL — ABNORMAL LOW (ref 12.0–15.0)
Hemoglobin: 11 g/dL — ABNORMAL LOW (ref 12.0–15.0)
Hemoglobin: 9.8 g/dL — ABNORMAL LOW (ref 12.0–15.0)
MCH: 31.2 pg (ref 26.0–34.0)
MCH: 31.5 pg (ref 26.0–34.0)
MCH: 31.7 pg (ref 26.0–34.0)
MCH: 32 pg (ref 26.0–34.0)
MCHC: 33.4 g/dL (ref 30.0–36.0)
MCHC: 33.5 g/dL (ref 30.0–36.0)
MCV: 94.5 fL (ref 78.0–100.0)
Platelets: 224 10*3/uL (ref 150–400)
Platelets: 231 10*3/uL (ref 150–400)
Platelets: 232 10*3/uL (ref 150–400)
RBC: 3.37 MIL/uL — ABNORMAL LOW (ref 3.87–5.11)
RBC: 3.4 MIL/uL — ABNORMAL LOW (ref 3.87–5.11)
RBC: 3.44 MIL/uL — ABNORMAL LOW (ref 3.87–5.11)
WBC: 6.5 10*3/uL (ref 4.0–10.5)
WBC: 7 10*3/uL (ref 4.0–10.5)
WBC: 7.1 10*3/uL (ref 4.0–10.5)

## 2010-10-24 LAB — IRON AND TIBC
Saturation Ratios: 13 % — ABNORMAL LOW (ref 20–55)
UIBC: 224 ug/dL

## 2010-10-24 LAB — DIFFERENTIAL
Eosinophils Absolute: 0.2 10*3/uL (ref 0.0–0.7)
Lymphocytes Relative: 21 % (ref 12–46)
Lymphs Abs: 1.5 10*3/uL (ref 0.7–4.0)
Monocytes Relative: 9 % (ref 3–12)
Neutro Abs: 4.7 10*3/uL (ref 1.7–7.7)
Neutrophils Relative %: 66 % (ref 43–77)

## 2010-10-24 LAB — COMPREHENSIVE METABOLIC PANEL
ALT: 18 U/L (ref 0–35)
AST: 24 U/L (ref 0–37)
Albumin: 3.4 g/dL — ABNORMAL LOW (ref 3.5–5.2)
Chloride: 109 mEq/L (ref 96–112)
Creatinine, Ser: 1.1 mg/dL (ref 0.4–1.2)
GFR calc Af Amer: 58 mL/min — ABNORMAL LOW (ref >60)
Potassium: 4.7 mEq/L (ref 3.5–5.1)
Sodium: 139 mEq/L (ref 135–145)
Total Bilirubin: 0.4 mg/dL (ref 0.3–1.2)

## 2010-10-24 LAB — APTT: aPTT: 38 seconds — ABNORMAL HIGH (ref 24–37)

## 2010-10-24 LAB — BASIC METABOLIC PANEL
BUN: 23 mg/dL (ref 6–23)
CO2: 23 mEq/L (ref 19–32)
CO2: 28 mEq/L (ref 19–32)
Calcium: 8.7 mg/dL (ref 8.4–10.5)
Chloride: 105 mEq/L (ref 96–112)
Creatinine, Ser: 1.25 mg/dL — ABNORMAL HIGH (ref 0.4–1.2)
GFR calc Af Amer: 58 mL/min — ABNORMAL LOW (ref >60)
Glucose, Bld: 86 mg/dL (ref 70–99)
Potassium: 4.4 mEq/L (ref 3.5–5.1)
Sodium: 139 mEq/L (ref 135–145)

## 2010-10-24 LAB — PROTIME-INR
INR: 1.9 — ABNORMAL HIGH (ref 0.00–1.49)
INR: 2.45 — ABNORMAL HIGH (ref 0.00–1.49)
Prothrombin Time: 21.6 seconds — ABNORMAL HIGH (ref 11.6–15.2)
Prothrombin Time: 26.3 seconds — ABNORMAL HIGH (ref 11.6–15.2)
Prothrombin Time: 26.4 seconds — ABNORMAL HIGH (ref 11.6–15.2)

## 2010-10-24 LAB — CARDIAC PANEL(CRET KIN+CKTOT+MB+TROPI)
CK, MB: 1.8 ng/mL (ref 0.3–4.0)
CK, MB: 2.3 ng/mL (ref 0.3–4.0)
Relative Index: INVALID (ref 0.0–2.5)
Relative Index: INVALID (ref 0.0–2.5)
Total CK: 42 U/L (ref 7–177)
Troponin I: 0.02 ng/mL (ref 0.00–0.06)

## 2010-10-24 LAB — RETICULOCYTES
RBC.: 3.49 MIL/uL — ABNORMAL LOW (ref 3.87–5.11)
Retic Count, Absolute: 34.9 10*3/uL (ref 19.0–186.0)

## 2010-10-24 LAB — FOLATE: Folate: >20 ng/mL

## 2010-10-24 LAB — POCT CARDIAC MARKERS: Myoglobin, poc: 60.3 ng/mL (ref 12–200)

## 2010-10-24 LAB — TSH: TSH: 0.138 u[IU]/mL — ABNORMAL LOW (ref 0.350–4.500)

## 2010-10-25 ENCOUNTER — Telehealth: Payer: Self-pay | Admitting: Internal Medicine

## 2010-10-26 ENCOUNTER — Telehealth: Payer: Self-pay | Admitting: Internal Medicine

## 2010-10-26 ENCOUNTER — Other Ambulatory Visit: Payer: Self-pay | Admitting: Internal Medicine

## 2010-10-26 NOTE — Telephone Encounter (Signed)
Call placed to [facility] [phone] spoke with "Caddell" she was informed per Dr Artist Pais instructions. Instructions faxed to [phone] on lab results, "Caddell" made aware.

## 2010-10-26 NOTE — Miscellaneous (Signed)
Summary: [facility]  [facility]   Imported By: Maryln Gottron 10/18/2010 10:42:17  _____________________________________________________________________  External Attachment:    Type:   Image     Comment:   External Document

## 2010-10-26 NOTE — Telephone Encounter (Signed)
Received fax from lab 10/26/2010 INR 3.2  Next coumadin dose give 1/2 of 3 mg then continue same dose of coumadin 3 mg.    Repeat INR in 1 week

## 2010-10-26 NOTE — Progress Notes (Signed)
Summary: INR-not resulted out  Phone Note From Other Clinic Call back at [phone]   Caller: Darrelyn Hillock  Call For: Dr Artist Pais Summary of Call: [name] with [name] called and left voice message stating patient had blood drawn this morning for her INR check, however she received a message stating the quantity to perform the test was insufficient and they will make another attempt to draw patients blood on Tuesday Initial call taken by: [name],  October 18, 2010 4:25 PM  Follow-up for Phone Call        noted Follow-up by: [name] DO,  October 18, 2010 4:38 PM

## 2010-11-01 LAB — PROTIME-INR

## 2010-11-02 ENCOUNTER — Emergency Department (HOSPITAL_BASED_OUTPATIENT_CLINIC_OR_DEPARTMENT_OTHER)
Admission: EM | Admit: 2010-11-02 | Discharge: 2010-11-02 | Disposition: A | Discharge status: Home / Self Care | Payer: Medicare Other | Attending: Emergency Medicine | Admitting: Emergency Medicine

## 2010-11-02 DIAGNOSIS — F0390 Unspecified dementia without behavioral disturbance: Secondary | ICD-10-CM | POA: Insufficient documentation

## 2010-11-02 DIAGNOSIS — R040 Epistaxis: Secondary | ICD-10-CM | POA: Insufficient documentation

## 2010-11-02 DIAGNOSIS — I4891 Unspecified atrial fibrillation: Secondary | ICD-10-CM | POA: Insufficient documentation

## 2010-11-02 DIAGNOSIS — Z853 Personal history of malignant neoplasm of breast: Secondary | ICD-10-CM | POA: Insufficient documentation

## 2010-11-02 LAB — PROTIME-INR: INR: 2.41 — ABNORMAL HIGH (ref 0.00–1.49)

## 2010-11-03 ENCOUNTER — Encounter: Payer: Self-pay | Admitting: Internal Medicine

## 2010-11-03 ENCOUNTER — Telehealth: Payer: Self-pay | Admitting: *Deleted

## 2010-11-03 NOTE — Telephone Encounter (Signed)
on 11/01/10. See in ED care advice given. They have attempted more than 2 times with direct pressure. Protocol(s) Used: Nosebleed Recommended Outcome per Protocol: See ED Immediately Reason for Outcome: Bleeding not controlled after 2 attempts of constant direct pressure for a full 10 minutes by the clock (each attempt) Care Advice: Call EMS 911 if signs and symptoms of shock develop (such as unable to stand due to faintness, dizziness, or lightheadedness; new onset of confusion; slow to respond or difficult to awaken; skin is pale, gray, cool, or moist to touch; severe weakness; loss of consciousness). ~ ~ IMMEDIATE ACTION Nosebleed Care: - Keep person calm; agitation may increase bleeding. - Sit in an upright position and tilt head forward. - Pinch all the soft parts of the nose just below the bony portion of the nose with the thumb and side of bent index finger. - Maintain firm pressure for a full 5 minutes by a clock. - Breathe through the mouth. - If bleeding is not stopped, apply pressure for another full 5 minutes by a clock. - Apply a cloth-covered ice pack to nose and cheeks. ~ 11/02/2010 5:50:06PM Page 1 of 1 [email or web address]

## 2010-11-03 NOTE — Telephone Encounter (Signed)
[facility] Triage Call Report Triage Record Num: [id number] Operator: April Finney Patient Name: Rita Miller Call Date & Time: 11/02/2010 6:19:17PM Patient Phone: [phone] PCP: Thomos Lemons Patient Gender: Female PCP Fax : [phone] Patient DOB: Nov 28, 1929 Practice Name: [facility] Reason for Call: Lisa/Piney [facility] calling back that daughter refuses for her to go to [facility] or any larger facility. Daughter wants her to go to [facility] on [phone]. [name] has instructed her they may ended up sending her onto one of the other hospitals if they do not have the staff or equipment to care for her there. Daughter does not care and wants her sent to that facility. Instructed [name] to document it and send her on. Verbalized understanding. Protocol(s) Used: Office Note Recommended Outcome per Protocol: Information Noted and Sent to Office Reason for Outcome: Caller information to office Care Advice: ~ 03/

## 2010-11-04 ENCOUNTER — Ambulatory Visit (INDEPENDENT_AMBULATORY_CARE_PROVIDER_SITE_OTHER): Payer: Medicare Other | Admitting: Internal Medicine

## 2010-11-04 ENCOUNTER — Encounter: Payer: Self-pay | Admitting: Internal Medicine

## 2010-11-04 VITALS — BP 112/70 | HR 77 | Temp 97.9°F | Resp 20 | Ht 60.0 in | Wt 107.0 lb

## 2010-11-04 DIAGNOSIS — N644 Mastodynia: Secondary | ICD-10-CM | POA: Insufficient documentation

## 2010-11-04 DIAGNOSIS — J449 Chronic obstructive pulmonary disease, unspecified: Secondary | ICD-10-CM

## 2010-11-04 DIAGNOSIS — D509 Iron deficiency anemia, unspecified: Secondary | ICD-10-CM

## 2010-11-04 DIAGNOSIS — R2681 Unsteadiness on feet: Secondary | ICD-10-CM

## 2010-11-04 DIAGNOSIS — R040 Epistaxis: Secondary | ICD-10-CM

## 2010-11-04 DIAGNOSIS — R269 Unspecified abnormalities of gait and mobility: Secondary | ICD-10-CM

## 2010-11-04 LAB — CBC
MCH: 27.9 pg (ref 26.0–34.0)
MCHC: 31.2 g/dL (ref 30.0–36.0)
MCV: 89.4 fL (ref 78.0–100.0)
Platelets: 343 10*3/uL (ref 150–400)
RDW: 16.7 % — ABNORMAL HIGH (ref 11.5–15.5)

## 2010-11-04 MED ORDER — PHENYLEPHRINE HCL 0.25 % NA SOLN: 2.0000 | Freq: Two times a day (BID) | NASAL | Status: AC | PRN

## 2010-11-04 MED ORDER — VALSARTAN 40 MG PO TABS: 40.0000 mg | ORAL_TABLET | Freq: Every day | ORAL | Status: DC

## 2010-11-04 NOTE — Patient Instructions (Signed)
Our office will contact you re:  Diagnostic mammogram and breast ultrasound

## 2010-11-04 NOTE — Progress Notes (Signed)
Summary: Lab work  Phone Note From [employer] back at [phone]  fax [phone]   Caller: Mertie Clause [facility] Call For: Dr Thomos Lemons Reason for Call: Patient [facility] Summary of Call: voice message left from [facility] with [facility] stating patient was refusing  to have her labs drawn. She states several attempts were made, by lab there and patient refuses. She would like to know if Dr Artist Pais would provide an order for patient to be drawn at an outside lab to have INR checked Initial call taken by: [name],  October 25, 2010 1:48 PM  Follow-up for Phone Call        plz check to see if [facility] has finger prick test for INR Follow-up by: [name] DO,  October 25, 2010 2:30 PM  Additional Follow-up for Phone Call Additional follow up Details #1::        call placed to [facility] for  INr, informed by Okey Regal that Britt Boozer would return the call. Okey Regal stated  the physicians perform there own finger checks  for INR.   Patient will be provided a lab order to have labs drawn outside facility  lab order printed and faxed to [facility] [phone] Additional Follow-up by: [name],  October 25, 2010 3:48 PM    Additional Follow-up for Phone Call Additional follow up Details #2::    call placed to [facility] at [phone], spoke with [name], she was informed lab order was faxed to office, and patient may have INR drawn at the nearest [place] lab by the facility. [name] has verbalized understanding and agreees Follow-up by: [name],  October 25, 2010 4:18 PM

## 2010-11-04 NOTE — Assessment & Plan Note (Signed)
Pt has more wheezing that usual Question ACE contributing Change lisinopril to Diovan 40 mg

## 2010-11-04 NOTE — Assessment & Plan Note (Signed)
Left breast pain of unclear etiology. No palpable mass. Arrange diagnostic mammo and ultrasound

## 2010-11-04 NOTE — Assessment & Plan Note (Signed)
Multifactorial Arrange PT eval

## 2010-11-04 NOTE — Assessment & Plan Note (Signed)
Pt continues to have episodic epistaxis.  Prev referred to ENT.   Ensure ASA has been discontinued Use neo-synephrine NS as needed  Use nasal saline regularly  Check CBC today Last INR within normal range

## 2010-11-04 NOTE — Progress Notes (Signed)
  Subjective:    Patient ID: Rita Miller, female    DOB: [birth year], 77 y.o.   MRN: [id number]  HPI  77 y/o female for ER follow up  She experienced significant nose bleed - pt seen at [facility] ER.  INR  2.41 They use neosynephrine.   Nose bleed stopped.  Pt not sure how much blood she lost. She denies weakness or dizziness  Pt also c/o left breast tenderness.  Left breat lumpy.  She denies new mass  Gait instability - not exercising regularly.  Daughter requests PT  Past Medical History  Diagnosis Date  . CAD (coronary artery disease)   . History of CVA (cerebrovascular accident)   . Atrial fibrillation   . Hyperlipemia   . COPD (chronic obstructive pulmonary disease)   . Epistaxis     recurrent  . Osteoporosis   . Hypothyroidism   . History of pulmonary embolism   . History of colonic diverticulitis   . History of breast cancer   . Asthma   . Iron deficiency anemia   . History of transfusion of whole blood   . LBBB (left bundle branch block)     history of  . Aortal stenosis     severe  . Pacemaker     bradycardia s/p pacemaker implant  . PAD (peripheral artery disease)     left leg occluded viabahn stents x 3 left SFA   History   Social History  . Marital Status: Widowed    Spouse Name: N/A    Number of Children: N/A  . Years of Education: N/A   Occupational History  . Not on file.   Social History Main Topics  . Smoking status: Former Games developer  . Smokeless tobacco: Not on file  . Alcohol Use: Not on file  . Drug Use: Not on file  . Sexually Active: Not on file   Other Topics Concern  . 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    Alcohol Use - no           Review of Systems  Constitutional: Negative for activity change and fatigue.  Respiratory: Negative for shortness of breath.        Objective:   Physical Exam  Constitutional: No distress.  HENT:  Head: Normocephalic and atraumatic.  Eyes:       Small scab inside of left and right nares.  No active bleeding  Cardiovascular: Normal rate and regular rhythm.   Murmur heard. Pulmonary/Chest: Effort normal. She has wheezes.  Neurological: She is alert.  Skin: Skin is warm and dry. She is not diaphoretic.  Psychiatric: She has a normal mood and affect. Her behavior is normal.          Assessment & Plan:

## 2010-11-05 ENCOUNTER — Telehealth: Payer: Self-pay | Admitting: *Deleted

## 2010-11-05 LAB — BASIC METABOLIC PANEL WITH GFR
CO2: 22 mEq/L (ref 19–32)
Calcium: 8.6 mg/dL (ref 8.4–10.5)
Creat: 1.08 mg/dL (ref 0.40–1.20)
GFR, Est African American: 59 mL/min — ABNORMAL LOW (ref >60)
Sodium: 138 mEq/L (ref 135–145)

## 2010-11-05 NOTE — Telephone Encounter (Signed)
Call placed to patients daughter Dossie Arbour at [phone], she was informed per Dr Artist Pais instructions

## 2010-11-05 NOTE — Telephone Encounter (Signed)
Message copied by Glendell Docker on Fri Nov 05, 2010  8:59 AM ------      Message from: Thomos Lemons DR.      Created: Fri Nov 05, 2010  8:23 AM       Call pt - CBC stable.  Electrolytes and kidney stable

## 2010-11-08 ENCOUNTER — Inpatient Hospital Stay: Admission: RE | Admit: 2010-11-08 | Payer: Medicare Other | Source: Ambulatory Visit

## 2010-11-08 LAB — CBC
HCT: 32.3 % — ABNORMAL LOW (ref 36.0–46.0)
HCT: 30.7 % — ABNORMAL LOW (ref 36.0–46.0)
Hemoglobin: 10.8 g/dL — ABNORMAL LOW (ref 12.0–15.0)
Hemoglobin: 11.1 g/dL — ABNORMAL LOW (ref 12.0–15.0)
Hemoglobin: 10.3 g/dL — ABNORMAL LOW (ref 12.0–15.0)
MCHC: 33.1 g/dL (ref 30.0–36.0)
MCV: 89.2 fL (ref 78.0–100.0)
RBC: 3.72 MIL/uL — ABNORMAL LOW (ref 3.87–5.11)
RBC: 3.44 MIL/uL — ABNORMAL LOW (ref 3.87–5.11)
RDW: 27.1 % — ABNORMAL HIGH (ref 11.5–15.5)
WBC: 7.9 10*3/uL (ref 4.0–10.5)

## 2010-11-08 LAB — BASIC METABOLIC PANEL
BUN: 18 mg/dL (ref 6–23)
Calcium: 8.8 mg/dL (ref 8.4–10.5)
Chloride: 102 mEq/L (ref 96–112)
Creatinine, Ser: 1.12 mg/dL (ref 0.4–1.2)
GFR calc Af Amer: 58 mL/min — ABNORMAL LOW (ref >60)
GFR calc non Af Amer: 47 mL/min — ABNORMAL LOW (ref >60)
Glucose, Bld: 77 mg/dL (ref 70–99)
Potassium: 4.7 mEq/L (ref 3.5–5.1)

## 2010-11-08 LAB — LIPID PANEL
Cholesterol: 153 mg/dL (ref 0–200)
LDL Cholesterol: 62 mg/dL (ref 0–99)
Total CHOL/HDL Ratio: 1.8 RATIO

## 2010-11-08 LAB — CULTURE, BLOOD (ROUTINE X 2): Culture: NO GROWTH

## 2010-11-08 LAB — DIFFERENTIAL
Basophils Relative: 1 % (ref 0–1)
Eosinophils Relative: 3 % (ref 0–5)
Lymphs Abs: 1.6 10*3/uL (ref 0.7–4.0)
Monocytes Absolute: 0.4 10*3/uL (ref 0.1–1.0)

## 2010-11-08 LAB — POCT CARDIAC MARKERS: Troponin i, poc: <0.05 ng/mL (ref 0.00–0.09)

## 2010-11-08 LAB — CARDIAC PANEL(CRET KIN+CKTOT+MB+TROPI)
CK, MB: 3 ng/mL (ref 0.3–4.0)
CK, MB: 3.4 ng/mL (ref 0.3–4.0)
Total CK: 52 U/L (ref 7–177)
Total CK: 53 U/L (ref 7–177)

## 2010-11-08 LAB — PROTIME-INR
INR: 1.48 (ref 0.00–1.49)
INR: 1.58 — ABNORMAL HIGH (ref 0.00–1.49)
INR: 2.2 — AB (ref <=1.1)
Prothrombin Time: 18.7 seconds — ABNORMAL HIGH (ref 11.6–15.2)
Prothrombin Time: 16.3 seconds — ABNORMAL HIGH (ref 11.6–15.2)

## 2010-11-09 ENCOUNTER — Ambulatory Visit: Payer: Self-pay | Admitting: Internal Medicine

## 2010-11-09 ENCOUNTER — Encounter: Payer: Self-pay | Admitting: Internal Medicine

## 2010-11-09 NOTE — Patient Instructions (Signed)
Call placed to [facility] at [phone] spoke with "Caddell" she was advised to have patient continue with current coumadin dose and recheck INR in one month.  "Caddell" verbalized understanding and agrees as instructed per MD

## 2010-11-11 ENCOUNTER — Encounter: Payer: Self-pay | Admitting: Internal Medicine

## 2010-11-11 LAB — DIFFERENTIAL
Eosinophils Relative: 5 % (ref 0–5)
Lymphocytes Relative: 20 % (ref 12–46)
Lymphs Abs: 1.2 10*3/uL (ref 0.7–4.0)
Monocytes Absolute: 0.5 10*3/uL (ref 0.1–1.0)
Monocytes Relative: 8 % (ref 3–12)
Neutro Abs: 4.1 10*3/uL (ref 1.7–7.7)

## 2010-11-11 LAB — BASIC METABOLIC PANEL
GFR calc Af Amer: >60 mL/min (ref >60)
GFR calc non Af Amer: 50 mL/min — ABNORMAL LOW (ref >60)
Glucose, Bld: 99 mg/dL (ref 70–99)
Potassium: 4.4 mEq/L (ref 3.5–5.1)
Sodium: 138 mEq/L (ref 135–145)

## 2010-11-11 LAB — CBC
HCT: 31.3 % — ABNORMAL LOW (ref 36.0–46.0)
Hemoglobin: 10.3 g/dL — ABNORMAL LOW (ref 12.0–15.0)
RBC: 3.63 MIL/uL — ABNORMAL LOW (ref 3.87–5.11)
RDW: 17.9 % — ABNORMAL HIGH (ref 11.5–15.5)
WBC: 6.3 10*3/uL (ref 4.0–10.5)

## 2010-11-11 LAB — URINALYSIS, ROUTINE W REFLEX MICROSCOPIC
Bilirubin Urine: NEGATIVE
Glucose, UA: NEGATIVE mg/dL
Hgb urine dipstick: NEGATIVE
Protein, ur: NEGATIVE mg/dL
Specific Gravity, Urine: 1.017 (ref 1.005–1.030)
Urobilinogen, UA: 0.2 mg/dL (ref 0.0–1.0)

## 2010-11-11 LAB — URINE MICROSCOPIC-ADD ON

## 2010-11-11 LAB — APTT: aPTT: 29 seconds (ref 24–37)

## 2010-11-11 LAB — PROTIME-INR: INR: 1.15 (ref 0.00–1.49)

## 2010-11-14 ENCOUNTER — Emergency Department (HOSPITAL_BASED_OUTPATIENT_CLINIC_OR_DEPARTMENT_OTHER)
Admission: EM | Admit: 2010-11-14 | Discharge: 2010-11-14 | Disposition: A | Discharge status: Home / Self Care | Payer: Medicare Other | Attending: Emergency Medicine | Admitting: Emergency Medicine

## 2010-11-14 DIAGNOSIS — Z853 Personal history of malignant neoplasm of breast: Secondary | ICD-10-CM | POA: Insufficient documentation

## 2010-11-14 DIAGNOSIS — R5381 Other malaise: Secondary | ICD-10-CM | POA: Insufficient documentation

## 2010-11-14 DIAGNOSIS — Z79899 Other long term (current) drug therapy: Secondary | ICD-10-CM | POA: Insufficient documentation

## 2010-11-14 DIAGNOSIS — R040 Epistaxis: Secondary | ICD-10-CM | POA: Insufficient documentation

## 2010-11-14 DIAGNOSIS — I4891 Unspecified atrial fibrillation: Secondary | ICD-10-CM | POA: Insufficient documentation

## 2010-11-14 LAB — DIFFERENTIAL
Basophils Absolute: 0 10*3/uL (ref 0.0–0.1)
Lymphocytes Relative: 5 % — ABNORMAL LOW (ref 12–46)
Lymphs Abs: 0.4 10*3/uL — ABNORMAL LOW (ref 0.7–4.0)
Neutro Abs: 6.6 10*3/uL (ref 1.7–7.7)
Neutrophils Relative %: 88 % — ABNORMAL HIGH (ref 43–77)

## 2010-11-14 LAB — CBC
HCT: 30 % — ABNORMAL LOW (ref 36.0–46.0)
MCV: 85.5 fL (ref 78.0–100.0)
RBC: 3.51 MIL/uL — ABNORMAL LOW (ref 3.87–5.11)
RDW: 15.8 % — ABNORMAL HIGH (ref 11.5–15.5)
WBC: 7.5 10*3/uL (ref 4.0–10.5)

## 2010-11-14 LAB — BASIC METABOLIC PANEL
CO2: 22 mEq/L (ref 19–32)
Calcium: 8.5 mg/dL (ref 8.4–10.5)
Chloride: 106 mEq/L (ref 96–112)
GFR calc Af Amer: 58 mL/min — ABNORMAL LOW (ref >60)
Sodium: 141 mEq/L (ref 135–145)

## 2010-11-14 LAB — APTT: aPTT: 48 seconds — ABNORMAL HIGH (ref 24–37)

## 2010-11-14 LAB — PROTIME-INR: INR: 2.61 — ABNORMAL HIGH (ref 0.00–1.49)

## 2010-11-15 ENCOUNTER — Telehealth: Payer: Self-pay | Admitting: *Deleted

## 2010-11-15 NOTE — Telephone Encounter (Signed)
Triage Record Num: [id number] Operator: Chevis Pretty Patient Name: Rita Miller Call Date & Time: 11/14/2010 2:24:31PM Patient Phone: [phone] PCP: Thomos Lemons Patient Gender: Female PCP Fax : [phone] Patient DOB: July 03, 1930 Practice Name: [facility] Reason for Call: Johnathan [facility], calling regarding resp congestion. PCP is Thomos Lemons. Callback number is [phone]. States onset 11/14/10 of cough and thick sputum. Began coughing up bloody sputum 1300. Cough is continuous. States patient feels unwell and c/o nausea. Family wanted her sent out for evaluation and so sent to [facility] ED. Approved per practice profile/preference; info to office. Facility to follow up with PCP in AM. Protocol(s) Used: PCP Calls, No Triage (Adult) Recommended Outcome per Protocol: Call Provider Immediately Reason for Outcome: Notification of hospital admission

## 2010-11-15 NOTE — Telephone Encounter (Signed)
See above phone note.  

## 2010-11-24 ENCOUNTER — Telehealth: Payer: Self-pay | Admitting: Internal Medicine

## 2010-11-24 NOTE — Telephone Encounter (Signed)
Please call Dr Dorcas Carrow [phone].  Patient needs to have some oral surgery and he would like to discuss this with you prior to making an appt.

## 2010-11-25 ENCOUNTER — Telehealth: Payer: Self-pay | Admitting: Internal Medicine

## 2010-11-25 DIAGNOSIS — I4891 Unspecified atrial fibrillation: Secondary | ICD-10-CM

## 2010-11-25 NOTE — Telephone Encounter (Signed)
Received notice that left breast biopsy was canceled However, coumadin held for 3 days  I suggest repeat INR -  See order

## 2010-11-25 NOTE — Telephone Encounter (Signed)
Spoke with oral surgeon - Dr. Egbert Garibaldi.   He is planning extraction of lower teeth.  He would like to hold coumadin x 2 days.  Ok if INR low 2 range.  Repeat INR planned within next 24-48 hrs.  Will forward copy of results to Dr. Egbert Garibaldi  I suggested pt receive SBE prophylaxis.  He is planning to give amoxicillin.  (Darlene,  Please make sure Dr. Egbert Garibaldi get copy of her upcoming INR .  His office number [phone])

## 2010-11-29 ENCOUNTER — Telehealth: Payer: Self-pay | Admitting: Internal Medicine

## 2010-11-29 NOTE — Telephone Encounter (Signed)
Call returned to Alyson Ingles at [facility] she was advised to obtain a urine analysis with urine culture, and draw INR on patient on 11/30/2010,per Dr Artist Pais instructions. She verbalized understanding and agrees.

## 2010-11-29 NOTE — Telephone Encounter (Signed)
Christy from [facility] that she faxed over a physical therapy and order for patient last week but has not heard back from [place].

## 2010-11-29 NOTE — Telephone Encounter (Signed)
Call from [place] at [facility] [phone],JXB left voice message stating patient was complaining of burning with urination, abdominal tenderness, and nausea. Her message stated patient vital signs were normal,and she does not have a fever. She would like to know if they could obtain an order for Urinalysis

## 2010-11-30 ENCOUNTER — Encounter: Payer: Self-pay | Admitting: Internal Medicine

## 2010-11-30 ENCOUNTER — Ambulatory Visit (INDEPENDENT_AMBULATORY_CARE_PROVIDER_SITE_OTHER): Payer: Medicare Other | Admitting: Internal Medicine

## 2010-11-30 DIAGNOSIS — I4891 Unspecified atrial fibrillation: Secondary | ICD-10-CM

## 2010-11-30 DIAGNOSIS — N644 Mastodynia: Secondary | ICD-10-CM

## 2010-11-30 DIAGNOSIS — C50919 Malignant neoplasm of unspecified site of unspecified female breast: Secondary | ICD-10-CM

## 2010-11-30 NOTE — Progress Notes (Signed)
Subjective:    Patient ID: Rita Miller, female    DOB: [birth year], 76 y.o.   MRN: [id number]  HPI 77 y/o female for follow up.  Pt planning to have dental extraction next .    Pt on coumadin for chronic Afib.  She also has hx of severe aortic stenosis and severe PVD.  Oral surgeon planning to hold coumadin before surgery and provide SBE prophylaxis  She continues to have intemittent left breast pain. She was sent for u/s and possible biopsy of left breast  (question two 0.6 cm hypoechoic masses 2-3 o'clock position)   Review of Systems No chest pain    Past Medical History  Diagnosis Date  . CAD (coronary artery disease)   . History of CVA (cerebrovascular accident)   . Atrial fibrillation     peristent  . Hyperlipemia   . COPD (chronic obstructive pulmonary disease)   . Epistaxis     recurrent  . Osteoporosis   . Hypothyroidism   . History of pulmonary embolism   . History of colonic diverticulitis   . History of breast cancer   . Asthma   . Iron deficiency anemia   . History of transfusion of whole blood   . LBBB (left bundle branch block)     history of  . Aortal stenosis     severe  . Pacemaker     bradycardia s/p pacemaker implant  . PAD (peripheral artery disease)     left leg occluded viabahn stents x 3 left SFA  . Tachycardia-bradycardia syndrome     s/p PPM    History   Social History  . Marital Status: Widowed    Spouse Name: N/A    Number of Children: N/A  . Years of Education: N/A   Occupational History  . Not on file.   Social History Main Topics  . Smoking status: Former Games developer  . Smokeless tobacco: Not on file  . Alcohol Use: No  . Drug Use: No  . Sexually Active: Not on file   Other Topics Concern  . 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 Thyroidectomy     due to cancer  . Total hip arthroplasty   . Abdominal hysterectomy   . Mastectomy   . Mitral  valve repair   . Intraocular lens insertion     left eye implant  . Pacemaker placement     for tachy/brady syndrome    Family History  Problem Relation Age of Onset  . Breast cancer    . Colon cancer    . Diabetes    . Hyperlipidemia    . Hypertension    . Heart disease Mother     unknown heart disease    Allergies  Allergen Reactions  . Donepezil Hydrochloride     REACTION: heart fluttering  . Morphine Sulfate   . Oxycodone-Acetaminophen     REACTION: Hallucinations    Current Outpatient Prescriptions on File Prior to Visit  Medication Sig Dispense Refill  . acetaminophen (TYLENOL) 325 MG tablet Take 650 mg by mouth every 6 (six) hours as needed.        Marland Kitchen albuterol (PROVENTIL) (2.5 MG/3ML) 0.083% nebulizer solution Take 2.5 mg by nebulization 4 (four) times daily.        Marland Kitchen diltiazem (CARDIZEM CD) 240 MG 24 hr capsule Take 240 mg by mouth  daily.        . fluticasone (FLOVENT HFA) 44 MCG/ACT inhaler Inhale 1 puff into the lungs 2 (two) times daily.        Marland Kitchen HYDROcodone-acetaminophen (VICODIN) 2.5-500 MG per tablet Take 1 tablet by mouth. 1/2 tablet by mouth twice a day as needed       . levothyroxine (SYNTHROID, LEVOTHROID) 88 MCG tablet Take 88 mcg by mouth daily.        . Melatonin 3 MG TABS Take 3 mg by mouth at bedtime as needed.        . memantine (NAMENDA) 5 MG tablet Take 5 mg by mouth 2 (two) times daily.        . ondansetron (ZOFRAN) 8 MG tablet Take by mouth every 6 (six) hours as needed.        . rivastigmine (EXELON) 4.6 mg/24hr Place 1 patch onto the skin daily.        . valsartan (DIOVAN) 40 MG tablet Take 1 tablet (40 mg total) by mouth daily.  30 tablet  11  . warfarin (COUMADIN) 3 MG tablet Take 3 mg by mouth daily.          BP 114/60  Pulse 84  Temp(Src) 97.8 F (36.6 C) (Oral)  Resp 20  Wt 105 lb (47.628 kg)  SpO2 98%    Objective:   Physical Exam  Constitutional:       Pleasant, slightly pale 77 y/o female  Cardiovascular: Normal rate and  regular rhythm.  Exam reveals no gallop and no friction rub.   Pulmonary/Chest: Effort normal and breath sounds normal. She has no wheezes. She has no rales.  Skin: Skin is warm and dry.       Chronic mottling of left foot Left foot slightly cool to touch ( no change )  Psychiatric: She has a normal mood and affect. Her behavior is normal.          Assessment & Plan:

## 2010-11-30 NOTE — Telephone Encounter (Signed)
Spoke with Eunice Blase at [facility] [phone] she stated patient is scheduled for oral surgery on May 16th and they have an order to hold her coumadin 2 days prior to procedure as well as starting an antibiotic.  Patient is scheduled for follow up this afternoon at 2:30pm

## 2010-12-01 ENCOUNTER — Encounter: Payer: Self-pay | Admitting: Internal Medicine

## 2010-12-01 ENCOUNTER — Ambulatory Visit (INDEPENDENT_AMBULATORY_CARE_PROVIDER_SITE_OTHER): Payer: Medicare Other | Admitting: Internal Medicine

## 2010-12-01 ENCOUNTER — Other Ambulatory Visit: Payer: Self-pay | Admitting: Internal Medicine

## 2010-12-01 DIAGNOSIS — I4891 Unspecified atrial fibrillation: Secondary | ICD-10-CM

## 2010-12-01 DIAGNOSIS — I495 Sick sinus syndrome: Secondary | ICD-10-CM | POA: Insufficient documentation

## 2010-12-01 DIAGNOSIS — Z950 Presence of cardiac pacemaker: Secondary | ICD-10-CM

## 2010-12-01 NOTE — Progress Notes (Signed)
The patient presents today for routine electrophysiology followup.  Since last being seen in our clinic, the patient reports doing reasonably well.  She has severe AS and multiple comorbidities but appears stable.  Today, she denies symptoms of palpitations, chest pain, shortness of breath, orthopnea, PND, lower extremity edema, dizziness, presyncope, syncope, or neurologic sequela.  Her pacemaker site is chronically tender but stable.  The patient feels that she is tolerating medications without difficulties and is otherwise without complaint today.   Past Medical History  Diagnosis Date  . CAD (coronary artery disease)   . History of CVA (cerebrovascular accident)   . Atrial fibrillation     peristent  . Hyperlipemia   . COPD (chronic obstructive pulmonary disease)   . Epistaxis     recurrent  . Osteoporosis   . Hypothyroidism   . History of pulmonary embolism   . History of colonic diverticulitis   . History of breast cancer   . Asthma   . Iron deficiency anemia   . History of transfusion of whole blood   . LBBB (left bundle branch block)     history of  . Aortal stenosis     severe  . Pacemaker     bradycardia s/p pacemaker implant  . PAD (peripheral artery disease)     left leg occluded viabahn stents x 3 left SFA  . Tachycardia-bradycardia syndrome     s/p PPM   Past Surgical History  Procedure Date  . Thyroidectomy     due to cancer  . Total hip arthroplasty   . Abdominal hysterectomy   . Mastectomy   . Mitral valve repair   . Intraocular lens insertion     left eye implant  . Pacemaker placement     for tachy/brady syndrome    Current Outpatient Prescriptions  Medication Sig Dispense Refill  . acetaminophen (TYLENOL) 325 MG tablet Take 650 mg by mouth every 6 (six) hours as needed.        Marland Kitchen albuterol (PROVENTIL) (2.5 MG/3ML) 0.083% nebulizer solution Take 2.5 mg by nebulization 4 (four) times daily.        Marland Kitchen atorvastatin (LIPITOR) 20 MG tablet Take 20 mg by  mouth daily.        Marland Kitchen diltiazem (CARDIZEM CD) 240 MG 24 hr capsule Take 240 mg by mouth daily.        . fluticasone (FLOVENT HFA) 44 MCG/ACT inhaler Inhale 1 puff into the lungs 2 (two) times daily.        Marland Kitchen HYDROcodone-acetaminophen (VICODIN) 2.5-500 MG per tablet Take 1 tablet by mouth. 1/2 tablet by mouth twice a day as needed       . levothyroxine (SYNTHROID, LEVOTHROID) 88 MCG tablet Take 88 mcg by mouth daily.        . Melatonin 3 MG TABS Take 3 mg by mouth at bedtime as needed.        . memantine (NAMENDA) 5 MG tablet Take 5 mg by mouth 2 (two) times daily.        . NON FORMULARY neosynephrine nasal spray 0.25% Two sprays in each nostril every 12 hours as needed.       . NON FORMULARY Prednisone 60 mg daily       . ondansetron (ZOFRAN) 8 MG tablet Take by mouth every 6 (six) hours as needed.        . rivastigmine (EXELON) 4.6 mg/24hr Place 1 patch onto the skin daily.        Marland Kitchen  valsartan (DIOVAN) 40 MG tablet Take 1 tablet (40 mg total) by mouth daily.  30 tablet  11  . warfarin (COUMADIN) 3 MG tablet Take 3 mg by mouth daily.        Marland Kitchen DISCONTD: Nystatin (PEDI-DRI) 100000 UNIT/GM POWD Apply topically. Apply under left breast three times a day       . DISCONTD: rosuvastatin (CRESTOR) 20 MG tablet Take 20 mg by mouth daily.          Allergies  Allergen Reactions  . Donepezil Hydrochloride     REACTION: heart fluttering  . Morphine Sulfate   . Oxycodone-Acetaminophen     REACTION: Hallucinations    History   Social History  . Marital Status: Widowed    Spouse Name: N/A    Number of Children: N/A  . Years of Education: N/A   Occupational History  . Not on file.   Social History Main Topics  . Smoking status: Former Games developer  . Smokeless tobacco: Not on file  . Alcohol Use: No  . Drug Use: No  . Sexually Active: Not on file   Other Topics Concern  . 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 Breast cancer    . Colon cancer    . Diabetes    . Hyperlipidemia    . Hypertension    . Heart disease Mother     unknown heart disease   Physical Exam: Filed Vitals:   12/01/10 1442  BP: 114/60  Pulse: 70  Weight: 106 lb 1.9 oz (48.136 kg)    GEN- The patient is elderly and chronically ill, alert and oriented x 3 today.   Head- normocephalic, atraumatic Eyes-  Sclera clear, conjunctiva pink Ears- hearing intact Oropharynx- clear Neck- supple, no JVP Lymph- no cervical lymphadenopathy Lungs- Clear to ausculation bilaterally, normal work of breathing Chest-  R sided pacemaker pocket is well healed but tender Heart- Regular rate and rhythm,2/6 SEM LUSB which is late peaking, S2 is not audible today,   GI- soft, NT, ND, + BS Extremities- no clubbing, cyanosis, or edema MS- no significant deformity or atrophy Skin- no rash or lesion Psych- euthymic mood, full affect Neuro- strength and sensation are intact  Pacemaker interrogation- reviewed in detail today,  See PACEART report  Assessment and Plan:

## 2010-12-01 NOTE — Assessment & Plan Note (Signed)
She is predominantly in atrial fibrillation but rate controlled and appropriately anticoagulated with coumadin. Continue rate control longterm.

## 2010-12-01 NOTE — Assessment & Plan Note (Signed)
Normal pacemaker function See Pace Art report No changes today  

## 2010-12-01 NOTE — Patient Instructions (Signed)
Your physician recommends that you schedule a follow-up appointment in: 6 months with pacer clinic

## 2010-12-03 ENCOUNTER — Ambulatory Visit: Payer: Medicare Other | Admitting: Internal Medicine

## 2010-12-03 ENCOUNTER — Emergency Department (HOSPITAL_BASED_OUTPATIENT_CLINIC_OR_DEPARTMENT_OTHER)
Admission: EM | Admit: 2010-12-03 | Discharge: 2010-12-04 | Disposition: A | Discharge status: Home / Self Care | Payer: Medicare Other | Attending: Emergency Medicine | Admitting: Emergency Medicine

## 2010-12-03 ENCOUNTER — Emergency Department (INDEPENDENT_AMBULATORY_CARE_PROVIDER_SITE_OTHER): Payer: Medicare Other

## 2010-12-03 DIAGNOSIS — R05 Cough: Secondary | ICD-10-CM

## 2010-12-03 DIAGNOSIS — R0602 Shortness of breath: Secondary | ICD-10-CM | POA: Insufficient documentation

## 2010-12-03 DIAGNOSIS — I517 Cardiomegaly: Secondary | ICD-10-CM | POA: Insufficient documentation

## 2010-12-03 DIAGNOSIS — I4891 Unspecified atrial fibrillation: Secondary | ICD-10-CM | POA: Insufficient documentation

## 2010-12-03 DIAGNOSIS — E785 Hyperlipidemia, unspecified: Secondary | ICD-10-CM | POA: Insufficient documentation

## 2010-12-03 DIAGNOSIS — Z853 Personal history of malignant neoplasm of breast: Secondary | ICD-10-CM | POA: Insufficient documentation

## 2010-12-03 DIAGNOSIS — Z79899 Other long term (current) drug therapy: Secondary | ICD-10-CM | POA: Insufficient documentation

## 2010-12-06 ENCOUNTER — Encounter (HOSPITAL_BASED_OUTPATIENT_CLINIC_OR_DEPARTMENT_OTHER): Payer: Medicare Other | Admitting: Hematology & Oncology

## 2010-12-06 ENCOUNTER — Other Ambulatory Visit: Payer: Self-pay | Admitting: Hematology & Oncology

## 2010-12-06 ENCOUNTER — Telehealth: Payer: Self-pay | Admitting: Internal Medicine

## 2010-12-06 ENCOUNTER — Encounter: Payer: Self-pay | Admitting: Internal Medicine

## 2010-12-06 DIAGNOSIS — Z954 Presence of other heart-valve replacement: Secondary | ICD-10-CM

## 2010-12-06 DIAGNOSIS — Z7901 Long term (current) use of anticoagulants: Secondary | ICD-10-CM

## 2010-12-06 DIAGNOSIS — C50419 Malignant neoplasm of upper-outer quadrant of unspecified female breast: Secondary | ICD-10-CM

## 2010-12-06 DIAGNOSIS — Z853 Personal history of malignant neoplasm of breast: Secondary | ICD-10-CM

## 2010-12-06 DIAGNOSIS — D509 Iron deficiency anemia, unspecified: Secondary | ICD-10-CM

## 2010-12-06 LAB — COMPREHENSIVE METABOLIC PANEL
BUN: 26 mg/dL — ABNORMAL HIGH (ref 6–23)
CO2: 18 mEq/L — ABNORMAL LOW (ref 19–32)
Calcium: 8.8 mg/dL (ref 8.4–10.5)
Chloride: 106 mEq/L (ref 96–112)
Creatinine, Ser: 1.11 mg/dL (ref 0.40–1.20)

## 2010-12-06 LAB — CBC WITH DIFFERENTIAL (CANCER CENTER ONLY)
BASO#: 0.1 10*3/uL (ref 0.0–0.2)
BASO%: 0.9 % (ref 0.0–2.0)
EOS%: 5.3 % (ref 0.0–7.0)
Eosinophils Absolute: 0.4 10*3/uL (ref 0.0–0.5)
HGB: 8.8 g/dL — ABNORMAL LOW (ref 11.6–15.9)
MCH: 27.8 pg (ref 26.0–34.0)
MCV: 86 fL (ref 81–101)
MONO#: 0.8 10*3/uL (ref 0.1–0.9)
NEUT#: 4.2 10*3/uL (ref 1.5–6.5)
Platelets: 257 10*3/uL (ref 145–400)
RBC: 3.16 10*6/uL — ABNORMAL LOW (ref 3.70–5.32)
WBC: 6.8 10*3/uL (ref 3.9–10.0)

## 2010-12-06 LAB — RETICULOCYTES (CHCC): RBC.: 3.24 MIL/uL — ABNORMAL LOW (ref 3.87–5.11)

## 2010-12-06 LAB — FERRITIN: Ferritin: 25 ng/mL (ref 10–291)

## 2010-12-06 LAB — IRON AND TIBC
Iron: 36 ug/dL — ABNORMAL LOW (ref 42–145)
TIBC: 353 ug/dL (ref 250–470)
UIBC: 317 ug/dL

## 2010-12-06 NOTE — Telephone Encounter (Signed)
Call was returned to [phone], no answer. A voice message was left informing requested forms have been faxed ,and if they have not been received she was advised to call back

## 2010-12-06 NOTE — Telephone Encounter (Signed)
Rita Miller from [facility] that she still has not received anything from our office about the physical therapy eval and physicians orders on pt. [name] states that she re-faxed request on Friday. Phone# [phone] and that # is also their fax#

## 2010-12-07 ENCOUNTER — Telehealth: Payer: Self-pay | Admitting: *Deleted

## 2010-12-07 NOTE — Telephone Encounter (Signed)
INR level was 3.56 yesterday evening. Pt currently taking Warfarin 3mg  daily. Dose was with held yesterday and will be on hold until we give further instructions. Pt is having nosebleeds more than usual.  Fax) [phone]. Please advise.

## 2010-12-07 NOTE — Telephone Encounter (Signed)
Continue to hold coumadin.  Repeat INR in 3 days

## 2010-12-07 NOTE — Telephone Encounter (Signed)
Call placed to [facility] at [phone], spoke with Caddell she was informed per Dr Artist Pais instructions

## 2010-12-08 ENCOUNTER — Telehealth: Payer: Self-pay | Admitting: Internal Medicine

## 2010-12-08 NOTE — Telephone Encounter (Signed)
Per christy at [facility] that she has not received those orders(previous phone note from 4.30.12). I told her that darlene did fax those on 12-06-10. Please fax again.

## 2010-12-13 ENCOUNTER — Telehealth: Payer: Self-pay | Admitting: *Deleted

## 2010-12-13 NOTE — Telephone Encounter (Signed)
Ok for pt to start 2 mg dose of coumadin and INR to be completed this week

## 2010-12-13 NOTE — Telephone Encounter (Signed)
[name] from [facility] called to let us know that pt will have PT checked this Thursday and wanted to know if Dr Artist Pais was ok with the current change of coumadin to 2mg  a day?  Please advise.

## 2010-12-13 NOTE — Telephone Encounter (Signed)
[facility] Triage Call Report Triage Record Num: [id number] Operator: Ether Griffins Patient Name: Rita Miller Call Date & Time: 12/11/2010 4:40:59PM Patient Phone: [phone] PCP: Thomos Lemons Patient Gender: Female PCP Fax : [phone] Patient DOB: 1929/09/28 Practice Name: [facility] [place] Reason for Call: Alyson Ingles RN calling fron [facility] and [facility] about PT/INR results drawn 12/10/10--12.8/1.18.Was taking Coumadin 3 mg QD--have been holding dose since 12/07/10 for severe nose bleeds.Last PT/INR--3.56 drawn on 12/06/10.No reported bleeding today(12/11/10).Contacted Dr Domenic Polite ordered Coumadin 2 mg QD, recheck PT/INR in 1 week,follow up with Dr Quillian Quince) on Monday 12/13/10. Protocol(s) Used: Office Note Recommended Outcome per Protocol: Information Noted and Sent to Office Reason for Outcome: Caller information to office Care Advice: ~ 12/11/2010 5:08:20PM Page 1 of 1 [email or web address]

## 2010-12-13 NOTE — Telephone Encounter (Signed)
Notified [name] @ [facility] per Dr Olegario Messier instruction. She advises that PT/INR will be rechecked on Thursday.

## 2010-12-15 ENCOUNTER — Encounter (HOSPITAL_BASED_OUTPATIENT_CLINIC_OR_DEPARTMENT_OTHER): Payer: Medicare Other | Admitting: Hematology & Oncology

## 2010-12-15 DIAGNOSIS — D509 Iron deficiency anemia, unspecified: Secondary | ICD-10-CM

## 2010-12-20 ENCOUNTER — Telehealth: Payer: Self-pay | Admitting: *Deleted

## 2010-12-20 NOTE — Assessment & Plan Note (Signed)
Biopsy of questional mass on left breast cancelled due to patient discomfort.  Repeat breast u/s planned in 6 months. Arrange f/u with oncologist - Dr. Myna Hidalgo

## 2010-12-20 NOTE — Telephone Encounter (Signed)
Triage Record Num: [id number] Operator: Rita Miller Patient Name: Rita Miller Call Date & Time: 12/17/2010 10:49:18PM Patient Phone: [phone] PCP: Thomos Lemons Patient Gender: Female PCP Fax : [phone] Patient DOB: 1930/01/31 Practice Name: [facility] [place] Reason for Call: [name]/RN w/ [name] calling w/ INR 1.34 & PT 14.4. Coumadin decreased from 3mg  to 2mg  after INR 1.18 on 12-10-10. Consulted w/ Dr Alphonsus Sias, advised to increase Coumadin back to 3mg  and redraw INR & PT in 1 week, and f/u w/ Dr Artist Pais on 12-20-10 d/t Pt is due to have dental work on 12-22-10, Pt is suppose to have Coumadin held on 12-20-10. Rn verbalized understanding.

## 2010-12-20 NOTE — Assessment & Plan Note (Signed)
Proceed with dental extraction.   Oral surgeon planning to hold coumadin x 2 days. Follow INR after procedure

## 2010-12-21 NOTE — Assessment & Plan Note (Signed)
OFFICE VISIT   AWA, BACHICHA  DOB:  06-28-1930                                       06/18/2010  ZOXWR#:[id number]   This is an established patient followup.   HISTORY OF PRESENT ILLNESS:  This is a 77 year old female that presents  for followup with her power of attorney with previous thought of  possibly performing a diagnostic aortogram and possible left leg  intervention.  Unfortunately, at he last clinic visit, she was not able  to give consent due to her mental status.  The patient at this point has  had no change in her symptomatology.  I discussed briefly the nature of  her problem with her POA, her daughter.  Her daughter mentioned that  about a year ago Dr. Reuel Boom had tried to intervene on her left leg and  she had complications at that point and they were not able to intervene.  At this point the daughter also mentioned that the bluish hue in her  foot actually resolves with elevation of the foot above the level of the  heart.  Once again the patient notes no significant change in her  symptomatology.   Her past medical history, past surgical history, family history, review  of systems, medication and allergies are completely unchanged from her  previous office visit on 06/04/2010.   PHYSICAL EXAMINATION:  Today she had a blood pressure 138/70, heart rate  of 78, respirations were 12.  Focused exam:  Her feet have a bluish hue  and ischemic changes in the toes.  There is, however, no significant  change from the previous examination.  I do not see any frank gangrene  or ulceration in the foot.   MEDICAL DECISION MAKING:  This is a 77 year old female who has known  peripheral arterial disease, previously has had stenting in her left  leg.  I reviewed Dr. Rosann Auerbach note.  It appears actually that in May of  2011, the patient underwent angiogram which demonstrated  that in fact  she had multiple stents in the left system.  There is a common  iliac  stent and then the entirety of the SFA is basically stented.  Distally  the popliteal artery appears to have a misadventure with a popliteal  stent that is directed out of the intraluminal space, and subsequently  Dr. Reuel Boom was unable to recanalize the popliteal artery.  Based on this  review of this note I doubt that she would be a candidate for  intervention.  I discussed this with the patient's power attorney, her  daughter, and we both agree at this point that there is unlikely any  advantage to performing what would likely be purely a diagnostic  procedure.  Given this patient's high-grade aortic stenosis she would be  at very high risk for death from a bypass procedure and it is the  feeling of the patient and the daughter that no surgical intervention  will be undertaken in this patient's case.  I discussed with the patient  and the daughter the natural history of critical limb ischemia with a  quarter going on to require amputation and almost a third passing due to  other comorbidities.  In this patient I would suspect her heart would be  bigger medical issue than the leg.  So at this point I think there is no  advantage to aggressively intervening in this patient.  She will  continue with maximal medical management and local wound care and then  will follow up in 3 months with repeat set of ABIs to see if at this  point any significant changes have happened and whether or not this  patient was willing to reconsider possibility of amputation if she  develops frank tissue loss.     Leonides Sake, MD  Electronically Signed   BC/MEDQ  D:  06/18/2010  T:  06/21/2010  Job:  [id number]

## 2010-12-21 NOTE — Consult Note (Signed)
NEW PATIENT CONSULTATION   Cinquemani, Natalye  DOB:  09-16-1929                                       06/04/2010  ZOXWR#:[id number]   REFERRED BY:  Magdalen Spatz [facility].   REASON FOR CONSULTATION:  Left foot ischemia.   HISTORY PRESENT ILLNESS:  This is a 77 year old female with known  history peripheral arterial disease who presents with chief complaint of  severe left foot pain.  The patient is demented and has limited memory.  She is not able to describe exactly when she started developing onset of  foot pain but at this point she notes that it is enough to wake her up  out of sleep.  She does not ambulate any significant distance with this  foot.  She does not remember if she has ever had any lesions her left  foot or any type of ulceration. She is not really able to tell me  whether or not she ever had any symptoms consistent with intermittent  claudication.   PAST MEDICAL HISTORY:  1. Peripheral arterial disease.  2. Severe aortic stenosis.  3. History of mitral valve prolapse.  4. History of aortic regurgitation.  5. Hypertension.  6. Hyperlipidemia.  7. Coronary artery disease.  8. History of atrial fibrillation.  9. History of pulmonary hypertension.  10.History of dementia.   PAST SURGICAL HISTORY:  Coronary catheterization and stent placement.  Additionally, she had some type of stent intervention in the left leg.   SOCIAL HISTORY:  She denies any smoking, alcohol or illicit drug use.   FAMILY HISTORY:  Cannot be obtained from this patient due to dementia.   REVIEW OF SYSTEMS:  Also was not obtainable due to her dementia.   CURRENT MEDICATIONS:  From her care facility include albuterol, aspirin,  Augmentin, diltiazem, doxycycline, Exelon patch,  fentanyl patch,  Flovent, Levaquin, Lidoderm, Lipitor, lisinopril, Lovenox, melatonin,  Namenda,  Synthroid, Vicodin, warfarin, Diflucan,   ALLERGIES:  There are no known  drug allergies that are listed.   PHYSICAL EXAMINATION:  Vitals:  She had a blood pressure 108/49, heart rate of 75, respirations were 12,  saturating 96%.  General:  She is alert, oriented x3.  Easily distractible, does not  answer the questions appropriately.  Head:  Normocephalic, atraumatic.  Bilateral temporalis wasting. ENT:  Hearing is grossly intact.  Nares without any erythema or drainage.  Oropharynx without any erythema or exudate.  Eyes:  Pupils were equal, round, reactive to light.  Extraocular  movements were intact.  Neck:  She had a supple neck.  Nuchal rigidity.  No palpable  lymphadenopathy.  Pulmonary:  Symmetric expansion.  Good air movement.  Clear auscultation  bilaterally.  No rales, rhonchi or wheezing.  Cardiac:  She had a 3-4/6 systolic ejection murmur that was heard  throughout all listening areas of the heart.  I do not appreciate any  gallops, thrills or rubs.  She had regular rate and rhythm.  Vascular:  She had palpable upper extremity pulses.  Her carotids were  easily palpable without any bruits.  Her aorta was not palpable.  Bilateral femorals were palpable.  No popliteal arteries were palpable  and bilateral pedal pulses were not palpable on either side.  GI:  Soft abdomen nontender, nondistended guarding or rebound. No  hepatosplenomegaly.  No masses.  No costovertebral angle tenderness.  Musculoskeletal:  She had 5/5 strength in upper extremities and her  right leg was 5/5.  She would not let me test her left leg.  The left  foot demonstrates ischemic changes in the first through fifth toes.  At  the tip of the second through the fifth toe there is a bluish hue and at  the metatarsals, there is also some ischemic changes on the plantar  surfaces.  There is no frank ulcerations on either side.  The left foot  was erythemic with a purplish hue up to level the ankle with no frank  drainage from any wounds.  Neurologic:  She was not able to follow  instructions to complete a full  neurologic test.  Grossly she seemed to have sensation intact in the  upper extremities.  She would not let me fully examine the left foot due  to severe pain even with light touch to the left foot.  Motor exam as  listed above.  Psych: Judgment was not intact and she appeared inappropriately happy  based on her clinical situation.  Skin: As in extremity exam as listed above.  Otherwise no other rashes  were noted elsewhere.  Lymphatics:  There were no cervical, axillary, inguinal lymphadenopathy.   I reviewed outside documentation, about 10 pages.  This included an ABI  which demonstrated an ABI of 0.9 on the right side and  0.38 on the left  side.  The dorsalis pedis is multiphasic on the right side.  The  posterior tibials monophasic.  On the left side both the dorsalis pedis  and the posterior tibial are monophasic.  The posterior tibial is nearly  a flat line.   MEDICAL DECISION MAKING:  This is a 77 year old demented patient with  multiple high-grade comorbidities.  By report she previously had some  type of left sided intervention with stent placement.  With multiple  high-grade morbidities, especially the aortic stenosis, I would doubt  that she is a candidate for any type of surgical intervention so at this  point my recommendation would be to proceed with an aortogram, bilateral  leg runoff which I suspect be mainly diagnostic as if a previous stent  has been placed and this is likely thrombosed, preventing use from  recanalizing the vessels.  The other issue is she is demented and unable  to make any decisions on her own.  Her daughter by report is the power  attorney and we need to have her come in so we did discuss in person her  mother's medical issues, and then obtain consent to proceed forward with  any procedure.  At this point, we are pending a power-of-attorney  decision to proceed forward.  I do not believe this patient is a   surgical candidate and an aggressive endovascular stance should be taken  in this patient.     Leonides Sake, MD  Electronically Signed   BC/MEDQ  D:  06/04/2010  T:  06/07/2010  Job:  [id number]

## 2010-12-22 ENCOUNTER — Encounter: Payer: Self-pay | Admitting: Internal Medicine

## 2010-12-24 ENCOUNTER — Other Ambulatory Visit: Payer: Self-pay | Admitting: *Deleted

## 2010-12-24 NOTE — Telephone Encounter (Signed)
Orders received from [facility] for Synthroid

## 2010-12-27 NOTE — Telephone Encounter (Signed)
Spoke to Manito at [facility] [phone], she states pt had teeth extracted. Has done well, no bleeding since procedure. Pt resumed Coumadin 3mg  daily and will have PT/INR checked tomorrow per order by dentist. [name] states she will fax [place] the results.

## 2010-12-28 ENCOUNTER — Telehealth: Payer: Self-pay | Admitting: *Deleted

## 2010-12-28 NOTE — Telephone Encounter (Signed)
Rita Miller with [facility] called and left voice message stating patient refused PT/INR draw yesterday, and they need to sent her out. She would like to know if they could get  Standing order for patient to have her PT/INR checked

## 2010-12-28 NOTE — Telephone Encounter (Signed)
Noted  

## 2010-12-30 ENCOUNTER — Encounter: Payer: Self-pay | Admitting: Internal Medicine

## 2010-12-30 NOTE — Telephone Encounter (Signed)
Order for PT/INR was faxed 12/29/2010 to [facility] [phone]

## 2010-12-31 NOTE — Telephone Encounter (Signed)
Call received from Britta Mccreedy Med Tech with [facility] in [place], She reported patient PT-18.9 INR 1.62. Call placed to Debbie at [facility], she was was advised per Sandford Craze to have patient take 5mg   Of Coumadin today, and then Tuesdays & Thursdays, all other days 3 mg, and recheck INR on 01/04/2011. She has verbalized understanding and agrees.

## 2011-01-04 LAB — PROTIME-INR

## 2011-01-04 NOTE — Telephone Encounter (Signed)
I agree with plan

## 2011-01-05 ENCOUNTER — Encounter: Payer: Self-pay | Admitting: Internal Medicine

## 2011-01-05 ENCOUNTER — Telehealth: Payer: Self-pay | Admitting: *Deleted

## 2011-01-05 NOTE — Telephone Encounter (Signed)
Give pt coumadin 5 mg for next 3 days, then resume previous regimen. Order INR in 3 days

## 2011-01-05 NOTE — Telephone Encounter (Signed)
PT 13.8  INR 1.28 yesterday.  Currently on Coumadin 5mg  on Tues and Thursday and 3 mg on all other days. Please advise.

## 2011-01-05 NOTE — Telephone Encounter (Signed)
Order given to Caddell at [facility]. She states they will have to send the pt out on Saturday to have PT/INR check in 3 days unless it would be ok to wait until Monday when they can check it at the facility. Please advise.

## 2011-01-06 NOTE — Telephone Encounter (Signed)
OK to wait until Monday for PT/INR.

## 2011-01-06 NOTE — Telephone Encounter (Signed)
Received call from [place] at [facility]. She states that [place] [facility] is not open on the weekend and they will need a signed lab order to take with pt as they may have to take pt to the hospital to have PT/INR checked. They do PT/INR checks at their facility on Friday and Monday if it would be ok to have it checked on one of these days. She is also faxing a standing order request for pt's PT/INR so they do not have to keep getting individual orders signed each time she needs it checked. The order will be good for 6 months. Please advise re: weekend PT/INR check.

## 2011-01-06 NOTE — Telephone Encounter (Signed)
Standing order signed by Dr Marvel Plan and order faxed to [phone]. Please advise if ok to check INR at facility tomorrow or Monday. If INR check is to be done on Saturday we will need to fax an order to facility for INR to be checked at a hospital.

## 2011-01-07 NOTE — Telephone Encounter (Signed)
Call placed to [facility] [phone], spoke with Geralyn Flash in medical records. Message was left for nursing staff to be advised that patient could wait until Monday to have her PT/INR drawn per [facility]'s instructions

## 2011-01-11 LAB — PROTIME-INR

## 2011-01-12 ENCOUNTER — Telehealth: Payer: Self-pay | Admitting: *Deleted

## 2011-01-12 NOTE — Telephone Encounter (Signed)
Please check once weekly at the facility lab draw.

## 2011-01-12 NOTE — Telephone Encounter (Signed)
Rita Miller with [facility] called and left voice message stating patients  INR on Tuesday was 2.76. She states patient is currently taking Coumadin 3 mg  4 days a week and 5 mg  2 days per week. She would like to know when patient should have her next INR drawn. Lisa's message stated their facility lab draw is Tuesday and Thursday, and [place] facility that they take her to is open Monday to Thursday. She would like to know when patient should have her next  blood draw.

## 2011-01-12 NOTE — Telephone Encounter (Signed)
Call placed to [facility] [phone], spoke with Nilda Riggs was advised to have patient continue with her current Coumadin Regimen, and recheck INR in one week on facility lab draw day. Caddell has verbalized understanding and states that she will forward the information to [place]

## 2011-01-17 ENCOUNTER — Other Ambulatory Visit: Payer: Self-pay | Admitting: Hematology & Oncology

## 2011-01-17 ENCOUNTER — Encounter (HOSPITAL_BASED_OUTPATIENT_CLINIC_OR_DEPARTMENT_OTHER): Payer: Medicare Other | Admitting: Hematology & Oncology

## 2011-01-17 DIAGNOSIS — R928 Other abnormal and inconclusive findings on diagnostic imaging of breast: Secondary | ICD-10-CM

## 2011-01-17 DIAGNOSIS — C50419 Malignant neoplasm of upper-outer quadrant of unspecified female breast: Secondary | ICD-10-CM

## 2011-01-17 DIAGNOSIS — Z853 Personal history of malignant neoplasm of breast: Secondary | ICD-10-CM

## 2011-01-17 DIAGNOSIS — D509 Iron deficiency anemia, unspecified: Secondary | ICD-10-CM

## 2011-01-17 DIAGNOSIS — Z954 Presence of other heart-valve replacement: Secondary | ICD-10-CM

## 2011-01-17 DIAGNOSIS — Z7901 Long term (current) use of anticoagulants: Secondary | ICD-10-CM

## 2011-01-17 DIAGNOSIS — F0390 Unspecified dementia without behavioral disturbance: Secondary | ICD-10-CM

## 2011-01-17 LAB — CBC WITH DIFFERENTIAL (CANCER CENTER ONLY)
BASO#: 0.1 10*3/uL (ref 0.0–0.2)
Eosinophils Absolute: 0.3 10*3/uL (ref 0.0–0.5)
HGB: 10.8 g/dL — ABNORMAL LOW (ref 11.6–15.9)
LYMPH%: 25.7 % (ref 14.0–48.0)
MCH: 29.3 pg (ref 26.0–34.0)
MCV: 89 fL (ref 81–101)
MONO%: 10.5 % (ref 0.0–13.0)
RBC: 3.68 10*6/uL — ABNORMAL LOW (ref 3.70–5.32)

## 2011-01-18 ENCOUNTER — Telehealth: Payer: Self-pay | Admitting: Internal Medicine

## 2011-01-18 LAB — BASIC METABOLIC PANEL
CO2: 19 mEq/L (ref 19–32)
Glucose, Bld: 85 mg/dL (ref 70–99)
Potassium: 4.4 mEq/L (ref 3.5–5.3)
Sodium: 139 mEq/L (ref 135–145)

## 2011-01-18 LAB — RETICULOCYTES (CHCC)
ABS Retic: 30.1 10*3/uL (ref 19.0–186.0)
RBC.: 3.76 MIL/uL — ABNORMAL LOW (ref 3.87–5.11)
Retic Ct Pct: 0.8 % (ref 0.4–3.1)

## 2011-01-18 LAB — IRON AND TIBC: TIBC: 261 ug/dL (ref 250–470)

## 2011-01-18 NOTE — Telephone Encounter (Signed)
Please call [name] or peggy at [facility] regarding pt/inr. Last pt was 30.6 and Inr 2.99. They need to know if they can use standing order or new order?

## 2011-01-19 NOTE — Telephone Encounter (Signed)
Please repeat PT INR tomorrow, call us with results for further orders.

## 2011-01-19 NOTE — Telephone Encounter (Signed)
Call returned to [facility] [phone] with [name]; she stated that received a prn order for patient to be drawn at  [facility] as needed if they are unable to get blood from patient at facility and  A signed standing order for patient to be drawn every Monday.  She was informed per Melissa instructions to draw PT/INR on patient on Thursday and call with results and further instructions. [name] has vebalized understanding and agrees

## 2011-01-20 ENCOUNTER — Telehealth: Payer: Self-pay | Admitting: *Deleted

## 2011-01-20 NOTE — Telephone Encounter (Signed)
No change in coumadin dosing regimen. Recheck PT/INR in 9mo.--PM

## 2011-01-20 NOTE — Telephone Encounter (Signed)
[name] with [facility] called with patient PT 29.4/ INR 2.86. Would like to know if there are any changes in dosing and lab draw. Current dose is 3 mg 4 days per week and 5 mg 2 days per week. She has a standing lab order for Mondays.

## 2011-01-20 NOTE — Telephone Encounter (Signed)
Call returned to [facility] [phone], spoke with [name], she was informed per Dr Milinda Cave instructions, and has verbalized understanding and agrees as instructed

## 2011-01-25 NOTE — Telephone Encounter (Signed)
No further action required

## 2011-01-27 ENCOUNTER — Encounter: Payer: Self-pay | Admitting: Family

## 2011-01-28 ENCOUNTER — Encounter (HOSPITAL_BASED_OUTPATIENT_CLINIC_OR_DEPARTMENT_OTHER): Payer: Medicare Other | Admitting: Hematology & Oncology

## 2011-01-28 DIAGNOSIS — C50419 Malignant neoplasm of upper-outer quadrant of unspecified female breast: Secondary | ICD-10-CM

## 2011-01-28 DIAGNOSIS — D509 Iron deficiency anemia, unspecified: Secondary | ICD-10-CM

## 2011-01-28 DIAGNOSIS — D45 Polycythemia vera: Secondary | ICD-10-CM

## 2011-01-31 ENCOUNTER — Encounter: Payer: Self-pay | Admitting: Internal Medicine

## 2011-02-02 ENCOUNTER — Encounter: Payer: Self-pay | Admitting: Internal Medicine

## 2011-02-07 ENCOUNTER — Encounter: Payer: Self-pay | Admitting: Internal Medicine

## 2011-02-10 ENCOUNTER — Emergency Department (HOSPITAL_COMMUNITY): Payer: Medicare Other

## 2011-02-10 ENCOUNTER — Inpatient Hospital Stay (HOSPITAL_COMMUNITY)
Admission: EM | Admit: 2011-02-10 | Discharge: 2011-02-12 | DRG: 392 | Disposition: A | Discharge status: Nursing Home (Includes ICF) | Payer: Medicare Other | Attending: Emergency Medicine | Admitting: Emergency Medicine

## 2011-02-10 DIAGNOSIS — R5381 Other malaise: Secondary | ICD-10-CM | POA: Diagnosis present

## 2011-02-10 DIAGNOSIS — Z96649 Presence of unspecified artificial hip joint: Secondary | ICD-10-CM

## 2011-02-10 DIAGNOSIS — Z950 Presence of cardiac pacemaker: Secondary | ICD-10-CM

## 2011-02-10 DIAGNOSIS — Z7901 Long term (current) use of anticoagulants: Secondary | ICD-10-CM

## 2011-02-10 DIAGNOSIS — J9610 Chronic respiratory failure, unspecified whether with hypoxia or hypercapnia: Secondary | ICD-10-CM | POA: Diagnosis present

## 2011-02-10 DIAGNOSIS — J449 Chronic obstructive pulmonary disease, unspecified: Secondary | ICD-10-CM | POA: Diagnosis present

## 2011-02-10 DIAGNOSIS — I359 Nonrheumatic aortic valve disorder, unspecified: Secondary | ICD-10-CM | POA: Diagnosis present

## 2011-02-10 DIAGNOSIS — Z85850 Personal history of malignant neoplasm of thyroid: Secondary | ICD-10-CM

## 2011-02-10 DIAGNOSIS — D638 Anemia in other chronic diseases classified elsewhere: Secondary | ICD-10-CM | POA: Diagnosis present

## 2011-02-10 DIAGNOSIS — R112 Nausea with vomiting, unspecified: Principal | ICD-10-CM | POA: Diagnosis present

## 2011-02-10 DIAGNOSIS — Z886 Allergy status to analgesic agent status: Secondary | ICD-10-CM

## 2011-02-10 DIAGNOSIS — F0390 Unspecified dementia without behavioral disturbance: Secondary | ICD-10-CM | POA: Diagnosis present

## 2011-02-10 DIAGNOSIS — I4891 Unspecified atrial fibrillation: Secondary | ICD-10-CM | POA: Diagnosis present

## 2011-02-10 DIAGNOSIS — J4489 Other specified chronic obstructive pulmonary disease: Secondary | ICD-10-CM | POA: Diagnosis present

## 2011-02-10 DIAGNOSIS — Z853 Personal history of malignant neoplasm of breast: Secondary | ICD-10-CM

## 2011-02-10 DIAGNOSIS — E039 Hypothyroidism, unspecified: Secondary | ICD-10-CM | POA: Diagnosis present

## 2011-02-10 DIAGNOSIS — I10 Essential (primary) hypertension: Secondary | ICD-10-CM | POA: Diagnosis present

## 2011-02-10 DIAGNOSIS — Z9981 Dependence on supplemental oxygen: Secondary | ICD-10-CM

## 2011-02-10 DIAGNOSIS — E785 Hyperlipidemia, unspecified: Secondary | ICD-10-CM | POA: Diagnosis present

## 2011-02-10 LAB — CBC
MCHC: 33.8 g/dL (ref 30.0–36.0)
Platelets: 234 10*3/uL (ref 150–400)
RDW: 19.1 % — ABNORMAL HIGH (ref 11.5–15.5)
WBC: 8.8 10*3/uL (ref 4.0–10.5)

## 2011-02-10 LAB — COMPREHENSIVE METABOLIC PANEL
AST: 44 U/L — ABNORMAL HIGH (ref 0–37)
Albumin: 3.7 g/dL (ref 3.5–5.2)
CO2: 22 mEq/L (ref 19–32)
Calcium: 9.5 mg/dL (ref 8.4–10.5)
Creatinine, Ser: 0.93 mg/dL (ref 0.50–1.10)
GFR calc non Af Amer: 58 mL/min — ABNORMAL LOW (ref >60)
Sodium: 134 mEq/L — ABNORMAL LOW (ref 135–145)
Total Protein: 8 g/dL (ref 6.0–8.3)

## 2011-02-10 LAB — DIFFERENTIAL
Basophils Absolute: 0 10*3/uL (ref 0.0–0.1)
Basophils Relative: 1 % (ref 0–1)
Eosinophils Absolute: 0.2 10*3/uL (ref 0.0–0.7)
Eosinophils Relative: 2 % (ref 0–5)
Monocytes Absolute: 0.5 10*3/uL (ref 0.1–1.0)

## 2011-02-10 LAB — CK TOTAL AND CKMB (NOT AT ARMC): CK, MB: 3.1 ng/mL (ref 0.3–4.0)

## 2011-02-10 LAB — LACTIC ACID, PLASMA: Lactic Acid, Venous: 0.8 mmol/L (ref 0.5–2.2)

## 2011-02-10 LAB — URINALYSIS, ROUTINE W REFLEX MICROSCOPIC
Bilirubin Urine: NEGATIVE
Ketones, ur: NEGATIVE mg/dL
Leukocytes, UA: NEGATIVE
Nitrite: NEGATIVE
Specific Gravity, Urine: 1.017 (ref 1.005–1.030)
Urobilinogen, UA: 1 mg/dL (ref 0.0–1.0)

## 2011-02-10 LAB — PROTIME-INR
INR: 3.03 — ABNORMAL HIGH (ref 0.00–1.49)
Prothrombin Time: 31.9 seconds — ABNORMAL HIGH (ref 11.6–15.2)

## 2011-02-10 LAB — TROPONIN I: Troponin I: <0.3 ng/mL (ref <0.30)

## 2011-02-10 LAB — POCT I-STAT 3, ART BLOOD GAS (G3+)
Bicarbonate: 22.6 mEq/L (ref 20.0–24.0)
O2 Saturation: 80 %
Patient temperature: 98.6
pO2, Arterial: 40 mmHg — ABNORMAL LOW (ref 80.0–100.0)

## 2011-02-11 ENCOUNTER — Telehealth: Payer: Self-pay | Admitting: *Deleted

## 2011-02-11 LAB — COMPREHENSIVE METABOLIC PANEL
ALT: 22 U/L (ref 0–35)
CO2: 25 mEq/L (ref 19–32)
Calcium: 9.1 mg/dL (ref 8.4–10.5)
Creatinine, Ser: 0.82 mg/dL (ref 0.50–1.10)
GFR calc Af Amer: >60 mL/min (ref >60)
GFR calc non Af Amer: >60 mL/min (ref >60)
Glucose, Bld: 99 mg/dL (ref 70–99)
Sodium: 138 mEq/L (ref 135–145)

## 2011-02-11 LAB — URINALYSIS, MICROSCOPIC ONLY
Ketones, ur: NEGATIVE mg/dL
Protein, ur: NEGATIVE mg/dL
Urobilinogen, UA: 1 mg/dL (ref 0.0–1.0)

## 2011-02-11 LAB — CBC
Platelets: 227 10*3/uL (ref 150–400)
RBC: 3.65 MIL/uL — ABNORMAL LOW (ref 3.87–5.11)
WBC: 7.8 10*3/uL (ref 4.0–10.5)

## 2011-02-11 LAB — URINE CULTURE
Colony Count: NO GROWTH
Culture  Setup Time: 201207052023

## 2011-02-11 LAB — PROTIME-INR: INR: 2.65 — ABNORMAL HIGH (ref 0.00–1.49)

## 2011-02-11 LAB — DIFFERENTIAL
Basophils Absolute: 0 10*3/uL (ref 0.0–0.1)
Basophils Relative: 0 % (ref 0–1)
Eosinophils Absolute: 0.2 10*3/uL (ref 0.0–0.7)
Neutrophils Relative %: 75 % (ref 43–77)

## 2011-02-11 LAB — FERRITIN: Ferritin: 522 ng/mL — ABNORMAL HIGH (ref 10–291)

## 2011-02-11 LAB — CARDIAC PANEL(CRET KIN+CKTOT+MB+TROPI)
CK, MB: 3.1 ng/mL (ref 0.3–4.0)
Relative Index: INVALID (ref 0.0–2.5)
Total CK: 69 U/L (ref 7–177)
Total CK: 68 U/L (ref 7–177)

## 2011-02-11 LAB — APTT: aPTT: 56 seconds — ABNORMAL HIGH (ref 24–37)

## 2011-02-11 NOTE — H&P (Signed)
NAMEGIOIA, Rita Miller NO.:  [id number]  MEDICAL RECORD NO.:  [id number]  LOCATION:  [facility]                         FACILITY:  [facility]  PHYSICIAN:  Talmage Nap, MD  DATE OF BIRTH:  Apr 11, 1930  DATE OF ADMISSION:  02/10/2011 DATE OF DISCHARGE:                             HISTORY & PHYSICAL   PRIMARY CARE PHYSICIAN:  Dr. Thomos Lemons.  History obtainable from the patient (not a very good historian).  CHIEF COMPLAINT:  Nausea and abdominal pain of unspecified duration.  The patient is a 77 year old Caucasian female with history of atrial fibrillation status post pacemaker on anticoagulation with Coumadin, also has mitral valvuloplasty and a resident of the nursing home was brought from the nursing home because of persistent nausea with abdominal pain.  There was however no history of fever.  There was no history of chills.  No rigor.  The patient was also said to have been complained about wanting to die.  There is no history of diarrhea or hematochezia.  No dysuria or hematuria and subsequently the patient was brought to the emergency room for evaluation.  PAST MEDICAL HISTORY:  Positive for atrial fibrillation, COPD, chronic hypoxemia on home O2, anemia, severe aortic stenosis, peripheral vascular disease, hypothyroidism, dementia, hypertension, hyperlipidemia and a history of breast CA.  PAST SURGICAL HISTORY: 1. Atrial fibrillation status post a pacemaker. 2. Thyroid CA status post thyroidectomy. 3. Hysterectomy. 4. Total hip replacement. 5. Breast CA status post mastectomy. 6. Mitral valvuloplasty. 7. Left cataract surgery status post implant.  PREADMISSION MEDICATIONS WITHOUT DOSAGES: 1. Oxygen. 2. Cardizem. 3. Coumadin. 4. Cozaar. 5. Crestor. 6. Diovan. 7. Exelon TD. 8. Flovent inhaler. 9. Melatonin. 10.MiraLax. 11.Namenda. 12.Resource 2.0 oral. 13.Saline nasal spray. 14.Spiriva with HandiHaler. 15.Synthroid. 16.Ventolin  inhaler. 17.Vitamin E and D alpha oral. 18.Zofran.  ALLERGIES:  To MORPHINE and PERCOCET.  SOCIAL HISTORY:  Negative for alcohol, tobacco use and she is a resident of the nursing home.  FAMILY HISTORY:  Positive for breast CA, diabetes mellitus, hypertension and hyperlipidemia.  REVIEW OF SYSTEMS:  The patient denies any history of headaches, complained of nausea but no vomiting.  She denies any fever, chills or rigor.  Complained about mild precordial discomfort.  The patient is short of breath on home O2.  She complained of abdominal discomfort mainly located in the epigastric region.  Denies any diarrhea or hematochezia.  No dysuria or hematuria.  No swelling of the lower extremity.  No intolerance to heat or cold and no neuropsychiatric disorder.  PHYSICAL EXAMINATION:  GENERAL:  An elderly lady on O2 via nasal cannula with suboptimal hydration. VITAL SIGNS:  At present blood pressure is 170/60, pulse is 65, respiratory rate 20, temperature 96.6. HEENT:  Pallor, but pupils are reactive to light and extraocular muscles are intact. NECK:  No jugular venous distention.  No carotid bruit.  No lymphadenopathy. CHEST:  Decreased breath sounds globally with minimal rhonchi and pacemaker on the right hemithorax. CARDIOVASCULAR:  S1 and S2 with holosystolic murmur maximal at the mitral area as well as the aortic area. ABDOMEN:  Scaphoid with tenderness in the epigastric region.  Liver, spleen and kidney not palpable.  Bowel sounds are positive. EXTREMITIES:  No pedal edema. NEUROLOGIC:  Nonfocal. NEUROPSYCHIATRIC:  Unremarkable. SKIN:  Decreased turgor. MUSCULOSKELETAL:  Arthritic changes in the knees and in the feet.  LABORATORY DATA:  Initial complete blood count differential showed WBC of 8.8, hemoglobin 11.2, hematocrit 33.1, MCV of 88.7, platelet count of 234, neutrophils 82% and absolute neutrophil count is 7.2.  Coagulation profile showed PT of 31.9, INR 3.03.   Comprehensive metabolic panel showed sodium of 134, potassium of 3.6, chloride of 101 with a bicarb of 22.  Glucose is 156, BUN is 53, creatinine 0.93.  LFTs showed total bilirubin 0.5, alkaline phosphatase 102, AST 44, ALT 28.  Lipase 20 normal.  First set of cardiac markers troponin-I less than 0.30.  Lactic acid level 0.8 normal.  Urinalysis unremarkable.  Venous blood gas showed pH of 7.48, pCO2 of 30.0, pO2 of 40.  Bicarb is 22.6.  EKG showed AFib/flutter with ventricular paced rhythm.  Imaging studies done include chest x-ray which showed no active infiltrate, however there is 9 mm cardiac nodule.  IMPRESSION: 1. Abdominal pain with nausea, questionable secondary to gastritis and     also questionable secondary to peptic ulcer disease. 2. Atrial fibrillation status post pacemaker. 3. Chronic obstructive pulmonary disease. 4. Chronic hypoxemia (chronic respiratory failure on home O2). 5. Severe aortic stenosis. 6. Hypertension. 7. Hyperlipidemia. 8. Hypothyroidism. 9. Dementia. 10.Deconditioning.  PLAN:  To admit the patient to telemetry.  The patient will continue O2 via nasal cannula 3 L per minute.  She will be rehydrated with half- normal saline IV to go at a rate of 65 mL an hour and pain control be done with Dilaudid 1 mg IV q.4 p.r.n.  She will also be on Protonix 40 mg IV q.24 and Zofran 4 mg IV q.4 p.r.n.  Other medications to be given to the patient will include albuterol and Atrovent nebs q.4 p.r.n. for shortness of breath.  Ventricular rate to be controlled with diltiazem 240 mg p.o. daily.  Blood pressure will be controlled with losartan 50 mg p.o. daily.  She will be restarted on Crestor 20 mg p.o. daily for her hyperlipidemia.  For her dementia, the patient will be restarted on Exelon and Namenda and for hypothyroidism, she will be on Synthroid 50 mcg p.o. daily.  Anticoagulation with Coumadin dosing will be done by pharmacy.  Further workup to be done on this  patient will include cardiac enzymes q.6 x3.  CBC, CMP and magnesium will be repeated in a.m. PT, PTT and INR will also be repeated and the patient will be followed and evaluated on day-to-day basis.     Talmage Nap, MD     CN/MEDQ  D:  02/10/2011  T:  02/10/2011  Job:  [id number]  Electronically Signed by Talmage Nap  on 02/11/2011 06:06:04 AM

## 2011-02-11 NOTE — Telephone Encounter (Signed)
Call placed [facility] at [phone]. Spoke with [name] regarding patient medication reconciliation sheet requesting physician signature. She was informed the cover provider has requested a corrected-current copy of patients medications. The current sheet received shows that patient is taking both Losartan and Diovan, and does not state nor show that medication has been discontinued. She was informed in order for physician to sign off on sheet, the medication sheet must reflect only what patient is currently taking.She was advised to re-fax a current corrected copy of patient medications. [name] has verbalized understanding and agrees as instructed

## 2011-02-16 ENCOUNTER — Telehealth: Payer: Self-pay | Admitting: Family Medicine

## 2011-02-16 NOTE — Telephone Encounter (Signed)
Would maintain same dosing schedule but have them recheck his PT/INR on Friday for further instructions

## 2011-02-16 NOTE — Telephone Encounter (Signed)
Received call from Rita Miller at [facility] stating pt just returned to their facility from the hospital on Friday. Pt's coumadin was on hold Saturday and Sunday and resumed on Monday. PT--18.3  INR-- 1.73 from 02-15-11. Current dose Coumadin is 5mg  on Tues and Thurs, 3mg  all other days. Please advise re: dosing changes and next PT/INR check.

## 2011-02-16 NOTE — Telephone Encounter (Signed)
Notified Lynnell Dike. She states their lab draw days are Mondays and Thursdays. Can they wait and recheck it on Monday? Also states that at one time Dr Artist Pais had signed a standing order to have her PT/INR checked every Monday each week and that was changed and got off due to some hospital admissions. Do you want pt to resume standing order of once a week (Monday) PT/INR check. She states she has faxed hospital d/c and medication list for signature and return also.

## 2011-02-17 NOTE — Telephone Encounter (Signed)
Ok to recheck on Monday.

## 2011-02-17 NOTE — Telephone Encounter (Signed)
Rita Miller to recheck PT/INR on Monday.

## 2011-02-21 NOTE — Discharge Summary (Signed)
  NAMEGAYNOR, Rita Miller               ACCOUNT NO.:  [id number]  MEDICAL RECORD NO.:  [id number]  LOCATION:  [facility]                         FACILITY:  [facility]  PHYSICIAN:  Anessia Oakland, DO         DATE OF BIRTH:  Mar 02, 1930  DATE OF ADMISSION:  02/10/2011 DATE OF DISCHARGE:  02/12/2011                              DISCHARGE SUMMARY   ADMISSION DIAGNOSES: 1. Nausea and vomiting. 2. Atrial fibrillation. 3. Chronic obstructive pulmonary disease. 4. Chronic hypoxemia. 5. Severe aortic stenosis. 6. Hypertension. 7. Hyperlipidemia. 8. Hypothyroidism. 9. Dementia and deconditioning.  HISTORY OF PRESENT ILLNESS:  Please see H and P.  HOSPITAL COURSE:  The patient was given IV fluids and antiemetics.  The patient's INR was monitored, as she was on chronic Coumadin.  Today, her INR is 3.48.  The patient's nausea and vomiting has resolved.  She has been tolerating her diet well.  Her rate is controlled.  INR is little high and I believe she can return to the nursing facility today.  DISCHARGE DIAGNOSES: 1. Nausea and vomiting, which has resolved. 2. Chronic anemia. 3. Atrial fibrillation, rate controlled and the patient is on     anticoagulation. 4. Chronic obstructive pulmonary disease with chronic respiratory     failure, on home O2. 5. Hypertension.  DISCHARGE MEDICATIONS: 1. Synthroid 50 mcg one p.o. daily. 2. Memantine 10 mg one p.o. daily. 3. Prilosec 20 mg one p.o. daily. 4. Rosuvastatin 20 mg one p.o. daily at bedtime. 5. I want the patient to hold her Coumadin until February 14, 2011.  At     that time, she may resume her usual schedule as 3 mg one p.o. every     Monday, Wednesday, Friday, Saturday, and Sunday and 5 mg every     Tuesday and Thursday. 6. Cozaar 25 mg one p.o. daily. 7. Diltiazem 240 mg one p.o. daily. 8. Diovan 40 mg one p.o. daily. 9. Exelon 9.5 mg patch one topically daily. 10.Flovent 2 puffs by mouth daily. 11.Hydrocodone/acetaminophen 5/500 one half p.o.  twice daily as needed     for pain. 12.MiraLax one capsule by mouth daily. 13.Neo-Synephrine 2 sprays each nostril every 12 hours as needed for     nasal congestion. 14.Zofran 8 mg one p.o.q.4h. as needed for nausea. 15.Spiriva 18 mcg one capsule inhaled daily. 16.Vitamin E 400 international units by mouth daily.  The patient is to have a PT/INR drawn on February 14, 2011 and report it to Dr. Artist Pais.  She is to follow up with Dr. Artist Pais in 2 to 4 weeks.  I have spent over 30 minutes on this discharge.          ______________________________ Fran Lowes, DO     AS/MEDQ  D:  02/12/2011  T:  02/12/2011  Job:  [id number]  cc:   Barbette Hair. Artist Pais, DO  Electronically Signed by Fran Lowes DO on 02/21/2011 01:38:41 PM

## 2011-02-22 ENCOUNTER — Telehealth: Payer: Self-pay | Admitting: *Deleted

## 2011-02-22 NOTE — Telephone Encounter (Signed)
Received fax from [facility] re: PT / INR result from 02/21/11. PT is 19.5 and INR is 1.85. Current coumadin dose per Nursing Home is 5mg  on Tuesday and Thursday and 3mg  on all other days. Please advise re: dose adjustment and next PT/INR check.

## 2011-02-22 NOTE — Telephone Encounter (Signed)
Was supratherapeutic at hospital. Recommend continue same dose. Recheck 7-10 days.

## 2011-02-23 NOTE — Telephone Encounter (Signed)
Order given to Archie Patten, Charity fundraiser at [facility].

## 2011-02-25 ENCOUNTER — Telehealth: Payer: Self-pay | Admitting: Internal Medicine

## 2011-02-25 NOTE — Telephone Encounter (Signed)
Received call from [place] at [facility] that pt is complaining of burning with urination. They request order for urinalysis with [facility]. Verbal authorization given.

## 2011-02-25 NOTE — Telephone Encounter (Signed)
Yes pls

## 2011-03-03 ENCOUNTER — Telehealth: Payer: Self-pay | Admitting: *Deleted

## 2011-03-03 NOTE — Telephone Encounter (Signed)
Received call from Emmit Alexanders. @ [facility] re: PT--53.1  INR--5.35 checked today.   Pt's current Coumadin dose is 5mg  on Tuesdays and Thursdays and 3mg  on all other days. Pt has not recently been on antibiotics. Urine cultured showed no growth but urinalysis reported 3+ bacteria. [name] has faxed the urine results to [place]. Please advise re: PT/INR.

## 2011-03-03 NOTE — Telephone Encounter (Signed)
Notified [name] at [facility] re: instructions below.

## 2011-03-03 NOTE — Telephone Encounter (Signed)
Hold coumadin x 4 days then 3mg  po qd. Recheck Monday. Monitor for s/s of bleeding.

## 2011-03-04 ENCOUNTER — Emergency Department (HOSPITAL_COMMUNITY): Payer: Medicare Other

## 2011-03-04 ENCOUNTER — Emergency Department (HOSPITAL_COMMUNITY)
Admission: EM | Admit: 2011-03-04 | Discharge: 2011-03-04 | Disposition: A | Discharge status: Home / Self Care | Payer: Medicare Other | Attending: Emergency Medicine | Admitting: Emergency Medicine

## 2011-03-04 ENCOUNTER — Encounter (HOSPITAL_COMMUNITY): Payer: Self-pay | Admitting: Radiology

## 2011-03-04 DIAGNOSIS — J4489 Other specified chronic obstructive pulmonary disease: Secondary | ICD-10-CM | POA: Insufficient documentation

## 2011-03-04 DIAGNOSIS — Z9981 Dependence on supplemental oxygen: Secondary | ICD-10-CM | POA: Insufficient documentation

## 2011-03-04 DIAGNOSIS — I4891 Unspecified atrial fibrillation: Secondary | ICD-10-CM | POA: Insufficient documentation

## 2011-03-04 DIAGNOSIS — Z7901 Long term (current) use of anticoagulants: Secondary | ICD-10-CM | POA: Insufficient documentation

## 2011-03-04 DIAGNOSIS — I739 Peripheral vascular disease, unspecified: Secondary | ICD-10-CM | POA: Insufficient documentation

## 2011-03-04 DIAGNOSIS — J449 Chronic obstructive pulmonary disease, unspecified: Secondary | ICD-10-CM | POA: Insufficient documentation

## 2011-03-04 DIAGNOSIS — Z7982 Long term (current) use of aspirin: Secondary | ICD-10-CM | POA: Insufficient documentation

## 2011-03-04 DIAGNOSIS — N390 Urinary tract infection, site not specified: Secondary | ICD-10-CM | POA: Insufficient documentation

## 2011-03-04 DIAGNOSIS — F068 Other specified mental disorders due to known physiological condition: Secondary | ICD-10-CM | POA: Insufficient documentation

## 2011-03-04 DIAGNOSIS — Z853 Personal history of malignant neoplasm of breast: Secondary | ICD-10-CM | POA: Insufficient documentation

## 2011-03-04 DIAGNOSIS — E785 Hyperlipidemia, unspecified: Secondary | ICD-10-CM | POA: Insufficient documentation

## 2011-03-04 DIAGNOSIS — Z79899 Other long term (current) drug therapy: Secondary | ICD-10-CM | POA: Insufficient documentation

## 2011-03-04 DIAGNOSIS — R0602 Shortness of breath: Secondary | ICD-10-CM | POA: Insufficient documentation

## 2011-03-04 HISTORY — DX: Malignant (primary) neoplasm, unspecified: C80.1

## 2011-03-04 LAB — URINE MICROSCOPIC-ADD ON

## 2011-03-04 LAB — POCT I-STAT, CHEM 8
Calcium, Ion: 1.14 mmol/L (ref 1.12–1.32)
Creatinine, Ser: 1.1 mg/dL (ref 0.50–1.10)
Glucose, Bld: 99 mg/dL (ref 70–99)
Hemoglobin: 12.6 g/dL (ref 12.0–15.0)
TCO2: 22 mmol/L (ref 0–100)

## 2011-03-04 LAB — CK TOTAL AND CKMB (NOT AT ARMC)
CK, MB: 2.2 ng/mL (ref 0.3–4.0)
CK, MB: 2 ng/mL (ref 0.3–4.0)
Total CK: 46 U/L (ref 7–177)

## 2011-03-04 LAB — PROTIME-INR: Prothrombin Time: 38.4 seconds — ABNORMAL HIGH (ref 11.6–15.2)

## 2011-03-04 LAB — DIFFERENTIAL
Basophils Absolute: 0 10*3/uL (ref 0.0–0.1)
Basophils Relative: 0 % (ref 0–1)
Eosinophils Absolute: 0.1 10*3/uL (ref 0.0–0.7)
Eosinophils Relative: 1 % (ref 0–5)
Lymphocytes Relative: 3 % — ABNORMAL LOW (ref 12–46)
Monocytes Absolute: 0.6 10*3/uL (ref 0.1–1.0)

## 2011-03-04 LAB — URINALYSIS, ROUTINE W REFLEX MICROSCOPIC
Glucose, UA: NEGATIVE mg/dL
Ketones, ur: NEGATIVE mg/dL
Protein, ur: 100 mg/dL — AB

## 2011-03-04 LAB — CBC
HCT: 35.8 % — ABNORMAL LOW (ref 36.0–46.0)
MCHC: 33.2 g/dL (ref 30.0–36.0)
RDW: 17.9 % — ABNORMAL HIGH (ref 11.5–15.5)

## 2011-03-04 LAB — POCT I-STAT 3, ART BLOOD GAS (G3+)
Bicarbonate: 23.8 mEq/L (ref 20.0–24.0)
O2 Saturation: 99 %
TCO2: 25 mmol/L (ref 0–100)
pO2, Arterial: 134 mmHg — ABNORMAL HIGH (ref 80.0–100.0)

## 2011-03-04 MED ORDER — IOHEXOL 300 MG/ML  SOLN
80.0000 mL | Freq: Once | INTRAMUSCULAR | Status: AC | PRN
  Administered 2011-03-04: 80 mL via INTRAVENOUS

## 2011-03-06 LAB — URINE CULTURE: Culture  Setup Time: 201207271410

## 2011-03-07 LAB — PROTIME-INR

## 2011-03-09 ENCOUNTER — Telehealth: Payer: Self-pay | Admitting: *Deleted

## 2011-03-09 NOTE — Telephone Encounter (Signed)
Message copied by Glendell Docker on Wed Mar 09, 2011  2:36 PM ------      Message from: Staci Righter.      Created: Wed Mar 09, 2011  1:48 PM       inr 1.42 (was above 5 last check). Taking 3mg  po qd of coumadin.       Change to 5mg  on weds and 3mg  all other days. Recheck in 6 days

## 2011-03-09 NOTE — Telephone Encounter (Signed)
Call placed to [facility]  [phone], spoke with Lawanna Kobus she was advised per Dr Rodena Medin instruction.

## 2011-03-10 ENCOUNTER — Telehealth: Payer: Self-pay | Admitting: Internal Medicine

## 2011-03-10 MED ORDER — HYDROCODONE-ACETAMINOPHEN 2.5-500 MG PO TABS: ORAL_TABLET | ORAL | Status: DC

## 2011-03-10 NOTE — Telephone Encounter (Signed)
Refill called to [place] at [facility]. #30 x 2 refills.

## 2011-03-10 NOTE — Telephone Encounter (Signed)
Repeat previous quantity and rf 2

## 2011-03-10 NOTE — Telephone Encounter (Signed)
Hydroco/apap tab 2.5-500. Take one tablet by mouth twice a day as needed for pain.   Note: current prescription for your patient has no refills remaining or is about to expire.

## 2011-03-10 NOTE — Telephone Encounter (Signed)
Pt last seen in April. Please advise re: refill quantity and number of refills.

## 2011-03-14 ENCOUNTER — Telehealth: Payer: Self-pay | Admitting: *Deleted

## 2011-03-14 NOTE — Telephone Encounter (Signed)
Call placed to [facility]  [phone], spoke with [name]. She was advised per Dr Rodena Medin instruction.

## 2011-03-14 NOTE — Telephone Encounter (Signed)
Message copied by Glendell Docker on Mon Mar 14, 2011  5:17 PM ------      Message from: Staci Righter.      Created: Mon Mar 14, 2011  5:10 PM       INR 1.75 on coumadin 5mg  wed and 3mg  other days      Same dose recheck 1 week

## 2011-03-15 ENCOUNTER — Encounter: Payer: Self-pay | Admitting: Internal Medicine

## 2011-03-22 ENCOUNTER — Telehealth: Payer: Self-pay | Admitting: *Deleted

## 2011-03-22 MED ORDER — WARFARIN SODIUM 3 MG PO TABS: 3.0000 mg | ORAL_TABLET | Freq: Every day | ORAL | Status: DC

## 2011-03-22 NOTE — Telephone Encounter (Signed)
Fax received from [facility] with patients lab results.  PT 30.4/ INR 2.97  Call placed to [facility] at [phone], spoke with Talbert Forest she stated hall nurse was not available. Message was left for a return call , on how patient is currently taking her Coumadin.

## 2011-03-22 NOTE — Telephone Encounter (Signed)
[name] with [facility] returned phone call, and left voice message stating patients current Coumadin dose is 5 mg on Wednesdays and 3 mg on all other days. Her message states patient has not had any bleeding issues and seems to being fine. [name] would like to know if there are any changes in Coumadin dosing and when the next lab draw for patient will be.

## 2011-03-22 NOTE — Telephone Encounter (Signed)
Resume 3mg  daily. Recheck one week

## 2011-03-22 NOTE — Telephone Encounter (Signed)
Call placed to [facility] [phone],[phone] with  Eunice Blase she was informed per Dr Rodena Medin instruction and has verbalized understanding.

## 2011-03-23 ENCOUNTER — Other Ambulatory Visit: Payer: Self-pay | Admitting: Family

## 2011-03-23 ENCOUNTER — Encounter (HOSPITAL_BASED_OUTPATIENT_CLINIC_OR_DEPARTMENT_OTHER): Payer: Medicare Other | Admitting: Hematology & Oncology

## 2011-03-23 DIAGNOSIS — Z7901 Long term (current) use of anticoagulants: Secondary | ICD-10-CM

## 2011-03-23 DIAGNOSIS — Z954 Presence of other heart-valve replacement: Secondary | ICD-10-CM

## 2011-03-23 DIAGNOSIS — D509 Iron deficiency anemia, unspecified: Secondary | ICD-10-CM

## 2011-03-23 DIAGNOSIS — C50419 Malignant neoplasm of upper-outer quadrant of unspecified female breast: Secondary | ICD-10-CM

## 2011-03-23 LAB — CBC WITH DIFFERENTIAL (CANCER CENTER ONLY)
BASO#: 0.1 10*3/uL (ref 0.0–0.2)
EOS%: 3 % (ref 0.0–7.0)
HCT: 35.3 % (ref 34.8–46.6)
HGB: 12.1 g/dL (ref 11.6–15.9)
MCH: 32.3 pg (ref 26.0–34.0)
MCHC: 34.3 g/dL (ref 32.0–36.0)
MONO%: 10.5 % (ref 0.0–13.0)
NEUT%: 63.1 % (ref 39.6–80.0)

## 2011-03-29 ENCOUNTER — Encounter: Payer: Self-pay | Admitting: Internal Medicine

## 2011-03-29 ENCOUNTER — Telehealth: Payer: Self-pay | Admitting: *Deleted

## 2011-03-29 ENCOUNTER — Ambulatory Visit: Payer: Medicare Other | Admitting: Internal Medicine

## 2011-03-29 NOTE — Telephone Encounter (Signed)
Hold today's dose of coumadin. Monitor for bleeding. Take 3mg  po qd except wed take 1/2 tablet. Recheck one week

## 2011-03-29 NOTE — Telephone Encounter (Signed)
Call placed to [facility] at [phone]. Spoke with [name] , she was informed per Dr Rodena Medin instruction,and has verbalized understanding.   [name] relayed patients coumadin dose has already been given for today. She was advised to hold Wednesdays dose and have patient take 1/2 or 1.5 mg on Thursdays. All other days she is to take 3 mg and recheck PT/INR in one week.

## 2011-03-29 NOTE — Telephone Encounter (Signed)
[name] with [name] call to report patients PT/INR results form 03/28/2011.  PT 36.2 and INR 3.57.  Current coumadin dose is 3mg .

## 2011-04-01 ENCOUNTER — Ambulatory Visit: Payer: Medicare Other | Admitting: Vascular Surgery

## 2011-04-06 ENCOUNTER — Telehealth: Payer: Self-pay | Admitting: *Deleted

## 2011-04-06 NOTE — Telephone Encounter (Signed)
Notified [place] at [facility].

## 2011-04-06 NOTE — Telephone Encounter (Signed)
-  hold coumadin dose today. -change dosing to 3mg  daily except 1.5mg  on thurs and sat. -recheck 1 wk -may use nasal saline spray bid for nose -notify if any worsening of nosebleeds or other bleeding sources.

## 2011-04-06 NOTE — Telephone Encounter (Signed)
Incoming fax for PT/INR results from 04/04/2011. Facility reports patient is having small nose bleeds  PT 31.9  INR 3.12

## 2011-04-15 ENCOUNTER — Telehealth: Payer: Self-pay | Admitting: *Deleted

## 2011-04-15 NOTE — Telephone Encounter (Signed)
Call placed  To [facility], spoke with [name]  [facility]. She stated she has received the message regarding the lab results for the PT/INR. She was informed that Dr Rodena Medin is releasing care to the Provider covering for the facility. [name] stated that Dr. Elby Showers" is more than willing to cover the patient. It was at the request of the patients family that the care be continued by an outside provider. [name] stated that she will contact the patients daughter [name] and make her aware.

## 2011-04-15 NOTE — Telephone Encounter (Signed)
Call placed to [facility], spoke with receptionist  regarding PT/INR results. Lab results receive PT 23.1, INR 2.22. after holding the line for 10 minutes, message was left with receptionist informing per Dr Rodena Medin advised to continue Coumadin at 3 mg and recheck labs in 7-10 days. She was advised to have  Debbie contact office if there are any additional questions

## 2011-04-18 ENCOUNTER — Telehealth: Payer: Self-pay | Admitting: Internal Medicine

## 2011-04-18 ENCOUNTER — Encounter: Payer: Self-pay | Admitting: Internal Medicine

## 2011-04-18 ENCOUNTER — Ambulatory Visit (INDEPENDENT_AMBULATORY_CARE_PROVIDER_SITE_OTHER): Payer: Medicare Other | Admitting: Internal Medicine

## 2011-04-18 VITALS — BP 100/60 | HR 69 | Temp 97.6°F | Resp 18 | Wt 104.0 lb

## 2011-04-18 DIAGNOSIS — E785 Hyperlipidemia, unspecified: Secondary | ICD-10-CM

## 2011-04-18 DIAGNOSIS — E039 Hypothyroidism, unspecified: Secondary | ICD-10-CM

## 2011-04-18 DIAGNOSIS — I350 Nonrheumatic aortic (valve) stenosis: Secondary | ICD-10-CM

## 2011-04-18 DIAGNOSIS — I359 Nonrheumatic aortic valve disorder, unspecified: Secondary | ICD-10-CM

## 2011-04-18 NOTE — Telephone Encounter (Signed)
PATIENT DAUGHTER PLANNED TO BE PRESENT FOR THE APPT TODAY.  SHE CAME DOWN WITH THE FLU AND IS UNABLE TO COME.  SHE WOULD LIKE DR HODGIN TO CALL HER TO DISCUSS MOVING HER MOTHERS CARE AWAY FROM [place] AND TO THE DOCTOR AT THE HOME.  THIS IS NOT WHAT THE DAUGHTER WANTS.  AND IF DR HODGIN WILL NOT KEEP MOM AS A PATIENT WILL DR YOO TAKE HER BACK AT THE [place] LOCATION

## 2011-04-18 NOTE — Telephone Encounter (Signed)
Happy to see Rita Miller in clinic. Was concerned q30 day visits to clinic would be difficult for her compared to being seen by facility physician.  Talked with facility nurse and she'll check how often they require visits. Suspect 60-90 days.

## 2011-04-18 NOTE — Telephone Encounter (Signed)
Call placed to patients daughter [phone], she was informed per Dr Rodena Medin instructions. She stated that she does not want her mother to be seen outside of [facility],and that she will follow up with the facility on how often the patient will need evaluation and call back.

## 2011-04-20 ENCOUNTER — Encounter: Payer: Self-pay | Admitting: Vascular Surgery

## 2011-04-20 ENCOUNTER — Telehealth: Payer: Self-pay | Admitting: *Deleted

## 2011-04-20 MED ORDER — LEVOTHYROXINE SODIUM 75 MCG PO TABS: 75.0000 ug | ORAL_TABLET | Freq: Every day | ORAL | Status: DC

## 2011-04-20 NOTE — Telephone Encounter (Signed)
Fax receive from [facility] regarding patient thyroid results.  Current dose of Synthroid reported 50 mcg daily- pulse wkly "68" TSH 5.60 T4 FREE 0.9  Dr Rodena Medin advised if patient taking 50 mcg daily then increase dose to 75 mcg and check TSH/Free T4 in 10 weeks. Information Faxed to [phone] to attention of [name].

## 2011-04-23 NOTE — Progress Notes (Signed)
  Subjective:    Patient ID: Rita Miller, female    DOB: 06-10-1930, 77 y.o.   MRN: [id number]  HPI Pt presents to clinic for followup of multiple medical problems. H/o breast soreness with previous unsuccessful attempt at bx. Pt could not tolerate. Was recommended for followup US. Pt reportedly nl followed by H/O who recently performed breast US. No results available. H/o AS without syncope or cp. Tolerates statin tx without myalgias or abn lft. No active complaints.  Past Medical History  Diagnosis Date  . CAD (coronary artery disease)   . History of CVA (cerebrovascular accident)   . Atrial fibrillation     peristent  . Hyperlipemia   . COPD (chronic obstructive pulmonary disease)   . Epistaxis     recurrent  . Osteoporosis   . Hypothyroidism   . History of pulmonary embolism   . History of colonic diverticulitis   . History of breast cancer   . Asthma   . Iron deficiency anemia   . History of transfusion of whole blood   . LBBB (left bundle branch block)     history of  . Aortal stenosis     severe  . Pacemaker     bradycardia s/p pacemaker implant  . PAD (peripheral artery disease)     left leg occluded viabahn stents x 3 left SFA  . Tachycardia-bradycardia syndrome     s/p PPM  . Cancer   . Arthritis   . Arthritis   . Stroke    Past Surgical History  Procedure Date  . Thyroidectomy     due to cancer  . Total hip arthroplasty   . Abdominal hysterectomy   . Mastectomy   . Mitral valve repair   . Intraocular lens insertion     left eye implant  . Pacemaker placement     for tachy/brady syndrome    reports that she has quit smoking. She does not have any smokeless tobacco history on file. She reports that she does not drink alcohol or use illicit drugs. family history includes Breast cancer in an unspecified family member; Colon cancer in an unspecified family member; Diabetes in an unspecified family member; Heart disease in her mother; Hyperlipidemia in an  unspecified family member; and Hypertension in an unspecified family member. Allergies  Allergen Reactions  . Donepezil Hydrochloride     REACTION: heart fluttering  . Morphine Sulfate   . Oxycodone-Acetaminophen     REACTION: Hallucinations     Review of Systems see hpi     Objective:   Physical Exam  Physical Exam  Nursing note and vitals reviewed. Constitutional: Appears well-developed and well-nourished. No distress.  HENT:  Head: Normocephalic and atraumatic.  Right Ear: External ear normal.  Left Ear: External ear normal.  Eyes: Conjunctivae are normal. No scleral icterus.  Neck: Neck supple. Carotid bruit is not present.  Cardiovascular: Normal rate, regular rhythm and normal heart sounds.  Exam reveals no gallop and no friction rub.   3/6 sm Pulmonary/Chest: Effort normal and breath sounds normal. No respiratory distress. He has no wheezes. no rales.  Lymphadenopathy:    He has no cervical adenopathy.  Neurological:Alert.  Skin: Skin is warm and dry. Not diaphoretic.  Psychiatric: Has a normal mood and affect.        Assessment & Plan:

## 2011-04-23 NOTE — Assessment & Plan Note (Signed)
Obtain lipid/lft with next lab draw.

## 2011-04-23 NOTE — Assessment & Plan Note (Signed)
Obtain tsh/free t4 

## 2011-04-23 NOTE — Assessment & Plan Note (Signed)
Schedule f/u echocardiogram

## 2011-04-25 ENCOUNTER — Telehealth: Payer: Self-pay | Admitting: *Deleted

## 2011-04-25 NOTE — Telephone Encounter (Signed)
Same dose. Recheck 3wks

## 2011-04-25 NOTE — Telephone Encounter (Signed)
[name] with [name] called and left voice message stating patient is taking 1.5 mg on Thursday and Saturday and 3 mg all other days.  Call was returned to [facility] with [name] [phone], she was informed per Dr Rodena Medin instructions, and has verbalized understanding.

## 2011-04-25 NOTE — Telephone Encounter (Signed)
Faxed received for lab results on PT 21.7 INR 2.08  Call placed to [facility] unavailable to verify coumadin dose. Message left for return call with current dose of Coumadin.

## 2011-05-12 ENCOUNTER — Other Ambulatory Visit (HOSPITAL_COMMUNITY): Payer: Self-pay | Admitting: Radiology

## 2011-05-12 ENCOUNTER — Ambulatory Visit (HOSPITAL_COMMUNITY): Payer: Medicare Other | Attending: Cardiology | Admitting: Radiology

## 2011-05-12 DIAGNOSIS — E785 Hyperlipidemia, unspecified: Secondary | ICD-10-CM | POA: Insufficient documentation

## 2011-05-12 DIAGNOSIS — E119 Type 2 diabetes mellitus without complications: Secondary | ICD-10-CM | POA: Insufficient documentation

## 2011-05-12 DIAGNOSIS — I350 Nonrheumatic aortic (valve) stenosis: Secondary | ICD-10-CM

## 2011-05-12 DIAGNOSIS — I2510 Atherosclerotic heart disease of native coronary artery without angina pectoris: Secondary | ICD-10-CM | POA: Insufficient documentation

## 2011-05-12 DIAGNOSIS — J449 Chronic obstructive pulmonary disease, unspecified: Secondary | ICD-10-CM | POA: Insufficient documentation

## 2011-05-12 DIAGNOSIS — Z87891 Personal history of nicotine dependence: Secondary | ICD-10-CM | POA: Insufficient documentation

## 2011-05-12 DIAGNOSIS — Z9981 Dependence on supplemental oxygen: Secondary | ICD-10-CM | POA: Insufficient documentation

## 2011-05-12 DIAGNOSIS — E039 Hypothyroidism, unspecified: Secondary | ICD-10-CM | POA: Insufficient documentation

## 2011-05-12 DIAGNOSIS — I447 Left bundle-branch block, unspecified: Secondary | ICD-10-CM | POA: Insufficient documentation

## 2011-05-12 DIAGNOSIS — I359 Nonrheumatic aortic valve disorder, unspecified: Secondary | ICD-10-CM | POA: Insufficient documentation

## 2011-05-12 DIAGNOSIS — I739 Peripheral vascular disease, unspecified: Secondary | ICD-10-CM | POA: Insufficient documentation

## 2011-05-12 DIAGNOSIS — J4489 Other specified chronic obstructive pulmonary disease: Secondary | ICD-10-CM | POA: Insufficient documentation

## 2011-05-12 DIAGNOSIS — Z86718 Personal history of other venous thrombosis and embolism: Secondary | ICD-10-CM | POA: Insufficient documentation

## 2011-05-12 DIAGNOSIS — Z86711 Personal history of pulmonary embolism: Secondary | ICD-10-CM | POA: Insufficient documentation

## 2011-05-17 ENCOUNTER — Telehealth: Payer: Self-pay | Admitting: *Deleted

## 2011-05-17 ENCOUNTER — Telehealth: Payer: Self-pay | Admitting: Internal Medicine

## 2011-05-17 NOTE — Telephone Encounter (Signed)
Call placed to [facility] [phone], spoke with Belenda Cruise. She was informed per Dr Rodena Medin instruction, and has verbalized understanding, by repeating order back to me.

## 2011-05-17 NOTE — Telephone Encounter (Signed)
Kendall from [facility] nursing home states that the soonest Dr. Ludwig Clarks office could see patient is on 06/24/11 at 12:15

## 2011-05-17 NOTE — Telephone Encounter (Signed)
Faxed received from [facility]. PT/INR results- phone [phone] Fax [phone]  PT 23.8 INR 2.28  Current dose of Coumadin is  3 mg daily. Please advise.

## 2011-05-17 NOTE — Telephone Encounter (Signed)
Same dose. Recheck 3wks 

## 2011-05-18 NOTE — Telephone Encounter (Signed)
Ok. Is followup of aortic stenosis

## 2011-05-25 ENCOUNTER — Telehealth: Payer: Self-pay | Admitting: *Deleted

## 2011-05-25 NOTE — Telephone Encounter (Signed)
Rita Miller with [name] called and left voice message stating patient has been complaining of back pain for the past 2 days and she would like to know if they could get an order to perform urine testing and xray if needed.

## 2011-05-25 NOTE — Telephone Encounter (Signed)
ua and will need results faxed

## 2011-05-25 NOTE — Telephone Encounter (Signed)
Call placed to [facility], [phone], spoke with Dot Lanes, she was informed per Dr Rodena Medin instructions to perform UA and have results faxed to [place]. Dot Lanes has verbalized understanding and agrees. Office fax number has been provided.  Will await test results.

## 2011-05-26 NOTE — Telephone Encounter (Signed)
tx for possible uti. macrobid 100mg  bid x 7d if not allergic and no interactions. Recommend sooner inr check. inr in one 5days

## 2011-05-26 NOTE — Telephone Encounter (Signed)
Call placed to [facility] [phone], spoke with Belenda Cruise, she was advised per Dr Rodena Medin instructions.

## 2011-05-26 NOTE — Telephone Encounter (Signed)
Urinalysis showed trace of blood, leukocytes, rbcs, and a few yeast. Faxed stated patient had a small nose bleed and still has complaints of back pain.

## 2011-05-30 ENCOUNTER — Ambulatory Visit: Payer: Medicare Other | Admitting: Family

## 2011-05-30 ENCOUNTER — Encounter: Payer: Self-pay | Admitting: Internal Medicine

## 2011-05-30 ENCOUNTER — Ambulatory Visit (HOSPITAL_BASED_OUTPATIENT_CLINIC_OR_DEPARTMENT_OTHER)
Admission: RE | Admit: 2011-05-30 | Discharge: 2011-05-30 | Disposition: A | Discharge status: Home / Self Care | Payer: Medicare Other | Source: Ambulatory Visit | Attending: Internal Medicine | Admitting: Internal Medicine

## 2011-05-30 ENCOUNTER — Ambulatory Visit (INDEPENDENT_AMBULATORY_CARE_PROVIDER_SITE_OTHER): Payer: Medicare Other | Admitting: Internal Medicine

## 2011-05-30 DIAGNOSIS — M549 Dorsalgia, unspecified: Secondary | ICD-10-CM

## 2011-05-30 DIAGNOSIS — L608 Other nail disorders: Secondary | ICD-10-CM

## 2011-05-30 DIAGNOSIS — I700 Atherosclerosis of aorta: Secondary | ICD-10-CM | POA: Insufficient documentation

## 2011-05-30 DIAGNOSIS — M5380 Other specified dorsopathies, site unspecified: Secondary | ICD-10-CM | POA: Insufficient documentation

## 2011-05-30 DIAGNOSIS — M899 Disorder of bone, unspecified: Secondary | ICD-10-CM

## 2011-05-30 DIAGNOSIS — M545 Low back pain, unspecified: Secondary | ICD-10-CM | POA: Insufficient documentation

## 2011-05-30 DIAGNOSIS — L603 Nail dystrophy: Secondary | ICD-10-CM

## 2011-05-30 NOTE — Progress Notes (Signed)
  Subjective:    Patient ID: Rita Miller, female    DOB: 1930-03-05, 77 y.o.   MRN: [id number]  HPI Pt presents to clinic for evaluation of back pain. Has experienced ~1wk of low back pain without radicular pain. Pt and staff deny injury or trauma. UA obtained ~10/17 with trace blood and LE. No better with macrobid. Denies leg weakness. Currently takes 1/2 vicodin 2.5mg  bid prn and has used lidoderm patches in the past but not recently. Maintained on coumadin without gross active bleeding. Staff requests podiatry referral for treatment of dystrophic toe nails. Podiatrist who goes to the facility did not see pt this last visit. No other complaints.  Past Medical History  Diagnosis Date  . CAD (coronary artery disease)   . History of CVA (cerebrovascular accident)   . Atrial fibrillation     peristent  . Hyperlipemia   . COPD (chronic obstructive pulmonary disease)   . Epistaxis     recurrent  . Osteoporosis   . Hypothyroidism   . History of pulmonary embolism   . History of colonic diverticulitis   . History of breast cancer   . Asthma   . Iron deficiency anemia   . History of transfusion of whole blood   . LBBB (left bundle branch block)     history of  . Aortal stenosis     severe  . Pacemaker     bradycardia s/p pacemaker implant  . PAD (peripheral artery disease)     left leg occluded viabahn stents x 3 left SFA  . Tachycardia-bradycardia syndrome     s/p PPM  . Cancer   . Arthritis   . Arthritis   . Stroke    Past Surgical History  Procedure Date  . Thyroidectomy     due to cancer  . Total hip arthroplasty   . Abdominal hysterectomy   . Mastectomy   . Mitral valve repair   . Intraocular lens insertion     left eye implant  . Pacemaker placement     for tachy/brady syndrome    reports that she has quit smoking. She has never used smokeless tobacco. She reports that she does not drink alcohol or use illicit drugs. family history includes Breast cancer in an  unspecified family member; Colon cancer in an unspecified family member; Diabetes in an unspecified family member; Heart disease in her mother; Hyperlipidemia in an unspecified family member; and Hypertension in an unspecified family member. Allergies  Allergen Reactions  . Donepezil Hydrochloride     REACTION: heart fluttering  . Morphine Sulfate   . Oxycodone-Acetaminophen     REACTION: Hallucinations       Review of Systems see hpi     Objective:   Physical Exam  Nursing note and vitals reviewed. Constitutional: She appears well-developed and well-nourished. No distress.  HENT:  Head: Normocephalic and atraumatic.  Musculoskeletal:       + midline ls tenderness without bony abn. + tenderness bilateral lumbar paraspinal muscles. Bilateral LE strength 5/5.  Neurological: She is alert.  Skin: Skin is warm and dry. She is not diaphoretic.       Bilateral dystrophic nails.  Psychiatric: She has a normal mood and affect.          Assessment & Plan:

## 2011-05-30 NOTE — Assessment & Plan Note (Signed)
Obtain ls plain radiograph. Increase vicodin to 2.5mg  po tid prn. Begin lidoderm patches. Followup if no improvement or worsening.

## 2011-05-30 NOTE — Assessment & Plan Note (Signed)
Podiatry referral

## 2011-06-02 ENCOUNTER — Encounter: Payer: Self-pay | Admitting: Vascular Surgery

## 2011-06-03 ENCOUNTER — Ambulatory Visit (INDEPENDENT_AMBULATORY_CARE_PROVIDER_SITE_OTHER): Payer: Medicare Other | Admitting: Vascular Surgery

## 2011-06-03 ENCOUNTER — Encounter: Payer: Self-pay | Admitting: Vascular Surgery

## 2011-06-03 ENCOUNTER — Ambulatory Visit (INDEPENDENT_AMBULATORY_CARE_PROVIDER_SITE_OTHER): Payer: Medicare Other

## 2011-06-03 VITALS — BP 121/66 | HR 72 | Resp 20 | Ht 61.0 in | Wt 100.0 lb

## 2011-06-03 DIAGNOSIS — I739 Peripheral vascular disease, unspecified: Secondary | ICD-10-CM

## 2011-06-03 DIAGNOSIS — I70219 Atherosclerosis of native arteries of extremities with intermittent claudication, unspecified extremity: Secondary | ICD-10-CM

## 2011-06-03 DIAGNOSIS — I998 Other disorder of circulatory system: Secondary | ICD-10-CM

## 2011-06-03 DIAGNOSIS — I70229 Atherosclerosis of native arteries of extremities with rest pain, unspecified extremity: Secondary | ICD-10-CM | POA: Insufficient documentation

## 2011-06-03 DIAGNOSIS — I350 Nonrheumatic aortic (valve) stenosis: Secondary | ICD-10-CM

## 2011-06-03 DIAGNOSIS — I999 Unspecified disorder of circulatory system: Secondary | ICD-10-CM

## 2011-06-03 NOTE — Progress Notes (Signed)
[facility] [place]  Established Critical Limb Ischemia Patient  History of Present Illness  Rita Miller is a 77 y.o. female who presents with chief complaint: back pain.  The patient denied any rest pain and no foot wounds.   The patient notes symptoms have not progressed.  The patient's treatment regimen currently included: maximal medical management.  Her history is somewhat limited by dementia  Past Medical History, Past Surgical History, Social History, Family History, Medications, Allergies, and Review of Systems are unchanged from previous evaluation on 01 Oct 2010.  Physical Examination  Filed Vitals:   06/03/11 1611  BP: 121/66  Pulse: 72  Resp: 20  Height: 5\' 1"  (1.549 m)  Weight: 100 lb (45.36 kg)  SpO2: 96%    General: A&O x 3, WDWN, cachectic  Pulmonary: Sym exp, good air movt, CTAB, no rales, rhonchi, & wheezing  Cardiac: RRR, Nl S1, S2, holosystolic murmur, no rubs  Vascular: Vessel Right Left  Radial Palpable Palpable  Brachial Palpable Palpable  Carotid Palpable, without bruit Palpable, without bruit  Aorta Non-palpable N/A  Femoral Palpable Palpable  Popliteal Non-palpable Non-palpable  PT Non-Palpable Non-Palpable  DP Non-Palpable Non-Palpable   Gastrointestinal: soft, NTND, -G/R, - HSM, - masses, - CVAT B  Musculoskeletal: M/S 5/5 throughout , B feet with cyanotic hue, left foot with ischemic changes without frank gangrene  Neurologic: Sensation grossly intact in all extremities including left foot, Motor exam as listed above  Non-Invasive Vascular Imaging ABI (Date: 06/01/11)  RLE: 0.77, PT: monophasic, DP: biphasic  LLE: 0.47, PT & DP: monophasic  Medical Decision Making  Rita Miller is a 77 y.o. female who presents with: BLE PAD with ischemic left foot.   The patient is not a candidate for bypass operations due to her severe AS  Due to the presence of a L extraluminal popliteal stent placed by her previous  cardiologist, I doubt an endovascular intervention is possible  The patient does not want an amputation and she is asx at this point, so I don't think there is a pressing need for L AKA  I would repeat the ABI in 6 month and reevaluate her at that point  She may require return to clinic sooner if her left foot deteriorates further  Thank you for allowing [place] to participate in this patient's care.  Leonides Sake, MD [facility] [place] Office: [phone] Pager: [phone]

## 2011-06-06 ENCOUNTER — Telehealth: Payer: Self-pay | Admitting: *Deleted

## 2011-06-06 NOTE — Telephone Encounter (Signed)
Faxed results received on patients PT/INR on 06/06/2011 from [facility] fax [phone]/phone [phone]. Message on fax stated will start holding coumadin today  PT 54.7 INR 5.53

## 2011-06-06 NOTE — Telephone Encounter (Signed)
Hold coumadin until further notice. ED if develops bleeding. Recheck wed am stat

## 2011-06-07 NOTE — Telephone Encounter (Signed)
Call placed to [facility] at [phone], spoke with Eunice Blase. She was informed per Dr Rodena Medin instructions and has verbalized understanding.  Eunice Blase stated patient has intermittent nose bleeds that has been controllable, but has not had one recently. She was informed if nose bleed becomes uncontrollable, patient would need ED evaluation. Eunice Blase has verbalized understanding and agrees as instructed

## 2011-06-10 ENCOUNTER — Telehealth: Payer: Self-pay | Admitting: *Deleted

## 2011-06-10 NOTE — Telephone Encounter (Signed)
Received message from [place] at [facility] re: PT/INR results.  PT--24.0  INR--2.31 from 06/09/11. Coumadin currently on hold. Prior dose was 3mg  daily.  Please advise.

## 2011-06-10 NOTE — Telephone Encounter (Signed)
Notified [name] at [facility] re: instructions below.

## 2011-06-10 NOTE — Telephone Encounter (Signed)
Please call RN and advise that pt should restart coumadin at 2mg  once daily and have a follow up PT/INR drawn in 1 week.

## 2011-06-14 ENCOUNTER — Telehealth: Payer: Self-pay | Admitting: *Deleted

## 2011-06-14 ENCOUNTER — Telehealth: Payer: Self-pay | Admitting: Internal Medicine

## 2011-06-14 NOTE — Telephone Encounter (Signed)
Please ask them to monitor pt.  If bloody or dark sputum, cough or fever ask them to notify us please.

## 2011-06-14 NOTE — Telephone Encounter (Signed)
Received message from nurse at [facility] that pt had dark sputumn yesterday and wanted to know if we wanted to order a CXR? Spoke to [facility] today and she reports that pt has not had any dark sputum today, no cough, SOB or other symptoms noted. Please advise.

## 2011-06-14 NOTE — Telephone Encounter (Signed)
Pt's daughter called wanting results of back xray and is aware that it will be Thursday before we can provide results as Provider is out of the office until then. [name] voices understanding.

## 2011-06-14 NOTE — Telephone Encounter (Signed)
Notified Darlene at [facility].

## 2011-06-15 ENCOUNTER — Other Ambulatory Visit: Payer: Self-pay | Admitting: Internal Medicine

## 2011-06-15 ENCOUNTER — Telehealth: Payer: Self-pay | Admitting: *Deleted

## 2011-06-15 NOTE — Telephone Encounter (Signed)
bmd order placed

## 2011-06-15 NOTE — Telephone Encounter (Signed)
Belenda Cruise called back from [facility], she stated patient last PT/INR check was done on 06/09/2011 and she is scheduled for another draw for 06/16/2011.

## 2011-06-15 NOTE — Telephone Encounter (Signed)
There is a mild area of a vertebrae that has shortened/compressed. May or may not be causing pain. But no other significant findings. May need bmd if not utd within 2 years. ? Status of back pain

## 2011-06-15 NOTE — Telephone Encounter (Signed)
Call placed to [facility] spoke with Belenda Cruise regarding PT/INR report received from 06/08/2011. She was unable to verify last PT/INR draw.  See Phone note from 06/10/2011.

## 2011-06-15 NOTE — Telephone Encounter (Signed)
Call placed to [facility] at [phone]; spoke with Liborio Nixon. She stated there is no record on file showing patient has a bone density.

## 2011-06-15 NOTE — Telephone Encounter (Signed)
Call returned to patients daughter [name] at [phone], she was informed per Dr Rodena Medin instructions and has verbalized understanding. She stated she was not sure when patient last had a bone density. Patients daughter [name] would like to proceed with scheduling Dexa for patient. [name] was advised a call would be returned to her if Dexa was to be scheduled.

## 2011-06-16 NOTE — Telephone Encounter (Signed)
Call placed to [facility], spoke with [name] C.; She was advised per Dr Rodena Medin instructions and has verbalized understanding.

## 2011-06-16 NOTE — Telephone Encounter (Signed)
[name] with [name] called and left voice message stating patients INR is 1.97 she is taking coumadin 2 mg daily. Please advise.

## 2011-06-16 NOTE — Telephone Encounter (Signed)
Same dose.  Recheck 1 week.

## 2011-06-17 ENCOUNTER — Encounter: Payer: Self-pay | Admitting: Internal Medicine

## 2011-06-23 LAB — PROTIME-INR

## 2011-06-24 ENCOUNTER — Telehealth: Payer: Self-pay | Admitting: *Deleted

## 2011-06-24 ENCOUNTER — Ambulatory Visit (INDEPENDENT_AMBULATORY_CARE_PROVIDER_SITE_OTHER): Payer: Medicare Other | Admitting: Cardiology

## 2011-06-24 ENCOUNTER — Encounter: Payer: Self-pay | Admitting: Cardiology

## 2011-06-24 VITALS — BP 118/66 | HR 88 | Resp 18 | Ht 62.0 in | Wt 100.4 lb

## 2011-06-24 DIAGNOSIS — Z950 Presence of cardiac pacemaker: Secondary | ICD-10-CM

## 2011-06-24 DIAGNOSIS — I4891 Unspecified atrial fibrillation: Secondary | ICD-10-CM

## 2011-06-24 DIAGNOSIS — I2510 Atherosclerotic heart disease of native coronary artery without angina pectoris: Secondary | ICD-10-CM

## 2011-06-24 DIAGNOSIS — I359 Nonrheumatic aortic valve disorder, unspecified: Secondary | ICD-10-CM

## 2011-06-24 DIAGNOSIS — I739 Peripheral vascular disease, unspecified: Secondary | ICD-10-CM

## 2011-06-24 DIAGNOSIS — E785 Hyperlipidemia, unspecified: Secondary | ICD-10-CM

## 2011-06-24 DIAGNOSIS — Z9889 Other specified postprocedural states: Secondary | ICD-10-CM

## 2011-06-24 NOTE — Progress Notes (Signed)
HPI: Pleasant female with past medical history of atrial fibrillation, prior pacemaker, coronary artery disease, aortic stenosis, mitral regurgitation s/p MV repair (apparently felt not to be a candidate for AVR at the time) for fu. Last Myoview was performed in May of 2010 and showed normal perfusion and an ejection fraction of 53%. Last echocardiogram was performed in Oct 2012 and showed an ejection fraction of 55-60%, severe aortic stenosis with a mean gradient of 52 mm of mercury, mild aortic insufficiency, status post mitral valve repair with mild mitral stenosis and mild mitral regurgitation and mildly elevated pulmonic pressures. The left atrium was severely dilated. Patient also with severe PVD and felt not to be an operative candiate by vasc surgery. Since I last saw her in Dec 2010, she denies any dyspnea on exertion, orthopnea, PND, pedal edema, palpitations, syncope or substernal chest pain. Note she has very limited mobility and uses a walker to ambulate.  Current Outpatient Prescriptions  Medication Sig Dispense Refill  . acetaminophen (TYLENOL) 325 MG tablet Take 650 mg by mouth every 6 (six) hours as needed.        Marland Kitchen albuterol (PROVENTIL) (2.5 MG/3ML) 0.083% nebulizer solution Take 2.5 mg by nebulization 4 (four) times daily.        Marland Kitchen aspirin EC 81 MG tablet Take 81 mg by mouth daily.        Marland Kitchen atorvastatin (LIPITOR) 20 MG tablet Take 20 mg by mouth daily.        . clotrimazole-betamethasone (LOTRISONE) cream Apply topically 2 (two) times daily.        Marland Kitchen diltiazem (CARDIZEM CD) 240 MG 24 hr capsule Take 240 mg by mouth daily.        . fentaNYL (DURAGESIC - DOSED MCG/HR) 12 MCG/HR Place 1 patch onto the skin every 3 (three) days.        . fluticasone (FLOVENT HFA) 44 MCG/ACT inhaler Inhale 1 puff into the lungs 2 (two) times daily.        Marland Kitchen HYDROcodone-acetaminophen (VICODIN) 2.5-500 MG per tablet 1/2 tablet by mouth twice a day as needed  30 tablet  2  . levofloxacin (LEVAQUIN) 250 MG  tablet Take 250 mg by mouth daily.        Marland Kitchen levothyroxine (SYNTHROID, LEVOTHROID) 75 MCG tablet Take 1 tablet (75 mcg total) by mouth daily.      Marland Kitchen lidocaine (LIDODERM) 5 % Place 1 patch onto the skin daily. Remove & Discard patch within 12 hours or as directed by MD       . Melatonin 3 MG TABS Take 3 mg by mouth at bedtime as needed.        Marland Kitchen NAMENDA 10 MG tablet       . nitrofurantoin, macrocrystal-monohydrate, (MACROBID) 100 MG capsule       . NON FORMULARY neosynephrine nasal spray 0.25% Two sprays in each nostril every 12 hours as needed.       Marland Kitchen omeprazole (PRILOSEC) 20 MG capsule       . ondansetron (ZOFRAN-ODT) 8 MG disintegrating tablet Take 8 mg by mouth every 8 (eight) hours as needed.        . rivastigmine (EXELON) 4.6 mg/24hr Place 1 patch onto the skin daily.        . valsartan (DIOVAN) 40 MG tablet Take 1 tablet (40 mg total) by mouth daily.  30 tablet  11  . warfarin (COUMADIN) 2 MG tablet Take 2 mg by mouth as directed.  Past Medical History  Diagnosis Date  . CAD (coronary artery disease)   . History of CVA (cerebrovascular accident)   . Atrial fibrillation     peristent  . Hyperlipemia   . COPD (chronic obstructive pulmonary disease)   . Epistaxis     recurrent  . Osteoporosis   . Hypothyroidism   . History of pulmonary embolism   . History of colonic diverticulitis   . History of breast cancer   . Asthma   . Iron deficiency anemia   . History of transfusion of whole blood   . LBBB (left bundle branch block)     history of  . Aortal stenosis     severe  . Pacemaker     bradycardia s/p pacemaker implant  . PAD (peripheral artery disease)     left leg occluded viabahn stents x 3 left SFA  . Tachycardia-bradycardia syndrome     s/p PPM  . Cancer   . Arthritis   . Stroke     Past Surgical History  Procedure Date  . Thyroidectomy     due to cancer  . Total hip arthroplasty   . Abdominal hysterectomy   . Mastectomy   . Mitral valve repair     . Intraocular lens insertion     left eye implant  . Pacemaker placement     for tachy/brady syndrome    History   Social History  . Marital Status: Widowed    Spouse Name: N/A    Number of Children: N/A  . Years of Education: N/A   Occupational History  . Not on file.   Social History Main Topics  . Smoking status: Former Games developer  . Smokeless tobacco: Never Used  . Alcohol Use: No  . Drug Use: No  . Sexually Active: Not on file   Other Topics Concern  . Not on file   Social History Narrative   RetiredWidow 1 daughter          ROS: no fevers or chills, productive cough, hemoptysis, dysphasia, odynophagia, melena, hematochezia, dysuria, hematuria, rash, seizure activity, orthopnea, PND, pedal edema, claudication. Remaining systems are negative.  Physical Exam: Well-developed frail in no acute distress.  Skin is warm and dry.  HEENT is normal.  Neck is supple. No thyromegaly.  Chest is clear to auscultation with normal expansion.  Cardiovascular exam is regular rate and rhythm. 3/6 systolic and 2/6 systolic murmur left sternal border. Abdominal exam nontender or distended. No masses palpated. Extremities show no edema. neuro grossly intact  ECG ventricular pacing with probable underlying atrial fibrillation.

## 2011-06-24 NOTE — Assessment & Plan Note (Signed)
Continue present medications. 

## 2011-06-24 NOTE — Assessment & Plan Note (Signed)
Continue SBE prophylaxis. 

## 2011-06-24 NOTE — Patient Instructions (Signed)
Your physician wants you to follow-up in:  6 months. You will receive a reminder letter in the mail two months in advance. If you don't receive a letter, please call our office to schedule the follow-up appointment.   

## 2011-06-24 NOTE — Assessment & Plan Note (Signed)
Continue statin. Lipids and liver monitored by primary care. 

## 2011-06-24 NOTE — Assessment & Plan Note (Signed)
Continue Cardizem and Coumadin. 

## 2011-06-24 NOTE — Assessment & Plan Note (Signed)
Management per electrophysiology. 

## 2011-06-24 NOTE — Telephone Encounter (Signed)
PT/INR results received on from [facility]. PT 20.8 INR 1.98.  Results faxed back to [facility] at [phone] advising to have patient to take an extra 1 mg today and continue same dosing, and recheck PT/INR in  2 weeks.  Results sent for scanning.

## 2011-06-24 NOTE — Assessment & Plan Note (Signed)
Managed by vascular surgery. Felt not to be a surgical candidate.

## 2011-06-24 NOTE — Assessment & Plan Note (Signed)
Patient has severe aortic stenosis on echocardiogram. However she is not having symptoms including no chest pain, shortness of breath or syncope. Given her frail body habitus and multiple medical problems I doubt she would be a good candidate for aortic valve replacement. She also states she would never consider surgery. Therefore plan medical therapy.

## 2011-06-28 ENCOUNTER — Telehealth: Payer: Self-pay | Admitting: Internal Medicine

## 2011-06-28 ENCOUNTER — Encounter: Payer: Self-pay | Admitting: Internal Medicine

## 2011-06-28 ENCOUNTER — Other Ambulatory Visit: Payer: Medicare Other

## 2011-06-28 MED ORDER — OSELTAMIVIR PHOSPHATE 75 MG PO CAPS: 75.0000 mg | ORAL_CAPSULE | Freq: Every day | ORAL | Status: AC

## 2011-06-28 NOTE — Telephone Encounter (Signed)
Call returned to [facility] at [phone], spoke with [name], verbal order provided for Tamiflu as per Dr Rodena Medin instructions.

## 2011-06-28 NOTE — Telephone Encounter (Signed)
tamiflu 75mg  po qd x 10d- for prevention

## 2011-06-28 NOTE — Telephone Encounter (Signed)
Call from [facility]  , [place],  Needs order for pt to have Tamaflu,  Congestion & cough

## 2011-06-28 NOTE — Telephone Encounter (Signed)
Call returned to [facility] [phone], spoke with Darl Pikes. She was asked about patient symptoms. She stated the Physician in the office wants to treat every one at the facility because of a positive case. Patient does not have any symptoms.

## 2011-07-04 ENCOUNTER — Encounter: Payer: Self-pay | Admitting: Internal Medicine

## 2011-07-05 ENCOUNTER — Ambulatory Visit
Admission: RE | Admit: 2011-07-05 | Discharge: 2011-07-05 | Disposition: A | Discharge status: Home / Self Care | Payer: Medicare Other | Source: Ambulatory Visit | Attending: Internal Medicine | Admitting: Internal Medicine

## 2011-07-05 ENCOUNTER — Other Ambulatory Visit: Payer: Medicare Other

## 2011-07-08 ENCOUNTER — Telehealth: Payer: Self-pay | Admitting: *Deleted

## 2011-07-08 NOTE — Telephone Encounter (Signed)
Lab result received from [facility] on patients PT/INR from 07/07/2011. PT 20.0 INR 1.90  Dr. Rodena Medin advised to change dose to 3 mg on Saturday and other days 2 mg. Recheck PT/INR in 2 weeks. Information was faxed to [facility] at [phone].  Verbal confirmation obtained by Archie Patten at [facility]

## 2011-07-11 ENCOUNTER — Other Ambulatory Visit: Payer: Medicare Other | Admitting: Lab

## 2011-07-11 ENCOUNTER — Ambulatory Visit (HOSPITAL_BASED_OUTPATIENT_CLINIC_OR_DEPARTMENT_OTHER): Payer: Medicare Other | Admitting: Hematology & Oncology

## 2011-07-11 ENCOUNTER — Telehealth: Payer: Self-pay | Admitting: *Deleted

## 2011-07-11 VITALS — BP 111/58 | HR 68 | Temp 97.2°F

## 2011-07-11 DIAGNOSIS — C50919 Malignant neoplasm of unspecified site of unspecified female breast: Secondary | ICD-10-CM

## 2011-07-11 DIAGNOSIS — D509 Iron deficiency anemia, unspecified: Secondary | ICD-10-CM

## 2011-07-11 DIAGNOSIS — J449 Chronic obstructive pulmonary disease, unspecified: Secondary | ICD-10-CM

## 2011-07-11 DIAGNOSIS — E785 Hyperlipidemia, unspecified: Secondary | ICD-10-CM

## 2011-07-11 DIAGNOSIS — G8929 Other chronic pain: Secondary | ICD-10-CM

## 2011-07-11 DIAGNOSIS — Z853 Personal history of malignant neoplasm of breast: Secondary | ICD-10-CM

## 2011-07-11 NOTE — Telephone Encounter (Signed)
Fax received from [facility] for medication reconciliation signature. Form has been reviewed and verified with Okey Regal at [facility] on 07/08/2011. She was advised verbally to have patient discontinue Cozaar per Dr Rodena Medin instruction; as patient did not need to be on both Diovan and Cozaar.   Medication list reviewed and updated to reflect what patient is currently receiving at [facility].

## 2011-07-11 NOTE — Progress Notes (Signed)
This office note has been dictated.

## 2011-07-12 ENCOUNTER — Telehealth: Payer: Self-pay | Admitting: *Deleted

## 2011-07-12 NOTE — Telephone Encounter (Signed)
Made [place] appointment at [facility] for 09-14-11 130 pm with Jill Alexanders. Called [facility] spoke with Talbert Forest they are aware of 2-6 [place] and 2-13 MD appointments and times. Faxed order to [facility]

## 2011-07-12 NOTE — Progress Notes (Signed)
CC:   Rita Miller. Rita Som, MD, [facility] Rita Craze, NP  DIAGNOSES: 1. Iron deficiency anemia. 2. Remote history of early stage ductal carcinoma of the right breast.  CURRENT THERAPY:  IV iron as indicated.  HISTORY:  Rita Miller comes in for followup.  I last saw her in August.  When we last saw her, she was I think in a nursing home.  I think she has had more problems with respect to dementia than anything else.  She last got iron back in May.  She got 1020 mg of Feraheme.  Her ferritin when we saw her in August was 446.  She still has some left breast pain.  She had a mammogram done I think back in June.  This was noted to be suspicious.  However, she did not wish to undergo any further testing.  We tried to get her set up with another mammogram but again she has refused.  She did have an ultrasound done in August.  She was noted to have 2 nodules in the left lateral breast.  One measured 6 mm.  The other measured 5 mm.  It was felt that a followup ultrasound in 6 months was indicated.  These nodules are felt to be benign.  We will have to get her set up with another ultrasound if she will agree.  She has not noted any swelling in the left breast.  There has been no left arm swelling.  She has had no abdominal pain.  There has been no change in bowel or bladder habits.  She has had no cough.  Her appetite seems to be doing fairly well.  She wants to move back up to [place].  PHYSICAL EXAMINATION:  General:  This is a elderly, petite white female in no obvious distress.  Vital signs:  Show a temperature of 97.2, pulse 68, respiratory rate 16, blood pressure 111/58.  Weight was not taken. Head and neck:  Exam shows a normocephalic, atraumatic skull.  There are no ocular or oral lesions.  Lymphs:  She has no palpable cervical or supraclavicular lymph nodes.  Lungs:  Clear bilaterally.  Cardiac: Regular rate and rhythm with normal S1, S2.  She has a 3/6 systolic ejection  murmur.  Breasts:  Exam shows right chest wall shows well- healed mastectomy.  No right chest wall nodules or nodes.  No right axillary adenopathy.  Left breast is somewhat tender.  This is chronic. No swelling or erythema that is noted in the left breast.  There is no left axillary adenopathy.  No distinct mass noted in the left breast. Abdomen:  Soft with good bowel sounds.  There is no fluid wave.  There is no guarding or rebound tenderness.  There is no palpable hepatosplenomegaly.  Back:  Shows kyphosis.  There is no tenderness over the spine, ribs or hips.  Extremities:  Shows some muscle atrophy in upper and lower extremities.  She has decent range of motion of her joints.  She has some osteoarthritic changes in her joints.  Skin:  No rashes, ecchymosis or petechiae.  LABORATORY STUDIES:  Not done this visit as the patient was late arriving to the office.  IMPRESSION:  Rita Miller is a 77 year old white female with what I feel is a more active issue, that being iron deficiency anemia.  She seems to be doing pretty well right now.  She certainly does not look anemic.  I do not think we have to get her into blood  work.  I think we are going to have to somehow get another ultrasound of her left breast.  Again, she sort of "dictates" her care.  I know that she means well but it can be a little bit frustrating to get appropriate tests done.  We will plan to get the ultrasound in February next year.  Will then plan to see her back afterwards.    ______________________________ Josph Macho, M.D. PRE/MEDQ  D:  07/11/2011  T:  07/11/2011  Job:  [id number]

## 2011-07-13 NOTE — Telephone Encounter (Signed)
Forms completed and faxed back to [facility] reflecting provider changes.

## 2011-07-15 ENCOUNTER — Telehealth: Payer: Self-pay | Admitting: *Deleted

## 2011-07-15 NOTE — Telephone Encounter (Signed)
Nursing Home called are aware of [place] and MD appointments in feburary

## 2011-07-21 LAB — PROTIME-INR

## 2011-07-22 ENCOUNTER — Telehealth: Payer: Self-pay | Admitting: *Deleted

## 2011-07-22 ENCOUNTER — Encounter: Payer: Self-pay | Admitting: Internal Medicine

## 2011-07-22 NOTE — Telephone Encounter (Signed)
Fax received from [facility] with patients PT/INR results.  PT 20.2- INR 1.92. Current does Coumadin 2mg  on Mon, Tues, Wed, Thur. & Sun. 3mg  on Sat. Please advise.

## 2011-07-22 NOTE — Telephone Encounter (Signed)
Instructions faxed back to [facility] [phone].

## 2011-07-22 NOTE — Telephone Encounter (Signed)
Take extra 2mg  dose today only (in addition to regular dose.) then resume her regular dosing schedule tomorrow without any changes. Recheck 2 wks

## 2011-08-03 ENCOUNTER — Encounter: Payer: Self-pay | Admitting: Internal Medicine

## 2011-08-05 ENCOUNTER — Telehealth: Payer: Self-pay | Admitting: *Deleted

## 2011-08-05 NOTE — Telephone Encounter (Signed)
Notified Radiographer, therapeutic at [facility].

## 2011-08-05 NOTE — Telephone Encounter (Signed)
Same dose- recheck 2 wks

## 2011-08-05 NOTE — Telephone Encounter (Signed)
Received fax from [facility] that pt is currently taking Coumadin 3mg  on Saturday and 2mg  Sun - Fri.  Most recent PT/INR result from 08/04/11:  PT--25.4  INR 2.45.  Please advise.

## 2011-08-18 ENCOUNTER — Encounter: Payer: Self-pay | Admitting: Internal Medicine

## 2011-08-19 ENCOUNTER — Telehealth: Payer: Self-pay | Admitting: *Deleted

## 2011-08-19 NOTE — Telephone Encounter (Signed)
fax received from [facility] with patients lab results for PT INR. 08/18/2011- PT 23.2         INR 2.23  Patient to continue Coumadin 2 mg  6 days  A week and 3 Mg on Saturday.  Advised to recheck in one month. Call placed to [facility] spoke with Aggie Cosier order faxed and staff aware.

## 2011-08-24 ENCOUNTER — Encounter: Payer: Self-pay | Admitting: Internal Medicine

## 2011-09-15 LAB — PROTIME-INR

## 2011-09-16 ENCOUNTER — Telehealth: Payer: Self-pay | Admitting: Family

## 2011-09-16 NOTE — Telephone Encounter (Signed)
Please call nursing home and let them know that I reviewed INR- continue current dose of coumadin:  Coumadin 2 mg 6 days A week and 3 Mg on Saturday.  Follow up PT/INR in 1 month please.

## 2011-09-16 NOTE — Telephone Encounter (Signed)
Verbal order given to RN, Eunice Blase at [facility] [phone].

## 2011-09-21 ENCOUNTER — Ambulatory Visit (HOSPITAL_BASED_OUTPATIENT_CLINIC_OR_DEPARTMENT_OTHER): Payer: Medicare Other | Admitting: Hematology & Oncology

## 2011-09-21 ENCOUNTER — Other Ambulatory Visit (HOSPITAL_BASED_OUTPATIENT_CLINIC_OR_DEPARTMENT_OTHER): Payer: Medicare Other | Admitting: Lab

## 2011-09-21 DIAGNOSIS — Z853 Personal history of malignant neoplasm of breast: Secondary | ICD-10-CM

## 2011-09-21 DIAGNOSIS — N644 Mastodynia: Secondary | ICD-10-CM

## 2011-09-21 DIAGNOSIS — D509 Iron deficiency anemia, unspecified: Secondary | ICD-10-CM

## 2011-09-21 LAB — IRON AND TIBC
%SAT: 24 % (ref 20–55)
Iron: 59 ug/dL (ref 42–145)

## 2011-09-21 LAB — RETICULOCYTES (CHCC)
RBC.: 3.25 MIL/uL — ABNORMAL LOW (ref 3.87–5.11)
Retic Ct Pct: 1.1 % (ref 0.4–2.3)

## 2011-09-21 LAB — CHCC SATELLITE - SMEAR

## 2011-09-21 LAB — CBC WITH DIFFERENTIAL (CANCER CENTER ONLY)
BASO#: 0.1 10*3/uL (ref 0.0–0.2)
Eosinophils Absolute: 0.4 10*3/uL (ref 0.0–0.5)
HGB: 10.3 g/dL — ABNORMAL LOW (ref 11.6–15.9)
MCH: 32.3 pg (ref 26.0–34.0)
MONO#: 0.6 10*3/uL (ref 0.1–0.9)
MONO%: 10 % (ref 0.0–13.0)
NEUT#: 3.6 10*3/uL (ref 1.5–6.5)
Platelets: 214 10*3/uL (ref 145–400)
RBC: 3.19 10*6/uL — ABNORMAL LOW (ref 3.70–5.32)
WBC: 6.2 10*3/uL (ref 3.9–10.0)

## 2011-09-21 NOTE — Progress Notes (Signed)
This office note has been dictated.

## 2011-09-22 NOTE — Progress Notes (Signed)
CC:   Sandford Craze, NP Madolyn Frieze. Jens Som, MD, [facility], [place] [phone]  DIAGNOSES: 1. Iron-deficiency anemia, recurrent. 2. History of stage I ductal carcinoma of the right breast.  CURRENT THERAPY:  Observation.  INTERIM HISTORY:  Ms. Plotz comes in for followup.  She has been complaining of pain in the left breast.  When we last saw her, we had set her up to get an ultrasound done.  Unfortunately, she never got this done.  I am not sure what the problem was.  She is going to have an ultrasound, I think, in a week or so.  Again, she is complaining of pain in the left breast.  This has been an issue for her.  I just have never felt anything in that breast when we have examined her.  She last got iron, I think, back in May 2012.  She got 1,020 mg of Feraheme.  When we last saw her, her ferritin was 446.  She denies any obvious bleeding.  Her appetite seems to be doing okay. She has had no leg swelling.  She has had no fever, sweats, or chills. There has been no cough or shortness breath.  PHYSICAL EXAMINATION:  General Appearance:  This is an elderly, petite, white female in no obvious distress.  Vital Signs:  Temperature 97. Pulse 70.  Respiratory rate 20.  Blood pressure 116/53.  Weight is 99 pounds.  Head and Neck Exam:  A normocephalic, atraumatic skull.  There are no ocular or oral lesions.  There are no palpable cervical or supraclavicular lymph nodes.  Lungs:  Clear bilaterally.  Cardiac Exam: Regular rate and rhythm with a normal S1 and S2.  There are no murmurs, rubs, or bruits.  Abdominal Exam:  Soft with good bowel sounds.  There is no palpable abdominal mass.  There is no palpable hepatosplenomegaly. Back Exam:  No tenderness over the spine, ribs, or hips.  She does have some slight osteoporotic changes in her back.  Extremities:  Some osteoarthritic changes.  No swelling is noted in her lower legs.  Skin Exam:  No rashes,  ecchymosis, or petechia.  Breast Exam:  No swelling in the left breast.  The left breast has some tenderness again, which is chronic.  There is no left axillary adenopathy.  LABORATORY STUDIES:  White cell count is 6.2, hemoglobin 10.3, hematocrit 31.8, platelet count is 214.  MCV is 100.  IMPRESSION:  Ms. Conrow is a 78 year old, white female with recurrent iron-deficiency anemia.  She is on quite a few medications.  She is relatively asymptomatic.  We will go ahead and see what her iron stores are.  It is possible that she may need another dose of IV iron.  There is some question about her going back up to [place] to live with her family.  I am not sure if this is true or not.  Ms. Pietila is literally on 20 different medicines.  This is not going to help her anemia.  I will plan to get Ms. Wahlert back to see me in another 3-4 months.  We will see what the breast ultrasound shows.  Again, I cannot imagine this being a problem.    ______________________________ Josph Macho, M.D. PRE/MEDQ  D:  09/21/2011  T:  09/21/2011  Job:  [id number]

## 2011-09-23 ENCOUNTER — Encounter: Payer: Self-pay | Admitting: Internal Medicine

## 2011-09-29 ENCOUNTER — Encounter: Payer: Self-pay | Admitting: Internal Medicine

## 2011-09-29 ENCOUNTER — Ambulatory Visit (INDEPENDENT_AMBULATORY_CARE_PROVIDER_SITE_OTHER): Payer: Medicare Other | Admitting: Internal Medicine

## 2011-09-29 VITALS — BP 110/60 | HR 72 | Temp 97.7°F | Resp 18 | Ht 60.0 in | Wt 98.0 lb

## 2011-09-29 DIAGNOSIS — R634 Abnormal weight loss: Secondary | ICD-10-CM

## 2011-09-29 DIAGNOSIS — Z23 Encounter for immunization: Secondary | ICD-10-CM

## 2011-09-29 DIAGNOSIS — E785 Hyperlipidemia, unspecified: Secondary | ICD-10-CM

## 2011-09-29 MED ORDER — MEGESTROL ACETATE 400 MG/10ML PO SUSP: 400.0000 mg | Freq: Every day | ORAL | Status: DC

## 2011-09-29 NOTE — Assessment & Plan Note (Signed)
Unintended. TSH utd. Attempt short term megace for stabilization of wt. Monitor wt closely

## 2011-09-29 NOTE — Progress Notes (Signed)
  Subjective:    Patient ID: Rita Miller, female    DOB: 1930/04/10, 78 y.o.   MRN: [id number]  HPI Pt presents to clinic for followup of multiple medical problems. Note unintended wt loss of 6lbs since last visit 10/12. States good appetite. Has chronic intermittent back pain but seems to indicate possible improved from last visit- added lidoderm patches at that time. Facility has record of utd pneumovax but no tetanus or tdap.   Past Medical History  Diagnosis Date  . CAD (coronary artery disease)   . History of CVA (cerebrovascular accident)   . Atrial fibrillation     peristent  . Hyperlipemia   . COPD (chronic obstructive pulmonary disease)   . Epistaxis     recurrent  . Osteoporosis   . Hypothyroidism   . History of pulmonary embolism   . History of colonic diverticulitis   . History of breast cancer   . Asthma   . Iron deficiency anemia   . History of transfusion of whole blood   . LBBB (left bundle branch block)     history of  . Aortal stenosis     severe  . Pacemaker     bradycardia s/p pacemaker implant  . PAD (peripheral artery disease)     left leg occluded viabahn stents x 3 left SFA  . Tachycardia-bradycardia syndrome     s/p PPM  . Cancer   . Arthritis   . Stroke    Past Surgical History  Procedure Date  . Thyroidectomy     due to cancer  . Total hip arthroplasty   . Abdominal hysterectomy   . Mastectomy   . Mitral valve repair   . Intraocular lens insertion     left eye implant  . Pacemaker placement     for tachy/brady syndrome    reports that she has quit smoking. She has never used smokeless tobacco. She reports that she does not drink alcohol or use illicit drugs. family history includes Breast cancer in an unspecified family member; Colon cancer in an unspecified family member; Diabetes in an unspecified family member; Heart disease in her mother; Hyperlipidemia in an unspecified family member; and Hypertension in an unspecified family  member. Allergies  Allergen Reactions  . Donepezil Hydrochloride     REACTION: heart fluttering  . Morphine Sulfate   . Oxycodone-Acetaminophen     REACTION: Hallucinations      Review of Systems see hpi     Objective:   Physical Exam  Physical Exam  Nursing note and vitals reviewed. Constitutional: Appears well-developed and well-nourished. No distress.  HENT: open clear Head: Normocephalic and atraumatic.  Right Ear: External ear normal.  Left Ear: External ear normal.  Eyes: Conjunctivae are normal. No scleral icterus.  Neck: Neck supple. NT Cardiovascular: Normal rate, regular rhythm and normal heart sounds.  Exam reveals no gallop and no friction rub.   3/6 sm at best heard at rsb Pulmonary/Chest: Effort normal and breath sounds normal. No respiratory distress. He has no wheezes. no rales.  Lymphadenopathy:    He has no cervical adenopathy.  Neurological:Alert.  Skin: Skin is warm and dry. Not diaphoretic.  Psychiatric: Has a normal mood and affect.        Assessment & Plan:

## 2011-09-29 NOTE — Assessment & Plan Note (Signed)
Obtain lipid/lft with next blood draw.

## 2011-10-13 LAB — PROTIME-INR: INR: 1.6 — AB (ref 0.9–1.1)

## 2011-10-18 ENCOUNTER — Telehealth: Payer: Self-pay | Admitting: *Deleted

## 2011-10-18 DIAGNOSIS — Z79899 Other long term (current) drug therapy: Secondary | ICD-10-CM

## 2011-10-18 NOTE — Telephone Encounter (Signed)
Call placed to [facility]. Spoke with Rita Miller she was informed of coumadin changes to 3 mg Tuesday, Thursday and Saturday. All other days she is to take 2 mg, and recheck PT/INR in one week. Information communicated also via faxed to [phone] in the form of care /instruction order.

## 2011-10-20 ENCOUNTER — Telehealth: Payer: Self-pay | Admitting: Internal Medicine

## 2011-10-20 NOTE — Telephone Encounter (Signed)
The director of Rita Miller called about the paperwork that was dropped off last week.  She says they have not received the paperwork that was dropped off last week.  This means that they are not in compliance with state regulations which require them to get the paperwork back from the doctor within 5 days.  She would like you to give her a call

## 2011-10-21 NOTE — Telephone Encounter (Signed)
Call placed to [phone], spoke Rita Miller. She states she is out of compliance with completing paperwork. She was informed the paperwork is being completed, and submitted and Dr Rodena Medin has received duplicates and sometimes triple of the same information. Rita Miller stated that she will send a transport aid to office on Mondays with paperwork that needs signing from the provider and will no longer have the nurses fax the information.

## 2011-10-24 ENCOUNTER — Telehealth: Payer: Self-pay | Admitting: *Deleted

## 2011-10-24 NOTE — Telephone Encounter (Signed)
Fax received from [facility] and [phone]) with results from patients PT/INR draw for 10/24/2011.  PT 15.5 INR 1.45  Coumadin 3 mg Tuesday, Thursday and Saturday Coumadin 2 mg Monday, Wednesday, Friday and Sunday.

## 2011-10-24 NOTE — Telephone Encounter (Signed)
inr 1.45. Change coumadin to 3mg  on tues, wed, thurs, sat and 2mg  on mon, Friday, Sunday. Recheck one week

## 2011-10-25 ENCOUNTER — Encounter: Payer: Self-pay | Admitting: Internal Medicine

## 2011-10-25 NOTE — Telephone Encounter (Signed)
Call placed to [facility] [phone], spoke with Eunice Blase, She was advised of coumadin change lab order per Dr Rodena Medin instructions,and has verbalized understanding.

## 2011-10-27 ENCOUNTER — Encounter: Payer: Self-pay | Admitting: Internal Medicine

## 2011-11-01 ENCOUNTER — Telehealth: Payer: Self-pay | Admitting: *Deleted

## 2011-11-01 NOTE — Telephone Encounter (Signed)
Fax received from [facility] at [phone] fax [phone] with patient PT/INR results from 10/31/2011.  PT 16.7 INR 1.57  Current dose of of Coumadin 3 mg Tues-Wed-Thur- Sat     2 mg  Mon- Fri- Sun

## 2011-11-01 NOTE — Telephone Encounter (Signed)
Change dose to 3mg  every day but Sunday. Recheck INR one week

## 2011-11-01 NOTE — Telephone Encounter (Signed)
Call placed to [facility] at  [phone], spoke with Jacki Cones she was advised per Dr Rodena Medin instructions.

## 2011-11-14 ENCOUNTER — Encounter: Payer: Medicare Other | Admitting: Internal Medicine

## 2011-11-17 ENCOUNTER — Ambulatory Visit (INDEPENDENT_AMBULATORY_CARE_PROVIDER_SITE_OTHER): Payer: Medicare Other | Admitting: Internal Medicine

## 2011-11-17 ENCOUNTER — Encounter: Payer: Self-pay | Admitting: Internal Medicine

## 2011-11-17 VITALS — BP 132/59 | HR 72 | Resp 18 | Ht 59.0 in | Wt 101.8 lb

## 2011-11-17 DIAGNOSIS — I4891 Unspecified atrial fibrillation: Secondary | ICD-10-CM

## 2011-11-17 DIAGNOSIS — I495 Sick sinus syndrome: Secondary | ICD-10-CM

## 2011-11-17 LAB — PACEMAKER DEVICE OBSERVATION
AL IMPEDENCE PM: 989 Ohm
ATRIAL PACING PM: 71
BATTERY VOLTAGE: 2.76 V
BRDY-0003RV: 130 {beats}/min
BRDY-0004RV: 130 {beats}/min
RV LEAD THRESHOLD: 0.875 V
VENTRICULAR PACING PM: 73

## 2011-11-17 NOTE — Progress Notes (Signed)
Primary Cardiologist:  Dr Jens Som  The patient presents today for routine electrophysiology followup.  Since last being seen in our clinic, the patient reports doing reasonably well.  She has severe AS and multiple comorbidities but appears stable.   She is without complaint today. Today, she denies symptoms of palpitations, chest pain, shortness of breath, lower extremity edema, dizziness, presyncope, syncope, or neurologic sequela.   Past Medical History  Diagnosis Date  . CAD (coronary artery disease)   . History of CVA (cerebrovascular accident)   . Atrial fibrillation     peristent  . Hyperlipemia   . COPD (chronic obstructive pulmonary disease)   . Epistaxis     recurrent  . Osteoporosis   . Hypothyroidism   . History of pulmonary embolism   . History of colonic diverticulitis   . History of breast cancer   . Asthma   . Iron deficiency anemia   . History of transfusion of whole blood   . LBBB (left bundle branch block)     history of  . Aortal stenosis     severe  . Pacemaker     bradycardia s/p pacemaker implant  . PAD (peripheral artery disease)     left leg occluded viabahn stents x 3 left SFA  . Tachycardia-bradycardia syndrome     s/p PPM  . Cancer   . Arthritis   . Stroke    Past Surgical History  Procedure Date  . Thyroidectomy     due to cancer  . Total hip arthroplasty   . Abdominal hysterectomy   . Mastectomy   . Mitral valve repair   . Intraocular lens insertion     left eye implant  . Pacemaker placement     for tachy/brady syndrome    Current Outpatient Prescriptions  Medication Sig Dispense Refill  . acetaminophen (TYLENOL) 325 MG tablet Take 650 mg by mouth every 6 (six) hours as needed.        Marland Kitchen albuterol (ACCUNEB) 1.25 MG/3ML nebulizer solution Take 1 ampule by nebulization every 6 (six) hours as needed.        Marland Kitchen aspirin 81 MG tablet Take 81 mg by mouth daily.        Marland Kitchen diltiazem (CARDIZEM CD) 240 MG 24 hr capsule Take 240 mg by mouth  daily.       . fentaNYL (DURAGESIC - DOSED MCG/HR) 12 MCG/HR Place 1 patch onto the skin every 3 (three) days.        . fluticasone (FLOVENT HFA) 44 MCG/ACT inhaler Inhale 1 puff into the lungs 2 (two) times daily.        Marland Kitchen levothyroxine (SYNTHROID, LEVOTHROID) 75 MCG tablet Take 75 mcg by mouth daily. Check pulse weekly       . lidocaine (LIDODERM) 5 % Place 1 patch onto the skin daily. Remove & Discard patch within 12 hours or as directed by MD       . megestrol (MEGACE) 400 MG/10ML suspension Take 10 mLs (400 mg total) by mouth daily.  600 mL  0  . Melatonin 3 MG TABS Take 3 mg by mouth at bedtime as needed.       Marland Kitchen NAMENDA 10 MG tablet Take 10 mg by mouth 2 (two) times daily.       Marland Kitchen omeprazole (PRILOSEC) 20 MG capsule 20 mg. Take one capsule daily. Take on and empty stomach- Do no crush.      . ondansetron (ZOFRAN-ODT) 8 MG disintegrating tablet Take 8 mg  by mouth every 4 (four) hours as needed.       . polyethylene glycol (MIRALAX / GLYCOLAX) packet Take 17 g by mouth daily. Mix 17gm in 6-8 ounces of liquid daily.       . Pseudoephedrine-DM-GG (ROBITUSSIN CF PO) Take 10 mLs by mouth every 8 (eight) hours as needed. Cough        . rivastigmine (EXELON) 4.6 mg/24hr Place 1 patch onto the skin daily. Rotate sides      . rosuvastatin (CRESTOR) 20 MG tablet Take 20 mg by mouth at bedtime.        Marland Kitchen SALINE NASAL SPRAY NA Place 0.65 % into the nose. One spray in each nostril twice a day for nose bleeds       . vitamin E 400 UNIT capsule Take 400 Units by mouth daily.        Marland Kitchen warfarin (COUMADIN) 2 MG tablet Take by mouth as directed. As of 11/01/2011  patient advised to take 3 mg everyday except Sunday take 2 mg and recheck PT/INR in one week.      . tiotropium (SPIRIVA) 18 MCG inhalation capsule Place 18 mcg into inhaler and inhale daily.        . valsartan (DIOVAN) 40 MG tablet Take 1 tablet (40 mg total) by mouth daily.  30 tablet  11    Allergies  Allergen Reactions  . Donepezil  Hydrochloride     REACTION: heart fluttering  . Morphine Sulfate   . Oxycodone-Acetaminophen     REACTION: Hallucinations    History   Social History  . Marital Status: Widowed    Spouse Name: N/A    Number of Children: N/A  . Years of Education: N/A   Occupational History  . Not on file.   Social History Main Topics  . Smoking status: Former Games developer  . Smokeless tobacco: Never Used  . Alcohol Use: No  . Drug Use: No  . Sexually Active: Not on file   Other Topics Concern  . 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 Breast cancer    . Colon cancer    . Diabetes    . Hyperlipidemia    . Hypertension    . Heart disease Mother     unknown heart disease   Physical Exam: Filed Vitals:   11/17/11 1549  BP: 132/59  Pulse: 72  Resp: 18  Height: 4\' 11"  (1.499 m)  Weight: 101 lb 12.8 oz (46.176 kg)    GEN- The patient is elderly and chronically ill, alert and oriented x 3 today.   Head- normocephalic, atraumatic Eyes-  Sclera clear, conjunctiva pink Ears- hearing intact Oropharynx- clear Neck- supple, no JVP Lymph- no cervical lymphadenopathy Lungs- Clear to ausculation bilaterally, normal work of breathing Chest-  R sided pacemaker pocket is well healed but tender Heart- Regular rate and rhythm,2/6 SEM LUSB which is late peaking, S2 is not audible today,   GI- soft, NT, ND, + BS Extremities- no clubbing, cyanosis, or edema  Pacemaker interrogation- reviewed in detail today,  See PACEART report  Assessment and Plan:

## 2011-11-17 NOTE — Assessment & Plan Note (Signed)
Normal pacemaker function See Pace Art report No changes today  

## 2011-11-17 NOTE — Patient Instructions (Signed)
Your physician wants you to follow-up in: 6 months in the device clinic and 12 months with Dr Allred You will receive a reminder letter in the mail two months in advance. If you don't receive a letter, please call our office to schedule the follow-up appointment.  

## 2011-11-17 NOTE — Assessment & Plan Note (Signed)
afib burden is 20% Continue coumadin   Return to device clinic in 6 months Follow-up with Dr Jens Som in May as scheduled.

## 2011-12-02 ENCOUNTER — Ambulatory Visit (INDEPENDENT_AMBULATORY_CARE_PROVIDER_SITE_OTHER): Payer: Medicare Other | Admitting: *Deleted

## 2011-12-02 DIAGNOSIS — I998 Other disorder of circulatory system: Secondary | ICD-10-CM

## 2011-12-02 DIAGNOSIS — I739 Peripheral vascular disease, unspecified: Secondary | ICD-10-CM

## 2011-12-12 ENCOUNTER — Telehealth: Payer: Self-pay | Admitting: *Deleted

## 2011-12-12 NOTE — Telephone Encounter (Signed)
Continue same dose. Recheck 2wks

## 2011-12-12 NOTE — Telephone Encounter (Signed)
Rita Miller with [facility] call to report patient current INR for 12/12/2011 is 2.3. Her previous reading was 3.3 on 12/05/2011. Her current dose of Coumadin is 3 mg Monday through Friday and 2 mg on  Saturday and Sunday. They are requesting dosing instructions. Patients daughter Rita Miller has declined to have patient treated by facility provider.

## 2011-12-12 NOTE — Telephone Encounter (Signed)
Call placed to [facility] spoke with Caddell , she was informed per Dr Rodena Medin instructions and has verbalize understanding. Patient to recheck INR 12/26/2011.

## 2011-12-15 ENCOUNTER — Telehealth: Payer: Self-pay | Admitting: *Deleted

## 2011-12-15 NOTE — Telephone Encounter (Signed)
Belenda Cruise with [facility] called and stated patient had sudden onset of fever today of 101.5. She was given tylenol and fever is down to 99.2. She stated patient has a mild non productive cough, and states patient is tolerated food today with no problems. She would like to know if Dr Rodena Medin would like for the patient to have a chest xray and monitor over the next 48 hours, or should she come in for appointment.

## 2011-12-15 NOTE — Telephone Encounter (Signed)
Call returned to [facility] at [phone]; spoke with Caddell. She was advised per Dr Rodena Medin instructions and has verbalized understanding. She asked if it was okay to call with CXR results in the morning, along with lab results. She was informed that should be fine.

## 2011-12-15 NOTE — Telephone Encounter (Signed)
Yes to cxr. Also cbc and ua pls. Dx-fever

## 2011-12-19 ENCOUNTER — Encounter (HOSPITAL_COMMUNITY): Payer: Self-pay

## 2011-12-19 ENCOUNTER — Emergency Department (HOSPITAL_COMMUNITY)
Admission: EM | Admit: 2011-12-19 | Discharge: 2011-12-19 | Disposition: A | Discharge status: Home / Self Care | Payer: Medicare Other | Attending: Emergency Medicine | Admitting: Emergency Medicine

## 2011-12-19 ENCOUNTER — Emergency Department (HOSPITAL_COMMUNITY): Payer: Medicare Other

## 2011-12-19 DIAGNOSIS — Z79899 Other long term (current) drug therapy: Secondary | ICD-10-CM | POA: Insufficient documentation

## 2011-12-19 DIAGNOSIS — R059 Cough, unspecified: Secondary | ICD-10-CM | POA: Insufficient documentation

## 2011-12-19 DIAGNOSIS — R05 Cough: Secondary | ICD-10-CM | POA: Insufficient documentation

## 2011-12-19 DIAGNOSIS — Z8679 Personal history of other diseases of the circulatory system: Secondary | ICD-10-CM | POA: Insufficient documentation

## 2011-12-19 DIAGNOSIS — I2510 Atherosclerotic heart disease of native coronary artery without angina pectoris: Secondary | ICD-10-CM | POA: Insufficient documentation

## 2011-12-19 DIAGNOSIS — J189 Pneumonia, unspecified organism: Secondary | ICD-10-CM

## 2011-12-19 DIAGNOSIS — Z8739 Personal history of other diseases of the musculoskeletal system and connective tissue: Secondary | ICD-10-CM | POA: Insufficient documentation

## 2011-12-19 DIAGNOSIS — J4489 Other specified chronic obstructive pulmonary disease: Secondary | ICD-10-CM | POA: Insufficient documentation

## 2011-12-19 DIAGNOSIS — Z853 Personal history of malignant neoplasm of breast: Secondary | ICD-10-CM | POA: Insufficient documentation

## 2011-12-19 DIAGNOSIS — J449 Chronic obstructive pulmonary disease, unspecified: Secondary | ICD-10-CM | POA: Insufficient documentation

## 2011-12-19 LAB — BASIC METABOLIC PANEL
BUN: 34 mg/dL — ABNORMAL HIGH (ref 6–23)
CO2: 19 mEq/L (ref 19–32)
Calcium: 7.9 mg/dL — ABNORMAL LOW (ref 8.4–10.5)
Chloride: 101 mEq/L (ref 96–112)
Creatinine, Ser: 1.33 mg/dL — ABNORMAL HIGH (ref 0.50–1.10)
GFR calc Af Amer: 43 mL/min — ABNORMAL LOW (ref >90)

## 2011-12-19 LAB — DIFFERENTIAL
Basophils Relative: 0 % (ref 0–1)
Eosinophils Relative: 0 % (ref 0–5)
Monocytes Absolute: 1.3 10*3/uL — ABNORMAL HIGH (ref 0.1–1.0)
Monocytes Relative: 6 % (ref 3–12)
Neutro Abs: 18 10*3/uL — ABNORMAL HIGH (ref 1.7–7.7)

## 2011-12-19 LAB — CBC
HCT: 29.4 % — ABNORMAL LOW (ref 36.0–46.0)
Hemoglobin: 10.4 g/dL — ABNORMAL LOW (ref 12.0–15.0)
MCHC: 35.4 g/dL (ref 30.0–36.0)
MCV: 89.6 fL (ref 78.0–100.0)
RDW: 14.4 % (ref 11.5–15.5)

## 2011-12-19 MED ORDER — LEVOFLOXACIN IN D5W 750 MG/150ML IV SOLN: 750.0000 mg | INTRAVENOUS | Status: DC

## 2011-12-19 MED ORDER — LEVOFLOXACIN IN D5W 750 MG/150ML IV SOLN
750.0000 mg | INTRAVENOUS | Status: DC
  Filled 2011-12-19: qty 150

## 2011-12-19 MED ORDER — ALBUTEROL SULFATE HFA 108 (90 BASE) MCG/ACT IN AERS: 1.0000 | INHALATION_SPRAY | Freq: Four times a day (QID) | RESPIRATORY_TRACT | Status: DC | PRN

## 2011-12-19 MED ORDER — CEFPODOXIME PROXETIL 200 MG PO TABS: 200.0000 mg | ORAL_TABLET | Freq: Two times a day (BID) | ORAL | Status: DC

## 2011-12-19 MED ORDER — LEVOFLOXACIN IN D5W 500 MG/100ML IV SOLN
500.0000 mg | INTRAVENOUS | Status: DC
  Filled 2011-12-19: qty 100

## 2011-12-19 NOTE — Telephone Encounter (Signed)
Call placed to [facility] at [phone];  Spoke with  Lawanna Kobus, she stated she will need to locate the results and call back.

## 2011-12-19 NOTE — Telephone Encounter (Signed)
Results of labs and cxr not resulted to office for review. Nursing contacted facility to check on patient and question why results not available. At that point cbc, ua, urine cx and cxr results forwarded for reviewed. Noted to have leukocytosis 19k and likely pulmonary consolidation. Given persistent fever, leukocytosis and possible pneumonia advised to transfer patient to local ED for further evaluation.

## 2011-12-19 NOTE — ED Notes (Signed)
Called report to [name] at [facility] - pt to be transported back by [facility]; pt aware of same - [facility] notified of need for transfer per secretary

## 2011-12-19 NOTE — ED Provider Notes (Signed)
History     CSN: [id number]  Arrival date & time 12/19/11  1758   First MD Initiated Contact with Patient 12/19/11 1808      Chief Complaint  Patient presents with  . Chest Pain    sent from [facility] for further eval of diffuse chest and abd pain - worse with palpation and deep inspiration     (Consider location/radiation/quality/duration/timing/severity/associated sxs/prior treatment) HPI Comments: Patient comes in today from nursing home with a chief complaint of nonproductive cough that began earlier today.  She also reports that she is having chest pain when she coughs.  Pain only present with coughing.  She denies any shortness of breath.  Denies any fever or chills.  She does have a history of COPD and reports that she quit smoking ten years ago.  She does not have an inhaler.  She uses oxygen at night at baseline and occasionally during the day.  She reports that she does not want to be admitted to the hospital and would like to return to her facility.  She denies any abdominal pain, nausea, or vomiting.  Patient also has a history of a fib and is on Warfarin daily.  Patient is a 78 y.o. female presenting with cough. The history is provided by the patient.  Cough This is a new problem. The problem has been gradually worsening. The cough is non-productive. There has been no fever. Associated symptoms include chest pain. Pertinent negatives include no chills, no rhinorrhea, no sore throat, no shortness of breath and no wheezing. She has tried nothing for the symptoms.    Past Medical History  Diagnosis Date  . CAD (coronary artery disease)   . History of CVA (cerebrovascular accident)   . Atrial fibrillation     peristent  . Hyperlipemia   . COPD (chronic obstructive pulmonary disease)   . Epistaxis     recurrent  . Osteoporosis   . Hypothyroidism   . History of pulmonary embolism   . History of colonic diverticulitis   . History of  breast cancer   . Asthma   . Iron deficiency anemia   . History of transfusion of whole blood   . LBBB (left bundle branch block)     history of  . Aortal stenosis     severe  . Pacemaker     bradycardia s/p pacemaker implant  . PAD (peripheral artery disease)     left leg occluded viabahn stents x 3 left SFA  . Tachycardia-bradycardia syndrome     s/p PPM  . Cancer   . Arthritis   . Stroke     Past Surgical History  Procedure Date  . Thyroidectomy     due to cancer  . Total hip arthroplasty   . Abdominal hysterectomy   . Mastectomy   . Mitral valve repair   . Intraocular lens insertion     left eye implant  . Pacemaker placement     for tachy/brady syndrome    Family History  Problem Relation Age of Onset  . Breast cancer    . Colon cancer    . Diabetes    . Hyperlipidemia    . Hypertension    . Heart disease Mother     unknown heart disease    History  Substance Use Topics  . Smoking status: Former Games developer  . Smokeless tobacco: Never Used  . Alcohol Use: No    OB History  Grav Para Term Preterm Abortions TAB SAB Ect Mult Living                  Review of Systems  Constitutional: Negative for fever, chills and diaphoresis.  HENT: Negative for congestion, sore throat and rhinorrhea.   Respiratory: Positive for cough. Negative for shortness of breath and wheezing.   Cardiovascular: Positive for chest pain.  Gastrointestinal: Negative for nausea and vomiting.  Genitourinary: Negative for dysuria.  Skin: Negative for rash.  Neurological: Negative for dizziness, syncope and light-headedness.    Allergies  Donepezil hydrochloride; Morphine sulfate; and Oxycodone-acetaminophen  Home Medications   Current Outpatient Rx  Name Route Sig Dispense Refill  . ACETAMINOPHEN 325 MG PO TABS Oral Take 650 mg by mouth every 6 (six) hours as needed.      Marland Kitchen DILTIAZEM HCL ER COATED BEADS 240 MG PO CP24 Oral Take 240 mg by mouth daily.     Marland Kitchen FLUTICASONE  PROPIONATE  HFA 44 MCG/ACT IN AERO Inhalation Inhale 1 puff into the lungs 2 (two) times daily.      Marland Kitchen LEVOTHYROXINE SODIUM 75 MCG PO TABS Oral Take 75 mcg by mouth daily. Check pulse weekly     . MELATONIN 3 MG PO TABS Oral Take 3 mg by mouth at bedtime as needed.     Marland Kitchen NAMENDA 10 MG PO TABS Oral Take 10 mg by mouth 2 (two) times daily.     Marland Kitchen OMEPRAZOLE 20 MG PO CPDR  20 mg. Take one capsule daily. Take on and empty stomach- Do no crush.    Marland Kitchen PHENYLEPHRINE HCL 0.25 % NA SOLN Nasal Place 1 spray into the nose every 12 (twelve) hours as needed. For congestion    . POLYETHYLENE GLYCOL 3350 PO PACK Oral Take 17 g by mouth daily. Mix 17gm in 6-8 ounces of liquid daily.     Marland Kitchen RIVASTIGMINE 9.5 MG/24HR TD PT24 Transdermal Place 1 patch onto the skin daily.    Marland Kitchen ROSUVASTATIN CALCIUM 20 MG PO TABS Oral Take 20 mg by mouth at bedtime.      Marland Kitchen SALINE NASAL SPRAY NA Nasal Place 0.65 % into the nose. One spray in each nostril twice a day for nose bleeds     . TIOTROPIUM BROMIDE MONOHYDRATE 18 MCG IN CAPS Inhalation Place 18 mcg into inhaler and inhale daily.      Marland Kitchen VALSARTAN 40 MG PO TABS Oral Take 40 mg by mouth daily.    Marland Kitchen VITAMIN E 400 UNITS PO CAPS Oral Take 400 Units by mouth daily.      . WARFARIN SODIUM 2 MG PO TABS Oral Take 2 mg by mouth daily. Only on Saturdays and Sundays    . WARFARIN SODIUM 3 MG PO TABS Oral Take 3 mg by mouth daily. Not on Saturdays and Sundays \    . ONDANSETRON 8 MG PO TBDP Oral Take 8 mg by mouth every 4 (four) hours as needed.     Lenn Sink CF PO Oral Take 10 mLs by mouth every 8 (eight) hours as needed. Cough      . VALSARTAN 40 MG PO TABS Oral Take 1 tablet (40 mg total) by mouth daily. 30 tablet 11    BP 124/53  Pulse 93  Temp 98.8 F (37.1 C)  Resp 20  SpO2 94%  Physical Exam  Nursing note and vitals reviewed. Constitutional: She appears well-developed and well-nourished. No distress.  HENT:  Head: Normocephalic and atraumatic.  Mouth/Throat: Oropharynx is  clear and moist.  Neck: Normal range of motion. Neck supple.  Cardiovascular: Normal rate, regular rhythm and normal heart sounds.  Exam reveals no gallop and no friction rub.   No murmur heard. Pulmonary/Chest: No accessory muscle usage. Tachypnea noted. No respiratory distress. She has no wheezes. She has rhonchi. She has no rales.  Abdominal: Soft. Bowel sounds are normal. She exhibits no distension and no mass. There is no tenderness. There is no rigidity, no rebound and no guarding.  Neurological: She is alert.  Skin: Skin is warm and dry. She is not diaphoretic.  Psychiatric: She has a normal mood and affect.    ED Course  Procedures (including critical care time)   Labs Reviewed  CBC  DIFFERENTIAL  BASIC METABOLIC PANEL   No results found.   No diagnosis found.   Date: 12/20/2011  Rate: 83  Rhythm: atrial fibrillation  QRS Axis: normal  Intervals: a fib  ST/T Wave abnormalities: normal  Conduction Disutrbances:none  Narrative Interpretation:   Old EKG Reviewed: a fib not present on old EKG    MDM  Patient with pleuritic chest pain and nonproductive cough that began earlier today.  CXR demonstrating Pneumonia.  Patient does not want to be admitted and would like to return to her Nursing home.  Patient will be treated with antibiotic outpatient and given an Albuterol inhaler.  INR found to be 6.0 today.  Patient instructed to not take Coumadin for the next 3 days and then to have INR rechecked at that time.  Return precautions discussed with patient.  She verbalizes understanding.        Pascal Lux [place], PA-C 12/20/11 1616

## 2011-12-19 NOTE — ED Notes (Signed)
Placed call to [facility] for transport back home 

## 2011-12-19 NOTE — Discharge Instructions (Signed)
Do not take Coumadin for the next three days.  Have INR rechecked on Thursday and determine dosing from there.  Use albuterol inhaler 1-2 puffs every 4-6 hours Take antibiotic as prescribed.

## 2011-12-19 NOTE — ED Provider Notes (Signed)
Medical screening examination/treatment/procedure(s) were conducted as a shared visit with non-physician practitioner(s) and myself.  I personally evaluated the patient during the encounter  Patient was short term onset of pruritic chest pain. She has had cough that is nonproductive. Lungs with generalized rhonchi; no wheezes. Pt is alert and calm. BP and pulse normal. Borderline low pulse ox.   MDM : Patient desires to return to her facility. Doubt sepsis, metabolic instability or impending vascular collapse. She can be treated expectantly with observation at her facility. Coumadin will need to be held for 3 days. We'll treat pneumonia with a cephalosporin.    Flint Melter, MD 12/20/11 Ernestina [place]

## 2011-12-19 NOTE — ED Notes (Signed)
CRITICAL INR of 6.00 called to me per lab personnel

## 2011-12-19 NOTE — Telephone Encounter (Signed)
Voice message received from [place]er at [facility] at 4:40 pm. Her message stated that she was notified by Lawanna Kobus there results were faxed to office. She stated she was calling to report patient is still running low grade temp of 99.5, her O2 saturation is 96% on oxygen-liters not stated in message, pulse is 67 , "blood pressure is good", respirations 40 with shortness of breathe.  After speaking with Dr Rodena Medin; Call was returned to [facility] at [facility] at [phone], she was advised per Dr Rodena Medin to take patient to ER for evaluation. She was asked about the results and why the office was not contacted and the results not received on Friday. [name] stated that she had prepared the results for faxing, but did not know why there were not forwarded to the office.

## 2011-12-20 ENCOUNTER — Telehealth: Payer: Self-pay | Admitting: Internal Medicine

## 2011-12-20 NOTE — Telephone Encounter (Signed)
Call placed to [facility] at [phone]; spoke with Belenda Cruise. She was informed per  Dr Rodena Medin instructions, verbalized understanding, and agrees as instructions.

## 2011-12-20 NOTE — Telephone Encounter (Signed)
Yes BUT needs to monitored very closely. If worsens any (dyspnea, mental status, o2 sats dropping, fevers not resolving, etc) then will need to consider returning to hospital for admission. Also if doesn't get admitted will need hospital f/u next week to re-evaluate

## 2011-12-20 NOTE — Telephone Encounter (Signed)
Belenda Cruise with [facility] called and left voice message stating patient was seen in the ER. She was diagnosed with Pneumonia given antibiotics, advised to hold coumadin for 3 days and recheck PT/INR on Thursday. Krisitin would like to know if Dr Rodena Medin is in agreement with those instructions.

## 2011-12-22 ENCOUNTER — Ambulatory Visit (INDEPENDENT_AMBULATORY_CARE_PROVIDER_SITE_OTHER): Payer: Medicare Other | Admitting: Cardiology

## 2011-12-22 ENCOUNTER — Encounter: Payer: Self-pay | Admitting: Cardiology

## 2011-12-22 ENCOUNTER — Telehealth: Payer: Self-pay | Admitting: *Deleted

## 2011-12-22 VITALS — BP 122/60 | HR 70 | Wt 101.0 lb

## 2011-12-22 DIAGNOSIS — I2510 Atherosclerotic heart disease of native coronary artery without angina pectoris: Secondary | ICD-10-CM

## 2011-12-22 DIAGNOSIS — N390 Urinary tract infection, site not specified: Secondary | ICD-10-CM | POA: Insufficient documentation

## 2011-12-22 DIAGNOSIS — J189 Pneumonia, unspecified organism: Secondary | ICD-10-CM

## 2011-12-22 DIAGNOSIS — I4891 Unspecified atrial fibrillation: Secondary | ICD-10-CM

## 2011-12-22 DIAGNOSIS — Z9889 Other specified postprocedural states: Secondary | ICD-10-CM

## 2011-12-22 DIAGNOSIS — Z950 Presence of cardiac pacemaker: Secondary | ICD-10-CM

## 2011-12-22 DIAGNOSIS — E785 Hyperlipidemia, unspecified: Secondary | ICD-10-CM

## 2011-12-22 DIAGNOSIS — I359 Nonrheumatic aortic valve disorder, unspecified: Secondary | ICD-10-CM

## 2011-12-22 NOTE — Assessment & Plan Note (Signed)
Continue statin. Lipids and liver monitored by primary care. 

## 2011-12-22 NOTE — Assessment & Plan Note (Signed)
Continue Coumadin and beta blocker. 

## 2011-12-22 NOTE — Assessment & Plan Note (Signed)
Patient does not have symptoms. She denies increased dyspnea, chest pain or syncope. I am not convinced that she will ever be a good candidate for aortic valve replacement. She is also not clear she would ever be agreeable to surgery. We will plan followup echoes in the future.

## 2011-12-22 NOTE — Assessment & Plan Note (Signed)
Management per electrophysiology. 

## 2011-12-22 NOTE — Assessment & Plan Note (Signed)
Patient recently seen for pneumonia in the emergency room. Chest x-ray abnormal. She is being treated with antibiotics. She will followup with primary care for this issue.

## 2011-12-22 NOTE — Telephone Encounter (Signed)
Vitamin k 2.5mg  today now. Continue to hold coumadin. Monitor for any signs of bleeding which would prompt ED transfer. Stat inr tomorrow to make sure not increasing further. Also status update on pneumonia(?fever, cough, dyspnea, mental status, po intake) and confirmation has appt mon or tues.

## 2011-12-22 NOTE — Telephone Encounter (Signed)
Call placed to  [facility] at [phone], spoke with  Eunice Blase, she was advised per Dr Rodena Medin instructions. She stated patient was not at the facility at present, she is at a doctors appointment (cardiology). Eunice Blase stated she was informed patient does not have a fever, there is no cough, and her fluid intake ok. She was asked to confirm patients upcoming appointment for 12/28/2011. Eunice Blase stated that she could not confirm the appointment at this time because the appointment book was not available to her.  She stated that she will make sure the patients appointment will be noted.

## 2011-12-22 NOTE — Patient Instructions (Signed)
Your physician wants you to follow-up in: 6 MONTHS WITH DR CRENSHAW You will receive a reminder letter in the mail two months in advance. If you don't receive a letter, please call our office to schedule the follow-up appointment.  

## 2011-12-22 NOTE — Assessment & Plan Note (Signed)
Continue SBE prophylaxis. 

## 2011-12-22 NOTE — Assessment & Plan Note (Signed)
Continue statin. Not on aspirin given need for Coumadin. 

## 2011-12-22 NOTE — Progress Notes (Signed)
HPI: Pleasant female with past medical history of atrial fibrillation, prior pacemaker, coronary artery disease, aortic stenosis, mitral regurgitation s/p MV repair (apparently felt not to be a candidate for AVR at the time) for fu. Last Myoview was performed in May of 2010 and showed normal perfusion and an ejection fraction of 53%. Last echocardiogram was performed in Oct 2012 and showed an ejection fraction of 55-60%, severe aortic stenosis with a mean gradient of 52 mm of mercury, mild aortic insufficiency, status post mitral valve repair with mild mitral stenosis and mild mitral regurgitation and mildly elevated pulmonic pressures. The left atrium was severely dilated. Patient also with severe PVD and felt not to be an operative candiate by vasc surgery. Since I last saw her in Nov 2012, she was seen in emergency room 3 days ago for pneumonia. She was placed on antibiotics. She states she has improved. She presently denies dyspnea, chest pain or syncope.   Current Outpatient Prescriptions  Medication Sig Dispense Refill  . acetaminophen (TYLENOL) 325 MG tablet Take 650 mg by mouth every 6 (six) hours as needed.        Marland Kitchen albuterol (PROVENTIL HFA;VENTOLIN HFA) 108 (90 BASE) MCG/ACT inhaler Inhale 1-2 puffs into the lungs every 6 (six) hours as needed for wheezing.  1 Inhaler  0  . cefpodoxime (VANTIN) 200 MG tablet Take 1 tablet (200 mg total) by mouth 2 (two) times daily.  20 tablet  0  . diltiazem (CARDIZEM CD) 240 MG 24 hr capsule Take 240 mg by mouth daily.       . fluticasone (FLOVENT HFA) 44 MCG/ACT inhaler Inhale 1 puff into the lungs 2 (two) times daily.        Marland Kitchen levothyroxine (SYNTHROID, LEVOTHROID) 75 MCG tablet Take 75 mcg by mouth daily. Check pulse weekly       . Melatonin 3 MG TABS Take 3 mg by mouth at bedtime as needed.       Marland Kitchen NAMENDA 10 MG tablet Take 10 mg by mouth daily.       Marland Kitchen omeprazole (PRILOSEC) 20 MG capsule 20 mg. Take one capsule daily. Take on and empty stomach- Do no  crush.      . ondansetron (ZOFRAN-ODT) 8 MG disintegrating tablet Take 8 mg by mouth every 4 (four) hours as needed.       . phenylephrine (NEO-SYNEPHRINE) 0.25 % nasal spray Place 1 spray into the nose every 12 (twelve) hours as needed. For congestion      . polyethylene glycol (MIRALAX / GLYCOLAX) packet Take 17 g by mouth daily. Mix 17gm in 6-8 ounces of liquid daily.       . Pseudoephedrine-DM-GG (ROBITUSSIN CF PO) Take 10 mLs by mouth every 8 (eight) hours as needed. Cough        . rivastigmine (EXELON) 9.5 mg/24hr Place 1 patch onto the skin daily.      . rosuvastatin (CRESTOR) 20 MG tablet Take 20 mg by mouth at bedtime.        Marland Kitchen SALINE NASAL SPRAY NA Place 0.65 % into the nose. One spray in each nostril twice a day for nose bleeds       . tiotropium (SPIRIVA) 18 MCG inhalation capsule Place 18 mcg into inhaler and inhale daily.        . valsartan (DIOVAN) 40 MG tablet Take 40 mg by mouth daily.      . vitamin E 400 UNIT capsule Take 400 Units by mouth daily.        Marland Kitchen  warfarin (COUMADIN) 2 MG tablet Take 2 mg by mouth as directed.       . warfarin (COUMADIN) 3 MG tablet Take 3 mg by mouth as directed. Not on Saturdays and Sundays \      . valsartan (DIOVAN) 40 MG tablet Take 1 tablet (40 mg total) by mouth daily.  30 tablet  11     Past Medical History  Diagnosis Date  . CAD (coronary artery disease)   . History of CVA (cerebrovascular accident)   . Atrial fibrillation     peristent  . Hyperlipemia   . COPD (chronic obstructive pulmonary disease)   . Epistaxis     recurrent  . Osteoporosis   . Hypothyroidism   . History of pulmonary embolism   . History of colonic diverticulitis   . History of breast cancer   . Asthma   . Iron deficiency anemia   . History of transfusion of whole blood   . LBBB (left bundle branch block)     history of  . Aortal stenosis     severe  . Pacemaker     bradycardia s/p pacemaker implant  . PAD (peripheral artery disease)     left leg  occluded viabahn stents x 3 left SFA  . Tachycardia-bradycardia syndrome     s/p PPM  . Cancer   . Arthritis   . Stroke     Past Surgical History  Procedure Date  . Thyroidectomy     due to cancer  . Total hip arthroplasty   . Abdominal hysterectomy   . Mastectomy   . Mitral valve repair   . Intraocular lens insertion     left eye implant  . Pacemaker placement     for tachy/brady syndrome    History   Social History  . Marital Status: Widowed    Spouse Name: N/A    Number of Children: N/A  . Years of Education: N/A   Occupational History  . Not on file.   Social History Main Topics  . Smoking status: Former Games developer  . Smokeless tobacco: Never Used  . Alcohol Use: No  . Drug Use: No  . Sexually Active: Not on file   Other Topics Concern  . Not on file   Social History Narrative   RetiredWidow 1 daughter          ROS: no fevers or chills, productive cough, hemoptysis, dysphasia, odynophagia, melena, hematochezia, dysuria, hematuria, rash, seizure activity, orthopnea, PND, pedal edema, claudication. Remaining systems are negative.  Physical Exam: Well-developed frail in no acute distress.  Skin is warm and dry.  HEENT is normal.  Neck is supple.  Chest is clear to auscultation with normal expansion.  Cardiovascular exam is irregular,  3/6 systolic murmur left sternal border Abdominal exam nontender or distended. No masses palpated. Extremities show no edema. neuro grossly intact  ECG 12/19/11 - atrial flutter with controlled ventricular response.

## 2011-12-22 NOTE — Telephone Encounter (Signed)
Debbie with [facility] called and left voice message stating patient PT/INR  7.7. Debbie stated patient coumadin is currently on hold and they are awaiting instructions.

## 2011-12-23 ENCOUNTER — Telehealth: Payer: Self-pay | Admitting: *Deleted

## 2011-12-23 NOTE — Telephone Encounter (Signed)
Call placed to [facility]  At [phone] spoke with Lawanna Kobus; she reported patients result  PT/INR is 4.1

## 2011-12-23 NOTE — Telephone Encounter (Signed)
continue to hold coumadin today and tomorrow. Sunday start 1mg  qd. Recheck inr monday

## 2011-12-23 NOTE — Telephone Encounter (Signed)
Call returned to [facility] at [phone], spoke with Lawanna Kobus she was informed per Dr Rodena Medin instructions and has verbalized understanding.

## 2011-12-26 ENCOUNTER — Telehealth: Payer: Self-pay | Admitting: Internal Medicine

## 2011-12-26 ENCOUNTER — Telehealth: Payer: Self-pay | Admitting: *Deleted

## 2011-12-26 NOTE — Telephone Encounter (Signed)
Call placed to [facility] at [phone]; spoke with Kipp Brood, she was informed per Dr Rodena Medin instructions and has verbalized understanding.

## 2011-12-26 NOTE — Telephone Encounter (Signed)
Debbie with [facility] called to report patients PT/INR results is 2.2. She is currently taking 1 mg of coumadin daily.

## 2011-12-26 NOTE — Telephone Encounter (Signed)
Addressed. See phone note from 12/26/2011.

## 2011-12-26 NOTE — Telephone Encounter (Signed)
Angel from the nursing home called with PT INR results---- 2.2  She said that if you need to call back you can ask for her or [name].

## 2011-12-26 NOTE — Telephone Encounter (Signed)
Change dose to 2mg  qd. Recheck inr thurs

## 2011-12-28 ENCOUNTER — Encounter: Payer: Self-pay | Admitting: Internal Medicine

## 2011-12-28 ENCOUNTER — Ambulatory Visit (INDEPENDENT_AMBULATORY_CARE_PROVIDER_SITE_OTHER): Payer: Medicare Other | Admitting: Internal Medicine

## 2011-12-28 VITALS — BP 90/60 | HR 86 | Temp 97.9°F | Resp 18 | Wt 100.0 lb

## 2011-12-28 DIAGNOSIS — Z79899 Other long term (current) drug therapy: Secondary | ICD-10-CM

## 2011-12-28 DIAGNOSIS — Z7901 Long term (current) use of anticoagulants: Secondary | ICD-10-CM

## 2011-12-28 DIAGNOSIS — J189 Pneumonia, unspecified organism: Secondary | ICD-10-CM

## 2011-12-28 DIAGNOSIS — E039 Hypothyroidism, unspecified: Secondary | ICD-10-CM

## 2011-12-28 NOTE — Assessment & Plan Note (Signed)
Continue vantin. Obtain f/u cxr 3 wks.

## 2011-12-28 NOTE — Assessment & Plan Note (Signed)
Obtain pt/inr

## 2011-12-28 NOTE — Progress Notes (Signed)
  Subjective:    Patient ID: Rita Miller, female    DOB: [birth year], 78 y.o.   MRN: [id number]  HPI Pt presents to clinic for followup of multiple medical problems. Recently dx'ed with pneumonia and evaluated in ED but not admitted. Discharged on vantin. Course complicated by leukocytosis and mild hyponatremia. Scheduled for cbc tomorrow by H/O for f/u of breast ca. Denies fever, chills, dyspnea or cough. No active complaint.  Past Medical History  Diagnosis Date  . CAD (coronary artery disease)   . History of CVA (cerebrovascular accident)   . Atrial fibrillation     peristent  . Hyperlipemia   . COPD (chronic obstructive pulmonary disease)   . Epistaxis     recurrent  . Osteoporosis   . Hypothyroidism   . History of pulmonary embolism   . History of colonic diverticulitis   . History of breast cancer   . Asthma   . Iron deficiency anemia   . History of transfusion of whole blood   . LBBB (left bundle branch block)     history of  . Aortal stenosis     severe  . Pacemaker     bradycardia s/p pacemaker implant  . PAD (peripheral artery disease)     left leg occluded viabahn stents x 3 left SFA  . Tachycardia-bradycardia syndrome     s/p PPM  . Cancer   . Arthritis   . Stroke    Past Surgical History  Procedure Date  . Thyroidectomy     due to cancer  . Total hip arthroplasty   . Abdominal hysterectomy   . Mastectomy   . Mitral valve repair   . Intraocular lens insertion     left eye implant  . Pacemaker placement     for tachy/brady syndrome    reports that she has quit smoking. She has never used smokeless tobacco. She reports that she does not drink alcohol or use illicit drugs. family history includes Breast cancer in an unspecified family member; Colon cancer in an unspecified family member; Diabetes in an unspecified family member; Heart disease in her mother; Hyperlipidemia in an unspecified family member; and Hypertension in an unspecified family  member. Allergies  Allergen Reactions  . Donepezil Hydrochloride     REACTION: heart fluttering  . Morphine Sulfate   . Oxycodone-Acetaminophen     REACTION: Hallucinations      Review of Systems see hpi     Objective:   Physical Exam  Nursing note and vitals reviewed. Constitutional: She appears well-developed and well-nourished.  HENT:  Head: Normocephalic and atraumatic.  Eyes: Conjunctivae are normal. No scleral icterus.  Neck: Neck supple. No JVD present.  Cardiovascular: Normal rate and regular rhythm.   Murmur heard. Pulmonary/Chest: Effort normal and breath sounds normal. No respiratory distress. She has no wheezes. She has no rales.  Neurological: She is alert.  Skin: Skin is warm and dry.  Psychiatric: She has a normal mood and affect.          Assessment & Plan:

## 2011-12-28 NOTE — Assessment & Plan Note (Signed)
Obtain tsh/free t4 

## 2011-12-29 ENCOUNTER — Telehealth: Payer: Self-pay | Admitting: Hematology & Oncology

## 2011-12-29 ENCOUNTER — Telehealth: Payer: Self-pay | Admitting: *Deleted

## 2011-12-29 ENCOUNTER — Ambulatory Visit (HOSPITAL_BASED_OUTPATIENT_CLINIC_OR_DEPARTMENT_OTHER): Payer: Medicare Other | Admitting: Hematology & Oncology

## 2011-12-29 ENCOUNTER — Other Ambulatory Visit (HOSPITAL_BASED_OUTPATIENT_CLINIC_OR_DEPARTMENT_OTHER): Payer: Medicare Other | Admitting: Lab

## 2011-12-29 VITALS — BP 102/58 | HR 87 | Temp 97.0°F | Ht 59.0 in | Wt 100.0 lb

## 2011-12-29 DIAGNOSIS — D509 Iron deficiency anemia, unspecified: Secondary | ICD-10-CM

## 2011-12-29 DIAGNOSIS — N644 Mastodynia: Secondary | ICD-10-CM

## 2011-12-29 DIAGNOSIS — Z853 Personal history of malignant neoplasm of breast: Secondary | ICD-10-CM

## 2011-12-29 DIAGNOSIS — F0390 Unspecified dementia without behavioral disturbance: Secondary | ICD-10-CM

## 2011-12-29 LAB — CBC WITH DIFFERENTIAL (CANCER CENTER ONLY)
BASO%: 1.1 % (ref 0.0–2.0)
EOS%: 1.2 % (ref 0.0–7.0)
HCT: 29 % — ABNORMAL LOW (ref 34.8–46.6)
LYMPH%: 19.6 % (ref 14.0–48.0)
MCH: 31.5 pg (ref 26.0–34.0)
MCHC: 31.7 g/dL — ABNORMAL LOW (ref 32.0–36.0)
MCV: 99 fL (ref 81–101)
MONO#: 0.6 10*3/uL (ref 0.1–0.9)
MONO%: 7.8 % (ref 0.0–13.0)
NEUT%: 70.3 % (ref 39.6–80.0)
RDW: 15.4 % (ref 11.1–15.7)

## 2011-12-29 LAB — HEPATIC FUNCTION PANEL
ALT: 46 U/L — ABNORMAL HIGH (ref 0–35)
Alkaline Phosphatase: 123 U/L — ABNORMAL HIGH (ref 39–117)
Indirect Bilirubin: 0.3 mg/dL (ref 0.0–0.9)
Total Protein: 6.1 g/dL (ref 6.0–8.3)

## 2011-12-29 LAB — BASIC METABOLIC PANEL
BUN: 20 mg/dL (ref 6–23)
Chloride: 106 mEq/L (ref 96–112)
Creat: 1.03 mg/dL (ref 0.50–1.10)
Glucose, Bld: 56 mg/dL — ABNORMAL LOW (ref 70–99)
Potassium: 4.9 mEq/L (ref 3.5–5.3)

## 2011-12-29 LAB — IRON AND TIBC: TIBC: 243 ug/dL — ABNORMAL LOW (ref 250–470)

## 2011-12-29 NOTE — Telephone Encounter (Signed)
Belenda Cruise with [name] called and left voice message checking on the status of patients test results

## 2011-12-29 NOTE — Telephone Encounter (Signed)
See lab result note.

## 2011-12-29 NOTE — Telephone Encounter (Signed)
Notes Recorded by Edwyna Perfect, MD on 12/29/2011 at 3:06 PM 1) WBC and sodium now nl 2) lft mildly elevated. Give order for repeat lft one month-dx abn lft 3) thyroid mildly underactive. Increase synthroid dose from to qd. Needs tsh/free t4 10-12 wks 4) INR 1.71 (was 2.2). Taking 2mg  qd. Change dose to 2mg  on m,w,f, sat and 3mg  on tues, thurs, sun. Recheck inr one week    Call placed to [facility] at [phone], spoke with Darl Pikes; she was advised per Dr Rodena Medin instructions and has verbalized understanding.

## 2011-12-29 NOTE — Telephone Encounter (Signed)
Pt aware of 01-17-12 [place] and mammogram left breast at 945am at [facility] fax [phone] and phone [phone]

## 2011-12-29 NOTE — Progress Notes (Signed)
This office note has been dictated.

## 2011-12-30 NOTE — Progress Notes (Signed)
CC:   [facility] and [employer] [place] [id number] Madolyn Frieze. Jens Som, MD, [facility] Sandford Craze, NP  DIAGNOSES: 1. Iron-deficiency anemia, recurrent. 2. Chronic left breast pain. 3. History of stage I ductal carcinoma of the right breast.  CURRENT THERAPY:  IV iron as indicated.  INTERIM HISTORY:  Mr. Russett comes in for followup.  Her dementia certainly has been a big problem for her.  She is at the nursing home. She seems to be doing okay at the nursing home.  She has not noted any obvious bleeding.  Her appetite has been okay. There has been no obvious change in bowel or bladder habits.  She does state that she has continued pain in the left breast.  Again, this is hard to say how much she pain she really has because of her dementia.  She had an ultrasound done last year.  We will go ahead and repeat an ultrasound of the left breast in a couple weeks.  She has had no leg swelling.  She really does not get around all that much.  She still wants to go back to [place] where her family lives.  PHYSICAL EXAMINATION:  This is a petite, elderly white female in no obvious distress.  Vital signs: 97, pulse 87, respiratory rate 18, blood pressure 102/58.  Weight is 100 pounds.  Head and neck:  Normocephalic, atraumatic skull.  There are no ocular or oral lesions.  There are no palpable cervical or supraclavicular lymph nodes.  Lungs:  Clear bilaterally.  Cardiac:  Regular rate and rhythm with a normal S1 and S2. She has a 2/6 systolic ejection murmur.  Abdomen:  Soft with good bowel sounds.  There is no fluid wave.  There is no palpable.  No palpable hepatosplenomegaly.  Breast: Some tenderness to palpation throughout the left breast.  There is no erythema or warmth of the left breast.  No distinct masses noted in the left breast.  There is no left axillary adenopathy.  Right chest wall shows well-healed mastectomy.  No right chest wall nodules were noted.  There is no right  axillary adenopathy.  LABORATORY STUDIES:  White cell count is 8.2, hemoglobin 9.2, hematocrit 29, platelet count 417.  MCV is 99.  Pro time is 20.7 seconds with an INR of 1.7.  IMPRESSION:  Ms. Williamson is a 79 year old white female with history of recurrent iron deficiency anemia.  She seems to be doing okay physically.  I still am not sure why she has this left breast pain.  Again, will get an ultrasound to make sure nothing is going on.  I suspect that she will need iron again.  We will have to get her set up with another dose of IV iron.  I will use Feraheme at 1020 mg.  Her last iron studies done back in February showed ferritin 223, iron saturation was 24%.  The ferritin, I believe, is probably borderline for her.  I will see Ms. Maya back in another couple of months.  Hopefully will see that her hemoglobin is improving.    ______________________________ Josph Macho, M.D. PRE/MEDQ  D:  12/29/2011  T:  12/30/2011  Job:  [id number]

## 2012-01-04 ENCOUNTER — Other Ambulatory Visit: Payer: Self-pay | Admitting: *Deleted

## 2012-01-04 ENCOUNTER — Telehealth: Payer: Self-pay | Admitting: *Deleted

## 2012-01-04 DIAGNOSIS — D509 Iron deficiency anemia, unspecified: Secondary | ICD-10-CM

## 2012-01-04 NOTE — Telephone Encounter (Signed)
Called patients nurse at rehab center to let them know that her iron is low and needs Feraheme in 1-2 weeks.  Gave nurse date and time to come for iron

## 2012-01-04 NOTE — Telephone Encounter (Signed)
Message copied by Anselm Jungling on Wed Jan 04, 2012  2:48 PM ------      Message from: Josph Macho      Created: Tue Jan 03, 2012  2:50 PM       Call nursing home- she has low iron.  She needs Feraheme 1020mg  in 1-2 weeks.  Please set up!!  pete

## 2012-01-05 ENCOUNTER — Telehealth: Payer: Self-pay | Admitting: *Deleted

## 2012-01-05 NOTE — Telephone Encounter (Signed)
Belenda Cruise with [facility] called and left voice message stating patients PT/INR is 1.6. Her current dose of coumadin  2 mg M,W,F, SA and 3 mg Tu, Th, Su.   They are requesting further instruction on dosing and recheck of PT/INR

## 2012-01-05 NOTE — Telephone Encounter (Signed)
Call placed to [facility] at [phone], spoke with Lonna Cobb. She was informed per Dr Rodena Medin instructions and has verbalized understanding

## 2012-01-05 NOTE — Telephone Encounter (Signed)
Take extra 2mg  today. Change to 3mg  tues, thurs, fri, sun. 2mg  other days. Recheck 1 week

## 2012-01-06 ENCOUNTER — Ambulatory Visit (HOSPITAL_BASED_OUTPATIENT_CLINIC_OR_DEPARTMENT_OTHER): Payer: Medicare Other

## 2012-01-06 VITALS — BP 114/61 | HR 82 | Temp 97.8°F

## 2012-01-06 DIAGNOSIS — D509 Iron deficiency anemia, unspecified: Secondary | ICD-10-CM

## 2012-01-06 MED ORDER — SODIUM CHLORIDE 0.9 % IV SOLN
1020.0000 mg | Freq: Once | INTRAVENOUS | Status: AC
  Administered 2012-01-06: 1020 mg via INTRAVENOUS
  Filled 2012-01-06: qty 34

## 2012-01-06 NOTE — Patient Instructions (Signed)
Ferumoxytol injection What is this medicine? FERUMOXYTOL is an iron complex. Iron is used to make healthy red blood cells, which carry oxygen and nutrients throughout the body. This medicine is used to treat iron deficiency anemia in people with chronic kidney disease. This medicine may be used for other purposes; ask your health care provider or pharmacist if you have questions. What should I tell my health care provider before I take this medicine? They need to know if you have any of these conditions: -anemia not caused by low iron levels -high levels of iron in the blood -magnetic resonance imaging (MRI) test scheduled -an unusual or allergic reaction to iron, other medicines, foods, dyes, or preservatives -pregnant or trying to get pregnant -breast-feeding How should I use this medicine? This medicine is for infusion into a vein. It is given by a health care professional in a hospital or clinic setting. Talk to your pediatrician regarding the use of this medicine in children. Special care may be needed. Overdosage: If you think you've taken too much of this medicine contact a poison control center or emergency room at once. Overdosage: If you think you have taken too much of this medicine contact a poison control center or emergency room at once. NOTE: This medicine is only for you. Do not share this medicine with others. What if I miss a dose? It is important not to miss your dose. Call your doctor or health care professional if you are unable to keep an appointment. What may interact with this medicine? This medicine may interact with the following medications: -other iron products This list may not describe all possible interactions. Give your health care provider a list of all the medicines, herbs, non-prescription drugs, or dietary supplements you use. Also tell them if you smoke, drink alcohol, or use illegal drugs. Some items may interact with your medicine. What should I watch  for while using this medicine? Visit your doctor or healthcare professional regularly. Tell your doctor or healthcare professional if your symptoms do not start to get better or if they get worse. You may need blood work done while you are taking this medicine. You may need to follow a special diet. Talk to your doctor. Foods that contain iron include: whole grains/cereals, dried fruits, beans, or peas, leafy green vegetables, and organ meats (liver, kidney). What side effects may I notice from receiving this medicine? Side effects that you should report to your doctor or health care professional as soon as possible: -allergic reactions like skin rash, itching or hives, swelling of the face, lips, or tongue -breathing problems -changes in blood pressure -feeling faint or lightheaded, falls -fever or chills -flushing, sweating, or hot feelings -swelling of the ankles or feet Side effects that usually do not require medical attention (Report these to your doctor or health care professional if they continue or are bothersome.): -diarrhea -headache -nausea, vomiting -stomach pain This list may not describe all possible side effects. Call your doctor for medical advice about side effects. You may report side effects to FDA at 1-800-FDA-1088. Where should I keep my medicine? This drug is given in a hospital or clinic and will not be stored at home. NOTE: This sheet is a summary. It may not cover all possible information. If you have questions about this medicine, talk to your doctor, pharmacist, or health care provider.  2012, Elsevier/[employer] Standard. (04/16/2008 9:48:25 PM) 

## 2012-01-09 ENCOUNTER — Ambulatory Visit: Payer: Medicare Other

## 2012-01-13 ENCOUNTER — Telehealth: Payer: Self-pay | Admitting: Internal Medicine

## 2012-01-13 NOTE — Telephone Encounter (Signed)
Angel with [facility] call to inform of patients PT/INR results. She report 2.5. She was informed per Dr Rodena Medin instructions to have patient continue with current dose and recheck PT/INR in one week. Lawanna Kobus has verbalized understanding and agrees as instructed.

## 2012-01-18 ENCOUNTER — Emergency Department (HOSPITAL_COMMUNITY): Payer: Medicare Other

## 2012-01-18 ENCOUNTER — Telehealth: Payer: Self-pay | Admitting: *Deleted

## 2012-01-18 ENCOUNTER — Encounter (HOSPITAL_COMMUNITY): Payer: Self-pay | Admitting: Family Medicine

## 2012-01-18 ENCOUNTER — Emergency Department (HOSPITAL_COMMUNITY)
Admission: EM | Admit: 2012-01-18 | Discharge: 2012-01-18 | Disposition: A | Discharge status: Home / Self Care | Payer: Medicare Other | Attending: Emergency Medicine | Admitting: Emergency Medicine

## 2012-01-18 DIAGNOSIS — Z79899 Other long term (current) drug therapy: Secondary | ICD-10-CM | POA: Insufficient documentation

## 2012-01-18 DIAGNOSIS — E785 Hyperlipidemia, unspecified: Secondary | ICD-10-CM | POA: Insufficient documentation

## 2012-01-18 DIAGNOSIS — J4489 Other specified chronic obstructive pulmonary disease: Secondary | ICD-10-CM | POA: Insufficient documentation

## 2012-01-18 DIAGNOSIS — I2510 Atherosclerotic heart disease of native coronary artery without angina pectoris: Secondary | ICD-10-CM | POA: Insufficient documentation

## 2012-01-18 DIAGNOSIS — Z8739 Personal history of other diseases of the musculoskeletal system and connective tissue: Secondary | ICD-10-CM | POA: Insufficient documentation

## 2012-01-18 DIAGNOSIS — J449 Chronic obstructive pulmonary disease, unspecified: Secondary | ICD-10-CM | POA: Insufficient documentation

## 2012-01-18 DIAGNOSIS — M810 Age-related osteoporosis without current pathological fracture: Secondary | ICD-10-CM | POA: Insufficient documentation

## 2012-01-18 DIAGNOSIS — J40 Bronchitis, not specified as acute or chronic: Secondary | ICD-10-CM | POA: Insufficient documentation

## 2012-01-18 DIAGNOSIS — Z8673 Personal history of transient ischemic attack (TIA), and cerebral infarction without residual deficits: Secondary | ICD-10-CM | POA: Insufficient documentation

## 2012-01-18 DIAGNOSIS — I4891 Unspecified atrial fibrillation: Secondary | ICD-10-CM | POA: Insufficient documentation

## 2012-01-18 LAB — BASIC METABOLIC PANEL
Chloride: 103 mEq/L (ref 96–112)
Creatinine, Ser: 1.05 mg/dL (ref 0.50–1.10)
GFR calc Af Amer: 57 mL/min — ABNORMAL LOW (ref >90)
Potassium: 4.3 mEq/L (ref 3.5–5.1)
Sodium: 136 mEq/L (ref 135–145)

## 2012-01-18 LAB — DIFFERENTIAL
Eosinophils Absolute: 0.2 10*3/uL (ref 0.0–0.7)
Lymphs Abs: 1.7 10*3/uL (ref 0.7–4.0)
Monocytes Relative: 14 % — ABNORMAL HIGH (ref 3–12)
Neutrophils Relative %: 60 % (ref 43–77)

## 2012-01-18 LAB — CBC
Hemoglobin: 11.1 g/dL — ABNORMAL LOW (ref 12.0–15.0)
MCH: 31.9 pg (ref 26.0–34.0)
RBC: 3.48 MIL/uL — ABNORMAL LOW (ref 3.87–5.11)

## 2012-01-18 MED ORDER — SODIUM CHLORIDE 0.9 % IV SOLN: INTRAVENOUS | Status: DC

## 2012-01-18 NOTE — Discharge Instructions (Signed)

## 2012-01-18 NOTE — ED Notes (Signed)
Patient refuses IV and states she wants to go home.

## 2012-01-18 NOTE — ED Notes (Signed)
Secretary to call [facility] to transport patient back to [facility].

## 2012-01-18 NOTE — Telephone Encounter (Signed)
Angel with [facility] call to stating patient has a repeat chest xray done today,and in comparison with the xray done on 12/15/2011 pnuemonia has worsened.    After speaking with Sandford Craze regarding patient status; she advised patient seek care in ER. Lawanna Kobus advised to have patient seek care in ER for possible hospitalization. Lawanna Kobus has verbalized understanding and agrees as instructed.

## 2012-01-18 NOTE — ED Notes (Signed)
Pt discharged via stretcher by [facility], paperwork given to [facility] by Mila Merry. [facility] left with pt prior to pt signing discharge signature pad. Pt INAD upon discharge back to facility.

## 2012-01-18 NOTE — ED Notes (Addendum)
Patient states she had a chest xray today and was told the pneumonia  was worse and the nursing home sent her to the hospital to further evaluation. Patient is resting with sats of 99% on RA with NAD. Patient states she has pain over her pacemaker when she pushes on it. Patient resting with NAD.

## 2012-01-18 NOTE — ED Notes (Signed)
Secretary to call [facility] to transport patient to [facility] in [place].

## 2012-01-18 NOTE — ED Provider Notes (Signed)
History     CSN: [id number]  Arrival date & time 01/18/12  1652   First MD Initiated Contact with Patient 01/18/12 1659      Chief Complaint  Patient presents with  . Pneumonia    (Consider location/radiation/quality/duration/timing/severity/associated sxs/prior treatment) HPI  Patient to the ED from assisted living facility for complaints of non resolving pneumonia. The patient was diagnosed with pneumonia of 12/18/2011 and chose to go back to the nursing facility and do PO antibiotics. The patient admits to having continued cough but is feeling better from her previous pneumonia. She still has a cough but denies feeling short of breath or having difficulty breathing. Her oxygen saturation is 98 % on room air, resp 16, pulse is 82. Pt is in NAD and hemodynamically stable.   The facility sent her to the ED because a chest xray was done and they were concerned for worsening pneumonia.  Past Medical History  Diagnosis Date  . CAD (coronary artery disease)   . History of CVA (cerebrovascular accident)   . Atrial fibrillation     peristent  . Hyperlipemia   . COPD (chronic obstructive pulmonary disease)   . Epistaxis     recurrent  . Osteoporosis   . Hypothyroidism   . History of pulmonary embolism   . History of colonic diverticulitis   . History of breast cancer   . Asthma   . Iron deficiency anemia   . History of transfusion of whole blood   . LBBB (left bundle branch block)     history of  . Aortal stenosis     severe  . Pacemaker     bradycardia s/p pacemaker implant  . PAD (peripheral artery disease)     left leg occluded viabahn stents x 3 left SFA  . Tachycardia-bradycardia syndrome     s/p PPM  . Cancer   . Arthritis   . Stroke     Past Surgical History  Procedure Date  . Thyroidectomy     due to cancer  . Total hip arthroplasty   . Abdominal hysterectomy   . Mastectomy   . Mitral valve repair   . Intraocular lens insertion     left eye implant  .  Pacemaker placement     for tachy/brady syndrome    Family History  Problem Relation Age of Onset  . Breast cancer    . Colon cancer    . Diabetes    . Hyperlipidemia    . Hypertension    . Heart disease Mother     unknown heart disease    History  Substance Use Topics  . Smoking status: Former Games developer  . Smokeless tobacco: Never Used  . Alcohol Use: No    OB History    Grav Para Term Preterm Abortions TAB SAB Ect Mult Living                  Review of Systems   HEENT: denies blurry vision or change in hearing PULMONARY: Denies difficulty breathing and SOB, pt is positive for a cough CARDIAC: denies chest pain or heart palpitations MUSCULOSKELETAL:  denies being unable to ambulate ABDOMEN AL: denies abdominal pain GU: denies loss of bowel or urinary control NEURO: denies numbness and tingling in extremities SKIN: no new rashes PSYCH: patient behavior is normal NECK: No neck pain     Allergies  Donepezil hydrochloride; Morphine sulfate; and Oxycodone-acetaminophen  Home Medications   Current Outpatient Rx  Name Route Sig Dispense  Refill  . DILTIAZEM HCL ER COATED BEADS 240 MG PO CP24 Oral Take 240 mg by mouth daily.     Marland Kitchen FLUTICASONE PROPIONATE  HFA 44 MCG/ACT IN AERO Inhalation Inhale 1 puff into the lungs 2 (two) times daily.      Marland Kitchen LEVOTHYROXINE SODIUM 88 MCG PO TABS Oral Take 88 mcg by mouth daily.    Marland Kitchen MELATONIN 3 MG PO TABS Oral Take 3 mg by mouth at bedtime as needed.     Marland Kitchen NAMENDA 10 MG PO TABS Oral Take 10 mg by mouth daily.     Marland Kitchen OMEPRAZOLE 20 MG PO CPDR  20 mg. Take one capsule daily. Take on and empty stomach- Do no crush.    Marland Kitchen POLYETHYLENE GLYCOL 3350 PO PACK Oral Take 17 g by mouth daily. Mix 17gm in 6-8 ounces of liquid daily.     Lenn Sink CF PO Oral Take 10 mLs by mouth every 8 (eight) hours as needed. Cough      . RIVASTIGMINE 9.5 MG/24HR TD PT24 Transdermal Place 1 patch onto the skin daily.    Marland Kitchen ROSUVASTATIN CALCIUM 20 MG PO TABS Oral  Take 20 mg by mouth at bedtime.      Marland Kitchen SALINE NASAL SPRAY NA Nasal Place 0.65 % into the nose. One spray in each nostril twice a day for nose bleeds     . TIOTROPIUM BROMIDE MONOHYDRATE 18 MCG IN CAPS Inhalation Place 18 mcg into inhaler and inhale daily.      Marland Kitchen VALSARTAN 40 MG PO TABS Oral Take 40 mg by mouth daily.    Marland Kitchen VITAMIN E 400 UNITS PO CAPS Oral Take 400 Units by mouth daily.      . WARFARIN SODIUM 2 MG PO TABS Oral Take 2 mg by mouth as directed. As of 01/05/2012-Take 3 mg Tue Thu Fri and Sun and 2 mg all other days.      BP 125/68  Pulse 82  Temp 97 F (36.1 C)  Resp 16  SpO2 97%  Physical Exam  Nursing note and vitals reviewed. Constitutional: She appears well-developed and well-nourished. No distress.  HENT:  Head: Normocephalic and atraumatic.  Eyes: Pupils are equal, round, and reactive to light.  Neck: Normal range of motion. Neck supple.  Cardiovascular: Normal rate and regular rhythm.   Pulmonary/Chest: Effort normal. She has no wheezes. She has no rales.  Abdominal: Soft. There is no tenderness. There is no rebound.  Neurological: She is alert.  Skin: Skin is warm and dry.    ED Course  Procedures (including critical care time)   Labs Reviewed  CBC  DIFFERENTIAL  BASIC METABOLIC PANEL   Dg Chest 2 View  01/18/2012  *RADIOLOGY REPORT*  Clinical Data: Worsening pneumonia.  Chest pain.  History right mastectomy and pacemaker.  CHEST - 2 VIEW  Comparison: 12/19/2011 and 03/04/2011.  Findings: Moderate osteopenia.  Hyperinflation.  Dual lead pacer with leads right atrium right ventricle.  Surgical clips in the right axilla.  Patient minimally rotated right.  Mild cardiomegaly. Atherosclerosis in the transverse aorta. No pleural effusion or pneumothorax.  Improved right upper and right middle lobe aeration. Mild somewhat linear opacity remains in the inferior right upper lobe.  Favored to represent scarring.  Diffuse peribronchial thickening.  No congestive failure.   IMPRESSION:  1.  Improved right-sided aeration.  Minimal remaining opacity is favored to represent scarring. 2.  COPD/chronic bronchitis. 3. Cardiomegaly without congestive failure.  Original Report Authenticated By: Consuello Bossier,  M.D.     1. Bronchitis       MDM  Patients xray has come back showing some improved changes from 12/19/2011. The patient states that she is feeling better and wants to go home right now. Patient vital signs are stable adn she is of sound mind to make that decision. Will ask facility that primary care doctor follow-up on patient within the next week.  Pt has been advised of the symptoms that warrant their return to the ED. Patient has voiced understanding and has agreed to follow-up with the PCP or specialist.         Dorthula Matas, PA 01/18/12 1932

## 2012-01-18 NOTE — ED Notes (Signed)
Pt from assisted living center. Pt sent here for worsening pneumonia. Chest xray confirmed.

## 2012-01-19 ENCOUNTER — Telehealth: Payer: Self-pay | Admitting: *Deleted

## 2012-01-19 NOTE — Telephone Encounter (Signed)
Call placed to [facility], spoke with [name], she was informed per Dr Rodena Medin instructions and has verbalized understanding. She stated that she will inform daughter per Dr Rodena Medin instructions.

## 2012-01-19 NOTE — Telephone Encounter (Signed)
1) same dose. Recheck 3 wks. 2) CXR reviewed from ED visit. Radiologist reading states that the xray is improved and in fact the only slight area they see is more likely left over scarring from the infection. Send them a copy of the cxr report please. If there are no sx's- worsening cough, dyspnea, fever, etc then do not recommend abx

## 2012-01-19 NOTE — Telephone Encounter (Signed)
Voice message received from Davis at [facility] stating patients PT/INR results were 2.4 for lab draw today. Her message stated patients current coumadin dose is  2 mg on M, W, Sat, and 3 mg all other days.  [name] would like to know if there are any changes in patients coumadin and when her next PT/INR should be drawn. Her message stated patients daughter was in to see patient today and she would like for Dr Rodena Medin to place patient on antibiotics due to no improvement in the CXR. Please advise.

## 2012-01-20 ENCOUNTER — Encounter: Payer: Self-pay | Admitting: Hematology & Oncology

## 2012-01-20 NOTE — ED Provider Notes (Signed)
Medical screening examination/treatment/procedure(s) were performed by non-physician practitioner and as supervising physician I was immediately available for consultation/collaboration.    Eulala Newcombe L Sevannah Madia, MD 01/20/12 1557 

## 2012-02-02 ENCOUNTER — Telehealth: Payer: Self-pay | Admitting: Internal Medicine

## 2012-02-02 NOTE — Telephone Encounter (Signed)
Same dose  Recheck 4 weeks

## 2012-02-02 NOTE — Telephone Encounter (Signed)
Angel from [facility] called and left a voice message stating patient PT/INR result from this morning is 2.2.

## 2012-02-02 NOTE — Telephone Encounter (Signed)
Call placed to [facility] at [phone], spoke with [facility]. She was advised per Dr Rodena Medin instructions and has verbalized understanding.

## 2012-02-16 ENCOUNTER — Encounter: Payer: Self-pay | Admitting: Internal Medicine

## 2012-02-16 ENCOUNTER — Ambulatory Visit (INDEPENDENT_AMBULATORY_CARE_PROVIDER_SITE_OTHER): Payer: Medicare Other | Admitting: Internal Medicine

## 2012-02-16 VITALS — BP 100/58 | HR 77 | Temp 98.2°F | Resp 16 | Wt 99.0 lb

## 2012-02-16 DIAGNOSIS — R451 Restlessness and agitation: Secondary | ICD-10-CM | POA: Insufficient documentation

## 2012-02-16 DIAGNOSIS — E039 Hypothyroidism, unspecified: Secondary | ICD-10-CM

## 2012-02-16 DIAGNOSIS — IMO0002 Reserved for concepts with insufficient information to code with codable children: Secondary | ICD-10-CM

## 2012-02-16 DIAGNOSIS — Z79899 Other long term (current) drug therapy: Secondary | ICD-10-CM

## 2012-02-16 LAB — CBC WITH DIFFERENTIAL/PLATELET
Basophils Absolute: 0.1 10*3/uL (ref 0.0–0.1)
HCT: 30.1 % — ABNORMAL LOW (ref 36.0–46.0)
Lymphocytes Relative: 14 % (ref 12–46)
Monocytes Absolute: 0.8 10*3/uL (ref 0.1–1.0)
Neutro Abs: 4.1 10*3/uL (ref 1.7–7.7)
RDW: 15.5 % (ref 11.5–15.5)
WBC: 5.9 10*3/uL (ref 4.0–10.5)

## 2012-02-16 LAB — BASIC METABOLIC PANEL
BUN: 21 mg/dL (ref 6–23)
Chloride: 108 mEq/L (ref 96–112)
Potassium: 3.8 mEq/L (ref 3.5–5.3)

## 2012-02-16 LAB — T4, FREE: Free T4: 1.43 ng/dL (ref 0.80–1.80)

## 2012-02-16 NOTE — Assessment & Plan Note (Signed)
Questionable change in baseline per family. Obtain labs including tsh and UA. Followup if no improvement or worsening.

## 2012-02-16 NOTE — Progress Notes (Signed)
  Subjective:    Patient ID: Rita Miller, female    DOB: 29-Dec-1929, 78 y.o.   MRN: [id number]  HPI Pt presents to clinic for evaluation of agitation. Accompanied by her daughter who assists in her hx. She believes there is no significant increase in agitation only waxing and waning due to underlying dementia. There have been no new acute complaints and pt seems at baseline. Thyroid dose was changed recently however.  Past Medical History  Diagnosis Date  . CAD (coronary artery disease)   . History of CVA (cerebrovascular accident)   . Atrial fibrillation     peristent  . Hyperlipemia   . COPD (chronic obstructive pulmonary disease)   . Epistaxis     recurrent  . Osteoporosis   . Hypothyroidism   . History of pulmonary embolism   . History of colonic diverticulitis   . History of breast cancer   . Asthma   . Iron deficiency anemia   . History of transfusion of whole blood   . LBBB (left bundle branch block)     history of  . Aortal stenosis     severe  . Pacemaker     bradycardia s/p pacemaker implant  . PAD (peripheral artery disease)     left leg occluded viabahn stents x 3 left SFA  . Tachycardia-bradycardia syndrome     s/p PPM  . Cancer   . Arthritis   . Stroke    Past Surgical History  Procedure Date  . Thyroidectomy     due to cancer  . Total hip arthroplasty   . Abdominal hysterectomy   . Mastectomy   . Mitral valve repair   . Intraocular lens insertion     left eye implant  . Pacemaker placement     for tachy/brady syndrome    reports that she has quit smoking. She has never used smokeless tobacco. She reports that she does not drink alcohol or use illicit drugs. family history includes Breast cancer in an unspecified family member; Colon cancer in an unspecified family member; Diabetes in an unspecified family member; Heart disease in her mother; Hyperlipidemia in an unspecified family member; and Hypertension in an unspecified family member. Allergies    Allergen Reactions  . Donepezil Hydrochloride     REACTION: heart fluttering  . Morphine Sulfate   . Oxycodone-Acetaminophen     REACTION: Hallucinations     Review of Systems see hpi     Objective:   Physical Exam  Nursing note and vitals reviewed. Constitutional: She appears well-developed and well-nourished. No distress.  HENT:  Head: Normocephalic and atraumatic.  Right Ear: External ear normal.  Left Ear: External ear normal.  Eyes: Conjunctivae are normal. No scleral icterus.  Neck: Neck supple.  Cardiovascular: Normal rate and regular rhythm.   Murmur heard. Pulmonary/Chest: Effort normal and breath sounds normal. No respiratory distress. She has no wheezes. She has no rales.  Neurological: She is alert.  Skin: Skin is warm and dry. She is not diaphoretic.  Psychiatric: She has a normal mood and affect.          Assessment & Plan:

## 2012-02-17 LAB — URINALYSIS, ROUTINE W REFLEX MICROSCOPIC
Leukocytes, UA: NEGATIVE
Nitrite: NEGATIVE
Specific Gravity, Urine: 1.018 (ref 1.005–1.030)
pH: 7 (ref 5.0–8.0)

## 2012-02-17 LAB — URINALYSIS, MICROSCOPIC ONLY
Casts: NONE SEEN
Crystals: NONE SEEN
Squamous Epithelial / LPF: NONE SEEN

## 2012-02-22 ENCOUNTER — Ambulatory Visit: Payer: Medicare Other | Admitting: Internal Medicine

## 2012-02-29 ENCOUNTER — Other Ambulatory Visit (HOSPITAL_BASED_OUTPATIENT_CLINIC_OR_DEPARTMENT_OTHER): Payer: Medicare Other | Admitting: Lab

## 2012-02-29 ENCOUNTER — Ambulatory Visit (HOSPITAL_BASED_OUTPATIENT_CLINIC_OR_DEPARTMENT_OTHER): Payer: Medicare Other | Admitting: Hematology & Oncology

## 2012-02-29 VITALS — BP 132/70 | HR 76 | Temp 97.0°F | Ht 59.0 in | Wt 96.0 lb

## 2012-02-29 DIAGNOSIS — Z853 Personal history of malignant neoplasm of breast: Secondary | ICD-10-CM

## 2012-02-29 DIAGNOSIS — D509 Iron deficiency anemia, unspecified: Secondary | ICD-10-CM

## 2012-02-29 DIAGNOSIS — F0390 Unspecified dementia without behavioral disturbance: Secondary | ICD-10-CM

## 2012-02-29 LAB — CBC WITH DIFFERENTIAL (CANCER CENTER ONLY)
BASO%: 0.9 % (ref 0.0–2.0)
EOS%: 3.5 % (ref 0.0–7.0)
HCT: 32.6 % — ABNORMAL LOW (ref 34.8–46.6)
LYMPH%: 17.9 % (ref 14.0–48.0)
MCHC: 32.8 g/dL (ref 32.0–36.0)
MCV: 98 fL (ref 81–101)
MONO#: 0.6 10*3/uL (ref 0.1–0.9)
NEUT%: 68.3 % (ref 39.6–80.0)
RDW: 14.6 % (ref 11.1–15.7)

## 2012-02-29 LAB — IRON AND TIBC: %SAT: 25 % (ref 20–55)

## 2012-02-29 NOTE — Progress Notes (Signed)
This office note has been dictated.

## 2012-03-01 NOTE — Progress Notes (Signed)
CC:   Rita Miller and Rita Miller, Rita Miller [phone] Sandford Craze, NP Madolyn Frieze. Jens Som, MD, [facility]  DIAGNOSES: 1. Recurrent iron-deficiency anemia. 2. Stage I infiltrating ductal carcinoma of the right breast. 3. Chronic left breast pain.  CURRENT THERAPY:  IV iron as indicated.  The patient last received on May 23rd 1020 mg of Feraheme.  INTERIM HISTORY:  Ms. Pincock comes in for followup.  She is about the same.  We did give her IV iron back in May.  Her ferritin was 412, which was mostly an acute phase reactant.  Her iron saturation was only 15% with an iron of 36.  She still has the chronic left breast pain.  She had a mammogram with ultrasound done last month.  There was no evidence of any malignancy. She had a couple of small nodules which were less than 1 cm, which had not grown in size.  She still has this dementia.  She just keeps talking about going back to [place].  She wants to go back to [place] to live.  This is where her family is.  Her daughter is down here.  She says that her daughter will not let her move.  She is not okay.  She has had no problems with nausea or vomiting. There have been no fevers.  There has been no bleeding.  PHYSICAL EXAMINATION:  This is a petite, elderly white female in no obvious distress.  Vital signs:  97, pulse 70, respiratory rate 18, blood pressure 132/70.  Weight is 96 pounds.  Head and neck: Normocephalic, atraumatic skull.  There may be some slight temporal muscle wasting.  There is no adenopathy in the neck.  Lungs:  Clear bilaterally.  Cardiac:  Regular rate and rhythm with no murmurs, rubs or bruits.  Abdomen:  Soft with good bowel sounds.  There is no palpable abdominal mass.  There is no fluid wave.  There is no palpable hepatosplenomegaly.  Breasts:  Left breast with no obvious masses. There is some discomfort to palpation of the left breast.  No erythema is noted in the left breast.  There is no left axillary  adenopathy. Right chest wall shows well-healed mastectomy with no chest wall nodules, erythema or tenderness.  There is no right axillary adenopathy. Back:  No tenderness over the spine, ribs, or hips.  Extremities:  No clubbing, cyanosis or edema.  LABORATORY STUDIES:  White cell count of 6.8, hemoglobin 10.7, hematocrit 32.6, platelet count 227.  IMPRESSION:  Ms. Gadomski is a 78 year old white female with iron- deficiency anemia.  Her anemia is a little bit better today.  We will just have to keep watch over this.  I still do not see any evidence of breast cancer.  We will continue to follow her along.  We will plan see her back in about 2-3 months.  I know she really wants go back to [place].  I doubt this will ever happen, but one never knows.    ______________________________ Josph Macho, M.D. PRE/MEDQ  D:  02/29/2012  T:  03/01/2012  Job:  [id number]

## 2012-03-07 ENCOUNTER — Telehealth: Payer: Self-pay | Admitting: *Deleted

## 2012-03-07 NOTE — Telephone Encounter (Signed)
Received call from [facility] to check the status of pt's coumadin dosage faxed to [place] on 03/01/12?  INR--2.7. Current Coumadin dose is: 2mg  on Mon, Wed, Sat and 3mg  on Tues, Thur, Fri and Sun.  Per Provider, ok to continue current dose and recheck PT / INR in 1 week. Verbal given to [facility] on 03/06/12 at 5:30pm and written order faxed to [phone] today.

## 2012-03-09 ENCOUNTER — Telehealth: Payer: Self-pay | Admitting: Family

## 2012-03-09 NOTE — Telephone Encounter (Signed)
Pls call [facility] and let them know INR is therapeutic and to continue current dose of coumadin and weekly coumadin checks.

## 2012-03-09 NOTE — Telephone Encounter (Signed)
Notified Darlene, nurse at [facility] for pt to continue current dose and recheck INR in 1 week.

## 2012-03-09 NOTE — Telephone Encounter (Signed)
Message copied by Sandford Craze on Fri Mar 09, 2012  1:01 PM ------      Message from: Eulah Pont      Created: Fri Mar 09, 2012 12:52 PM      Regarding: INR       [name] [facility] in pt's  INR   Results 2.3  Drawn  03/08/2012

## 2012-03-15 ENCOUNTER — Telehealth: Payer: Self-pay | Admitting: *Deleted

## 2012-03-15 NOTE — Telephone Encounter (Signed)
Caller reporting patient's INR: 2.2; Coumadin: 2 mg Mon, Wed, Sat & 3 mg Tues, Thurs, Fri, Sun Request physician order for any change in dosage schedule & recheck time period/SLS Please advise.

## 2012-03-15 NOTE — Telephone Encounter (Signed)
Same dose. Recheck 3wks 

## 2012-03-15 NOTE — Telephone Encounter (Signed)
Informed nurse [Darlene] at pt's living facility of MD instructions for patient; understood & agreed/SLS

## 2012-04-02 ENCOUNTER — Ambulatory Visit (INDEPENDENT_AMBULATORY_CARE_PROVIDER_SITE_OTHER): Payer: Medicare Other | Admitting: Internal Medicine

## 2012-04-02 ENCOUNTER — Encounter: Payer: Self-pay | Admitting: Internal Medicine

## 2012-04-02 VITALS — BP 96/52 | HR 75 | Temp 97.6°F | Resp 14 | Wt 98.0 lb

## 2012-04-02 DIAGNOSIS — R739 Hyperglycemia, unspecified: Secondary | ICD-10-CM | POA: Insufficient documentation

## 2012-04-02 DIAGNOSIS — I2510 Atherosclerotic heart disease of native coronary artery without angina pectoris: Secondary | ICD-10-CM

## 2012-04-02 DIAGNOSIS — R7309 Other abnormal glucose: Secondary | ICD-10-CM

## 2012-04-02 DIAGNOSIS — R748 Abnormal levels of other serum enzymes: Secondary | ICD-10-CM | POA: Insufficient documentation

## 2012-04-02 NOTE — Assessment & Plan Note (Signed)
Decrease diovan dose to 1/2 tablet qd and monitor bp

## 2012-04-02 NOTE — Patient Instructions (Signed)
Please obtain tsh/free t4(hypothyroidism) prior to next visit

## 2012-04-02 NOTE — Progress Notes (Signed)
  Subjective:    Patient ID: Rita Miller, female    DOB: Jul 18, 1930, 78 y.o.   MRN: [id number]  HPI Pt presents to clinic for followup of multiple medical problems. Weight stable. BP low nl. Reviewed hyperglycemia without h/o DM. No new complaints.   Past Medical History  Diagnosis Date  . CAD (coronary artery disease)   . History of CVA (cerebrovascular accident)   . Atrial fibrillation     peristent  . Hyperlipemia   . COPD (chronic obstructive pulmonary disease)   . Epistaxis     recurrent  . Osteoporosis   . Hypothyroidism   . History of pulmonary embolism   . History of colonic diverticulitis   . History of breast cancer   . Asthma   . Iron deficiency anemia   . History of transfusion of whole blood   . LBBB (left bundle branch block)     history of  . Aortal stenosis     severe  . Pacemaker     bradycardia s/p pacemaker implant  . PAD (peripheral artery disease)     left leg occluded viabahn stents x 3 left SFA  . Tachycardia-bradycardia syndrome     s/p PPM  . Cancer   . Arthritis   . Stroke    Past Surgical History  Procedure Date  . Thyroidectomy     due to cancer  . Total hip arthroplasty   . Abdominal hysterectomy   . Mastectomy   . Mitral valve repair   . Intraocular lens insertion     left eye implant  . Pacemaker placement     for tachy/brady syndrome    reports that she has quit smoking. She has never used smokeless tobacco. She reports that she does not drink alcohol or use illicit drugs. family history includes Breast cancer in an unspecified family member; Colon cancer in an unspecified family member; Diabetes in an unspecified family member; Heart disease in her mother; Hyperlipidemia in an unspecified family member; and Hypertension in an unspecified family member. Allergies  Allergen Reactions  . Donepezil Hydrochloride     REACTION: heart fluttering  . Morphine Sulfate   . Oxycodone-Acetaminophen     REACTION: Hallucinations       Review of Systems see hpi     Objective:   Physical Exam  Nursing note and vitals reviewed. Constitutional: She appears well-developed and well-nourished. No distress.  HENT:  Head: Normocephalic and atraumatic.  Right Ear: External ear normal.  Left Ear: External ear normal.  Eyes: Conjunctivae are normal. No scleral icterus.  Cardiovascular: Normal rate and regular rhythm.  Exam reveals no gallop and no friction rub.   Murmur heard. Neurological: She is alert.  Skin: She is not diaphoretic.  Psychiatric: She has a normal mood and affect.          Assessment & Plan:

## 2012-04-02 NOTE — Assessment & Plan Note (Signed)
Obtain lft 

## 2012-04-02 NOTE — Assessment & Plan Note (Signed)
Obtain a1c.  

## 2012-04-03 LAB — HEPATIC FUNCTION PANEL
Albumin: 3.5 g/dL (ref 3.5–5.2)
Alkaline Phosphatase: 71 U/L (ref 39–117)
Total Bilirubin: 0.5 mg/dL (ref 0.3–1.2)

## 2012-04-03 LAB — HEMOGLOBIN A1C
Hgb A1c MFr Bld: 5.9 % — ABNORMAL HIGH (ref <5.7)
Mean Plasma Glucose: 123 mg/dL — ABNORMAL HIGH (ref <117)

## 2012-04-05 ENCOUNTER — Telehealth: Payer: Self-pay | Admitting: *Deleted

## 2012-04-05 NOTE — Telephone Encounter (Signed)
Take 4mg  this Friday instead of 2mg . Then resume normal dosing. Recheck inr one week

## 2012-04-05 NOTE — Telephone Encounter (Signed)
Received message from [facility] to report pt's recent INR of 1.7. States pts Coumadin dose is currently:  2mg  on M, W, Sat and 3mg  on T, Th, Fri and Sun.  Please advise or fax order to [phone].

## 2012-04-06 NOTE — Telephone Encounter (Signed)
Caller informed; understood & agreed/SLS

## 2012-04-13 ENCOUNTER — Telehealth: Payer: Self-pay | Admitting: *Deleted

## 2012-04-13 NOTE — Telephone Encounter (Signed)
Same dose.  Recheck 1 week.

## 2012-04-13 NOTE — Telephone Encounter (Signed)
Caller reports patient's PT/INR: 2.4 and would like to get new order, if needed/SLS Please advise.

## 2012-04-13 NOTE — Telephone Encounter (Signed)
Pt's nurse[Brittney] at living facility informed, understood & agreed/SLS

## 2012-04-17 ENCOUNTER — Telehealth: Payer: Self-pay | Admitting: *Deleted

## 2012-04-17 NOTE — Telephone Encounter (Signed)
Message from [facility]:  Thyroid dose was supposed to have been lowered to qd in July with a recheck of tsh/free t4 8-10 weeks later. MAR shows still   Spoke with pt's nurse with this information; checked their records & had no documentation for this change in medication and/or to recheck thyroid labs in 8-10 wks. Facility will start change in Levothyroxine, decrease to 75 mcg QD, tomorrow morning 09.11.13 & will have the requested labs drawn on Thursday, 09.12.13, the next day labs are available at facility/SLS

## 2012-04-19 MED ORDER — LEVOTHYROXINE SODIUM 75 MCG PO TABS: 75.0000 ug | ORAL_TABLET | Freq: Every day | ORAL | Status: DC

## 2012-04-19 NOTE — Addendum Note (Signed)
Addended by: Regis Bill on: 04/19/2012 05:35 PM   Modules accepted: Orders

## 2012-04-19 NOTE — Telephone Encounter (Signed)
Spoke with [name] RE: pt's thyroid results & PT/INR results [Debbie LM earlier] TSH 0.05, T4 Free 1.2 -- Patient has yet to be decreased to Synthroid 75 mcg daily as previously instructed, [name] reports pt's chart still reflects 88 mcg daily--Reiterated that this was our 3rd request to have patient's dosage lowered, caller made notation in pt's chart while on phone; will repeat labs 8-10 wks. PT/INR 2.4 -- No change in Coumadin, repeat check in 3 weeks, caller stated notation in pt's chart has been made/SLS

## 2012-04-27 ENCOUNTER — Telehealth: Payer: Self-pay | Admitting: Internal Medicine

## 2012-04-27 NOTE — Telephone Encounter (Signed)
2/1 pt inr

## 2012-04-29 NOTE — Telephone Encounter (Signed)
Same dose. Recheck 2wk

## 2012-04-30 NOTE — Telephone Encounter (Signed)
Spoke w/[facility], understood & agreed/SLS

## 2012-05-07 ENCOUNTER — Other Ambulatory Visit (HOSPITAL_BASED_OUTPATIENT_CLINIC_OR_DEPARTMENT_OTHER): Payer: Medicare Other | Admitting: Lab

## 2012-05-07 ENCOUNTER — Ambulatory Visit (HOSPITAL_BASED_OUTPATIENT_CLINIC_OR_DEPARTMENT_OTHER): Payer: Medicare Other | Admitting: Hematology & Oncology

## 2012-05-07 VITALS — BP 121/42 | HR 86 | Temp 97.7°F | Resp 16 | Ht <= 58 in | Wt 95.0 lb

## 2012-05-07 DIAGNOSIS — G8929 Other chronic pain: Secondary | ICD-10-CM

## 2012-05-07 DIAGNOSIS — D509 Iron deficiency anemia, unspecified: Secondary | ICD-10-CM

## 2012-05-07 DIAGNOSIS — N644 Mastodynia: Secondary | ICD-10-CM

## 2012-05-07 DIAGNOSIS — Z853 Personal history of malignant neoplasm of breast: Secondary | ICD-10-CM

## 2012-05-07 LAB — CBC WITH DIFFERENTIAL (CANCER CENTER ONLY)
BASO#: 0.1 10*3/uL (ref 0.0–0.2)
EOS%: 3.2 % (ref 0.0–7.0)
Eosinophils Absolute: 0.2 10*3/uL (ref 0.0–0.5)
HCT: 34.8 % (ref 34.8–46.6)
HGB: 11.3 g/dL — ABNORMAL LOW (ref 11.6–15.9)
LYMPH#: 1.2 10*3/uL (ref 0.9–3.3)
MCHC: 32.5 g/dL (ref 32.0–36.0)
MONO#: 0.6 10*3/uL (ref 0.1–0.9)
NEUT%: 68.6 % (ref 39.6–80.0)
RBC: 3.54 10*6/uL — ABNORMAL LOW (ref 3.70–5.32)

## 2012-05-07 LAB — IRON AND TIBC
%SAT: 22 % (ref 20–55)
TIBC: 219 ug/dL — ABNORMAL LOW (ref 250–470)
UIBC: 170 ug/dL (ref 125–400)

## 2012-05-07 LAB — COMPREHENSIVE METABOLIC PANEL
ALT: 14 U/L (ref 0–35)
AST: 20 U/L (ref 0–37)
Albumin: 3.6 g/dL (ref 3.5–5.2)
Alkaline Phosphatase: 73 U/L (ref 39–117)
Potassium: 4.3 mEq/L (ref 3.5–5.3)
Sodium: 138 mEq/L (ref 135–145)
Total Bilirubin: 0.6 mg/dL (ref 0.3–1.2)
Total Protein: 7.1 g/dL (ref 6.0–8.3)

## 2012-05-07 LAB — FERRITIN: Ferritin: 592 ng/mL — ABNORMAL HIGH (ref 10–291)

## 2012-05-07 NOTE — Progress Notes (Signed)
This office note has been dictated.

## 2012-05-08 NOTE — Progress Notes (Signed)
CC:   [facility], [place] [phone] Rita Miller. Rita Som, MD, [facility]  DIAGNOSES: 1. Recurrent iron deficiency anemia. 2. History of stage I ductal carcinoma of the right breast. 3. Chronic left breast pain.  CURRENT THERAPY:  IV iron as indicated.  INTERVAL HISTORY:  Rita Miller comes in for followup.  She had iron back in May. She has been doing well with this.  She still has the breast pain in the left breast.  So far, nothing has ever shown up on evaluation.  She has had ultrasounds done. She has had mammograms done.  The last 1 was done back in June.  She is doing well otherwise.  She is at the nursing center.  She still has the dementia.  She still wants go back up to [place].  She has not had any problems with bleeding. There is no fever.  There has been no leg swelling.  There have been no rashes.  When we last saw her in July, her ferritin was 738 with iron saturation of 25%.  PHYSICAL EXAMINATION:  This is a petite white female in no obvious distress.  Vital signs:  97.7, pulse 86, respiratory rate 16, blood pressure 121/42.  Weight is 95 pounds.  Head and neck:  Normocephalic, atraumatic skull.  There are no ocular or oral lesions.  There are no palpable cervical or supraclavicular lymph nodes.  Lungs:  Clear bilaterally.  Cardiac:  Regular rate and rhythm with a normal S1, S2. There are no murmurs, rubs or bruits.  Breasts:  Right mastectomy.  No right chest wall nodules are noted.  Left breast still has some tenderness.  The tenderness is mostly about the chest wall however.  No distinct mass is noted in the left breast itself.  There is no nipple discharge.  There is no left axillary adenopathy.  Abdomen:  Soft with good bowel sounds.  There is no palpable abdominal mass.  No palpable hepatosplenomegaly is noted.  Extremities:  No clubbing, cyanosis or edema.  LABORATORY STUDIES:  White cell count 6.5, hemoglobin 11.48m hematocrit 34.8, platelet count  247.  IMPRESSION:  Rita Miller is an 78 year old white female with recurrent iron deficiency anemia.  Her blood count continues to improve.  She had iron back in May.  I still do not think that there is anything with respect to the left breast causing the discomfort.  Again, she has been evaluated.  We will go ahead and plan to get her back after the holidays now.  I think we can wait 76 months before we need to see her again.    ______________________________ Josph Macho, M.D. PRE/MEDQ  D:  05/07/2012  T:  05/08/2012  Job:  [id number]

## 2012-05-10 ENCOUNTER — Telehealth: Payer: Self-pay | Admitting: *Deleted

## 2012-05-10 ENCOUNTER — Encounter: Payer: Self-pay | Admitting: *Deleted

## 2012-05-10 NOTE — Telephone Encounter (Signed)
Received message from Daniel re: INR result dated 05/10/12 is 2.9.  Pt is currently taking Coumadin 2mg  on: mon, wed, sat. 3mg  on tues, thur, fri & sun.  Please advise.

## 2012-05-10 NOTE — Telephone Encounter (Signed)
Change Friday from 3mg  to 2mg . Other days same. Repeat 2 weeks

## 2012-05-11 NOTE — Telephone Encounter (Signed)
[name] at nursing facility informed, understood & agreed/SLS

## 2012-05-17 ENCOUNTER — Ambulatory Visit (INDEPENDENT_AMBULATORY_CARE_PROVIDER_SITE_OTHER): Payer: Medicare Other | Admitting: *Deleted

## 2012-05-17 DIAGNOSIS — I495 Sick sinus syndrome: Secondary | ICD-10-CM

## 2012-05-17 DIAGNOSIS — I4891 Unspecified atrial fibrillation: Secondary | ICD-10-CM

## 2012-05-17 LAB — PACEMAKER DEVICE OBSERVATION
AL AMPLITUDE: 1 mv
AL IMPEDENCE PM: 816 Ohm
BAMS-0001: 175 {beats}/min
BATTERY VOLTAGE: 2.75 V
BRDY-0004RV: 130 {beats}/min
RV LEAD THRESHOLD: 1.125 V

## 2012-05-17 NOTE — Progress Notes (Signed)
PPM check 

## 2012-05-24 ENCOUNTER — Telehealth: Payer: Self-pay | Admitting: *Deleted

## 2012-05-24 NOTE — Telephone Encounter (Signed)
Received call from [facility] at [facility]. Pt's INR from 05/24/12 6:30am is 2.0. Current dose of Coumadin is 2mg  mon, wed, sat and 3mg  tues, thurs, fri and Sunday.  Please advise.

## 2012-05-25 NOTE — Telephone Encounter (Signed)
Take extra 2mg  today. Otherwise keep same dose. Recheck ~10days

## 2012-05-25 NOTE — Telephone Encounter (Signed)
Notified [facility].

## 2012-06-04 ENCOUNTER — Encounter: Payer: Self-pay | Admitting: Internal Medicine

## 2012-06-04 ENCOUNTER — Telehealth: Payer: Self-pay | Admitting: *Deleted

## 2012-06-04 NOTE — Telephone Encounter (Signed)
Spoke with [name]; [patient's nurse]; understood & agreed to provider's instructions/SLS

## 2012-06-04 NOTE — Telephone Encounter (Signed)
Same dose. Recheck 3wks 

## 2012-06-04 NOTE — Telephone Encounter (Signed)
Lisa from [facility] called to report pt's INR : 2.3; current dosage 2 mg. Mon, Wed, Sat & 3 mg Tues, Thurs, Fri, Sun/SLS Please advise.

## 2012-06-05 ENCOUNTER — Telehealth: Payer: Self-pay | Admitting: *Deleted

## 2012-06-05 NOTE — Telephone Encounter (Signed)
Don't think so

## 2012-06-05 NOTE — Telephone Encounter (Signed)
Caller reports that pt has upcoming OV on 11.26.13 @ 1:30pm and would like to know if there are any labs that needed to be done prior [fax to: [phone]]/SLS Please advise.

## 2012-06-06 NOTE — Telephone Encounter (Signed)
Spoke with [facility], informed of provider's response, understood/SLS

## 2012-06-07 ENCOUNTER — Encounter: Payer: Self-pay | Admitting: Vascular Surgery

## 2012-06-08 ENCOUNTER — Ambulatory Visit: Payer: Medicare Other | Admitting: Neurosurgery

## 2012-06-08 ENCOUNTER — Ambulatory Visit: Payer: Medicare Other | Admitting: Vascular Surgery

## 2012-06-14 ENCOUNTER — Encounter: Payer: Self-pay | Admitting: Neurosurgery

## 2012-06-15 ENCOUNTER — Encounter (INDEPENDENT_AMBULATORY_CARE_PROVIDER_SITE_OTHER): Payer: Medicare Other | Admitting: *Deleted

## 2012-06-15 ENCOUNTER — Ambulatory Visit (INDEPENDENT_AMBULATORY_CARE_PROVIDER_SITE_OTHER): Payer: Medicare Other | Admitting: Neurosurgery

## 2012-06-15 ENCOUNTER — Encounter: Payer: Self-pay | Admitting: Neurosurgery

## 2012-06-15 VITALS — BP 111/56 | HR 81 | Resp 14 | Ht 59.0 in | Wt 96.4 lb

## 2012-06-15 DIAGNOSIS — I999 Unspecified disorder of circulatory system: Secondary | ICD-10-CM

## 2012-06-15 DIAGNOSIS — I998 Other disorder of circulatory system: Secondary | ICD-10-CM

## 2012-06-15 DIAGNOSIS — I739 Peripheral vascular disease, unspecified: Secondary | ICD-10-CM

## 2012-06-15 DIAGNOSIS — Z48812 Encounter for surgical aftercare following surgery on the circulatory system: Secondary | ICD-10-CM

## 2012-06-15 NOTE — Progress Notes (Signed)
[facility] Office Note  CC: PVD surveillance Referring Physician: Imogene Burn  History of Present Illness: 78 year old female patient of Dr. Imogene Burn with a history of a left CIA, SFA and popliteal artery stent not done by this practice. The patient has dementia and it is questionable how reliable her information is. She is seen today with an attendant from her facility. The patient denies claudication or rest pain however she does state she has left knee pain that is transient. The patient states she is moving back to [place].  Past Medical History  Diagnosis Date  . CAD (coronary artery disease)   . History of CVA (cerebrovascular accident)   . Atrial fibrillation     peristent  . Hyperlipemia   . COPD (chronic obstructive pulmonary disease)   . Epistaxis     recurrent  . Osteoporosis   . Hypothyroidism   . History of pulmonary embolism   . History of colonic diverticulitis   . History of breast cancer   . Asthma   . Iron deficiency anemia   . History of transfusion of whole blood   . LBBB (left bundle branch block)     history of  . Aortal stenosis     severe  . Pacemaker     bradycardia s/p pacemaker implant  . PAD (peripheral artery disease)     left leg occluded viabahn stents x 3 left SFA  . Tachycardia-bradycardia syndrome     s/p PPM  . Cancer   . Arthritis   . Stroke     ROS: [x]  Positive   [ ]  Denies    General: [ ]  Weight loss, [ ]  Fever, [ ]  chills Neurologic: [ ]  Dizziness, [ ]  Blackouts, [ ]  Seizure [ ]  Stroke, [ ]  "Mini stroke", [ ]  Slurred speech, [ ]  Temporary blindness; [ ]  weakness in arms or legs, [ ]  Hoarseness Cardiac: [ ]  Chest pain/pressure, [ ]  Shortness of breath at rest [ ]  Shortness of breath with exertion, [ ]  Atrial fibrillation or irregular heartbeat Vascular: [ ]  Pain in legs with walking, [ ]  Pain in legs at rest, [ ]  Pain in legs at night,  [ ]  Non-healing ulcer, [ ]  Blood clot in vein/DVT,   Pulmonary:  [ ]  Home oxygen, [ ]  Productive cough, [ ]  Coughing up blood, [ ]  Asthma,  [ ]  Wheezing Musculoskeletal:  [ ]  Arthritis, [ ]  Low back pain, [ ]  Joint pain Hematologic: [ ]  Easy Bruising, [ ]  Anemia; [ ]  Hepatitis Gastrointestinal: [ ]  Blood in stool, [ ]  Gastroesophageal Reflux/heartburn, [ ]  Trouble swallowing Urinary: [ ]  chronic Kidney disease, [ ]  on HD - [ ]  MWF or [ ]  TTHS, [ ]  Burning with urination, [ ]  Difficulty urinating Skin: [ ]  Rashes, [ ]  Wounds Psychological: [ ]  Anxiety, [ ]  Depression   Social History History  Substance Use Topics  . Smoking status: Former Games developer  . Smokeless tobacco: Never Used  . Alcohol Use: No    Family History Family History  Problem Relation Age of Onset  . Breast cancer    . Colon cancer    . Diabetes    . Hyperlipidemia    . Hypertension    . Heart disease Mother     unknown heart disease    Allergies  Allergen Reactions  . Donepezil Hydrochloride     REACTION: heart fluttering  . Morphine Sulfate   . Oxycodone-Acetaminophen  REACTION: Hallucinations    Current Outpatient Prescriptions  Medication Sig Dispense Refill  . diltiazem (CARDIZEM CD) 240 MG 24 hr capsule Take 240 mg by mouth daily.       . fluticasone (FLOVENT HFA) 44 MCG/ACT inhaler Inhale 1 puff into the lungs 2 (two) times daily.        Marland Kitchen levothyroxine (SYNTHROID, LEVOTHROID) 75 MCG tablet Take 1 tablet (75 mcg total) by mouth daily.      . Melatonin 3 MG TABS Take 3 mg by mouth at bedtime as needed.       . memantine (NAMENDA) 10 MG tablet Take 10 mg by mouth daily.      Marland Kitchen omeprazole (PRILOSEC) 20 MG capsule 20 mg. Take one capsule daily. Take on and empty stomach- Do no crush.      . polyethylene glycol (MIRALAX / GLYCOLAX) packet Take 17 g by mouth daily. Mix 17gm in 6-8 ounces of liquid daily.       . Pseudoephedrine-DM-GG (ROBITUSSIN CF PO) Take 10 mLs by mouth every 8 (eight) hours as needed. Cough        . rivastigmine (EXELON) 9.5 mg/24hr Place 1  patch onto the skin daily.      . rosuvastatin (CRESTOR) 20 MG tablet Take 20 mg by mouth at bedtime.        Marland Kitchen SALINE NASAL SPRAY NA Place 0.65 % into the nose. One spray in each nostril twice a day for nose bleeds       . tiotropium (SPIRIVA) 18 MCG inhalation capsule Place 18 mcg into inhaler and inhale daily.        . valsartan (DIOVAN) 40 MG tablet Take 40 mg by mouth daily. Take one half tablet by mouth daily      . vitamin E 400 UNIT capsule Take 400 Units by mouth daily.        Marland Kitchen warfarin (COUMADIN) 2 MG tablet Take 2 mg by mouth as directed. As of 02/02/2012-Take 3 mg Tue Thu Fri and Sun and 2 mg all other days.        Physical Examination  Filed Vitals:   06/15/12 1138  BP: 111/56  Pulse: 81  Resp: 14    Body mass index is 19.47 kg/(m^2).  General:  WDWN in NAD Gait: Normal HEENT: WNL Eyes: Pupils equal Pulmonary: normal non-labored breathing , without Rales, rhonchi,  wheezing Cardiac: RRR, without  Murmurs, rubs or gallops; No carotid bruits Abdomen: soft, NT, no masses Skin: no rashes, ulcers noted Vascular Exam/Pulses: Left lower extremity pulses are not palpable, right PT and DP are palpable  Extremities without ischemic changes, no Gangrene , no cellulitis; no open wounds;  Musculoskeletal: no muscle wasting or atrophy  Neurologic: A&O X 3; Appropriate Affect ; SENSATION: normal; MOTOR FUNCTION:  moving all extremities equally. Speech is fluent/normal  Non-Invasive Vascular Imaging: ABIs today are 0.91 and biphasic on the right, 0.49 monophasic on the left which is unchanged from previous exam  ASSESSMENT/PLAN: This is a patient that is unreliable due to dementia however Dr. Nicky Pugh previous note does not indicate that revascularization is a possibility. The patient will followup in 6 months with repeat ABIs if she is still residing in this area. The patient's questions were answered to the best of my ability.  Lauree Chandler ANP  Clinic M.D.: Edilia Bo on call

## 2012-06-18 NOTE — Addendum Note (Signed)
Addended by: Sharee Pimple on: 06/18/2012 08:42 AM   Modules accepted: Orders

## 2012-06-25 ENCOUNTER — Telehealth: Payer: Self-pay | Admitting: *Deleted

## 2012-06-25 NOTE — Telephone Encounter (Signed)
Spoke with [name] w/[facility], understood & agreed/SLS

## 2012-06-25 NOTE — Telephone Encounter (Signed)
Same dose 3 weeks

## 2012-06-25 NOTE — Telephone Encounter (Signed)
Caller reports Pt's PT/INR: 2.1 [current dosage: 2 mg-Mon, Wed, Sat; 3 mg Tues, Thurs, Fri, Sun]/SLS Please advise.

## 2012-06-25 NOTE — Telephone Encounter (Signed)
Rita Miller reports on Rita Miller's: TSH 0.19; T4 free 1.2--currently taking Levothyroxine 75 mcg daily/SLS Please advise.

## 2012-06-25 NOTE — Telephone Encounter (Signed)
Spoke with [name]; gave provider instructions, understood & agreed/SLS

## 2012-06-25 NOTE — Telephone Encounter (Signed)
Nl. Continue same dose

## 2012-07-03 ENCOUNTER — Encounter: Payer: Self-pay | Admitting: Internal Medicine

## 2012-07-03 ENCOUNTER — Ambulatory Visit (INDEPENDENT_AMBULATORY_CARE_PROVIDER_SITE_OTHER): Payer: Medicare Other | Admitting: Internal Medicine

## 2012-07-03 VITALS — BP 132/59 | HR 75 | Temp 97.8°F | Resp 14 | Wt 100.5 lb

## 2012-07-03 DIAGNOSIS — R7309 Other abnormal glucose: Secondary | ICD-10-CM

## 2012-07-03 DIAGNOSIS — E039 Hypothyroidism, unspecified: Secondary | ICD-10-CM

## 2012-07-03 DIAGNOSIS — E781 Pure hyperglyceridemia: Secondary | ICD-10-CM

## 2012-07-03 DIAGNOSIS — E785 Hyperlipidemia, unspecified: Secondary | ICD-10-CM

## 2012-07-03 DIAGNOSIS — R739 Hyperglycemia, unspecified: Secondary | ICD-10-CM

## 2012-07-03 DIAGNOSIS — R040 Epistaxis: Secondary | ICD-10-CM

## 2012-07-03 LAB — GLUCOSE, POCT (MANUAL RESULT ENTRY): POC Glucose: 79 mg/dl (ref 70–99)

## 2012-07-03 NOTE — Patient Instructions (Signed)
Chem7, a1c-hyperglycemia, lipid/lft-272.4 and tsh/free t4-hypothyroidism with next visit

## 2012-07-03 NOTE — Progress Notes (Signed)
  Subjective:    Patient ID: Rita Miller, female    DOB: 12-10-1929, 78 y.o.   MRN: [id number]  HPI Pt presents to clinic for followup of multiple medical problems. Presents with facility aide. Continues to be followed by oncology for history of breast cancer and anemia. Does complain of recurrent epistaxis occurring approximately 2 days a week. Is maintained on chronic Coumadin anticoagulation. His bleeders were controlled by pressure. Patient recalls having required cauterization in the past for this. Weight up 2 pounds and blood pressure under average control after decrease of Diovan. Facility aide states received influenza vaccine this season  Past Medical History  Diagnosis Date  . CAD (coronary artery disease)   . History of CVA (cerebrovascular accident)   . Atrial fibrillation     peristent  . Hyperlipemia   . COPD (chronic obstructive pulmonary disease)   . Epistaxis     recurrent  . Osteoporosis   . Hypothyroidism   . History of pulmonary embolism   . History of colonic diverticulitis   . History of breast cancer   . Asthma   . Iron deficiency anemia   . History of transfusion of whole blood   . LBBB (left bundle branch block)     history of  . Aortal stenosis     severe  . Pacemaker     bradycardia s/p pacemaker implant  . PAD (peripheral artery disease)     left leg occluded viabahn stents x 3 left SFA  . Tachycardia-bradycardia syndrome     s/p PPM  . Cancer   . Arthritis   . Stroke    Past Surgical History  Procedure Date  . Thyroidectomy     due to cancer  . Total hip arthroplasty   . Abdominal hysterectomy   . Mastectomy   . Mitral valve repair   . Intraocular lens insertion     left eye implant  . Pacemaker placement     for tachy/brady syndrome    reports that she has quit smoking. She has never used smokeless tobacco. She reports that she does not drink alcohol or use illicit drugs. family history includes Breast cancer in an unspecified family  member; Colon cancer in an unspecified family member; Diabetes in an unspecified family member; Heart disease in her mother; Hyperlipidemia in an unspecified family member; and Hypertension in an unspecified family member. Allergies  Allergen Reactions  . Donepezil Hydrochloride     REACTION: heart fluttering  . Morphine Sulfate   . Oxycodone-Acetaminophen     REACTION: Hallucinations      Review of Systems see history of present illness     Objective:   Physical Exam  Nursing note and vitals reviewed. Constitutional: She appears well-developed and well-nourished. No distress.  HENT:  Head: Normocephalic and atraumatic.  Eyes: Conjunctivae normal are normal.  Neck: Neck supple. No JVD present.  Cardiovascular: Normal rate and regular rhythm.  Exam reveals no gallop and no friction rub.   Murmur heard.  Systolic murmur is present with a grade of 3/6  Pulmonary/Chest: Effort normal and breath sounds normal. No respiratory distress. She has no wheezes. She has no rales.  Neurological: She is alert.  Skin: She is not diaphoretic.  Psychiatric: She has a normal mood and affect.          Assessment & Plan:

## 2012-07-03 NOTE — Assessment & Plan Note (Signed)
Status post recent dose adjustment. Recheck TSH and free T4 with next visit

## 2012-07-03 NOTE — Assessment & Plan Note (Signed)
Check fingerstick blood sugar today. Nonfasting. Obtain Chem-7 and A1c next visit. History of slightly elevated A1c of 5.9.

## 2012-07-03 NOTE — Assessment & Plan Note (Signed)
ENT consult

## 2012-07-03 NOTE — Assessment & Plan Note (Signed)
Stable. Obtain liver function test and fasting profile with the next visit

## 2012-07-16 ENCOUNTER — Telehealth: Payer: Self-pay | Admitting: *Deleted

## 2012-07-16 NOTE — Telephone Encounter (Signed)
Received call from Pecos at [facility] re: INR today of 2.0. Pt's current Coumadin dose is 2mg  on Monday, Wednesday and Saturday. 3mg  on Tuesday, Thursday, Friday and Sunday. Per verbal from Provider, order given to [name] to give pt an extra 2mg  dose tomorrow and resume previous dosing schedule and recheck INR in 3 weeks.

## 2012-08-06 ENCOUNTER — Telehealth: Payer: Self-pay | Admitting: *Deleted

## 2012-08-06 NOTE — Telephone Encounter (Signed)
Caller reporting pt's PT/INR: 1.8 [current dosage: 2 mg [Mon, Wed, Sat]; 3 mg [Tues, Thurs, Fri, Sun]/SLS Please advise.

## 2012-08-06 NOTE — Telephone Encounter (Signed)
Change Monday dose from 2mg  to 3mg . Other doses remain same. Recheck 7-10 days

## 2012-08-06 NOTE — Telephone Encounter (Signed)
Spoke w/[name] at [facility], understood & agreed; will recheck in 7 days/SLS

## 2012-08-14 ENCOUNTER — Telehealth: Payer: Self-pay | Admitting: *Deleted

## 2012-08-14 NOTE — Telephone Encounter (Signed)
Same dose. Recheck 2wks

## 2012-08-14 NOTE — Telephone Encounter (Signed)
Caller reports patient's INR/PT: 2.0; pt current dosage: 2 mg Wed & Sat; 3 mg Mon, Tues, Thurs, Fri, Sun/SLS Please advise.

## 2012-08-14 NOTE — Telephone Encounter (Signed)
Spoke with Archie Patten, understood & agreed/SLS

## 2012-08-28 ENCOUNTER — Telehealth: Payer: Self-pay | Admitting: *Deleted

## 2012-08-28 NOTE — Telephone Encounter (Signed)
INR now therapeutic, continue same dosing regimen and recheck in 14 days.

## 2012-08-28 NOTE — Telephone Encounter (Signed)
Caller reporting pt's PT/INR: 2.1 Last Report: Estill Cotta, MD 08/06/2012 1:32 PM Signed  Change Monday dose from 2mg  to 3mg . Other doses remain same. Recheck 7-10 days Coye Dawood, CMA 08/06/2012 1:25 PM Signed  Caller reporting pt's PT/INR: 1.8 [current dosage: 2 mg [Mon, Wed, Sat]; 3 mg [Tues, Thurs, Fri, Sun]/SLS  Please advise.

## 2012-08-28 NOTE — Telephone Encounter (Signed)
Spoke with [employer] and gave provider instructions, understood & agreed/SLS

## 2012-09-03 ENCOUNTER — Other Ambulatory Visit: Payer: Self-pay | Admitting: *Deleted

## 2012-09-03 ENCOUNTER — Ambulatory Visit (HOSPITAL_BASED_OUTPATIENT_CLINIC_OR_DEPARTMENT_OTHER): Payer: Medicare Other | Admitting: Hematology & Oncology

## 2012-09-03 ENCOUNTER — Other Ambulatory Visit (HOSPITAL_BASED_OUTPATIENT_CLINIC_OR_DEPARTMENT_OTHER): Payer: Medicare Other | Admitting: Lab

## 2012-09-03 ENCOUNTER — Encounter: Payer: Self-pay | Admitting: Family Medicine

## 2012-09-03 ENCOUNTER — Ambulatory Visit (INDEPENDENT_AMBULATORY_CARE_PROVIDER_SITE_OTHER): Payer: Medicare Other | Admitting: Family Medicine

## 2012-09-03 ENCOUNTER — Telehealth: Payer: Self-pay | Admitting: Hematology & Oncology

## 2012-09-03 VITALS — BP 123/53 | HR 80 | Temp 97.5°F | Resp 16 | Ht 59.0 in | Wt 100.0 lb

## 2012-09-03 VITALS — BP 120/50 | HR 92 | Temp 97.7°F | Ht 59.0 in | Wt 100.0 lb

## 2012-09-03 DIAGNOSIS — D509 Iron deficiency anemia, unspecified: Secondary | ICD-10-CM

## 2012-09-03 DIAGNOSIS — F0390 Unspecified dementia without behavioral disturbance: Secondary | ICD-10-CM

## 2012-09-03 DIAGNOSIS — I2510 Atherosclerotic heart disease of native coronary artery without angina pectoris: Secondary | ICD-10-CM

## 2012-09-03 DIAGNOSIS — E039 Hypothyroidism, unspecified: Secondary | ICD-10-CM

## 2012-09-03 DIAGNOSIS — N644 Mastodynia: Secondary | ICD-10-CM

## 2012-09-03 DIAGNOSIS — I4891 Unspecified atrial fibrillation: Secondary | ICD-10-CM

## 2012-09-03 DIAGNOSIS — Z853 Personal history of malignant neoplasm of breast: Secondary | ICD-10-CM

## 2012-09-03 DIAGNOSIS — G4700 Insomnia, unspecified: Secondary | ICD-10-CM

## 2012-09-03 NOTE — Progress Notes (Signed)
Patient ID: Rita Miller, female   DOB: Mar 11, 1930, 79 y.o.   MRN: [id number] Rita Miller [id number] 1930-02-05 09/03/2012      Progress Note-Follow Up  Subjective  Chief Complaint  Chief Complaint  Patient presents with  . Follow-up    2 month    HPI  Patient is an 79 year old Caucasian female who is brought in today by her living facility for followup visit. She's just been seen by hematology and is now here for her visit. Denies any acute complaints. She's actually feeling well. Denies any recent illness, fevers, chills, chest pain, palpitations or shortness of breath. Does have trouble sleeping but notes this is a lifelong problem.  Past Medical History  Diagnosis Date  . CAD (coronary artery disease)   . History of CVA (cerebrovascular accident)   . Atrial fibrillation     peristent  . Hyperlipemia   . COPD (chronic obstructive pulmonary disease)   . Epistaxis     recurrent  . Osteoporosis   . Hypothyroidism   . History of pulmonary embolism   . History of colonic diverticulitis   . History of breast cancer   . Asthma   . Iron deficiency anemia   . History of transfusion of whole blood   . LBBB (left bundle branch block)     history of  . Aortal stenosis     severe  . Pacemaker     bradycardia s/p pacemaker implant  . PAD (peripheral artery disease)     left leg occluded viabahn stents x 3 left SFA  . Tachycardia-bradycardia syndrome     s/p PPM  . Cancer   . Arthritis   . Stroke     Past Surgical History  Procedure Date  . Thyroidectomy     due to cancer  . Total hip arthroplasty   . Abdominal hysterectomy   . Mastectomy   . Mitral valve repair   . Intraocular lens insertion     left eye implant  . Pacemaker placement     for tachy/brady syndrome    Family History  Problem Relation Age of Onset  . Breast cancer    . Colon cancer    . Diabetes    . Hyperlipidemia    . Hypertension    . Heart disease Mother     unknown heart disease     History   Social History  . Marital Status: Widowed    Spouse Name: N/A    Number of Children: N/A  . Years of Education: N/A   Occupational History  . Not on file.   Social History Main Topics  . Smoking status: Former Games developer  . Smokeless tobacco: Never Used  . Alcohol Use: No  . Drug Use: No  . Sexually Active: Not on file   Other Topics Concern  . 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 diltiazem (CARDIZEM CD) 240 MG 24 hr capsule Take 240 mg by mouth daily.       . fluticasone (FLOVENT HFA) 44 MCG/ACT inhaler Inhale 1 puff into the lungs 2 (two) times daily.        Marland Kitchen levothyroxine (SYNTHROID, LEVOTHROID) 75 MCG tablet Take 1 tablet (75 mcg total) by mouth daily.      . Melatonin 3 MG TABS Take 3 mg by mouth at bedtime  as needed.       . memantine (NAMENDA) 10 MG tablet Take 10 mg by mouth daily.      Marland Kitchen omeprazole (PRILOSEC) 20 MG capsule 20 mg. Take one capsule daily. Take on and empty stomach- Do no crush.      . polyethylene glycol (MIRALAX / GLYCOLAX) packet Take 17 g by mouth daily. Mix 17gm in 6-8 ounces of liquid daily.       . rivastigmine (EXELON) 9.5 mg/24hr Place 1 patch onto the skin daily.      . rosuvastatin (CRESTOR) 20 MG tablet Take 20 mg by mouth at bedtime.        Marland Kitchen SALINE NASAL SPRAY NA Place 0.65 % into the nose. One spray in each nostril twice a day for nose bleeds       . tiotropium (SPIRIVA) 18 MCG inhalation capsule Place 18 mcg into inhaler and inhale daily.        . valsartan (DIOVAN) 40 MG tablet Take 40 mg by mouth daily. Take one half tablet by mouth daily      . vitamin E 400 UNIT capsule Take 400 Units by mouth daily.        Marland Kitchen warfarin (COUMADIN) 2 MG tablet Take 2 mg by mouth as directed. As of 02/02/2012-Take 3 mg Mon,Tue, Thu, Fri and Sun and 2 mg all other days.      . Pseudoephedrine-DM-GG (ROBITUSSIN CF PO)  Take 10 mLs by mouth every 8 (eight) hours as needed. Cough          Allergies  Allergen Reactions  . Donepezil Hydrochloride     REACTION: heart fluttering  . Morphine Sulfate   . Oxycodone-Acetaminophen     REACTION: Hallucinations  . Percocet (Oxycodone-Acetaminophen)     Review of Systems  Review of Systems  Constitutional: Negative for fever and malaise/fatigue.  HENT: Negative for congestion.   Eyes: Negative for discharge.  Respiratory: Negative for shortness of breath.   Cardiovascular: Negative for chest pain, palpitations and leg swelling.  Gastrointestinal: Negative for nausea, abdominal pain and diarrhea.  Genitourinary: Negative for dysuria.  Musculoskeletal: Negative for falls.  Skin: Negative for rash.  Neurological: Negative for loss of consciousness and headaches.  Endo/Heme/Allergies: Negative for polydipsia.  Psychiatric/Behavioral: Negative for depression and suicidal ideas. The patient is not nervous/anxious and does not have insomnia.     Objective  BP 120/50  Pulse 92  Temp 97.7 F (36.5 C) (Temporal)  Ht 4\' 11"  (1.499 m)  Wt 100 lb (45.36 kg)  BMI 20.20 kg/m2  SpO2 90%  Physical Exam  Physical Exam  Constitutional: She is oriented to person, place, and time and well-developed, well-nourished, and in no distress. No distress.  HENT:  Head: Normocephalic and atraumatic.  Eyes: Conjunctivae normal are normal.  Neck: Neck supple. No thyromegaly present.  Cardiovascular: Normal rate, regular rhythm and normal heart sounds.   No murmur heard. Pulmonary/Chest: Effort normal and breath sounds normal. She has no wheezes.  Abdominal: She exhibits no distension and no mass.  Musculoskeletal: She exhibits no edema.  Lymphadenopathy:    She has no cervical adenopathy.  Neurological: She is alert and oriented to person, place, and time.  Skin: Skin is warm and dry. No rash noted. She is not diaphoretic.  Psychiatric: Memory, affect and judgment  normal.    Lab Results  Component Value Date   TSH 0.080* 02/16/2012   Lab Results  Component Value Date   WBC 6.5 05/07/2012  HGB 11.3* 05/07/2012   HCT 34.8 05/07/2012   MCV 98 05/07/2012   PLT 247 05/07/2012   Lab Results  Component Value Date   CREATININE 1.21* 05/07/2012   BUN 23 05/07/2012   NA 138 05/07/2012   K 4.3 05/07/2012   CL 106 05/07/2012   CO2 25 05/07/2012   Lab Results  Component Value Date   ALT 14 05/07/2012   AST 20 05/07/2012   ALKPHOS 73 05/07/2012   BILITOT 0.6 05/07/2012   Lab Results  Component Value Date   CHOL  Value: 153        ATP III CLASSIFICATION:  <200     mg/dL   Desirable  578-469  mg/dL   Borderline High  >=629    mg/dL   High        52/84/1324   Lab Results  Component Value Date   HDL 83 08/03/2009   Lab Results  Component Value Date   LDLCALC  Value: 62        Total Cholesterol/HDL:CHD Risk Coronary Heart Disease Risk Table                     Men   Women  1/2 Average Risk   3.4   3.3  Average Risk       5.0   4.4  2 X Average Risk   9.6   7.1  3 X Average Risk  23.4   11.0        Use the calculated Patient Ratio above and the CHD Risk Table to determine the patient's CHD Risk.        ATP III CLASSIFICATION (LDL):  <100     mg/dL   Optimal  401-027  mg/dL   Near or Above                    Optimal  130-159  mg/dL   Borderline  253-664  mg/dL   High  >403     mg/dL   Very High 47/42/5956   Lab Results  Component Value Date   TRIG 41 08/03/2009   Lab Results  Component Value Date   CHOLHDL 1.8 08/03/2009     Assessment & Plan  ATRIAL FIBRILLATION Rate controlled, no changes. Unable to get labs today. Patient seen at hematology prior to seeing [place] and they finally had to stop trying to get her blood because she was crying. We hope to check labs with next visit, encouraged to increase hydration prior to next visit  INSOMNIA, CHRONIC Patient resting meds today. Explained the risk of taking medications and patient agrees to go without them  at this time  HYPOTHYROIDISM Patient asymptomatic levothyroxine Will attempt labor at next visit.  CORONARY ARTERY DISEASE Follows with cardiology and is stable at this time. No change  ANEMIA-IRON DEFICIENCY We'll attempt lab work at next visit.

## 2012-09-03 NOTE — Assessment & Plan Note (Signed)
Patient asymptomatic levothyroxine Will attempt labor at next visit.

## 2012-09-03 NOTE — Assessment & Plan Note (Signed)
We'll attempt lab work at next visit.

## 2012-09-03 NOTE — Assessment & Plan Note (Signed)
Rate controlled, no changes. Unable to get labs today. Patient seen at hematology prior to seeing [place] and they finally had to stop trying to get her blood because she was crying. We hope to check labs with next visit, encouraged to increase hydration prior to next visit

## 2012-09-03 NOTE — Assessment & Plan Note (Signed)
Patient resting meds today. Explained the risk of taking medications and patient agrees to go without them at this time

## 2012-09-03 NOTE — Assessment & Plan Note (Signed)
Follows with cardiology and is stable at this time. No change

## 2012-09-03 NOTE — Progress Notes (Signed)
This office note has been dictated.

## 2012-09-03 NOTE — Telephone Encounter (Signed)
Faxed signed order for mammogram and US Breast Left to [facility] [phone]

## 2012-09-04 NOTE — Progress Notes (Signed)
CC:   [facility], [place] [phone] Rita Miller. Jens Som, MD, [facility]  DIAGNOSES: 1. Recurrent iron-deficiency anemia. 2. History of stage I ductal carcinoma of the right breast. 3. Chronic left breast pain.  CURRENT THERAPY:  Observation.  INTERIM HISTORY:  Rita Miller comes in for her followup.  We last saw her back in September.  At that point in time, we checked her blood work. Her ferritin was 592 with an iron saturation of 22%.  She still has problems with pain in the left breast.  Her last I think ultrasound was back in December.  I am not sure why she has not had another one.  With her, she may be refusing them.  Again, she still has the pain in the left breast.  There is no swelling, there is no redness.  Otherwise, she is doing okay.  She has dementia.  She is very pleasant.  PHYSICAL EXAMINATION:  General:  This is an elderly, petite white female in no obvious distress.  Vital signs:  Temperature of 97.5, pulse 80, respiratory rate 16, blood pressure 123/53.  Weight is 100 pounds.  Head and neck:  Normocephalic, atraumatic skull.  There are no ocular or oral lesions.  There are no palpable cervical or supraclavicular lymph nodes. Lungs:  Clear bilaterally.  Cardiac:  Regular rate and rhythm with a normal S1 and S2.  There are no murmurs, rubs, or bruits.  Abdomen: Soft with good bowel sounds.  There is no palpable abdominal mass. There is no palpable hepatosplenomegaly.  Breasts:  Right chest wall mastectomy.  No right chest wall nodules noted.  Left breast is tender. It has been tender to palpation.  No distinct masses noted in the left breast.  There is no discharge.  She does have some areas of candida under the breasts in the intertrigo region.  Extremities:  No clubbing, cyanosis, or edema.  She has osteoarthritic changes.  Abdomen:  Soft with good bowel sounds.  There is no fluid wave.  There is no palpable hepatosplenomegaly.  LABORATORY STUDIES:   Not done as we cannot get blood out of her today.  IMPRESSION:  Rita Miller is an 79 year old white female with chronic left breast pain.  Again, I do not see any issue with respect to breast cancer.  She seems to be doing okay with this in all honesty.  Again, we cannot get blood out of her today.  We will go ahead and plan to get her back in another 4 months.  Hopefully, we will be able to get blood out of her in 4 months.  I will recommend some antifungal powder to be placed under the left breast to help with this candida infection.    ______________________________ Josph Macho, M.D. PRE/MEDQ  D:  09/03/2012  T:  09/04/2012  Job:  [id number]

## 2012-09-06 ENCOUNTER — Telehealth: Payer: Self-pay

## 2012-09-06 NOTE — Telephone Encounter (Signed)
I spoke to pts daughter about the Diovan not being covered by insurance. Pts daughter doesn't believe her mom has ever tried Losartan before and if MD thinks insurance will pay for Losartan to send that to pharmacy.  Please advise?

## 2012-09-06 NOTE — Telephone Encounter (Signed)
Ok d/c Diovan and start Losartan 25 mg daily, disp #30 with 1 rf. Needs bp check in 3-4 weeks after starting new med.

## 2012-09-07 MED ORDER — LOSARTAN POTASSIUM 25 MG PO TABS: 25.0000 mg | ORAL_TABLET | Freq: Every day | ORAL | Status: DC

## 2012-09-07 NOTE — Telephone Encounter (Signed)
Informed pts daughter and she asked for me to call and inform pts home and to schedule appt. I left a message with the receptionist to return my call because she stated there was no one there to take my message.  RX sent to pharmacy

## 2012-09-10 NOTE — Telephone Encounter (Signed)
[place] informed and states she will take care of this

## 2012-09-11 ENCOUNTER — Telehealth: Payer: Self-pay

## 2012-09-11 NOTE — Telephone Encounter (Signed)
Patients center states that they ran out of PT/INR test strips and wanted a verbal that they can wait to redraw on Thur (09-13-12)  Per MD ok to wait. Center informed

## 2012-09-14 ENCOUNTER — Telehealth: Payer: Self-pay | Admitting: Internal Medicine

## 2012-09-14 NOTE — Telephone Encounter (Signed)
[facility] called stating that patients INR results were 2.95. The nursing home would like a callback as to the current coumedin dose and when is the next INR due date?

## 2012-09-15 ENCOUNTER — Other Ambulatory Visit: Payer: Self-pay | Admitting: Family Medicine

## 2012-09-15 NOTE — Telephone Encounter (Signed)
Coumadin is 2 mg on Weds and Sat and 3 mg on other days, recheck INR in 2 weeks

## 2012-09-17 NOTE — Telephone Encounter (Signed)
[facility] called back regarding this. I read off what Dr. Abner Greenspan recommends.

## 2012-09-17 NOTE — Telephone Encounter (Signed)
Thanks, theres also a copy in the folder that [employer] picks up

## 2012-09-24 ENCOUNTER — Telehealth: Payer: Self-pay

## 2012-09-24 NOTE — Telephone Encounter (Signed)
There is no Diovan 20 mg tab?

## 2012-09-24 NOTE — Telephone Encounter (Signed)
Tonya at [facility] called stating that Dr Rodena Medin started patient on a BP (Losartan) they can only use the formulary of Diovan 20 mg. Please advise if patient can be switched to this.

## 2012-09-25 ENCOUNTER — Ambulatory Visit (INDEPENDENT_AMBULATORY_CARE_PROVIDER_SITE_OTHER): Payer: Medicare Other | Admitting: Family Medicine

## 2012-09-25 ENCOUNTER — Encounter: Payer: Self-pay | Admitting: Family Medicine

## 2012-09-25 ENCOUNTER — Ambulatory Visit (HOSPITAL_BASED_OUTPATIENT_CLINIC_OR_DEPARTMENT_OTHER)
Admission: RE | Admit: 2012-09-25 | Discharge: 2012-09-25 | Disposition: A | Discharge status: Home / Self Care | Payer: Medicare Other | Source: Ambulatory Visit | Attending: Family Medicine | Admitting: Family Medicine

## 2012-09-25 ENCOUNTER — Telehealth: Payer: Self-pay

## 2012-09-25 VITALS — BP 122/60 | HR 73 | Temp 97.3°F | Ht 59.0 in | Wt 100.8 lb

## 2012-09-25 DIAGNOSIS — D509 Iron deficiency anemia, unspecified: Secondary | ICD-10-CM

## 2012-09-25 DIAGNOSIS — I517 Cardiomegaly: Secondary | ICD-10-CM | POA: Insufficient documentation

## 2012-09-25 DIAGNOSIS — R0789 Other chest pain: Secondary | ICD-10-CM

## 2012-09-25 DIAGNOSIS — R0602 Shortness of breath: Secondary | ICD-10-CM | POA: Insufficient documentation

## 2012-09-25 DIAGNOSIS — R071 Chest pain on breathing: Secondary | ICD-10-CM

## 2012-09-25 DIAGNOSIS — I10 Essential (primary) hypertension: Secondary | ICD-10-CM

## 2012-09-25 DIAGNOSIS — Z853 Personal history of malignant neoplasm of breast: Secondary | ICD-10-CM

## 2012-09-25 DIAGNOSIS — R079 Chest pain, unspecified: Secondary | ICD-10-CM | POA: Insufficient documentation

## 2012-09-25 DIAGNOSIS — J438 Other emphysema: Secondary | ICD-10-CM | POA: Insufficient documentation

## 2012-09-25 DIAGNOSIS — Z7901 Long term (current) use of anticoagulants: Secondary | ICD-10-CM

## 2012-09-25 HISTORY — DX: Essential (primary) hypertension: I10

## 2012-09-25 MED ORDER — VALSARTAN 40 MG PO TABS: 40.0000 mg | ORAL_TABLET | Freq: Every day | ORAL | Status: DC

## 2012-09-25 NOTE — Patient Instructions (Addendum)

## 2012-09-25 NOTE — Assessment & Plan Note (Signed)
Patient had to change for Losartan for insurance purposes and now her domicilliary needs [place] to switch her to Diovan for their formulary purposes. Will make the change today and recheck bp in 2 months.

## 2012-09-25 NOTE — Progress Notes (Signed)
Patient ID: Rita Miller, female   DOB: [birth year], 79 y.o.   MRN: [id number] Rita Miller [id number] 05/19/1930 09/25/2012      Progress Note-Follow Up  Subjective  Chief Complaint  Chief Complaint  Patient presents with  . Follow-up    3-4 week     HPI  Patient is an 79 year old Caucasian female who is in today for followup on her blood pressure. She is tolerating olmesartan and unfortunately her home needs [place] to switch her to Diovan. No concerning side effects. No recent illness. No congestion, palpitations, shortness of breath, GI or GU complaints. She is eating well. She's complaining of anterior chest wall pain. Pain is worse with palpation. She is unable to lie on the left chest wall secondary to the discomfort  Past Medical History  Diagnosis Date  . CAD (coronary artery disease)   . History of CVA (cerebrovascular accident)   . Atrial fibrillation     peristent  . Hyperlipemia   . COPD (chronic obstructive pulmonary disease)   . Epistaxis     recurrent  . Osteoporosis   . Hypothyroidism   . History of pulmonary embolism   . History of colonic diverticulitis   . History of breast cancer   . Asthma   . Iron deficiency anemia   . History of transfusion of whole blood   . LBBB (left bundle branch block)     history of  . Aortal stenosis     severe  . Pacemaker     bradycardia s/p pacemaker implant  . PAD (peripheral artery disease)     left leg occluded viabahn stents x 3 left SFA  . Tachycardia-bradycardia syndrome     s/p PPM  . Cancer   . Arthritis   . Stroke   . HTN (hypertension) 09/25/2012  . HTN (hypertension) 09/25/2012    Past Surgical History  Procedure Laterality Date  . Thyroidectomy      due to cancer  . Total hip arthroplasty    . Abdominal hysterectomy    . Mastectomy    . Mitral valve repair    . Intraocular lens insertion      left eye implant  . Pacemaker placement      for tachy/brady syndrome    Family History  Problem  Relation Age of Onset  . Breast cancer    . Colon cancer    . Diabetes    . Hyperlipidemia    . Hypertension    . Heart disease Mother     unknown heart disease    History   Social History  . Marital Status: Widowed    Spouse Name: N/A    Number of Children: N/A  . Years of Education: N/A   Occupational History  . Not on file.   Social History Main Topics  . Smoking status: Former Games developer  . Smokeless tobacco: Never Used  . Alcohol Use: No  . Drug Use: No  . Sexually Active: Not on file   Other Topics Concern  . Not on file   Social History Narrative   Retired   Widow    1 daughter             Current Outpatient Prescriptions on File Prior to Visit  Medication Sig Dispense Refill  . diltiazem (CARDIZEM CD) 240 MG 24 hr capsule Take 240 mg by mouth daily.       . fluticasone (FLOVENT HFA) 44 MCG/ACT inhaler Inhale 1 puff into the lungs 2 (  two) times daily.        Marland Kitchen levothyroxine (SYNTHROID, LEVOTHROID) 75 MCG tablet Take 1 tablet (75 mcg total) by mouth daily.      . Melatonin 3 MG TABS Take 3 mg by mouth at bedtime as needed.       . memantine (NAMENDA) 10 MG tablet Take 10 mg by mouth daily.      Marland Kitchen omeprazole (PRILOSEC) 20 MG capsule 20 mg. Take one capsule daily. Take on and empty stomach- Do no crush.      . polyethylene glycol (MIRALAX / GLYCOLAX) packet Take 17 g by mouth daily. Mix 17gm in 6-8 ounces of liquid daily.       . Pseudoephedrine-DM-GG (ROBITUSSIN CF PO) Take 10 mLs by mouth every 8 (eight) hours as needed. Cough        . rivastigmine (EXELON) 9.5 mg/24hr Place 1 patch onto the skin daily.      . rosuvastatin (CRESTOR) 20 MG tablet Take 20 mg by mouth at bedtime.        Marland Kitchen SALINE NASAL SPRAY NA Place 0.65 % into the nose. One spray in each nostril twice a day for nose bleeds       . tiotropium (SPIRIVA) 18 MCG inhalation capsule Place 18 mcg into inhaler and inhale daily.        . vitamin E 400 UNIT capsule Take 400 Units by mouth daily.        Marland Kitchen  warfarin (COUMADIN) 2 MG tablet Take 2 mg by mouth as directed. As of 02/02/2012-Take 3 mg Mon,Tue, Thu, Fri and Sun and 2 mg all other days.       No current facility-administered medications on file prior to visit.    Allergies  Allergen Reactions  . Donepezil Hydrochloride     REACTION: heart fluttering  . Morphine Sulfate   . Oxycodone-Acetaminophen     REACTION: Hallucinations  . Percocet (Oxycodone-Acetaminophen)     Review of Systems  Review of Systems  Constitutional: Negative for fever and malaise/fatigue.  HENT: Negative for congestion.   Eyes: Negative for discharge.  Respiratory: Negative for shortness of breath.   Cardiovascular: Positive for chest pain. Negative for palpitations and leg swelling.  Gastrointestinal: Negative for nausea, abdominal pain, diarrhea and blood in stool.  Genitourinary: Negative for dysuria.  Musculoskeletal: Negative for falls.  Skin: Negative for rash.  Neurological: Negative for loss of consciousness and headaches.  Endo/Heme/Allergies: Negative for polydipsia. Does not bruise/bleed easily.  Psychiatric/Behavioral: Negative for depression and suicidal ideas. The patient is not nervous/anxious and does not have insomnia.     Objective  BP 122/60  Pulse 73  Temp(Src) 97.3 F (36.3 C) (Oral)  Ht 4\' 11"  (1.499 m)  Wt 100 lb 12.8 oz (45.723 kg)  BMI 20.35 kg/m2  SpO2 95%  Physical Exam  Physical Exam  Constitutional: She is oriented to person, place, and time and well-developed, well-nourished, and in no distress. No distress.  HENT:  Head: Normocephalic and atraumatic.  Eyes: Conjunctivae are normal.  Neck: Neck supple. No thyromegaly present.  Cardiovascular: Normal rate, regular rhythm and normal heart sounds.   No murmur heard. Pulmonary/Chest: Effort normal and breath sounds normal. She has no wheezes. She exhibits tenderness.  Pain with palp on left anterior chest wall, from sternum to axillae, no lesions palpable   Abdominal: She exhibits no distension and no mass.  Genitourinary:  Surgically absent right breast, left breast tissue without any lesions, skin changes or discharge. No tenderness  Musculoskeletal: She exhibits no edema.  Lymphadenopathy:    She has no cervical adenopathy.  Neurological: She is alert and oriented to person, place, and time.  Skin: Skin is warm and dry. No rash noted. She is not diaphoretic.  Psychiatric: Memory, affect and judgment normal.    Lab Results  Component Value Date   TSH 0.080* 02/16/2012   Lab Results  Component Value Date   WBC 6.5 05/07/2012   HGB 11.3* 05/07/2012   HCT 34.8 05/07/2012   MCV 98 05/07/2012   PLT 247 05/07/2012   Lab Results  Component Value Date   CREATININE 1.21* 05/07/2012   BUN 23 05/07/2012   NA 138 05/07/2012   K 4.3 05/07/2012   CL 106 05/07/2012   CO2 25 05/07/2012   Lab Results  Component Value Date   ALT 14 05/07/2012   AST 20 05/07/2012   ALKPHOS 73 05/07/2012   BILITOT 0.6 05/07/2012   Lab Results  Component Value Date   CHOL  Value: 153        ATP III CLASSIFICATION:  <200     mg/dL   Desirable  161-096  mg/dL   Borderline High  >=045    mg/dL   High        40/98/1191   Lab Results  Component Value Date   HDL 83 08/03/2009   Lab Results  Component Value Date   LDLCALC  Value: 62        Total Cholesterol/HDL:CHD Risk Coronary Heart Disease Risk Table                     Men   Women  1/2 Average Risk   3.4   3.3  Average Risk       5.0   4.4  2 X Average Risk   9.6   7.1  3 X Average Risk  23.4   11.0        Use the calculated Patient Ratio above and the CHD Risk Table to determine the patient's CHD Risk.        ATP III CLASSIFICATION (LDL):  <100     mg/dL   Optimal  478-295  mg/dL   Near or Above                    Optimal  130-159  mg/dL   Borderline  621-308  mg/dL   High  >657     mg/dL   Very High 84/69/6295   Lab Results  Component Value Date   TRIG 41 08/03/2009   Lab Results  Component Value Date   CHOLHDL  1.8 08/03/2009     Assessment & Plan  HTN (hypertension) Patient had to change for Losartan for insurance purposes and now her domicilliary needs [place] to switch her to Diovan for their formulary purposes. Will make the change today and recheck bp in 2 months.  CHEST WALL PAIN, ANTERIOR Tender to palp over left anterior chest wall pain. No lesion palpated and cxr unremarkable. Breast tissue is unremarkable on palpation. No lesions or tenderness or skin changes noted.  BREAST CANCER, HX OF Has appt in April for folow up surveillance  ANEMIA-IRON DEFICIENCY Will request copies of labwork done at her home, if no recent cbc will need to proceed with repeat cbc  Anticoagulant long-term use INR is stable. Will continue to monitor, continue coumadin

## 2012-09-25 NOTE — Assessment & Plan Note (Signed)
Has appt in April for folow up surveillance

## 2012-09-25 NOTE — Telephone Encounter (Signed)
Message copied by Court Joy on Tue Sep 25, 2012  4:26 PM ------      Message from: Danise Edge A      Created: Tue Sep 25, 2012  3:40 PM       Please check with the home and have them send [place] copies of all labs done in past 6 months. I do not have any labs in the chart since 9/13. ------

## 2012-09-25 NOTE — Telephone Encounter (Signed)
I called and spoke with Archie Patten and she states the Losartan was 20 mg so what ever is equivalent for Diovan? Please advise?

## 2012-09-25 NOTE — Telephone Encounter (Signed)
I spoke to Pana at [facility] and she stated she would fax this information

## 2012-09-25 NOTE — Telephone Encounter (Signed)
MD took care of this at visit

## 2012-09-25 NOTE — Assessment & Plan Note (Signed)
Will request copies of labwork done at her home, if no recent cbc will need to proceed with repeat cbc

## 2012-09-25 NOTE — Assessment & Plan Note (Signed)
Tender to palp over left anterior chest wall pain. No lesion palpated and cxr unremarkable. Breast tissue is unremarkable on palpation. No lesions or tenderness or skin changes noted.

## 2012-09-25 NOTE — Telephone Encounter (Signed)
Losartan comes in 25 mg not 20 would say Diovan 40 mg to 80 mg would be equivalent. Would start with 40 mg daily and check bp in 4 weeks to assess if this is the right dose. Disp #30, 1 rf

## 2012-09-25 NOTE — Assessment & Plan Note (Signed)
INR is stable. Will continue to monitor, continue coumadin

## 2012-09-26 NOTE — Progress Notes (Signed)
Quick Note:  Patient daughter Rita Miller) Informed and voiced understanding ______

## 2012-09-27 ENCOUNTER — Telehealth: Payer: Self-pay | Admitting: Internal Medicine

## 2012-09-27 NOTE — Telephone Encounter (Signed)
Morique from [facility] called with patients most recent PT/INR results. Results are 2.6 and were taken at 3:38am this morning. Morique would like to know when patients next order will be due?

## 2012-09-27 NOTE — Telephone Encounter (Signed)
Continue current dosing of coumadin and recheck PT/INR in 2 weeks

## 2012-09-27 NOTE — Telephone Encounter (Signed)
Verified with Gabriel Rung that pt's current Coumadin dose is 2mg  on Weds and Sat and 3 mg on other days.  Please advise re: dose adjustment and next INR check.

## 2012-09-28 NOTE — Telephone Encounter (Signed)
Notified Marique.

## 2012-10-02 ENCOUNTER — Telehealth: Payer: Self-pay

## 2012-10-02 NOTE — Telephone Encounter (Signed)
I spoke with front desk at [facility] to verify if pt is taking Cozaar or Lorsartan. Pt can take either one per MD but not both.  Front desk took a message to have nurse give me a call

## 2012-10-02 NOTE — Telephone Encounter (Signed)
Spoke to [place] at [facility] about the 2 blood pressure medications.Rita Miller stated to cross out the Losartan on there copies becaues pt is not taking the Cozaar. Pt is taking the Diovan

## 2012-10-11 ENCOUNTER — Telehealth: Payer: Self-pay

## 2012-10-11 NOTE — Telephone Encounter (Signed)
No changes to Coumadin dose but recheck INR in 1 week

## 2012-10-11 NOTE — Telephone Encounter (Signed)
Marique with [facility] called stating pts PT/INR 3.3

## 2012-10-15 NOTE — Telephone Encounter (Signed)
marique informed

## 2012-10-22 ENCOUNTER — Telehealth: Payer: Self-pay

## 2012-10-22 NOTE — Telephone Encounter (Signed)
Perfect no changes to Coumadin dose, can move PT/INR check to every 3rd week at this time.

## 2012-10-22 NOTE — Telephone Encounter (Signed)
[name] informed

## 2012-10-22 NOTE — Telephone Encounter (Signed)
PT/INR results 2.19 please advise?

## 2012-10-30 ENCOUNTER — Telehealth: Payer: Self-pay

## 2012-10-30 NOTE — Telephone Encounter (Signed)
Maurice from [place] left a message stating that she would like to know if pts Levothyroxine is supposed to be 50 or 75 mcg? Please advise?

## 2012-10-30 NOTE — Telephone Encounter (Signed)
After reviewing believe she should be taking Levothyroxine 75 mcg daily. What has she been taking? Recheck tsh and T4 in 8-10 weeks

## 2012-10-31 NOTE — Telephone Encounter (Signed)
I spoke to Rita Miller at [facility] and she states pt is taking 75 mcg. She will have pts TSH and T4 checked in 8-10 weeks

## 2012-11-12 ENCOUNTER — Telehealth: Payer: Self-pay

## 2012-11-12 NOTE — Telephone Encounter (Signed)
No change in dosing, recheck INR in 2 weeks

## 2012-11-12 NOTE — Telephone Encounter (Signed)
pts PT/INR was 3.02- pt takes 3 mg coumadin on M,T, Th, Fri, and Sun. 2mg  coumadin on Sat and Wed. Please advise?

## 2012-11-13 NOTE — Telephone Encounter (Signed)
Spoke with [facility], understood & agreed/SLS

## 2012-11-26 ENCOUNTER — Encounter: Payer: Self-pay | Admitting: *Deleted

## 2012-11-26 ENCOUNTER — Telehealth: Payer: Self-pay | Admitting: *Deleted

## 2012-11-26 NOTE — Telephone Encounter (Signed)
Continue current dosing of Coumadin and repeat INR check in 3-4 weeks

## 2012-11-26 NOTE — Telephone Encounter (Signed)
Received message from Hilda Lias at [facility] stating PT/INR today is 2.25. Pt's current coumadin dose is 3mg  on Mon, Tu, [place], Fri and Sun, 2mg  on Wed and Sat. Please advise.

## 2012-11-26 NOTE — Telephone Encounter (Signed)
Received message from Hilda Lias at [facility] stating PT/INR

## 2012-11-26 NOTE — Telephone Encounter (Signed)
Notified Clinical cytogeneticist at [facility].

## 2012-12-03 ENCOUNTER — Encounter: Payer: Self-pay | Admitting: Family Medicine

## 2012-12-03 ENCOUNTER — Ambulatory Visit (HOSPITAL_BASED_OUTPATIENT_CLINIC_OR_DEPARTMENT_OTHER): Payer: Medicare Other | Admitting: Medical

## 2012-12-03 ENCOUNTER — Ambulatory Visit (HOSPITAL_BASED_OUTPATIENT_CLINIC_OR_DEPARTMENT_OTHER)
Admission: RE | Admit: 2012-12-03 | Discharge: 2012-12-03 | Disposition: A | Discharge status: Home / Self Care | Payer: Medicare Other | Source: Ambulatory Visit | Attending: Family Medicine | Admitting: Family Medicine

## 2012-12-03 ENCOUNTER — Ambulatory Visit (INDEPENDENT_AMBULATORY_CARE_PROVIDER_SITE_OTHER): Payer: Medicare Other | Admitting: Family Medicine

## 2012-12-03 ENCOUNTER — Other Ambulatory Visit (HOSPITAL_BASED_OUTPATIENT_CLINIC_OR_DEPARTMENT_OTHER): Payer: Medicare Other | Admitting: Lab

## 2012-12-03 VITALS — BP 128/58 | HR 100 | Temp 97.4°F | Ht 59.0 in | Wt 103.0 lb

## 2012-12-03 VITALS — BP 104/60 | HR 62 | Temp 97.4°F | Resp 16 | Ht 59.0 in | Wt 103.0 lb

## 2012-12-03 DIAGNOSIS — B3789 Other sites of candidiasis: Secondary | ICD-10-CM

## 2012-12-03 DIAGNOSIS — E785 Hyperlipidemia, unspecified: Secondary | ICD-10-CM

## 2012-12-03 DIAGNOSIS — N644 Mastodynia: Secondary | ICD-10-CM

## 2012-12-03 DIAGNOSIS — D509 Iron deficiency anemia, unspecified: Secondary | ICD-10-CM

## 2012-12-03 DIAGNOSIS — I10 Essential (primary) hypertension: Secondary | ICD-10-CM

## 2012-12-03 DIAGNOSIS — R079 Chest pain, unspecified: Secondary | ICD-10-CM

## 2012-12-03 DIAGNOSIS — Z853 Personal history of malignant neoplasm of breast: Secondary | ICD-10-CM

## 2012-12-03 DIAGNOSIS — I4891 Unspecified atrial fibrillation: Secondary | ICD-10-CM

## 2012-12-03 DIAGNOSIS — R071 Chest pain on breathing: Secondary | ICD-10-CM | POA: Insufficient documentation

## 2012-12-03 LAB — COMPREHENSIVE METABOLIC PANEL
AST: 26 U/L (ref 0–37)
Alkaline Phosphatase: 92 U/L (ref 39–117)
BUN: 20 mg/dL (ref 6–23)
Calcium: 9.3 mg/dL (ref 8.4–10.5)
Chloride: 105 mEq/L (ref 96–112)
Creatinine, Ser: 1.23 mg/dL — ABNORMAL HIGH (ref 0.50–1.10)
Total Bilirubin: 0.6 mg/dL (ref 0.3–1.2)

## 2012-12-03 LAB — CBC WITH DIFFERENTIAL (CANCER CENTER ONLY)
BASO%: 0.9 % (ref 0.0–2.0)
Eosinophils Absolute: 0.2 10*3/uL (ref 0.0–0.5)
LYMPH#: 1.3 10*3/uL (ref 0.9–3.3)
MONO#: 0.6 10*3/uL (ref 0.1–0.9)
NEUT#: 3.5 10*3/uL (ref 1.5–6.5)
Platelets: 205 10*3/uL (ref 145–400)
RBC: 3.77 10*6/uL (ref 3.70–5.32)
RDW: 14.3 % (ref 11.1–15.7)
WBC: 5.6 10*3/uL (ref 3.9–10.0)

## 2012-12-03 LAB — IRON AND TIBC
%SAT: 29 % (ref 20–55)
Iron: 84 ug/dL (ref 42–145)
TIBC: 289 ug/dL (ref 250–470)
UIBC: 205 ug/dL (ref 125–400)

## 2012-12-03 MED ORDER — ACETAMINOPHEN 500 MG PO TABS: ORAL_TABLET | ORAL | Status: DC

## 2012-12-03 NOTE — Assessment & Plan Note (Signed)
Continues with daily pain, mgm's have been stable is following with oncology, cxr unremarkable today. Try Tylenol 1gm po bid and see if that is helpful

## 2012-12-03 NOTE — Assessment & Plan Note (Signed)
Tolerating Crestor, cont current dose and check lipids with next visit

## 2012-12-03 NOTE — Progress Notes (Signed)
DIAGNOSES: 1. Recurrent iron-deficiency anemia. 2. History of stage I ductal carcinoma of the right breast. 3. Chronic left breast pain.  CURRENT THERAPY:   #1.  IV iron as indicated.  Last dose of IV iron was back on 01/06/2012.  INTERIM HISTORY:   Rita Miller presents today for an office followup visit.  Overall, she, reports, that she's doing relatively well.  She still continues to have some chronic left breast pain.  Her last diagnostic mammogram of the left breast was back in January.  Unfortunately, I do not have those results however, her caretaker, reports, that there was no abnormalities.  She does have a right breast, mastectomy.  The last, time, she received, IV iron was back on 01/06/2012.  She did quite well with this.  Her last iron panel back on 05/07/2012, revealed a ferritin of 592, an iron of 49, with 22% saturation.  She does continue to stay at a  nursing facility, [facility]. She does have some dementia.  She, reports, a good appetite.  She denies any nausea, vomiting, diarrhea, constipation, chest pain, shortness breath, or cough.  She denies any fevers, chills, or night sweats.  She denies any abdominal pain, any lower leg swelling.  She denies any obvious, or abnormal bleeding.  She denies any headaches, visual changes, or rashes.  She denies any bony, pain.    Review of Systems: Constitutional:Negative for malaise/fatigue, fever, chills, weight loss, diaphoresis, activity change, appetite change, and unexpected weight change.  HEENT: Negative for double vision, blurred vision, visual loss, ear pain, tinnitus, congestion, rhinorrhea, epistaxis sore throat or sinus disease, oral pain/lesion, tongue soreness Respiratory: Negative for cough, chest tightness, shortness of breath, wheezing and stridor.  Cardiovascular: Negative for chest pain, palpitations, leg swelling, orthopnea, PND, DOE or claudication Gastrointestinal: Negative for nausea, vomiting, abdominal pain,  diarrhea, constipation, blood in stool, melena, hematochezia, abdominal distention, anal bleeding, rectal pain, anorexia and hematemesis.  Genitourinary: Negative for dysuria, frequency, hematuria,  Musculoskeletal: Negative for myalgias, back pain, joint swelling, arthralgias and gait problem.  Skin: Negative for rash, color change, pallor and wound.  Neurological:. Negative for dizziness/light-headedness, tremors, seizures, syncope, facial asymmetry, speech difficulty, weakness, numbness, headaches and paresthesias.  Hematological: Negative for adenopathy. Does not bruise/bleed easily.  Psychiatric/Behavioral:  Negative for depression, no loss of interest in normal activity or change in sleep pattern.   Physical Exam: This is an 79 year old, elderly, petite, white female, in no obvious distress Vitals: temperature 90.7, pulse 4, pulse 62, respirations 16, blood pressure 104/60, weight 103 pounds  HEENT reveals a normocephalic, atraumatic skull, no scleral icterus, no oral lesions  Neck is supple without any cervical or supraclavicular adenopathy.  Lungs are clear to auscultation bilaterally. There are no wheezes, rales or rhonci Cardiac is regular rate and rhythm with a normal S1 and S2. There are no murmurs, rubs, or bruits.  Abdomen is soft with good bowel sounds, there is no palpable mass. There is no palpable hepatosplenomegaly. There is no palpable fluid wave.  Musculoskeletal no tenderness of the spine, ribs, or hips.  Extremities there are no clubbing, cyanosis, or edema.  Skin no petechia, purpura or ecchymosis Neurologic is nonfocal.  breast: Right chest wall mastectomy.  No right chest wall nodules noted.  Left breast is somewhat tender.  It is tender to palpation.  No distinct masses noted in the left breast.  There is no discharge.  She does have some areas of Candida underneath her left breast in the intertrigo region.  There is no left axillary adenopathy.    Laboratory  Data: White count 5.6, hemoglobin, 2.1, hematocrit 37.7, platelets 205,000  Current Outpatient Prescriptions on File Prior to Visit  Medication Sig Dispense Refill  . diltiazem (CARDIZEM CD) 240 MG 24 hr capsule Take 240 mg by mouth daily.       . fluticasone (FLOVENT HFA) 44 MCG/ACT inhaler Inhale 1 puff into the lungs 2 (two) times daily.        Marland Kitchen levothyroxine (SYNTHROID, LEVOTHROID) 75 MCG tablet Take 1 tablet (75 mcg total) by mouth daily.      . Melatonin 3 MG TABS Take 3 mg by mouth at bedtime as needed.       . memantine (NAMENDA) 10 MG tablet Take 10 mg by mouth daily.      Marland Kitchen omeprazole (PRILOSEC) 20 MG capsule 20 mg. Take one capsule daily. Take on and empty stomach- Do no crush.      . polyethylene glycol (MIRALAX / GLYCOLAX) packet Take 17 g by mouth daily. Mix 17gm in 6-8 ounces of liquid daily.       . Pseudoephedrine-DM-GG (ROBITUSSIN CF PO) Take 10 mLs by mouth every 8 (eight) hours as needed. Cough        . rivastigmine (EXELON) 9.5 mg/24hr Place 1 patch onto the skin daily.      . rosuvastatin (CRESTOR) 20 MG tablet Take 20 mg by mouth at bedtime.        Marland Kitchen SALINE NASAL SPRAY NA Place 0.65 % into the nose. One spray in each nostril twice a day for nose bleeds       . tiotropium (SPIRIVA) 18 MCG inhalation capsule Place 18 mcg into inhaler and inhale daily.        . valsartan (DIOVAN) 40 MG tablet Take 1 tablet (40 mg total) by mouth daily.  30 tablet  5  . vitamin E 400 UNIT capsule Take 400 Units by mouth daily.        Marland Kitchen warfarin (COUMADIN) 2 MG tablet Take 2 mg by mouth as directed. As of 02/02/2012-Take 3 mg Mon,Tue, Thu, Fri and Sun and 2 mg all other days.       No current facility-administered medications on file prior to visit.   Assessment/Plan: This is an 79 year old, white female, with the following issues:  #1.  Intermittent iron deficiency anemia.Marland Kitchen  Her blood work looks good.  Today.  We are checking an iron panel.    #2.  History of stage I ductal carcinoma  of the right breast., there does not appear to be any type of recurrence.  She will continue with diagnostic left breast.  Mammograms.    #3.  Chronic left breast pain..  This is been a chronic issue.  She has had multiple ultrasounds in the past.  #4.  Candida infection.  This is under the left breast.  I did recommend antifungal powder.  #5.  Followup.  Rita Miller will follow back up with [place] in 4 months, but before then should there be questions or concerns.

## 2012-12-03 NOTE — Progress Notes (Signed)
Patient ID: Rita Miller, female   DOB: 21-Nov-1929, 79 y.o.   MRN: [id number] Rita Miller [id number] 1930/05/04 12/03/2012      Progress Note-Follow Up  Subjective  Chief Complaint  Chief Complaint  Patient presents with  . Follow-up    3 month    HPI  In today accompanied by her knee from her group home doing fairly well. She's just come from oncology. She continues to complain of left breast pain. It is not worsening but is persistent. Tends to hurt worse when she applies pressure or lies on it. At times reaching or bending can also increase the pain. No discharge or itching. She does have a history of a right mastectomy for breast cancer did have her last mammogram back in January. She denies anorexia but she does note some mild nausea. No vomiting or diarrhea. Bowels are moving well. Tolerating current meds and no other acute complaints such as chest pain, palpitations, shortness of breath or fevers  Past Medical History  Diagnosis Date  . CAD (coronary artery disease)   . History of CVA (cerebrovascular accident)   . Atrial fibrillation     peristent  . Hyperlipemia   . COPD (chronic obstructive pulmonary disease)   . Epistaxis     recurrent  . Osteoporosis   . Hypothyroidism   . History of pulmonary embolism   . History of colonic diverticulitis   . History of breast cancer   . Asthma   . Iron deficiency anemia   . History of transfusion of whole blood   . LBBB (left bundle branch block)     history of  . Aortal stenosis     severe  . Pacemaker     bradycardia s/p pacemaker implant  . PAD (peripheral artery disease)     left leg occluded viabahn stents x 3 left SFA  . Tachycardia-bradycardia syndrome     s/p PPM  . Cancer   . Arthritis   . Stroke   . HTN (hypertension) 09/25/2012  . HTN (hypertension) 09/25/2012    Past Surgical History  Procedure Laterality Date  . Thyroidectomy      due to cancer  . Total hip arthroplasty    . Abdominal hysterectomy     . Mastectomy    . Mitral valve repair    . Intraocular lens insertion      left eye implant  . Pacemaker placement      for tachy/brady syndrome    Family History  Problem Relation Age of Onset  . Breast cancer    . Colon cancer    . Diabetes    . Hyperlipidemia    . Hypertension    . Heart disease Mother     unknown heart disease    History   Social History  . Marital Status: Widowed    Spouse Name: N/A    Number of Children: N/A  . Years of Education: N/A   Occupational History  . Not on file.   Social History Main Topics  . Smoking status: Former Games developer  . Smokeless tobacco: Never Used  . Alcohol Use: No  . Drug Use: No  . Sexually Active: Not on file   Other Topics Concern  . Not on file   Social History Narrative   Retired   Widow    1 daughter             Current Outpatient Prescriptions on File Prior to Visit  Medication Sig Dispense Refill  .  diltiazem (CARDIZEM CD) 240 MG 24 hr capsule Take 240 mg by mouth daily.       . fluticasone (FLOVENT HFA) 44 MCG/ACT inhaler Inhale 1 puff into the lungs 2 (two) times daily.        Marland Kitchen levothyroxine (SYNTHROID, LEVOTHROID) 75 MCG tablet Take 1 tablet (75 mcg total) by mouth daily.      . Melatonin 3 MG TABS Take 3 mg by mouth at bedtime as needed.       . memantine (NAMENDA) 10 MG tablet Take 10 mg by mouth daily.      Marland Kitchen omeprazole (PRILOSEC) 20 MG capsule 20 mg. Take one capsule daily. Take on and empty stomach- Do no crush.      . polyethylene glycol (MIRALAX / GLYCOLAX) packet Take 17 g by mouth daily. Mix 17gm in 6-8 ounces of liquid daily.       . Pseudoephedrine-DM-GG (ROBITUSSIN CF PO) Take 10 mLs by mouth every 8 (eight) hours as needed. Cough        . rivastigmine (EXELON) 9.5 mg/24hr Place 1 patch onto the skin daily.      . rosuvastatin (CRESTOR) 20 MG tablet Take 20 mg by mouth at bedtime.        Marland Kitchen SALINE NASAL SPRAY NA Place 0.65 % into the nose. One spray in each nostril twice a day for nose  bleeds       . tiotropium (SPIRIVA) 18 MCG inhalation capsule Place 18 mcg into inhaler and inhale daily.        . valsartan (DIOVAN) 40 MG tablet Take 1 tablet (40 mg total) by mouth daily.  30 tablet  5  . vitamin E 400 UNIT capsule Take 400 Units by mouth daily.        Marland Kitchen warfarin (COUMADIN) 2 MG tablet Take 2 mg by mouth as directed. Jantoven- As of 12-03-12-Take 3 mg Mon,Tue, Thu, Fri and Sun and 2 mg all other days.       No current facility-administered medications on file prior to visit.    Allergies  Allergen Reactions  . Donepezil Hydrochloride     REACTION: heart fluttering  . Morphine Sulfate   . Oxycodone-Acetaminophen     REACTION: Hallucinations  . Percocet (Oxycodone-Acetaminophen)     Review of Systems  Review of Systems  Constitutional: Negative for fever and malaise/fatigue.  HENT: Negative for congestion.   Eyes: Negative for discharge.  Respiratory: Negative for shortness of breath.   Cardiovascular: Negative for chest pain, palpitations and leg swelling.  Gastrointestinal: Negative for nausea, abdominal pain and diarrhea.  Genitourinary: Negative for dysuria.  Musculoskeletal: Negative for falls.  Skin: Negative for rash.  Neurological: Negative for loss of consciousness and headaches.  Endo/Heme/Allergies: Negative for polydipsia.  Psychiatric/Behavioral: Negative for depression and suicidal ideas. The patient is not nervous/anxious and does not have insomnia.     Objective  BP 128/58  Pulse 100  Temp(Src) 97.4 F (36.3 C) (Oral)  Ht 4\' 11"  (1.499 m)  Wt 103 lb 0.6 oz (46.739 kg)  BMI 20.8 kg/m2  Physical Exam  Physical Exam  Constitutional: She is oriented to person, place, and time and well-developed, well-nourished, and in no distress. No distress.  HENT:  Head: Normocephalic and atraumatic.  Eyes: Conjunctivae are normal.  Neck: Neck supple. No thyromegaly present.  Cardiovascular:  Murmur heard. irregular  Pulmonary/Chest: Effort  normal and breath sounds normal. She has no wheezes.  Abdominal: She exhibits no distension and no mass.  Genitourinary:  Right breast surgically absent, left breast no obvious mass but patient reports tender to palp  Musculoskeletal: She exhibits no edema.  Lymphadenopathy:    She has no cervical adenopathy.  Neurological: She is alert and oriented to person, place, and time.  Skin: Skin is warm and dry. No rash noted. She is not diaphoretic.  Psychiatric: Memory, affect and judgment normal.    Lab Results  Component Value Date   TSH 0.080* 02/16/2012   Lab Results  Component Value Date   WBC 5.6 12/03/2012   HGB 12.1 12/03/2012   HCT 37.7 12/03/2012   MCV 100 12/03/2012   PLT 205 12/03/2012   Lab Results  Component Value Date   CREATININE 1.21* 05/07/2012   BUN 23 05/07/2012   NA 138 05/07/2012   K 4.3 05/07/2012   CL 106 05/07/2012   CO2 25 05/07/2012   Lab Results  Component Value Date   ALT 14 05/07/2012   AST 20 05/07/2012   ALKPHOS 73 05/07/2012   BILITOT 0.6 05/07/2012   Lab Results  Component Value Date   CHOL  Value: 153        ATP III CLASSIFICATION:  <200     mg/dL   Desirable  161-096  mg/dL   Borderline High  >=045    mg/dL   High        40/98/1191   Lab Results  Component Value Date   HDL 83 08/03/2009   Lab Results  Component Value Date   LDLCALC  Value: 62        Total Cholesterol/HDL:CHD Risk Coronary Heart Disease Risk Table                     Men   Women  1/2 Average Risk   3.4   3.3  Average Risk       5.0   4.4  2 X Average Risk   9.6   7.1  3 X Average Risk  23.4   11.0        Use the calculated Patient Ratio above and the CHD Risk Table to determine the patient's CHD Risk.        ATP III CLASSIFICATION (LDL):  <100     mg/dL   Optimal  478-295  mg/dL   Near or Above                    Optimal  130-159  mg/dL   Borderline  621-308  mg/dL   High  >657     mg/dL   Very High 84/69/6295   Lab Results  Component Value Date   TRIG 41 08/03/2009   Lab Results   Component Value Date   CHOLHDL 1.8 08/03/2009     Assessment & Plan  HTN (hypertension) Improved on repeat check no changes today  ATRIAL FIBRILLATION Tolerating coumadin, numbers therapeutic  BREAST PAIN, LEFT Continues with daily pain, mgm's have been stable is following with oncology, cxr unremarkable today. Try Tylenol 1gm po bid and see if that is helpful  HYPERLIPIDEMIA Tolerating Crestor, cont current dose and check lipids with next visit

## 2012-12-03 NOTE — Assessment & Plan Note (Signed)
Tolerating coumadin, numbers therapeutic

## 2012-12-03 NOTE — Assessment & Plan Note (Signed)
Improved on repeat check no changes today

## 2012-12-03 NOTE — Patient Instructions (Addendum)
RTC in 2-3 months, lipids, renal, cbc, tsh, hepatic prior to next visit

## 2012-12-04 NOTE — Progress Notes (Signed)
Quick Note:  Alesse at [facility] Informed and voiced understanding ______

## 2012-12-06 ENCOUNTER — Telehealth: Payer: Self-pay

## 2012-12-06 NOTE — Telephone Encounter (Signed)
Continue same dose of Coumadin and repeat INR next week

## 2012-12-06 NOTE — Telephone Encounter (Signed)
Rita Miller with [name] left a message stating pts PT/INR was 3.02. Please advise?

## 2012-12-07 NOTE — Telephone Encounter (Signed)
Marique informed and voiced understanding

## 2012-12-13 ENCOUNTER — Ambulatory Visit: Payer: Medicare Other | Admitting: Neurosurgery

## 2012-12-18 ENCOUNTER — Telehealth: Payer: Self-pay

## 2012-12-18 NOTE — Telephone Encounter (Signed)
Still goo, same instructions

## 2012-12-18 NOTE — Telephone Encounter (Signed)
Perfect, no change in Coumadin dose recheck PT/INR monthly.

## 2012-12-18 NOTE — Telephone Encounter (Signed)
FYI: Correction PT/INR is 2.32.  Robynn Pane at [facility] was informed of information below

## 2012-12-18 NOTE — Telephone Encounter (Signed)
Maurice at [facility] left a message stating pts PT/INR was 2.22. Please advise?

## 2012-12-20 ENCOUNTER — Encounter: Payer: Self-pay | Admitting: Vascular Surgery

## 2012-12-21 ENCOUNTER — Encounter (INDEPENDENT_AMBULATORY_CARE_PROVIDER_SITE_OTHER): Payer: Medicare Other | Admitting: *Deleted

## 2012-12-21 ENCOUNTER — Ambulatory Visit (INDEPENDENT_AMBULATORY_CARE_PROVIDER_SITE_OTHER): Payer: Medicare Other | Admitting: Vascular Surgery

## 2012-12-21 ENCOUNTER — Encounter: Payer: Self-pay | Admitting: Vascular Surgery

## 2012-12-21 VITALS — BP 120/56 | HR 70 | Resp 14 | Ht 59.0 in | Wt 100.0 lb

## 2012-12-21 DIAGNOSIS — I739 Peripheral vascular disease, unspecified: Secondary | ICD-10-CM

## 2012-12-21 DIAGNOSIS — I70219 Atherosclerosis of native arteries of extremities with intermittent claudication, unspecified extremity: Secondary | ICD-10-CM | POA: Insufficient documentation

## 2012-12-21 DIAGNOSIS — Z48812 Encounter for surgical aftercare following surgery on the circulatory system: Secondary | ICD-10-CM

## 2012-12-21 NOTE — Progress Notes (Signed)
[facility] [place]  Established Intermittent Claudication  History of Present Illness  Rita Miller is a 79 y.o. ([birth year]) female who presents with chief complaint: no complaints.  The patient is chronically wheelchair bound only transferring short distances.  She does not have intermittent claudication or rest pain symptoms.  The patient's symptoms have not progressed.  The patient's symptoms are: none at rest The patient's treatment regimen currently included: maximal medical management.  In my previous discussions with family we have agreed to limit any interventions on this patient due her multiple co-morbidities.  Past Medical History, Past Surgical History, Social History, Family History, Medications, Allergies, and Review of Systems are unchanged from previous evaluation on 06/15/12. On ROS: patient notes cold left foot and previous L leg stenting done at [facility]  Physical Examination  Filed Vitals:   12/21/12 0915  BP: 120/56  Pulse: 70  Resp: 14  Height: 4\' 11"  (1.499 m)  Weight: 100 lb (45.36 kg)  SpO2: 100%   Body mass index is 20.19 kg/(m^2).  General: A&O x 3, WDWN  Pulmonary: Sym exp, good air movt, CTAB, no rales, rhonchi, & wheezing  Cardiac: RRR, Nl S1, S2, no Murmurs, rubs or gallops  Vascular: Vessel Right Left  Radial Palpable Palpable  Ulnar Palpable Palpable  Brachial Palpable Palpable  Carotid Palpable, without bruit Palpable, without bruit  Aorta Not palpable N/A  Femoral Palpable Palpable  Popliteal Not palpable Not palpable  PT Palpable Not Palpable  DP Palpable Not Palpable   Musculoskeletal: M/S 5/5 throughout , Extremities without ischemic changes , dependent rubor in L foot (unchanged), cool L foot without any ulcers or gangrene  Neurologic: Pain and light touch intact in extremities , Motor exam as listed above  Non-Invasive Vascular Imaging ABI (Date: 12/21/12)  R: 1.02 (0.91), DP: Bi, PT: Bi, DBI: 0.74  L:  0.58 (0.49), DP: Mono, PT: Mono, DBI: 0.58  Medical Decision Making  Rita Miller is a 79 y.o. female who presents with: L leg intermittent claudication without evidence of critical limb ischemia.  Based on the patient's vascular studies and examination, I have offered the patient: annual surveillance with ABI.  I discussed in depth with the patient the nature of atherosclerosis, and emphasized the importance of maximal medical management including strict control of blood pressure, blood glucose, and lipid levels, antiplatelet agents, obtaining regular exercise, and cessation of smoking.  The patient is aware that without maximal medical management the underlying atherosclerotic disease process will progress, limiting the benefit of any interventions.  Thank you for allowing [place] to participate in this patient's care.  Rita Sake, MD [facility] [place] Office: [phone] Pager: [phone]  12/21/2012, 9:31 AM

## 2012-12-21 NOTE — Addendum Note (Signed)
Addended by: Sharee Pimple on: 12/21/2012 03:05 PM   Modules accepted: Orders

## 2013-01-02 ENCOUNTER — Telehealth: Payer: Self-pay

## 2013-01-02 NOTE — Telephone Encounter (Signed)
Maureen with [name] called stating that pts PT/INR was 2.32. Please advise?

## 2013-01-03 NOTE — Telephone Encounter (Signed)
OK no change to Coumadin dose, recheck in 1 month

## 2013-01-03 NOTE — Telephone Encounter (Signed)
Patient's nurse state that patient has lab appointment on 01/14/13.

## 2013-01-15 ENCOUNTER — Telehealth: Payer: Self-pay

## 2013-01-15 NOTE — Telephone Encounter (Signed)
INR is in therapeutic range, repeat PT/INR in one month. Continue current Coumadin doses.

## 2013-01-15 NOTE — Telephone Encounter (Signed)
PT/INR 2.43   Mon, Tues, Thurs, Fri, Sunday 3 mg  Wed and Sat 2 mg  Please advise?

## 2013-01-16 NOTE — Telephone Encounter (Signed)
Lawanna Kobus at [facility] informed

## 2013-01-18 ENCOUNTER — Telehealth: Payer: Self-pay | Admitting: Internal Medicine

## 2013-01-18 NOTE — Telephone Encounter (Signed)
01-18-13 lmm @ 1239pm to set up past due pacer check with brooke/mt

## 2013-01-24 ENCOUNTER — Telehealth: Payer: Self-pay

## 2013-01-24 MED ORDER — LEVOTHYROXINE SODIUM 100 MCG PO TABS: 100.0000 ug | ORAL_TABLET | Freq: Every day | ORAL | Status: DC

## 2013-01-24 NOTE — Telephone Encounter (Signed)
Candace with [facility] informed per verbal from MD to change Levothyroxine to 100 mcg and recheck TSH in 8-10 weeks

## 2013-01-24 NOTE — Telephone Encounter (Signed)
Rita Miller with [name] called stating that pts TSH was 13.58.  Please advise?

## 2013-01-30 ENCOUNTER — Encounter: Payer: Self-pay | Admitting: Family Medicine

## 2013-01-30 ENCOUNTER — Ambulatory Visit (INDEPENDENT_AMBULATORY_CARE_PROVIDER_SITE_OTHER): Payer: Medicare Other | Admitting: Family Medicine

## 2013-01-30 VITALS — BP 102/60 | HR 79 | Temp 97.8°F | Ht 59.0 in | Wt 104.0 lb

## 2013-01-30 DIAGNOSIS — I4891 Unspecified atrial fibrillation: Secondary | ICD-10-CM

## 2013-01-30 DIAGNOSIS — E785 Hyperlipidemia, unspecified: Secondary | ICD-10-CM

## 2013-01-30 DIAGNOSIS — E039 Hypothyroidism, unspecified: Secondary | ICD-10-CM

## 2013-01-30 DIAGNOSIS — Z853 Personal history of malignant neoplasm of breast: Secondary | ICD-10-CM

## 2013-01-30 DIAGNOSIS — I10 Essential (primary) hypertension: Secondary | ICD-10-CM

## 2013-01-30 DIAGNOSIS — F329 Major depressive disorder, single episode, unspecified: Secondary | ICD-10-CM

## 2013-01-30 NOTE — Patient Instructions (Addendum)

## 2013-02-02 NOTE — Assessment & Plan Note (Signed)
Doing well 

## 2013-02-02 NOTE — Assessment & Plan Note (Addendum)
TSH drawn at facility recently was 13.58 Levothyroxine increased to 100 mcg recheck tsh in 8-10 weeks

## 2013-02-02 NOTE — Assessment & Plan Note (Signed)
rrr today 

## 2013-02-02 NOTE — Assessment & Plan Note (Signed)
Well controlled no changes 

## 2013-02-02 NOTE — Assessment & Plan Note (Signed)
Good response to Crestor

## 2013-02-02 NOTE — Progress Notes (Signed)
Patient ID: Rita Miller, female   DOB: [birth year], 79 y.o.   MRN: [id number] Demetric Parslow [id number] [birth year] 02/02/2013      Progress Note-Follow Up  Subjective  Chief Complaint  Chief Complaint  Patient presents with  . Follow-up    2 month    HPI  Patient is an 79 year old Caucasian female in today for followup. She is doing well. Continues to complain of some mild chest discomfort on the left but her mammogram was unremarkable. No new complaints. No chest pain, palpitations, shortness of breath, fatigue, GI or GU complaints are noted. Is doing well in her facility and taking her medications as prescribed  Past Medical History  Diagnosis Date  . CAD (coronary artery disease)   . History of CVA (cerebrovascular accident)   . Atrial fibrillation     peristent  . Hyperlipemia   . COPD (chronic obstructive pulmonary disease)   . Epistaxis     recurrent  . Osteoporosis   . Hypothyroidism   . History of pulmonary embolism   . History of colonic diverticulitis   . History of breast cancer   . Asthma   . Iron deficiency anemia   . History of transfusion of whole blood   . LBBB (left bundle branch block)     history of  . Aortal stenosis     severe  . Pacemaker     bradycardia s/p pacemaker implant  . PAD (peripheral artery disease)     left leg occluded viabahn stents x 3 left SFA  . Tachycardia-bradycardia syndrome     s/p PPM  . Cancer   . Arthritis   . Stroke   . HTN (hypertension) 09/25/2012  . HTN (hypertension) 09/25/2012    Past Surgical History  Procedure Laterality Date  . Thyroidectomy      due to cancer  . Total hip arthroplasty    . Abdominal hysterectomy    . Mastectomy    . Mitral valve repair    . Intraocular lens insertion      left eye implant  . Pacemaker placement      for tachy/brady syndrome    Family History  Problem Relation Age of Onset  . Breast cancer    . Colon cancer    . Diabetes    . Hyperlipidemia    . Hypertension     . Heart disease Mother     unknown heart disease    History   Social History  . Marital Status: Widowed    Spouse Name: N/A    Number of Children: N/A  . Years of Education: N/A   Occupational History  . Not on file.   Social History Main Topics  . Smoking status: Former Games developer  . Smokeless tobacco: Never Used  . Alcohol Use: No  . Drug Use: No  . Sexually Active: Not on file   Other Topics Concern  . Not on file   Social History Narrative   Retired   Widow    1 daughter             Current Outpatient Prescriptions on File Prior to Visit  Medication Sig Dispense Refill  . acetaminophen (TYLENOL) 500 MG tablet 2 tabs po bid x 14 days then as needed for pain bid after that  30 tablet  0  . diltiazem (CARDIZEM CD) 240 MG 24 hr capsule Take 240 mg by mouth daily.       . fluticasone (FLOVENT HFA) 44 MCG/ACT inhaler  Inhale 1 puff into the lungs 2 (two) times daily.        Marland Kitchen levothyroxine (SYNTHROID, LEVOTHROID) 100 MCG tablet Take 1 tablet (100 mcg total) by mouth daily before breakfast.  30 tablet  1  . Melatonin 3 MG TABS Take 3 mg by mouth at bedtime as needed.       . memantine (NAMENDA) 10 MG tablet Take 10 mg by mouth daily.      Marland Kitchen omeprazole (PRILOSEC) 20 MG capsule 20 mg. Take one capsule daily. Take on and empty stomach- Do no crush.      . polyethylene glycol (MIRALAX / GLYCOLAX) packet Take 17 g by mouth daily. Mix 17gm in 6-8 ounces of liquid daily.       . Pseudoephedrine-DM-GG (ROBITUSSIN CF PO) Take 10 mLs by mouth every 8 (eight) hours as needed. Cough        . rivastigmine (EXELON) 9.5 mg/24hr Place 1 patch onto the skin daily.      . rosuvastatin (CRESTOR) 20 MG tablet Take 20 mg by mouth at bedtime.        Marland Kitchen SALINE NASAL SPRAY NA Place 0.65 % into the nose. One spray in each nostril twice a day for nose bleeds       . tiotropium (SPIRIVA) 18 MCG inhalation capsule Place 18 mcg into inhaler and inhale daily.        . valsartan (DIOVAN) 40 MG tablet Take  1 tablet (40 mg total) by mouth daily.  30 tablet  5  . vitamin E 400 UNIT capsule Take 400 Units by mouth daily.        Marland Kitchen warfarin (COUMADIN) 2 MG tablet Take 2 mg by mouth as directed. Jantoven- As of 12-03-12-Take 3 mg Mon,Tue, Thu, Fri and Sun and 2 mg all other days.       No current facility-administered medications on file prior to visit.    Allergies  Allergen Reactions  . Donepezil Hydrochloride     REACTION: heart fluttering  . Morphine Sulfate   . Oxycodone-Acetaminophen     REACTION: Hallucinations  . Percocet (Oxycodone-Acetaminophen)     Review of Systems  Review of Systems  Constitutional: Negative for fever and malaise/fatigue.  HENT: Negative for congestion.   Eyes: Negative for discharge.  Respiratory: Negative for shortness of breath.   Cardiovascular: Negative for chest pain, palpitations and leg swelling.  Gastrointestinal: Negative for nausea, abdominal pain and diarrhea.  Genitourinary: Negative for dysuria.  Musculoskeletal: Negative for falls.  Skin: Negative for rash.  Neurological: Negative for loss of consciousness and headaches.  Endo/Heme/Allergies: Negative for polydipsia.  Psychiatric/Behavioral: Negative for depression and suicidal ideas. The patient is not nervous/anxious and does not have insomnia.     Objective  BP 102/60  Pulse 79  Temp(Src) 97.8 F (36.6 C) (Oral)  Ht 4\' 11"  (1.499 m)  Wt 104 lb (47.174 kg)  BMI 20.99 kg/m2  SpO2 97%  Physical Exam  Physical Exam  Constitutional: She is oriented to person, place, and time and well-developed, well-nourished, and in no distress. No distress.  HENT:  Head: Normocephalic and atraumatic.  Eyes: Conjunctivae are normal.  Neck: Neck supple. No thyromegaly present.  Cardiovascular: Normal rate and normal heart sounds.   Pulmonary/Chest: Effort normal and breath sounds normal. She has no wheezes.  Abdominal: She exhibits no distension and no mass.  Musculoskeletal: She exhibits no  edema.  Lymphadenopathy:    She has no cervical adenopathy.  Neurological: She is alert and  oriented to person, place, and time.  Skin: Skin is warm and dry. No rash noted. She is not diaphoretic.  Psychiatric: Memory, affect and judgment normal.    Lab Results  Component Value Date   TSH 0.080* 02/16/2012   Lab Results  Component Value Date   WBC 5.6 12/03/2012   HGB 12.1 12/03/2012   HCT 37.7 12/03/2012   MCV 100 12/03/2012   PLT 205 12/03/2012   Lab Results  Component Value Date   CREATININE 1.23* 12/03/2012   BUN 20 12/03/2012   NA 140 12/03/2012   K 4.8 12/03/2012   CL 105 12/03/2012   CO2 25 12/03/2012   Lab Results  Component Value Date   ALT 18 12/03/2012   AST 26 12/03/2012   ALKPHOS 92 12/03/2012   BILITOT 0.6 12/03/2012   Lab Results  Component Value Date   CHOL  Value: 153        ATP III CLASSIFICATION:  <200     mg/dL   Desirable  478-295  mg/dL   Borderline High  >=621    mg/dL   High        30/86/5784   Lab Results  Component Value Date   HDL 83 08/03/2009   Lab Results  Component Value Date   LDLCALC  Value: 62        Total Cholesterol/HDL:CHD Risk Coronary Heart Disease Risk Table                     Men   Women  1/2 Average Risk   3.4   3.3  Average Risk       5.0   4.4  2 X Average Risk   9.6   7.1  3 X Average Risk  23.4   11.0        Use the calculated Patient Ratio above and the CHD Risk Table to determine the patient's CHD Risk.        ATP III CLASSIFICATION (LDL):  <100     mg/dL   Optimal  696-295  mg/dL   Near or Above                    Optimal  130-159  mg/dL   Borderline  284-132  mg/dL   High  >440     mg/dL   Very High 06/04/2535   Lab Results  Component Value Date   TRIG 41 08/03/2009   Lab Results  Component Value Date   CHOLHDL 1.8 08/03/2009     Assessment & Plan  HTN (hypertension) Well controlled no changes  BREAST CANCER, HX OF Recent mgm unremarkable.  ATRIAL FIBRILLATION rrr today  HYPERLIPIDEMIA Good response to  Crestor  HYPOTHYROIDISM TSH drawn at facility recently was 13.58 Levothyroxine increased to 100 mcg recheck tsh in 8-10 weeks  DEPRESSION Doing well

## 2013-02-02 NOTE — Assessment & Plan Note (Signed)
Recent mgm unremarkable.

## 2013-02-04 ENCOUNTER — Encounter: Payer: Self-pay | Admitting: Family Medicine

## 2013-02-13 ENCOUNTER — Telehealth: Payer: Self-pay | Admitting: Family Medicine

## 2013-02-13 ENCOUNTER — Ambulatory Visit (INDEPENDENT_AMBULATORY_CARE_PROVIDER_SITE_OTHER): Payer: Medicare Other | Admitting: General Practice

## 2013-02-13 NOTE — Telephone Encounter (Signed)
Please review Dr. Mariel Aloe notes for PT/INR plan and advise caller.Marland Kitchen

## 2013-02-13 NOTE — Telephone Encounter (Signed)
Please advise 

## 2013-02-13 NOTE — Telephone Encounter (Signed)
Angel from [facility] called with patients PT/INR results 2.62. She also wants to confirm that the next time [facility] needs to do another PT/INR is on 03/11/13?   [phone]

## 2013-02-14 NOTE — Telephone Encounter (Signed)
Rita Miller Ok to keep dosage same & recheck patient's PT/INR on Mar 11, 2013/SLS Please advise if change needed, thanks.

## 2013-02-18 NOTE — Telephone Encounter (Signed)
Please advise if this is ok.  

## 2013-02-18 NOTE — Telephone Encounter (Signed)
Yes they just need an order for monthly PT/INR results to Dr Abner Greenspan

## 2013-02-26 ENCOUNTER — Encounter: Payer: Medicare Other | Admitting: Cardiology

## 2013-03-11 ENCOUNTER — Telehealth: Payer: Self-pay | Admitting: Family Medicine

## 2013-03-11 NOTE — Telephone Encounter (Signed)
OK continue current dose of Coumadin and recheck in 1 month

## 2013-03-11 NOTE — Telephone Encounter (Signed)
Please advise 

## 2013-03-11 NOTE — Telephone Encounter (Signed)
[name] from [facility] called stating that patients PT/INR is 2.76. She is currently taking 2mg  on Wednesday and Saturday and is taking 3mg  on Monday, Thursday, Friday, and Sunday.

## 2013-03-12 ENCOUNTER — Ambulatory Visit (INDEPENDENT_AMBULATORY_CARE_PROVIDER_SITE_OTHER): Payer: Medicare Other | Admitting: Cardiology

## 2013-03-12 ENCOUNTER — Encounter: Payer: Self-pay | Admitting: Cardiology

## 2013-03-12 VITALS — BP 109/56 | HR 72 | Ht 59.0 in | Wt 103.0 lb

## 2013-03-12 DIAGNOSIS — I4891 Unspecified atrial fibrillation: Secondary | ICD-10-CM

## 2013-03-12 DIAGNOSIS — I495 Sick sinus syndrome: Secondary | ICD-10-CM

## 2013-03-12 DIAGNOSIS — Z950 Presence of cardiac pacemaker: Secondary | ICD-10-CM

## 2013-03-12 LAB — PACEMAKER DEVICE OBSERVATION
AL AMPLITUDE: 1 mv
BRDY-0002RV: 70 {beats}/min
BRDY-0004RV: 130 {beats}/min

## 2013-03-12 NOTE — Patient Instructions (Addendum)
Your physician recommends that you schedule a follow-up appointment in: DR. Jens Som   Your physician recommends that you continue on your current medications as directed. Please refer to the Current Medication list given to you today.  Your physician wants you to follow-up in: DR. Johney Frame IN 1 YEAR You will receive a reminder letter in the mail two months in advance. If you don't receive a letter, please call our office to schedule the follow-up appointment.  Your physician recommends that you schedule a follow-up appointment in: 6 MONTHS PACEMAKER CHECK IN OFFICE

## 2013-03-12 NOTE — Progress Notes (Signed)
ELECTROPHYSIOLOGY OFFICE NOTE  Patient ID: Rita Miller MRN: [id number], DOB/AGE: 1930-07-23   Date of Visit: 03/12/2013  Primary Physician: Rita Edge, MD Primary Cardiologist: Rita Som, MD / Allred, MD Reason for Visit: EP/device follow-up  History of Present Illness  Rita Miller is a 79 y.o. female with persistent atrial fibrillation, tachy-brady syndrome s/p pacemaker implant, coronary artery disease, aortic stenosis and mitral regurgitation s/p MV repair (apparently felt not to be a candidate for AVR at the time). She is being seen for Dr. Johney Miller and presents for EP/device follow-up. She is accompanied by caregiver from [facility].   Since last being seen in our clinic, she reports she is doing well and has no new complaints. She denies chest pain or shortness of breath. She denies palpitations, dizziness, near syncope or syncope. She denies LE swelling, orthopnea, PND or recent weight gain. She is compliant and tolerating medications without difficulty.  Of note, last echocardiogram was performed in Oct 2012 which showed preserved LV function, EF 55-60%, severe aortic stenosis with a mean gradient of 52 mmHg, mild aortic insufficiency, status post mitral valve repair with mild mitral stenosis and mild mitral regurgitation and mildly elevated pulmonic pressures. The left atrium was severely dilated. Rita Miller also has severe PVD and is felt not to be an operative candiate by Vasc Surgery.     Past Medical History Past Medical History  Diagnosis Date  . CAD (coronary artery disease)   . History of CVA (cerebrovascular accident)   . Atrial fibrillation     peristent  . Hyperlipemia   . COPD (chronic obstructive pulmonary disease)   . Epistaxis     recurrent  . Osteoporosis   . Hypothyroidism   . History of pulmonary embolism   . History of colonic diverticulitis   . History of breast cancer   . Asthma   . Iron deficiency anemia   . History of transfusion  of whole blood   . LBBB (left bundle branch block)     history of  . Aortal stenosis     severe  . Pacemaker     bradycardia s/p pacemaker implant  . PAD (peripheral artery disease)     left leg occluded viabahn stents x 3 left SFA  . Tachycardia-bradycardia syndrome     s/p PPM  . Cancer   . Arthritis   . Stroke   . HTN (hypertension) 09/25/2012  . HTN (hypertension) 09/25/2012    Past Surgical History Past Surgical History  Procedure Laterality Date  . Thyroidectomy      due to cancer  . Total hip arthroplasty    . Abdominal hysterectomy    . Mastectomy    . Mitral valve repair    . Intraocular lens insertion      left eye implant  . Pacemaker placement      for tachy/brady syndrome    Allergies/Intolerances Allergies  Allergen Reactions  . Donepezil Hydrochloride     REACTION: heart fluttering  . Morphine Sulfate   . Oxycodone-Acetaminophen     REACTION: Hallucinations  . Percocet (Oxycodone-Acetaminophen)    Current Home Medications Current Outpatient Prescriptions  Medication Sig Dispense Refill  . acetaminophen (TYLENOL) 500 MG tablet 2 tabs po bid x 14 days then as needed for pain bid after that  30 tablet  0  . diltiazem (CARDIZEM CD) 240 MG 24 hr capsule Take 240 mg by mouth daily.       . fluticasone (FLOVENT HFA) 44  MCG/ACT inhaler Inhale 1 puff into the lungs 2 (two) times daily.        Marland Kitchen levothyroxine (SYNTHROID, LEVOTHROID) 100 MCG tablet Take 1 tablet (100 mcg total) by mouth daily before breakfast.  30 tablet  1  . Melatonin 3 MG TABS Take 3 mg by mouth at bedtime as needed.       . memantine (NAMENDA) 10 MG tablet Take 10 mg by mouth daily.      Marland Kitchen omeprazole (PRILOSEC) 20 MG capsule 20 mg. Take one capsule daily. Take on and empty stomach- Do no crush.      . polyethylene glycol (MIRALAX / GLYCOLAX) packet Take 17 g by mouth daily. Mix 17gm in 6-8 ounces of liquid daily.       . Pseudoephedrine-DM-GG (ROBITUSSIN CF PO) Take 10 mLs by mouth every 8  (eight) hours as needed. Cough        . rivastigmine (EXELON) 9.5 mg/24hr Place 1 patch onto the skin daily.      . rosuvastatin (CRESTOR) 20 MG tablet Take 20 mg by mouth at bedtime.        Marland Kitchen SALINE NASAL SPRAY NA Place 0.65 % into the nose. One spray in each nostril twice a day for nose bleeds       . tiotropium (SPIRIVA) 18 MCG inhalation capsule Place 18 mcg into inhaler and inhale daily.        . valsartan (DIOVAN) 40 MG tablet Take 1 tablet (40 mg total) by mouth daily.  30 tablet  5  . vitamin E 400 UNIT capsule Take 400 Units by mouth daily.        Marland Kitchen warfarin (COUMADIN) 2 MG tablet Take 2 mg by mouth as directed. Jantoven- As of 12-03-12-Take 3 mg Mon,Tue, Thu, Fri and Sun and 2 mg all other days.       No current facility-administered medications for this visit.   Social History Social History  . Marital Status: Widowed   Social History Main Topics  . Smoking status: Former Games developer  . Smokeless tobacco: Never Used  . Alcohol Use: No  . Drug Use: No   Social History Narrative   Retired   Armed forces training and education officer    1 daughter       Review of Systems General: No chills, fever, night sweats or weight changes Cardiovascular: No chest pain, dyspnea on exertion, edema, orthopnea, palpitations, paroxysmal nocturnal dyspnea Dermatological: No rash, lesions or masses Respiratory: No cough, dyspnea Urologic: No hematuria, dysuria Abdominal: No nausea, vomiting, diarrhea, bright red blood per rectum, melena, or hematemesis Neurologic: No visual changes, weakness, changes in mental status All other systems reviewed and are otherwise negative except as noted above.  Physical Exam Vitals: Blood pressure 109/56, pulse 72, height 4\' 11"  (1.499 m), weight 103 lb (46.72 kg).  General: Well developed, well appearing 79 y.o. female in no acute distress. She is sitting comfortably in wheelchair. HEENT: Normocephalic, atraumatic. EOMs intact. Sclera nonicteric. Oropharynx clear.  Neck: Supple. No JVD. Lungs:  Respirations regular and unlabored, CTA bilaterally. No wheezes, rales or rhonchi. Heart: Regular. S1, S2 present. Harsh III/VI systolic murmur heard throughout. No rub, S3 or S4. Abdomen: Soft, non-distended. Extremities: No clubbing, cyanosis or edema. PT/Radials 2+ and equal bilaterally. Psych: Normal affect. Neuro: Alert and oriented X 3. Moves all extremities spontaneously.   Diagnostics Device interrogation today - Normal device function. Thresholds, sensing, impedances consistent with previous measurements. Device programmed to maximize longevity. 1790 mode switch episodes, 64.9% of time. 2 high  ventricular rates, longest 4 seconds, EGMs consistent with AF w/RVR. Device programmed at appropriate safety margins. Histogram distribution appropriate for patient activity level. Device programmed to optimize intrinsic conduction. Estimated longevity 19 months.  Assessment and Plan 1. Tachy-brady syndrome s/p PPM implant - Normal device function - No programming changes made - Continue routine PPM follow-up in device in clinic in 6 months - Return for follow-up with Dr. Johney Miller in 6 months 2. Persistent AF - Stable - Continue CCB for rate control and warfarin for embolic prophylaxis - Coumadin follow-up done at [facility] 3. Severe AS - stable without symptoms of CP, SOB, dizziness or syncope - follow-up as scheduled with Dr. Jens Miller  Signed, Nelline Lio, PA-C 03/12/2013, 11:03 AM

## 2013-03-12 NOTE — Telephone Encounter (Signed)
Rita Miller with pts facility informed and voiced understanding

## 2013-03-21 ENCOUNTER — Telehealth: Payer: Self-pay | Admitting: *Deleted

## 2013-03-21 NOTE — Telephone Encounter (Signed)
Received call from [place] at [facility] with TSH reading from 03/20/13.  Result is <0.01 and pt's current levothyroxine dose is daily.  Ask for the "middle hall nurse" when returning call.  Please advise.

## 2013-03-21 NOTE — Telephone Encounter (Signed)
Drop her Synthroid to 75 mcg po daily, recheck TSH in 8 weeks

## 2013-03-21 NOTE — Telephone Encounter (Signed)
Verbal given to [place].

## 2013-04-02 ENCOUNTER — Ambulatory Visit: Payer: Medicare Other | Admitting: Internal Medicine

## 2013-04-04 ENCOUNTER — Other Ambulatory Visit (HOSPITAL_BASED_OUTPATIENT_CLINIC_OR_DEPARTMENT_OTHER): Payer: Medicare Other | Admitting: Lab

## 2013-04-04 ENCOUNTER — Ambulatory Visit (HOSPITAL_BASED_OUTPATIENT_CLINIC_OR_DEPARTMENT_OTHER): Payer: Medicare Other | Admitting: Hematology & Oncology

## 2013-04-04 VITALS — BP 121/46 | HR 85 | Temp 98.2°F | Resp 16 | Ht 59.0 in | Wt 104.0 lb

## 2013-04-04 DIAGNOSIS — Z853 Personal history of malignant neoplasm of breast: Secondary | ICD-10-CM

## 2013-04-04 DIAGNOSIS — N6459 Other signs and symptoms in breast: Secondary | ICD-10-CM

## 2013-04-04 DIAGNOSIS — D509 Iron deficiency anemia, unspecified: Secondary | ICD-10-CM

## 2013-04-04 LAB — COMPREHENSIVE METABOLIC PANEL
ALT: 15 U/L (ref 0–35)
AST: 24 U/L (ref 0–37)
Alkaline Phosphatase: 85 U/L (ref 39–117)
BUN: 31 mg/dL — ABNORMAL HIGH (ref 6–23)
Creatinine, Ser: 1.24 mg/dL — ABNORMAL HIGH (ref 0.50–1.10)
Total Bilirubin: 0.6 mg/dL (ref 0.3–1.2)

## 2013-04-04 LAB — CBC WITH DIFFERENTIAL (CANCER CENTER ONLY)
BASO%: 0.8 % (ref 0.0–2.0)
EOS%: 3 % (ref 0.0–7.0)
HCT: 36.2 % (ref 34.8–46.6)
LYMPH#: 1.3 10*3/uL (ref 0.9–3.3)
LYMPH%: 20.2 % (ref 14.0–48.0)
MCH: 31.9 pg (ref 26.0–34.0)
MCHC: 31.8 g/dL — ABNORMAL LOW (ref 32.0–36.0)
MCV: 101 fL (ref 81–101)
MONO%: 10 % (ref 0.0–13.0)
NEUT%: 66 % (ref 39.6–80.0)
RDW: 13.5 % (ref 11.1–15.7)

## 2013-04-04 LAB — IRON AND TIBC CHCC
%SAT: 27 % (ref 21–57)
TIBC: 214 ug/dL — ABNORMAL LOW (ref 236–444)
UIBC: 157 ug/dL (ref 120–384)

## 2013-04-04 NOTE — Progress Notes (Signed)
This office note has been dictated.

## 2013-04-05 NOTE — Progress Notes (Signed)
DIAGNOSIS: 1. Recurrent iron-deficiency anemia. 2. History of stage I infiltrating ductal carcinoma of the right     breast.  CURRENT THERAPY:  IV iron as indicated.  INTERIM HISTORY:  Rita Miller comes in for followup.  Her dementia is her biggest problem.  She has really bad short-term memory loss.  She has had no obvious issues since we last saw her in April.  She has had no bleeding.  Her appetite has been okay.  She also has chronic breast pain in the left breast.  She has had no cough.  She has had no leg swelling.  She has had no fevers. When we last saw her, her ferritin was 340 with an iron saturation of 29%.  PHYSICAL EXAM:  General:  This is a petite elderly white female in no obvious distress.  Vital signs:  Temperature of 98.2, pulse 85, respiratory rate 16, blood pressure 121/46.  Weight is 104.  Head/Neck: Normocephalic, atraumatic skull.  There are no ocular or oral lesions. She has no palpable cervical or supraclavicular lymph nodes.  Lungs: Clear bilaterally.  Cardiac:  Regular rate and rhythm with a normal S1 and S2.  There are no murmurs, rubs or bruits.  Abdomen:  Soft.  She has good bowel sounds.  There is no fluid wave.  There is no palpable hepatosplenomegaly.  Breasts:  Status post right mastectomy.  No right chest wall nodules are noted.  Left breast is without erythema or warmth.  There is no swelling in the right breast.  There is some tenderness, but this is chronic.  No distinct mass noted in the left breast.  There is no left axillary adenopathy.  Back:  No kyphosis. Extremities:  No clubbing, cyanosis or edema.  LABORATORY STUDIES:  White cell count is 6.2, hemoglobin 11.5, hematocrit 36.2, platelet count 224.  IMPRESSION:  Rita Miller is a very charming 79 year old white female.  Her dementia is her biggest issue.  She is doing okay overall.  Again, she has chronic left breast pain.  We have done ultrasounds on this.  These have not shown any  obvious abnormality.  The last ultrasound was done back in February of 2013.  I do not see any indication for any additional studies.  She had a mammogram done in June of this year.  The mammogram showed  a stable left breast lesion which is felt to be benign.  There was nothing suspicious noted in the left breast.  We will plan to get her back in 4 more months.  We will get her through the holidays.    ______________________________ Josph Macho, M.D. PRE/MEDQ  D:  04/04/2013  T:  04/05/2013  Job:  [id number]

## 2013-04-11 ENCOUNTER — Telehealth: Payer: Self-pay

## 2013-04-11 NOTE — Telephone Encounter (Signed)
Aleese called from [facility] to see if MD wants to leave patients coumadin on the same dose?  PT is 18.0 INR is 1.6  Pt takes 2 mg Wed and Sat Pt takes 3 mg every other day  Please advise if patient is to stay on the same doses?

## 2013-04-11 NOTE — Telephone Encounter (Signed)
Leave same dose but recheck PT/INR in 2 weeks

## 2013-04-12 NOTE — Telephone Encounter (Signed)
Rita Miller at [facility] informed and stated they will recheck PT/INR on 04-25-13

## 2013-04-26 ENCOUNTER — Telehealth: Payer: Self-pay

## 2013-04-26 NOTE — Telephone Encounter (Signed)
Rita Pane- informed

## 2013-04-26 NOTE — Telephone Encounter (Signed)
Our med list has her on Namenda 10 mg daily which is the short acting. Is our med list wrong if so then OK to change her to Namenda 10 mg daily and then at next visit we can titrate up to 10 mg bid if needed

## 2013-04-26 NOTE — Telephone Encounter (Signed)
Please advise? PT-25  INR2.17 2 mg on Wed on Saturday 3 mg on the other days.  Namenda XR is out of stock at the facility and patient hasn't been taking for quit awhile? Rosey Bath stated that she believes they have the regular Namenda and would like to know if MD will send a new RX?

## 2013-04-26 NOTE — Telephone Encounter (Signed)
Teresa informed.

## 2013-04-26 NOTE — Telephone Encounter (Signed)
No change to coumadin, recheck PT inr in 1 month

## 2013-04-26 NOTE — Telephone Encounter (Signed)
Please advise about the Namenda?

## 2013-05-01 ENCOUNTER — Ambulatory Visit (INDEPENDENT_AMBULATORY_CARE_PROVIDER_SITE_OTHER): Payer: Medicare Other | Admitting: Family Medicine

## 2013-05-01 ENCOUNTER — Encounter: Payer: Self-pay | Admitting: Family Medicine

## 2013-05-01 VITALS — BP 118/52 | HR 73 | Temp 98.0°F | Ht 59.0 in | Wt 103.1 lb

## 2013-05-01 DIAGNOSIS — J449 Chronic obstructive pulmonary disease, unspecified: Secondary | ICD-10-CM

## 2013-05-01 DIAGNOSIS — R7309 Other abnormal glucose: Secondary | ICD-10-CM

## 2013-05-01 DIAGNOSIS — R739 Hyperglycemia, unspecified: Secondary | ICD-10-CM

## 2013-05-01 DIAGNOSIS — E039 Hypothyroidism, unspecified: Secondary | ICD-10-CM

## 2013-05-01 DIAGNOSIS — I359 Nonrheumatic aortic valve disorder, unspecified: Secondary | ICD-10-CM

## 2013-05-01 DIAGNOSIS — E785 Hyperlipidemia, unspecified: Secondary | ICD-10-CM

## 2013-05-01 DIAGNOSIS — I10 Essential (primary) hypertension: Secondary | ICD-10-CM

## 2013-05-01 LAB — RENAL FUNCTION PANEL
BUN: 25 mg/dL — ABNORMAL HIGH (ref 6–23)
Calcium: 9 mg/dL (ref 8.4–10.5)
Phosphorus: 3.7 mg/dL (ref 2.3–4.6)
Potassium: 3.9 mEq/L (ref 3.5–5.3)
Sodium: 137 mEq/L (ref 135–145)

## 2013-05-01 LAB — LIPID PANEL
HDL: 62 mg/dL (ref >39)
LDL Cholesterol: 64 mg/dL (ref 0–99)
VLDL: 9 mg/dL (ref 0–40)

## 2013-05-01 LAB — HEMOGLOBIN A1C
Hgb A1c MFr Bld: 6.1 % — ABNORMAL HIGH (ref <5.7)
Mean Plasma Glucose: 128 mg/dL — ABNORMAL HIGH (ref <117)

## 2013-05-01 NOTE — Patient Instructions (Addendum)

## 2013-05-05 ENCOUNTER — Encounter: Payer: Self-pay | Admitting: Family Medicine

## 2013-05-05 NOTE — Progress Notes (Signed)
Patient ID: Rita Miller, female   DOB: 05/17/1930, 79 y.o.   MRN: [id number] Rita Miller [id number] 03-20-1930 05/05/2013      Progress Note-Follow Up  Subjective  Chief Complaint  Chief Complaint  Patient presents with  . Follow-up    3 month    HPI  Patient is an 79 year old Caucasian female who resides in a facility. She's doing well. She continues to want to return to [place] but acknowledges that within her living situation she's doing well. She's been no recent illness. She denies any chest pain or palpitations, shortness of breath, GI or GU concerns. No complaints of chest pain, abdominal or back pain. Facility offers no concerns today.  Past Medical History  Diagnosis Date  . CAD (coronary artery disease)   . History of CVA (cerebrovascular accident)   . Atrial fibrillation     peristent  . Hyperlipemia   . COPD (chronic obstructive pulmonary disease)   . Epistaxis     recurrent  . Osteoporosis   . Hypothyroidism   . History of pulmonary embolism   . History of colonic diverticulitis   . History of breast cancer   . Asthma   . Iron deficiency anemia   . History of transfusion of whole blood   . LBBB (left bundle branch block)     history of  . Aortal stenosis     severe  . Pacemaker     bradycardia s/p pacemaker implant  . PAD (peripheral artery disease)     left leg occluded viabahn stents x 3 left SFA  . Tachycardia-bradycardia syndrome     s/p PPM  . Cancer   . Arthritis   . Stroke   . HTN (hypertension) 09/25/2012  . HTN (hypertension) 09/25/2012    Past Surgical History  Procedure Laterality Date  . Thyroidectomy      due to cancer  . Total hip arthroplasty    . Abdominal hysterectomy    . Mastectomy    . Mitral valve repair    . Intraocular lens insertion      left eye implant  . Pacemaker placement      for tachy/brady syndrome    Family History  Problem Relation Age of Onset  . Breast cancer    . Colon cancer    . Diabetes     . Hyperlipidemia    . Hypertension    . Heart disease Mother     unknown heart disease    History   Social History  . Marital Status: Widowed    Spouse Name: N/A    Number of Children: N/A  . Years of Education: N/A   Occupational History  . Not on file.   Social History Main Topics  . Smoking status: Former Games developer  . Smokeless tobacco: Never Used  . Alcohol Use: No  . Drug Use: No  . Sexual Activity: Not on file   Other Topics Concern  . Not on file   Social History Narrative   Retired   Widow    1 daughter             Current Outpatient Prescriptions on File Prior to Visit  Medication Sig Dispense Refill  . acetaminophen (TYLENOL) 500 MG tablet 2 tabs po bid x 14 days then as needed for pain bid after that  30 tablet  0  . diltiazem (CARDIZEM CD) 240 MG 24 hr capsule Take 240 mg by mouth daily.       Marland Kitchen  fluticasone (FLOVENT HFA) 44 MCG/ACT inhaler Inhale 1 puff into the lungs 2 (two) times daily.        Marland Kitchen levothyroxine (SYNTHROID, LEVOTHROID) 75 MCG tablet Take 75 mcg by mouth daily before breakfast.      . Melatonin 3 MG TABS Take 3 mg by mouth at bedtime as needed.       . memantine (NAMENDA) 10 MG tablet Take 10 mg by mouth daily.      Marland Kitchen omeprazole (PRILOSEC) 20 MG capsule 20 mg. Take one capsule daily. Take on and empty stomach- Do no crush.      . polyethylene glycol (MIRALAX / GLYCOLAX) packet Take 17 g by mouth daily. Mix 17gm in 6-8 ounces of liquid daily.       . rivastigmine (EXELON) 9.5 mg/24hr Place 1 patch onto the skin daily.      . rosuvastatin (CRESTOR) 20 MG tablet Take 20 mg by mouth at bedtime.        Marland Kitchen SALINE NASAL SPRAY NA Place 0.65 % into the nose. One spray in each nostril twice a day for nose bleeds       . tiotropium (SPIRIVA) 18 MCG inhalation capsule Place 18 mcg into inhaler and inhale daily.        . valsartan (DIOVAN) 40 MG tablet Take 1 tablet (40 mg total) by mouth daily.  30 tablet  5  . vitamin E 400 UNIT capsule Take 400 Units by  mouth daily.        Marland Kitchen warfarin (COUMADIN) 2 MG tablet Take 2 mg by mouth as directed. As directed      . Pseudoephedrine-DM-GG (ROBITUSSIN CF PO) Take 10 mLs by mouth every 8 (eight) hours as needed. Cough         No current facility-administered medications on file prior to visit.    Allergies  Allergen Reactions  . Donepezil Hydrochloride     REACTION: heart fluttering  . Morphine Sulfate   . Oxycodone-Acetaminophen     REACTION: Hallucinations  . Percocet [Oxycodone-Acetaminophen]     Review of Systems  Review of Systems  Constitutional: Negative for fever and malaise/fatigue.  HENT: Negative for congestion.   Eyes: Negative for discharge.  Respiratory: Negative for shortness of breath.   Cardiovascular: Negative for chest pain, palpitations and leg swelling.  Gastrointestinal: Negative for nausea, abdominal pain and diarrhea.  Genitourinary: Negative for dysuria.  Musculoskeletal: Negative for falls.  Skin: Negative for rash.  Neurological: Negative for loss of consciousness and headaches.  Endo/Heme/Allergies: Negative for polydipsia.  Psychiatric/Behavioral: Negative for depression and suicidal ideas. The patient is not nervous/anxious and does not have insomnia.     Objective  BP 118/52  Pulse 73  Temp(Src) 98 F (36.7 C) (Oral)  Ht 4\' 11"  (1.499 m)  Wt 103 lb 1.9 oz (46.775 kg)  BMI 20.82 kg/m2  SpO2 97%  Physical Exam  Physical Exam  Constitutional: She is oriented to person, place, and time and well-developed, well-nourished, and in no distress. No distress.  HENT:  Head: Normocephalic and atraumatic.  Eyes: Conjunctivae are normal.  Neck: Neck supple. No thyromegaly present.  Cardiovascular: Normal rate.   Murmur heard. Pulmonary/Chest: Effort normal and breath sounds normal. She has no wheezes.  Abdominal: She exhibits no distension and no mass.  Musculoskeletal: She exhibits no edema.  Lymphadenopathy:    She has no cervical adenopathy.   Neurological: She is alert and oriented to person, place, and time.  Skin: Skin is warm and dry.  No rash noted. She is not diaphoretic.  Psychiatric: Memory, affect and judgment normal.    Lab Results  Component Value Date   TSH 0.176* 05/01/2013   Lab Results  Component Value Date   WBC 6.2 04/04/2013   HGB 11.5* 04/04/2013   HCT 36.2 04/04/2013   MCV 101 04/04/2013   PLT 224 04/04/2013   Lab Results  Component Value Date   CREATININE 1.32* 05/01/2013   BUN 25* 05/01/2013   NA 137 05/01/2013   K 3.9 05/01/2013   CL 106 05/01/2013   CO2 23 05/01/2013   Lab Results  Component Value Date   ALT 15 04/04/2013   AST 24 04/04/2013   ALKPHOS 85 04/04/2013   BILITOT 0.6 04/04/2013   Lab Results  Component Value Date   CHOL 135 05/01/2013   Lab Results  Component Value Date   HDL 62 05/01/2013   Lab Results  Component Value Date   LDLCALC 64 05/01/2013   Lab Results  Component Value Date   TRIG 45 05/01/2013   Lab Results  Component Value Date   CHOLHDL 2.2 05/01/2013     Assessment & Plan  AORTIC VALVE DISORDERS Has not seen cardiology in over a year but is doing well. Does have an appt for next month.   HTN (hypertension) Well controlled, nochanges  COPD No recent flares  HYPOTHYROIDISM Over treated. Adjusted Levothyroxine today. Recheck TSH in 10 to 12 weeks  HYPERLIPIDEMIA Tolerating Crestor, adequate response

## 2013-05-05 NOTE — Assessment & Plan Note (Signed)
Tolerating Crestor, adequate response

## 2013-05-05 NOTE — Assessment & Plan Note (Signed)
Over treated. Adjusted Levothyroxine today. Recheck TSH in 10 to 12 weeks

## 2013-05-05 NOTE — Assessment & Plan Note (Signed)
Has not seen cardiology in over a year but is doing well. Does have an appt for next month.

## 2013-05-05 NOTE — Assessment & Plan Note (Signed)
No recent flares 

## 2013-05-05 NOTE — Assessment & Plan Note (Signed)
Well controlled, no changes 

## 2013-05-06 MED ORDER — LEVOTHYROXINE SODIUM 50 MCG PO TABS: 50.0000 ug | ORAL_TABLET | Freq: Every day | ORAL | Status: DC

## 2013-05-06 NOTE — Addendum Note (Signed)
Addended by: Court Joy on: 05/06/2013 10:11 AM   Modules accepted: Orders

## 2013-05-09 ENCOUNTER — Encounter: Payer: Self-pay | Admitting: Cardiology

## 2013-05-09 ENCOUNTER — Ambulatory Visit (INDEPENDENT_AMBULATORY_CARE_PROVIDER_SITE_OTHER): Payer: Medicare Other | Admitting: Cardiology

## 2013-05-09 VITALS — BP 100/68 | HR 72 | Ht 59.0 in | Wt 103.0 lb

## 2013-05-09 DIAGNOSIS — Z9889 Other specified postprocedural states: Secondary | ICD-10-CM

## 2013-05-09 DIAGNOSIS — I4891 Unspecified atrial fibrillation: Secondary | ICD-10-CM

## 2013-05-09 DIAGNOSIS — I2510 Atherosclerotic heart disease of native coronary artery without angina pectoris: Secondary | ICD-10-CM

## 2013-05-09 DIAGNOSIS — I10 Essential (primary) hypertension: Secondary | ICD-10-CM

## 2013-05-09 DIAGNOSIS — I359 Nonrheumatic aortic valve disorder, unspecified: Secondary | ICD-10-CM

## 2013-05-09 DIAGNOSIS — Z950 Presence of cardiac pacemaker: Secondary | ICD-10-CM

## 2013-05-09 DIAGNOSIS — E785 Hyperlipidemia, unspecified: Secondary | ICD-10-CM

## 2013-05-09 DIAGNOSIS — I739 Peripheral vascular disease, unspecified: Secondary | ICD-10-CM

## 2013-05-09 NOTE — Assessment & Plan Note (Signed)
Continue statin. Not on aspirin given need for Coumadin. 

## 2013-05-09 NOTE — Assessment & Plan Note (Signed)
Continue SBE prophylaxis. 

## 2013-05-09 NOTE — Patient Instructions (Addendum)
Your physician wants you to follow-up in: 6 MONTHS WITH DR Jens Som You will receive a reminder letter in the mail two months in advance. If you don't receive a letter, please call our office to schedule the follow-up appointment.   Your physician has requested that you have an echocardiogram. Echocardiography is a painless test that uses sound waves to create images of your heart. It provides your doctor with information about the size and shape of your heart and how well your heart's chambers and valves are working. This procedure takes approximately one hour. There are no restrictions for this procedure.   STOP VALSARTAN

## 2013-05-09 NOTE — Progress Notes (Signed)
HPI: Pleasant female with past medical history of atrial fibrillation, prior pacemaker, coronary artery disease, aortic stenosis, mitral regurgitation s/p MV repair (apparently felt not to be a candidate for AVR at the time) for fu. Last Myoview was performed in May of 2010 and showed normal perfusion and an ejection fraction of 53%. Last echocardiogram was performed in Oct 2012 and showed an ejection fraction of 55-60%, severe aortic stenosis with a mean gradient of 52 mm of mercury, mild aortic insufficiency, status post mitral valve repair with mild mitral stenosis and mild mitral regurgitation and mildly elevated pulmonic pressures. The left atrium was severely dilated. Patient also with severe PVD followed by vasc surgery. I last saw her in May of 2013. She presently denies dyspnea, chest pain or syncope.   Current Outpatient Prescriptions  Medication Sig Dispense Refill  . acetaminophen (TYLENOL) 500 MG tablet 2 tabs po bid x 14 days then as needed for pain bid after that  30 tablet  0  . diltiazem (CARDIZEM CD) 240 MG 24 hr capsule Take 240 mg by mouth daily.       . fluticasone (FLOVENT HFA) 44 MCG/ACT inhaler Inhale 1 puff into the lungs 2 (two) times daily.        Marland Kitchen levothyroxine (SYNTHROID, LEVOTHROID) 50 MCG tablet Take 1 tablet (50 mcg total) by mouth daily before breakfast.  30 tablet  1  . Melatonin 3 MG TABS Take 3 mg by mouth at bedtime as needed.       . memantine (NAMENDA) 10 MG tablet Take 10 mg by mouth daily.      Marland Kitchen omeprazole (PRILOSEC) 20 MG capsule 20 mg. Take one capsule daily. Take on and empty stomach- Do no crush.      . polyethylene glycol (MIRALAX / GLYCOLAX) packet Take 17 g by mouth daily. Mix 17gm in 6-8 ounces of liquid daily.       . Pseudoephedrine-DM-GG (ROBITUSSIN CF PO) Take 10 mLs by mouth every 8 (eight) hours as needed. Cough        . rivastigmine (EXELON) 9.5 mg/24hr Place 1 patch onto the skin daily.      . rosuvastatin (CRESTOR) 20 MG tablet  Take 20 mg by mouth at bedtime.        Marland Kitchen SALINE NASAL SPRAY NA Place 0.65 % into the nose. One spray in each nostril twice a day for nose bleeds       . tiotropium (SPIRIVA) 18 MCG inhalation capsule Place 18 mcg into inhaler and inhale daily.        . valsartan (DIOVAN) 40 MG tablet Take 1 tablet (40 mg total) by mouth daily.  30 tablet  5  . vitamin E 400 UNIT capsule Take 400 Units by mouth daily.        Marland Kitchen warfarin (COUMADIN) 2 MG tablet Take 2 mg by mouth as directed. As directed       No current facility-administered medications for this visit.     Past Medical History  Diagnosis Date  . CAD (coronary artery disease)   . History of CVA (cerebrovascular accident)   . Atrial fibrillation     peristent  . Hyperlipemia   . COPD (chronic obstructive pulmonary disease)   . Epistaxis     recurrent  . Osteoporosis   . Hypothyroidism   . History of pulmonary embolism   . History of colonic diverticulitis   . History of breast cancer   . Asthma   .  Iron deficiency anemia   . History of transfusion of whole blood   . LBBB (left bundle branch block)     history of  . Aortal stenosis     severe  . Pacemaker     bradycardia s/p pacemaker implant  . PAD (peripheral artery disease)     left leg occluded viabahn stents x 3 left SFA  . Tachycardia-bradycardia syndrome     s/p PPM  . Cancer   . Arthritis   . Stroke   . HTN (hypertension) 09/25/2012  . HTN (hypertension) 09/25/2012    Past Surgical History  Procedure Laterality Date  . Thyroidectomy      due to cancer  . Total hip arthroplasty    . Abdominal hysterectomy    . Mastectomy    . Mitral valve repair    . Intraocular lens insertion      left eye implant  . Pacemaker placement      for tachy/brady syndrome    History   Social History  . Marital Status: Widowed    Spouse Name: N/A    Number of Children: N/A  . Years of Education: N/A   Occupational History  . Not on file.   Social History Main Topics  .  Smoking status: Former Games developer  . Smokeless tobacco: Never Used  . Alcohol Use: No  . Drug Use: No  . Sexual Activity: Not on file   Other Topics Concern  . Not on file   Social History Narrative   Retired   Widow    1 daughter             ROS: no fevers or chills, productive cough, hemoptysis, dysphasia, odynophagia, melena, hematochezia, dysuria, hematuria, rash, seizure activity, orthopnea, PND, pedal edema, claudication. Remaining systems are negative.  Physical Exam: Well-developed frail in no acute distress.  Skin is warm and dry.  HEENT is normal.  Neck is supple.  Chest is clear to auscultation with normal expansion.  Cardiovascular exam is regular rate and rhythm. 3/6 systolic murmur left sternal border. S2 is diminished. Abdominal exam nontender or distended. No masses palpated. Extremities show no edema. neuro grossly intact  ECG ventricular pacing with underlying atrial fibrillation.

## 2013-05-09 NOTE — Assessment & Plan Note (Signed)
Patient has known severe aortic stenosis. Plan repeat echocardiogram. She is not having dyspnea, chest pain or syncope. Not clear that she would be a good candidate for aortic valve replacement. She also states she would not consider this. She understands this will progress.

## 2013-05-09 NOTE — Assessment & Plan Note (Signed)
Followed by vascular surgery. 

## 2013-05-09 NOTE — Assessment & Plan Note (Signed)
Blood pressure is borderline. Discontinue Diovan particularly given severe aortic stenosis.

## 2013-05-09 NOTE — Assessment & Plan Note (Signed)
Followed by electrophysiology. 

## 2013-05-09 NOTE — Assessment & Plan Note (Signed)
Continue statin. 

## 2013-05-09 NOTE — Assessment & Plan Note (Signed)
Patient remains in atrial fibrillation. Continue calcium blocker. Continue Coumadin which is monitored at her nursing home.

## 2013-05-14 ENCOUNTER — Encounter: Payer: Self-pay | Admitting: Internal Medicine

## 2013-05-23 ENCOUNTER — Telehealth: Payer: Self-pay

## 2013-05-23 NOTE — Telephone Encounter (Signed)
OK to try again Monday, she has been stable

## 2013-05-23 NOTE — Telephone Encounter (Signed)
Rita Miller w/[name] left a message stating that they were unsuccessful with getting patients PT/INR today. They can try again on Monday or if MD wants this sooner they will need order for [facility] signed by MD?  Please advise?

## 2013-05-27 ENCOUNTER — Ambulatory Visit (HOSPITAL_COMMUNITY): Payer: Medicare Other | Attending: Cardiology | Admitting: Radiology

## 2013-05-27 ENCOUNTER — Telehealth: Payer: Self-pay

## 2013-05-27 DIAGNOSIS — I079 Rheumatic tricuspid valve disease, unspecified: Secondary | ICD-10-CM | POA: Insufficient documentation

## 2013-05-27 DIAGNOSIS — I359 Nonrheumatic aortic valve disorder, unspecified: Secondary | ICD-10-CM | POA: Insufficient documentation

## 2013-05-27 DIAGNOSIS — I379 Nonrheumatic pulmonary valve disorder, unspecified: Secondary | ICD-10-CM | POA: Insufficient documentation

## 2013-05-27 DIAGNOSIS — I059 Rheumatic mitral valve disease, unspecified: Secondary | ICD-10-CM

## 2013-05-27 DIAGNOSIS — I4891 Unspecified atrial fibrillation: Secondary | ICD-10-CM

## 2013-05-27 NOTE — Telephone Encounter (Signed)
I informed [name].

## 2013-05-27 NOTE — Telephone Encounter (Signed)
She is generally stable with her INR, I would have them hold one dose of Coumadin today if not taken if already taken then tomorrow then repeat PT/INR 24 hours after last dose and let us know result so we can advise further.

## 2013-05-27 NOTE — Telephone Encounter (Signed)
Kristen w/[facility] left a message stating that pts PT was 41.8 and INR 3.52.  Please advise?

## 2013-05-27 NOTE — Progress Notes (Signed)
Echocardiogram performed.  

## 2013-05-28 NOTE — Telephone Encounter (Signed)
Informed Kristen at [facility] and she voiced understanding

## 2013-05-30 LAB — PROTIME-INR: INR: 2.6 — AB (ref 0.9–1.1)

## 2013-06-03 ENCOUNTER — Telehealth: Payer: Self-pay

## 2013-06-03 NOTE — Telephone Encounter (Signed)
Perfect, I have seen the paper copy of that and sent it back already. Continue current dose of Coumadin and recheck PT/INR in 1 month

## 2013-06-03 NOTE — Telephone Encounter (Signed)
[name] w/ [facility] called and left a message stating pts PT was 29.9 and INR was 2.57 on the 23rd. Please advise?

## 2013-06-03 NOTE — Telephone Encounter (Signed)
[name] at [facility] called and left a message stating that one of her nurses called and left a message with patients PT/INR results last Thursday and we have not responded.  I called and informed [name] that I was sorry but we don't have any notes in the system since the 20th or any missed messages from there office. [name] stated that the nurse noted that she called and left a message. I then stated again that we don't have anything. [name] stated she would look into it and fax the results to [place].

## 2013-06-04 NOTE — Telephone Encounter (Signed)
Rita Miller was informed and she voiced understanding

## 2013-06-19 ENCOUNTER — Encounter: Payer: Self-pay | Admitting: Family Medicine

## 2013-06-20 ENCOUNTER — Telehealth: Payer: Self-pay

## 2013-06-20 NOTE — Telephone Encounter (Signed)
Spoke with Debbie patients nurse and informed her of patients lab results and that labs should be rechecked in one month.

## 2013-06-20 NOTE — Telephone Encounter (Signed)
Message copied by Eulis Manly on Thu Jun 20, 2013  9:42 AM ------      Message from: Danise Edge A      Created: Wed Jun 19, 2013 10:08 PM       Notify patient's facility that her INR is between 2 and 3, no changes, recheck in 1 month ------

## 2013-07-01 ENCOUNTER — Telehealth: Payer: Self-pay

## 2013-07-01 NOTE — Telephone Encounter (Signed)
Notify INR up slightly to 3.19, hold next dose of coumadin then restart at normal dose recehck in 2-3 weeks

## 2013-07-01 NOTE — Telephone Encounter (Signed)
Patients PT/INR results PT: 37.6 INR: 31.9

## 2013-07-02 NOTE — Telephone Encounter (Signed)
Louise at [facility] advise and voiced understanding. Sallye Ober stated she would recheck in 3 weeks

## 2013-07-22 ENCOUNTER — Telehealth: Payer: Self-pay | Admitting: Family Medicine

## 2013-07-22 NOTE — Telephone Encounter (Signed)
Continue current dose of Coumadin and recheck INR in 2 weeks

## 2013-07-22 NOTE — Telephone Encounter (Signed)
Please advise 

## 2013-07-22 NOTE — Telephone Encounter (Signed)
INR 1.6, request orders to continue or recheck

## 2013-07-23 NOTE — Telephone Encounter (Signed)
Rita Miller w/ [facility] informed

## 2013-07-23 NOTE — Telephone Encounter (Signed)
LM with receptionist to have nurse return my call

## 2013-08-02 ENCOUNTER — Encounter: Payer: Self-pay | Admitting: Family Medicine

## 2013-08-02 ENCOUNTER — Ambulatory Visit (INDEPENDENT_AMBULATORY_CARE_PROVIDER_SITE_OTHER): Payer: Medicare Other | Admitting: Family Medicine

## 2013-08-02 VITALS — BP 140/70 | HR 63 | Temp 97.7°F | Ht 59.0 in | Wt 105.0 lb

## 2013-08-02 DIAGNOSIS — I4891 Unspecified atrial fibrillation: Secondary | ICD-10-CM

## 2013-08-02 DIAGNOSIS — I10 Essential (primary) hypertension: Secondary | ICD-10-CM

## 2013-08-02 DIAGNOSIS — J449 Chronic obstructive pulmonary disease, unspecified: Secondary | ICD-10-CM

## 2013-08-02 DIAGNOSIS — E039 Hypothyroidism, unspecified: Secondary | ICD-10-CM

## 2013-08-02 MED ORDER — LEVOTHYROXINE SODIUM 75 MCG PO TABS: 75.0000 ug | ORAL_TABLET | Freq: Every day | ORAL | Status: DC

## 2013-08-02 NOTE — Progress Notes (Signed)
Pre visit review using our clinic review tool, if applicable. No additional management support is needed unless otherwise documented below in the visit note. 

## 2013-08-02 NOTE — Patient Instructions (Signed)
Needs TSH checked in 10 weeks  Hypothyroidism The thyroid is a large gland located in the lower front of your neck. The thyroid gland helps control metabolism. Metabolism is how your body handles food. It controls metabolism with the hormone thyroxine. When this gland is underactive (hypothyroid), it produces too little hormone.  CAUSES These include:   Absence or destruction of thyroid tissue.  Goiter due to iodine deficiency.  Goiter due to medications.  Congenital defects (since birth).  Problems with the pituitary. This causes a lack of TSH (thyroid stimulating hormone). This hormone tells the thyroid to turn out more hormone. SYMPTOMS  Lethargy (feeling as though you have no energy)  Cold intolerance  Weight gain (in spite of normal food intake)  Dry skin  Coarse hair  Menstrual irregularity (if severe, may lead to infertility)  Slowing of thought processes Cardiac problems are also caused by insufficient amounts of thyroid hormone. Hypothyroidism in the newborn is cretinism, and is an extreme form. It is important that this form be treated adequately and immediately or it will lead rapidly to retarded physical and mental development. DIAGNOSIS  To prove hypothyroidism, your caregiver may do blood tests and ultrasound tests. Sometimes the signs are hidden. It may be necessary for your caregiver to watch this illness with blood tests either before or after diagnosis and treatment. TREATMENT  Low levels of thyroid hormone are increased by using synthetic thyroid hormone. This is a safe, effective treatment. It usually takes about four weeks to gain the full effects of the medication. After you have the full effect of the medication, it will generally take another four weeks for problems to leave. Your caregiver may start you on low doses. If you have had heart problems the dose may be gradually increased. It is generally not an emergency to get rapidly to normal. HOME CARE  INSTRUCTIONS   Take your medications as your caregiver suggests. Let your caregiver know of any medications you are taking or start taking. Your caregiver will help you with dosage schedules.  As your condition improves, your dosage needs may increase. It will be necessary to have continuing blood tests as suggested by your caregiver.  Report all suspected medication side effects to your caregiver. SEEK MEDICAL CARE IF: Seek medical care if you develop:  Sweating.  Tremulousness (tremors).  Anxiety.  Rapid weight loss.  Heat intolerance.  Emotional swings.  Diarrhea.  Weakness. SEEK IMMEDIATE MEDICAL CARE IF:  You develop chest pain, an irregular heart beat (palpitations), or a rapid heart beat. MAKE SURE YOU:   Understand these instructions.  Will watch your condition.  Will get help right away if you are not doing well or get worse. Document Released: 07/25/2005 Document Revised: 10/17/2011 Document Reviewed: 03/14/2008 [facility] Patient Information 2014 [place], [place].

## 2013-08-04 ENCOUNTER — Encounter: Payer: Self-pay | Admitting: Family Medicine

## 2013-08-04 NOTE — Assessment & Plan Note (Signed)
TSH from facility elevated, will increase Levothyroxine to 75 mcg daily

## 2013-08-04 NOTE — Assessment & Plan Note (Signed)
Well controlled, no changes 

## 2013-08-04 NOTE — Assessment & Plan Note (Signed)
Doing well, no recent flares 

## 2013-08-04 NOTE — Assessment & Plan Note (Signed)
Well controlled 

## 2013-08-04 NOTE — Progress Notes (Signed)
Patient ID: Rita Miller, female   DOB: 01-21-1930, 79 y.o.   MRN: [id number] [name] [id number] [birth year] 08/04/2013      Progress Note-Follow Up  Subjective  Chief Complaint  Chief Complaint  Patient presents with  . Follow-up    HPI  Patient is an 79 year old Caucasian female who is in today brought in by her facility. She's here for routine followup. She feels well. She does note she recently had flulike symptoms but they have resolved. She denies headaches, chest pain, palpitations shortness or breath, GI or GU concerns. Is taking medications as prescribed.  Past Medical History  Diagnosis Date  . CAD (coronary artery disease)   . History of CVA (cerebrovascular accident)   . Atrial fibrillation     peristent  . Hyperlipemia   . COPD (chronic obstructive pulmonary disease)   . Epistaxis     recurrent  . Osteoporosis   . Hypothyroidism   . History of pulmonary embolism   . History of colonic diverticulitis   . History of breast cancer   . Asthma   . Iron deficiency anemia   . History of transfusion of whole blood   . LBBB (left bundle branch block)     history of  . Aortal stenosis     severe  . Pacemaker     bradycardia s/p pacemaker implant  . PAD (peripheral artery disease)     left leg occluded viabahn stents x 3 left SFA  . Tachycardia-bradycardia syndrome     s/p PPM  . Cancer   . Arthritis   . Stroke   . HTN (hypertension) 09/25/2012  . HTN (hypertension) 09/25/2012    Past Surgical History  Procedure Laterality Date  . Thyroidectomy      due to cancer  . Total hip arthroplasty    . Abdominal hysterectomy    . Mastectomy    . Mitral valve repair    . Intraocular lens insertion      left eye implant  . Pacemaker placement      for tachy/brady syndrome    Family History  Problem Relation Age of Onset  . Breast cancer    . Colon cancer    . Diabetes    . Hyperlipidemia    . Hypertension    . Heart disease Mother     unknown heart  disease    History   Social History  . Marital Status: Widowed    Spouse Name: N/A    Number of Children: N/A  . Years of Education: N/A   Occupational History  . Not on file.   Social History Main Topics  . Smoking status: Former Games developer  . Smokeless tobacco: Never Used  . Alcohol Use: No  . Drug Use: No  . Sexual Activity: Not on file   Other Topics Concern  . Not on file   Social History Narrative   Retired   Widow    1 daughter             Current Outpatient Prescriptions on File Prior to Visit  Medication Sig Dispense Refill  . diltiazem (CARDIZEM CD) 240 MG 24 hr capsule Take 240 mg by mouth daily.       . fluticasone (FLOVENT HFA) 44 MCG/ACT inhaler Inhale 1 puff into the lungs 2 (two) times daily.        . Melatonin 3 MG TABS Take 3 mg by mouth at bedtime as needed.       Marland Kitchen  memantine (NAMENDA) 10 MG tablet Take 10 mg by mouth daily.      Marland Kitchen omeprazole (PRILOSEC) 20 MG capsule 20 mg. Take one capsule daily. Take on and empty stomach- Do no crush.      . polyethylene glycol (MIRALAX / GLYCOLAX) packet Take 17 g by mouth daily. Mix 17gm in 6-8 ounces of liquid daily.       . rivastigmine (EXELON) 9.5 mg/24hr Place 1 patch onto the skin daily.      . rosuvastatin (CRESTOR) 20 MG tablet Take 20 mg by mouth at bedtime.        Marland Kitchen SALINE NASAL SPRAY NA Place 0.65 % into the nose. One spray in each nostril twice a day for nose bleeds       . tiotropium (SPIRIVA) 18 MCG inhalation capsule Place 18 mcg into inhaler and inhale daily.        . vitamin E 400 UNIT capsule Take 400 Units by mouth daily.        Marland Kitchen warfarin (COUMADIN) 2 MG tablet Take 2 mg by mouth as directed. As directed 2 mg on Wed and Sat 3 mg Mon, Tues, Thurs, Fri and Sun      . acetaminophen (TYLENOL) 500 MG tablet 2 tabs po bid x 14 days then as needed for pain bid after that  30 tablet  0   No current facility-administered medications on file prior to visit.    Allergies  Allergen Reactions  .  Donepezil Hydrochloride     REACTION: heart fluttering  . Morphine Sulfate   . Oxycodone-Acetaminophen     REACTION: Hallucinations  . Percocet [Oxycodone-Acetaminophen]     Review of Systems  Review of Systems  Constitutional: Negative for fever and malaise/fatigue.  HENT: Negative for congestion.   Eyes: Negative for discharge.  Respiratory: Negative for shortness of breath.   Cardiovascular: Negative for chest pain, palpitations and leg swelling.  Gastrointestinal: Negative for nausea, abdominal pain and diarrhea.  Genitourinary: Negative for dysuria.  Musculoskeletal: Negative for falls.  Skin: Negative for rash.  Neurological: Negative for loss of consciousness and headaches.  Endo/Heme/Allergies: Negative for polydipsia.  Psychiatric/Behavioral: Negative for depression and suicidal ideas. The patient is not nervous/anxious and does not have insomnia.     Objective  BP 140/70  Pulse 63  Temp(Src) 97.7 F (36.5 C) (Oral)  Ht 4\' 11"  (1.499 m)  Wt 105 lb 0.6 oz (47.646 kg)  BMI 21.20 kg/m2  SpO2 95%  Physical Exam  Physical Exam  Constitutional: She is oriented to person, place, and time and well-developed, well-nourished, and in no distress. No distress.  HENT:  Head: Normocephalic and atraumatic.  Eyes: Conjunctivae are normal.  Neck: Neck supple. No thyromegaly present.  Cardiovascular: Normal rate, regular rhythm and normal heart sounds.   No murmur heard. Pulmonary/Chest: Effort normal and breath sounds normal. She has no wheezes.  Abdominal: She exhibits no distension and no mass.  Musculoskeletal: She exhibits no edema.  Lymphadenopathy:    She has no cervical adenopathy.  Neurological: She is alert and oriented to person, place, and time.  Skin: Skin is warm and dry. No rash noted. She is not diaphoretic.  Psychiatric: Memory, affect and judgment normal.    Lab Results  Component Value Date   TSH 0.176* 05/01/2013   Lab Results  Component Value  Date   WBC 6.2 04/04/2013   HGB 11.5* 04/04/2013   HCT 36.2 04/04/2013   MCV 101 04/04/2013   PLT 224 04/04/2013  Lab Results  Component Value Date   CREATININE 1.32* 05/01/2013   BUN 25* 05/01/2013   NA 137 05/01/2013   K 3.9 05/01/2013   CL 106 05/01/2013   CO2 23 05/01/2013   Lab Results  Component Value Date   ALT 15 04/04/2013   AST 24 04/04/2013   ALKPHOS 85 04/04/2013   BILITOT 0.6 04/04/2013   Lab Results  Component Value Date   CHOL 135 05/01/2013   Lab Results  Component Value Date   HDL 62 05/01/2013   Lab Results  Component Value Date   LDLCALC 64 05/01/2013   Lab Results  Component Value Date   TRIG 45 05/01/2013   Lab Results  Component Value Date   CHOLHDL 2.2 05/01/2013     Assessment & Plan  ATRIAL FIBRILLATION Well controlled  HTN (hypertension) Well controlled, no changes  HYPOTHYROIDISM TSH from facility elevated, will increase Levothyroxine to 75 mcg daily  COPD Doing well, no recent flares

## 2013-08-05 ENCOUNTER — Ambulatory Visit: Payer: Medicare Other | Admitting: Family Medicine

## 2013-08-06 ENCOUNTER — Ambulatory Visit: Payer: Medicare Other | Admitting: Family Medicine

## 2013-08-12 ENCOUNTER — Other Ambulatory Visit: Payer: Medicare Other | Admitting: Lab

## 2013-08-12 ENCOUNTER — Ambulatory Visit: Payer: Medicare Other | Admitting: Hematology & Oncology

## 2013-08-12 ENCOUNTER — Telehealth: Payer: Self-pay | Admitting: Family Medicine

## 2013-08-12 ENCOUNTER — Telehealth: Payer: Self-pay | Admitting: Hematology & Oncology

## 2013-08-12 NOTE — Telephone Encounter (Signed)
Please advise 

## 2013-08-12 NOTE — Telephone Encounter (Signed)
Lattie Haw from [facility] called stating that INR was checked on 08/05/13 and was 2.03. She states that patient is currently taking coumedin 3mg  on Monday, Tuesday, Thursday, Friday, and Sunday. 2mg  on Wednesday and Saturday.   [facility] needs an order to continue and needs to know when to draw INR again.   Please call Tonya at [phone]

## 2013-08-12 NOTE — Telephone Encounter (Signed)
Continue current dose of coumadin and recheck INR in 1 month

## 2013-08-12 NOTE — Telephone Encounter (Signed)
Talked with Rita Miller at nursing home she rescheduled 1-5 no show to 1-23. Pt's daughter Rita Miller aware also

## 2013-08-13 NOTE — Telephone Encounter (Signed)
Tonya informed and voiced understanding

## 2013-08-30 ENCOUNTER — Ambulatory Visit (HOSPITAL_BASED_OUTPATIENT_CLINIC_OR_DEPARTMENT_OTHER): Payer: Medicare Other | Admitting: Hematology & Oncology

## 2013-08-30 ENCOUNTER — Other Ambulatory Visit (HOSPITAL_BASED_OUTPATIENT_CLINIC_OR_DEPARTMENT_OTHER): Payer: Medicare Other | Admitting: Lab

## 2013-08-30 ENCOUNTER — Encounter: Payer: Self-pay | Admitting: Hematology & Oncology

## 2013-08-30 VITALS — BP 137/59 | HR 105 | Temp 97.6°F | Resp 14 | Ht 62.0 in | Wt 105.0 lb

## 2013-08-30 DIAGNOSIS — D509 Iron deficiency anemia, unspecified: Secondary | ICD-10-CM

## 2013-08-30 LAB — CBC WITH DIFFERENTIAL (CANCER CENTER ONLY)
BASO#: 0 10*3/uL (ref 0.0–0.2)
BASO%: 0.5 % (ref 0.0–2.0)
EOS%: 1.7 % (ref 0.0–7.0)
Eosinophils Absolute: 0.1 10*3/uL (ref 0.0–0.5)
HCT: 35.9 % (ref 34.8–46.6)
HGB: 11.2 g/dL — ABNORMAL LOW (ref 11.6–15.9)
LYMPH#: 1 10*3/uL (ref 0.9–3.3)
LYMPH%: 14.3 % (ref 14.0–48.0)
MCH: 30.8 pg (ref 26.0–34.0)
MCHC: 31.2 g/dL — ABNORMAL LOW (ref 32.0–36.0)
MCV: 99 fL (ref 81–101)
MONO#: 0.7 10*3/uL (ref 0.1–0.9)
MONO%: 10.7 % (ref 0.0–13.0)
NEUT%: 72.8 % (ref 39.6–80.0)
NEUTROS ABS: 4.8 10*3/uL (ref 1.5–6.5)
PLATELETS: 233 10*3/uL (ref 145–400)
RBC: 3.64 10*6/uL — ABNORMAL LOW (ref 3.70–5.32)
RDW: 14.9 % (ref 11.1–15.7)
WBC: 6.6 10*3/uL (ref 3.9–10.0)

## 2013-08-30 LAB — IRON AND TIBC CHCC
%SAT: 20 % — ABNORMAL LOW (ref 21–57)
IRON: 44 ug/dL (ref 41–142)
TIBC: 224 ug/dL — ABNORMAL LOW (ref 236–444)
UIBC: 180 ug/dL (ref 120–384)

## 2013-08-30 LAB — FERRITIN CHCC: Ferritin: 286 ng/ml — ABNORMAL HIGH (ref 9–269)

## 2013-09-01 NOTE — Progress Notes (Signed)
This office note has been dictated.

## 2013-09-02 NOTE — Progress Notes (Signed)
DIAGNOSES: 1. Recurrent iron-deficiency anemia. 2. History of stage I ductal carcinoma of the right breast. 3. Chronic left breast pain.  CURRENT THERAPY:  IV iron as indicated.  INTERIM HISTORY:  Ms. Alonge comes in for followup.  She has pretty bad dementia.  She is at a nursing home.  She is doing okay at the nursing home.  There has been no complications that she has had at the nursing home.  She has had no problems with bleeding or bruising.  She is eating okay. She has had no problems with cough.  She has had no fever.  She has had no nausea or vomiting.  When we last saw her back in August of last year, her iron studies showed a ferritin of 360 with an iron saturation of 27%.  PHYSICAL EXAMINATION:  General:  This is a petite, elderly white female in no obvious distress.  Vital Signs:  Temperature of 97.6, pulse 76, respiratory rate 14, blood pressure 137/59, weight is 105 pounds.  Head and Neck:  Normocephalic, atraumatic skull.  There are no ocular or oral lesions.  There are no palpable cervical or supraclavicular lymph nodes. Lungs:  Clear bilaterally.  Cardiac:  Regular rate and rhythm with a normal S1 and S2.  There are no murmurs, rubs, or bruits.  Breasts: Shows  right chest wall with no masses, edema, or erythema.  She has well-healed mastectomy on the right chest wall.  Left breast shows no obvious tenderness to palpation.  She does have a little bit of __________ under the left breast.  There is no redness of the left breast.  There is no nipple discharge.  There is no left axillary adenopathy.  Abdomen:  Soft.  She has good bowel sounds.  There is no fluid wave.  There is no palpable hepatosplenomegaly.  Back:  No tenderness over the spine, ribs, or hips.  Extremities:  Show no clubbing, cyanosis, or edema.  Neurological:  Shows no focal neurological deficits.  LABORATORY STUDIES:  White cell count is 6.6, hemoglobin 11.2, hematocrit 35.9, platelet count is  233.  MCV is 99.  Ferritin is 286 with an iron saturation of 20%.  IMPRESSION:  Ms. Foell is a nice 81 year old white female.  She is doing pretty well right now.  Her iron studies showed that her iron is going down.  However, she is asymptomatic.  It can be very tough to get her back and report to the clinic.  I think we can probably hold off on doing any iron right now.  We will plan to get her back in 6 months' time.  I suspect that at that point, she probably will need some iron.    ______________________________ Volanda Napoleon, M.D. PRE/MEDQ  D:  09/01/2013  T:  09/02/2013  Job:  [id number]

## 2013-09-04 ENCOUNTER — Telehealth: Payer: Self-pay | Admitting: Family Medicine

## 2013-09-04 NOTE — Telephone Encounter (Signed)
Relevant patient education mailed to patient.  

## 2013-09-05 LAB — PROTIME-INR

## 2013-09-09 ENCOUNTER — Telehealth: Payer: Self-pay

## 2013-09-09 NOTE — Telephone Encounter (Signed)
Lattie Haw with [facility] left a message stating that [facility] had called twice last week to give PT/INR results to Dr Charlett Blake and had not heard anything?  I looked in the chart and the last call I see from there was in Dec. 2014.  However [facility] [place] confirmed with [facility] that the paperwork was faxed there this morning with the order to continue coumadin dosages and recheck in 1 month.   I tried to contact [facility] but the number is busy

## 2013-09-09 NOTE — Telephone Encounter (Signed)
Opened wrong encounter

## 2013-09-16 ENCOUNTER — Other Ambulatory Visit: Payer: Medicare Other | Admitting: Lab

## 2013-09-16 ENCOUNTER — Ambulatory Visit: Payer: Medicare Other | Admitting: Hematology & Oncology

## 2013-09-18 ENCOUNTER — Ambulatory Visit (INDEPENDENT_AMBULATORY_CARE_PROVIDER_SITE_OTHER): Payer: Medicare Other | Admitting: *Deleted

## 2013-09-18 DIAGNOSIS — Z950 Presence of cardiac pacemaker: Secondary | ICD-10-CM

## 2013-09-18 DIAGNOSIS — I4891 Unspecified atrial fibrillation: Secondary | ICD-10-CM

## 2013-09-18 LAB — MDC_IDC_ENUM_SESS_TYPE_INCLINIC
Battery Impedance: 0.755
Battery Impedance: 2546 Ohm
Battery Remaining Longevity: 15 mo
Battery Voltage: 2.72 V
Battery Voltage: 2.77 V
Brady Statistic AP VP Percent: 88 %
Brady Statistic AP VS Percent: 1 %
Brady Statistic RA Percent Paced: 56 %
Brady Statistic RV Percent Paced: 71 %
Lead Channel Impedance Value: 2028 Ohm
Lead Channel Impedance Value: 867 Ohm
Lead Channel Pacing Threshold Amplitude: 0.75 V
Lead Channel Pacing Threshold Amplitude: 1 V
Lead Channel Pacing Threshold Pulse Width: 0.4 ms
Lead Channel Pacing Threshold Pulse Width: 0.4 ms
Lead Channel Sensing Intrinsic Amplitude: 1 mV
Lead Channel Sensing Intrinsic Amplitude: 1.4 mV
Lead Channel Sensing Intrinsic Amplitude: 5.6 mV
Lead Channel Setting Pacing Amplitude: 2.5 V
Lead Channel Setting Pacing Amplitude: 2.5 V
Lead Channel Setting Pacing Amplitude: 2.5 V
Lead Channel Setting Pacing Amplitude: 2.75 V
Lead Channel Setting Pacing Pulse Width: 0.4 ms
Lead Channel Setting Sensing Sensitivity: 2 mV
Lead Channel Setting Sensing Sensitivity: 2 mV
MDC IDC MSMT LEADCHNL RA IMPEDANCE VALUE: 831 Ohm
MDC IDC MSMT LEADCHNL RV IMPEDANCE VALUE: 2009 Ohm
MDC IDC MSMT LEADCHNL RV SENSING INTR AMPL: 5.6 mV
MDC IDC SESS DTM: 20150211102219
MDC IDC SET LEADCHNL RV PACING PULSEWIDTH: 0.4 ms
MDC IDC STAT BRADY AS VP PERCENT: 8 %
MDC IDC STAT BRADY AS VS PERCENT: 3 %

## 2013-09-18 NOTE — Progress Notes (Signed)
Pacemaker check in clinic. Normal device function. Thresholds, sensing, impedances consistent with previous measurements. Device programmed to maximize longevity. 94.7% mode switch + coumadin. No high ventricular rates noted. Device programmed at appropriate safety margins. Histogram distribution appropriate for patient activity level. Device programmed to optimize intrinsic conduction. Estimated longevity 15 mo.   ROV w/ Dr. Rayann Heman in 71mo.

## 2013-09-20 ENCOUNTER — Telehealth: Payer: Self-pay | Admitting: *Deleted

## 2013-09-20 NOTE — Telephone Encounter (Signed)
Received prescriber copy from [facility] to inform that pt has been provider with temporary supply of Namenda Rx d/t not being on Insurance formulary list; called [employer] at [phone] to initiate PA for medication [pt allergic to Aricept]. Informed we should received approval/denial response w/i 24-48 business hours/SLS

## 2013-09-25 NOTE — Telephone Encounter (Signed)
[place] has been denied.

## 2013-09-26 NOTE — Telephone Encounter (Signed)
Faxed the appeal paperwork to [facility]- pt has failed Aricept

## 2013-09-27 ENCOUNTER — Encounter: Payer: Self-pay | Admitting: Internal Medicine

## 2013-10-04 ENCOUNTER — Telehealth: Payer: Self-pay | Admitting: *Deleted

## 2013-10-04 NOTE — Telephone Encounter (Signed)
No changes to meds. Repeat PT/INR in 1 month.

## 2013-10-04 NOTE — Telephone Encounter (Signed)
Received message from [place] at [facility] that pt's most recent PT is 28.4 and INR 2.45.  Pt's current coumadin dosage:  3mg  on M,T, TH, F, Sun and 2mg  on Wed & Sat.  Debbie requests return call at [phone].  Please advise.

## 2013-10-04 NOTE — Telephone Encounter (Signed)
Notified [name] at [facility] and she voices understanding.

## 2013-11-01 ENCOUNTER — Telehealth: Payer: Self-pay | Admitting: Family Medicine

## 2013-11-01 NOTE — Telephone Encounter (Signed)
No changes to coumadin dose recheck PT/INR in one month

## 2013-11-01 NOTE — Telephone Encounter (Signed)
Please advise 

## 2013-11-01 NOTE — Telephone Encounter (Signed)
Helene Kelp is calling from [facility] with Current PT/ INR results, PT 22.0, INR 2.21. Call back with changes in coumadin orders if neccessary

## 2013-11-04 NOTE — Telephone Encounter (Signed)
Called and informed Rita Miller at [facility]

## 2013-11-14 ENCOUNTER — Encounter: Payer: Self-pay | Admitting: Physician Assistant

## 2013-11-14 ENCOUNTER — Ambulatory Visit (INDEPENDENT_AMBULATORY_CARE_PROVIDER_SITE_OTHER): Payer: Medicare Other | Admitting: Physician Assistant

## 2013-11-14 ENCOUNTER — Telehealth: Payer: Self-pay | Admitting: Family Medicine

## 2013-11-14 VITALS — BP 145/70 | HR 80 | Temp 97.9°F | Resp 14 | Ht 59.0 in | Wt 97.0 lb

## 2013-11-14 DIAGNOSIS — F29 Unspecified psychosis not due to a substance or known physiological condition: Secondary | ICD-10-CM

## 2013-11-14 DIAGNOSIS — F0390 Unspecified dementia without behavioral disturbance: Secondary | ICD-10-CM

## 2013-11-14 DIAGNOSIS — R410 Disorientation, unspecified: Secondary | ICD-10-CM

## 2013-11-14 DIAGNOSIS — N390 Urinary tract infection, site not specified: Secondary | ICD-10-CM

## 2013-11-14 LAB — COMPREHENSIVE METABOLIC PANEL
ALT: 18 U/L (ref 0–35)
AST: 37 U/L (ref 0–37)
Albumin: 4.1 g/dL (ref 3.5–5.2)
Alkaline Phosphatase: 81 U/L (ref 39–117)
BUN: 20 mg/dL (ref 6–23)
CALCIUM: 9.2 mg/dL (ref 8.4–10.5)
CHLORIDE: 103 meq/L (ref 96–112)
CO2: 25 meq/L (ref 19–32)
Creat: 0.86 mg/dL (ref 0.50–1.10)
Glucose, Bld: 90 mg/dL (ref 70–99)
Potassium: 4 mEq/L (ref 3.5–5.3)
Sodium: 140 mEq/L (ref 135–145)
Total Bilirubin: 0.9 mg/dL (ref 0.2–1.2)
Total Protein: 7.7 g/dL (ref 6.0–8.3)

## 2013-11-14 LAB — CBC
HCT: 38.9 % (ref 36.0–46.0)
HEMOGLOBIN: 13 g/dL (ref 12.0–15.0)
MCH: 30 pg (ref 26.0–34.0)
MCHC: 33.4 g/dL (ref 30.0–36.0)
MCV: 89.8 fL (ref 78.0–100.0)
Platelets: 268 10*3/uL (ref 150–400)
RBC: 4.33 MIL/uL (ref 3.87–5.11)
RDW: 15.9 % — AB (ref 11.5–15.5)
WBC: 7 10*3/uL (ref 4.0–10.5)

## 2013-11-14 NOTE — Patient Instructions (Signed)
Please obtain labs.  I will call you with your results.  Your neurological exam is normal at today's visit.  You do have some memory loss for which we are going to obtain a further workup. Please try to stay well-hydrated.  Follow-up with Dr. Charlett Blake or myself in 1 week for re-evaluation.

## 2013-11-14 NOTE — Telephone Encounter (Signed)
Patient daughter called in stating that she would like to be called when the lab results come in

## 2013-11-14 NOTE — Telephone Encounter (Signed)
Faxed request for Urinalysis d/t patient not being able to void in office today w/diagnosis of Altered Mental Status; nursing staff reports that patient does not have orders for Nebulizer and O2 stats do not warrant usage, but provider can order if they wish to/SLS

## 2013-11-15 LAB — URINALYSIS, ROUTINE W REFLEX MICROSCOPIC

## 2013-11-15 LAB — RPR

## 2013-11-15 LAB — HIV ANTIBODY (ROUTINE TESTING W REFLEX): HIV 1&2 Ab, 4th Generation: NONREACTIVE

## 2013-11-15 LAB — TSH: TSH: 1.136 u[IU]/mL (ref 0.350–4.500)

## 2013-11-15 LAB — FOLATE

## 2013-11-15 LAB — VITAMIN B12: VITAMIN B 12: 954 pg/mL — AB (ref 211–911)

## 2013-11-19 DIAGNOSIS — F0390 Unspecified dementia without behavioral disturbance: Secondary | ICD-10-CM | POA: Insufficient documentation

## 2013-11-19 NOTE — Progress Notes (Signed)
Patient presents to clinic today with her daughter who states she was contacted by the nursing home with the comment that her mother, the patient, had been seeming a little confused a few days ago.  No labs or examination was performed by provider at nursing home.  Patient with history of gradually worsening memory.  First mention upon record review was in 2010.  Patient and daughter deny fatigue, chest pain, SOB, urinary symptoms.  Patient and daughter denies witnessed AMS, denies focal neurologic deficit, weakness, facial drooping of aphasia.  Patient does have history of difficulty with swallowing, but no change noted from baseline by patient or daughter.  Patient has never been personally evaluated by this provider.  Daughter states she has not noticed anything out of the "regular" for her mother's behavior, but wanted her checked out due to nursing home's concern.  No records were brought from nursing home.  Past Medical History  Diagnosis Date  . CAD (coronary artery disease)   . History of CVA (cerebrovascular accident)   . Atrial fibrillation     peristent  . Hyperlipemia   . COPD (chronic obstructive pulmonary disease)   . Epistaxis     recurrent  . Osteoporosis   . Hypothyroidism   . History of pulmonary embolism   . History of colonic diverticulitis   . History of breast cancer   . Asthma   . Iron deficiency anemia   . History of transfusion of whole blood   . LBBB (left bundle branch block)     history of  . Aortal stenosis     severe  . Pacemaker     bradycardia s/p pacemaker implant  . PAD (peripheral artery disease)     left leg occluded viabahn stents x 3 left SFA  . Tachycardia-bradycardia syndrome     s/p PPM  . Cancer   . Arthritis   . Stroke   . HTN (hypertension) 09/25/2012  . HTN (hypertension) 09/25/2012    Current Outpatient Prescriptions on File Prior to Visit  Medication Sig Dispense Refill  . acetaminophen (TYLENOL) 500 MG tablet 2 tabs po bid x 14 days  then as needed for pain bid after that  30 tablet  0  . diltiazem (CARDIZEM CD) 240 MG 24 hr capsule Take 240 mg by mouth daily.       . fluticasone (FLOVENT HFA) 44 MCG/ACT inhaler Inhale 1 puff into the lungs 2 (two) times daily.        Marland Kitchen levothyroxine (SYNTHROID, LEVOTHROID) 75 MCG tablet Take 1 tablet (75 mcg total) by mouth daily.  90 tablet  3  . Melatonin 3 MG TABS Take 3 mg by mouth at bedtime as needed.       . memantine (NAMENDA) 10 MG tablet Take 10 mg by mouth daily.      Marland Kitchen nystatin cream (MYCOSTATIN) Apply 1 application topically 3 (three) times daily. Apply under left breast      . omeprazole (PRILOSEC) 20 MG capsule 20 mg. Take one capsule daily. Take on and empty stomach- Do no crush.      . polyethylene glycol (MIRALAX / GLYCOLAX) packet Take 17 g by mouth daily. Mix 17gm in 6-8 ounces of liquid daily.       . rivastigmine (EXELON) 9.5 mg/24hr Place 1 patch onto the skin daily.      . rosuvastatin (CRESTOR) 20 MG tablet Take 20 mg by mouth at bedtime.        Marland Kitchen SALINE NASAL SPRAY  NA Place 0.65 % into the nose. One spray in each nostril twice a day for nose bleeds       . tiotropium (SPIRIVA) 18 MCG inhalation capsule Place 18 mcg into inhaler and inhale daily.        . vitamin E 400 UNIT capsule Take 400 Units by mouth daily.        Marland Kitchen warfarin (COUMADIN) 2 MG tablet Take 2 mg by mouth as directed. As directed 2 mg on Wed and Sat 3 mg Mon, Tues, Thurs, Fri and Sun       No current facility-administered medications on file prior to visit.    Allergies  Allergen Reactions  . Donepezil Hydrochloride     REACTION: heart fluttering  . Morphine Sulfate   . Oxycodone-Acetaminophen     REACTION: Hallucinations  . Percocet [Oxycodone-Acetaminophen]     Family History  Problem Relation Age of Onset  . Breast cancer    . Colon cancer    . Diabetes    . Hyperlipidemia    . Hypertension    . Heart disease Mother     unknown heart disease    History   Social History  .  Marital Status: Widowed    Spouse Name: N/A    Number of Children: N/A  . Years of Education: N/A   Social History Main Topics  . Smoking status: Never Smoker   . Smokeless tobacco: Never Used  . Alcohol Use: No  . Drug Use: No  . Sexual Activity: None   Other Topics Concern  . None   Social History Narrative   Retired   Widow    1 daughter            Review of Systems - See HPI.  All other ROS are negative.  BP 145/70  Pulse 80  Temp(Src) 97.9 F (36.6 C) (Oral)  Resp 14  Ht 4\' 11"  (1.499 m)  Wt 97 lb (43.999 kg)  BMI 19.58 kg/m2  SpO2 95%  Physical Exam  Vitals reviewed. Constitutional: She is well-developed, well-nourished, and in no distress.  HENT:  Head: Normocephalic and atraumatic.  Right Ear: External ear normal.  Left Ear: External ear normal.  Nose: Nose normal.  Mouth/Throat: Oropharynx is clear and moist. No oropharyngeal exudate.  TM within normal limits bilaterally.  Eyes: Conjunctivae are normal. Pupils are equal, round, and reactive to light.  Neck: Neck supple.  Cardiovascular: Normal rate, regular rhythm, normal heart sounds and intact distal pulses.   Pulmonary/Chest: Effort normal and breath sounds normal. No respiratory distress. She has no wheezes. She has no rales. She exhibits no tenderness.  Abdominal: Soft. Bowel sounds are normal. She exhibits no distension. There is no tenderness.  Lymphadenopathy:    She has no cervical adenopathy.  Neurological: She has normal sensation, normal strength, normal reflexes and intact cranial nerves. She is not agitated. She displays facial symmetry. No cranial nerve deficit. GCS score is 15.  Alert to person but not place or time.  Skin: Skin is warm and dry. No rash noted.  Psychiatric:  Some inappropriate behavior noted.  Daughter states this is patient's baseline for the past few years.    Recent Results (from the past 2160 hour(s))  CBC WITH DIFFERENTIAL ([place])     Status: Abnormal    Collection Time    08/30/13 10:49 AM      Result Value Ref Range   WBC 6.6  3.9 - 10.0 10e3/uL   RBC  3.64 (*) 3.70 - 5.32 10e6/uL   HGB 11.2 (*) 11.6 - 15.9 g/dL   HCT 35.9  34.8 - 46.6 %   MCV 99  81 - 101 fL   MCH 30.8  26.0 - 34.0 pg   MCHC 31.2 (*) 32.0 - 36.0 g/dL   RDW 14.9  11.1 - 15.7 %   Platelets 233  145 - 400 10e3/uL   NEUT# 4.8  1.5 - 6.5 10e3/uL   LYMPH# 1.0  0.9 - 3.3 10e3/uL   MONO# 0.7  0.1 - 0.9 10e3/uL   Eosinophils Absolute 0.1  0.0 - 0.5 10e3/uL   BASO# 0.0  0.0 - 0.2 10e3/uL   NEUT% 72.8  39.6 - 80.0 %   LYMPH% 14.3  14.0 - 48.0 %   MONO% 10.7  0.0 - 13.0 %   EOS% 1.7  0.0 - 7.0 %   BASO% 0.5  0.0 - 2.0 %  IRON AND TIBC [facility]     Status: Abnormal   Collection Time    08/30/13 10:49 AM      Result Value Ref Range   Iron 44  41 - 142 ug/dL   TIBC 224 (*) 236 - 444 ug/dL   UIBC 180  120 - 384 ug/dL   %SAT 20 (*) 21 - 57 %  FERRITIN [facility]     Status: Abnormal   Collection Time    08/30/13 10:49 AM      Result Value Ref Range   Ferritin 286 (*) 9 - 269 ng/ml  PROTIME-INR     Status: None   Collection Time    09/05/13 12:00 AM      Result Value Ref Range   INR    0.9 - 1.1  MDC_IDC_ENUM_SESS_TYPE_INCLINIC     Status: None   Collection Time    09/18/13 10:21 AM      Result Value Ref Range   Pulse Generator Manufacturer Medtronic     Pulse Gen Model [id number] Sensia     Pulse Gen Serial Number [id number]     RV Sense Sensitivity 2     RA Pace Amplitude 2.5     RV Pace PulseWidth 0.4     RV Pace Amplitude 2.5     RA Impedance 867     RA Amplitude 1     RV IMPEDANCE 2028     RV Amplitude 5.6     RV Pacing Amplitude 0.75     RV Pacing PulseWidth 0.4     Battery Status Unknown     Battery Voltage 2.77     Battery Impedance 0.755     Brady RA Perc Paced 56     Brady RV Perc Paced 71     Eval Rhythm AF     Miscellaneous Comment       Value: Pacemaker check in clinic. Normal device function. Thresholds, sensing, impedances consistent with previous  measurements. Device programmed to maximize longevity. 1929 mode switches--86.3% of time. + Coumadin and ASA. 6 high ventricular rates noted.      Device programmed at appropriate safety margins. Histogram distribution appropriate for patient activity level. Device programmed to optimize intrinsic conduction. ROV in 6 mths w/device clinic.  MDC_IDC_ENUM_SESS_TYPE_INCLINIC     Status: None   Collection Time    09/18/13 10:22 AM      Result Value Ref Range   Date Time Interrogation Session [phone] Medtronic     Pulse Gen Model [id number] [place]  Pulse Gen Serial Number [id number]     RV Sense Sensitivity 2     RA Pace Amplitude 2.75     RV Pace PulseWidth 0.4     RV Pace Amplitude 2.5     RA Impedance 831     RA Amplitude 1.4     RV IMPEDANCE 2009     RV Amplitude 5.6     RV Pacing Amplitude 1     RV Pacing PulseWidth 0.4     Battery Status Unknown     Battery Longevity 15     Battery Voltage 2.72     Battery Impedance 2546     Brady AP VP Percent 88     Brady AS VP Percent 8     Brady AP VS Percent 1     Brady AS VS Percent 3     Eval Rhythm Afib w/ Vs @ 62-73     Miscellaneous Comment       Value: Pacemaker check in clinic. Normal device function. Thresholds, sensing, impedances consistent with previous measurements. Device programmed to maximize longevity. 94.7% mode switch + coumadin. No high ventricular rates noted. Device programmed at      appropriate safety margins. Histogram distribution appropriate for patient activity level. Device programmed to optimize intrinsic conduction. Estimated longevity 15 mo. ROV w/ JA in 15mo.  RPR     Status: None   Collection Time    11/14/13  3:29 PM      Result Value Ref Range   RPR NON REAC  NON REAC  VITAMIN B12     Status: Abnormal   Collection Time    11/14/13  3:29 PM      Result Value Ref Range   Vitamin B-12 954 (*) 211 - 911 pg/mL  CBC     Status: Abnormal   Collection Time    11/14/13   3:29 PM      Result Value Ref Range   WBC 7.0  4.0 - 10.5 K/uL   RBC 4.33  3.87 - 5.11 MIL/uL   Hemoglobin 13.0  12.0 - 15.0 g/dL   HCT 38.9  36.0 - 46.0 %   MCV 89.8  78.0 - 100.0 fL   MCH 30.0  26.0 - 34.0 pg   MCHC 33.4  30.0 - 36.0 g/dL   RDW 15.9 (*) 11.5 - 15.5 %   Platelets 268  150 - 400 K/uL  TSH     Status: None   Collection Time    11/14/13  3:29 PM      Result Value Ref Range   TSH 1.136  0.350 - 4.500 uIU/mL  FOLATE     Status: None   Collection Time    11/14/13  3:29 PM      Result Value Ref Range   Folate >20.0     Comment:       Reference Ranges             Deficient:       0.4 - 3.3 ng/mL             Indeterminate:   3.4 - 5.4 ng/mL             Normal:              > 5.4 ng/mL        COMPREHENSIVE METABOLIC PANEL     Status: None   Collection Time    11/14/13  3:29 PM  Result Value Ref Range   Sodium 140  135 - 145 mEq/L   Potassium 4.0  3.5 - 5.3 mEq/L   Chloride 103  96 - 112 mEq/L   CO2 25  19 - 32 mEq/L   Glucose, Bld 90  70 - 99 mg/dL   BUN 20  6 - 23 mg/dL   Creat 0.86  0.50 - 1.10 mg/dL   Total Bilirubin 0.9  0.2 - 1.2 mg/dL   Alkaline Phosphatase 81  39 - 117 U/L   AST 37  0 - 37 U/L   ALT 18  0 - 35 U/L   Total Protein 7.7  6.0 - 8.3 g/dL   Albumin 4.1  3.5 - 5.2 g/dL   Calcium 9.2  8.4 - 10.5 mg/dL  URINALYSIS, ROUTINE W REFLEX MICROSCOPIC     Status: None   Collection Time    11/14/13  3:29 PM      Result Value Ref Range   Color, Urine    YELLOW   APPearance    CLEAR   Specific Gravity, Urine    1.005 - 1.030   pH    5.0 - 8.0   Glucose, UA    NEG mg/dL   Bilirubin Urine    NEG   Ketones, ur    NEG mg/dL   Hgb urine dipstick    NEG   Protein, ur    NEG mg/dL   Urobilinogen, UA    0.0 - 1.0 mg/dL   Nitrite    NEG   Leukocytes, UA    NEG  HIV ANTIBODY (ROUTINE TESTING)     Status: None   Collection Time    11/14/13  3:29 PM      Result Value Ref Range   HIV 1&2 Ab, 4th Generation NONREACTIVE  NON-REACTIVE   Comment:        A NONREACTIVE HIV Ag/Ab result does not exclude HIV infection since     the time frame for seroconversion is variable. If acute HIV infection     is suspected, a HIV-1 RNA Qualitative TMA test is recommended.           HIV-1/2 Antibody Diff         Not indicated.     HIV-1 RNA, Qual TMA           Not indicated.           PLEASE NOTE: This information has been disclosed to you from records     whose confidentiality may be protected by state law. If your state     requires such protection, then the state law prohibits you from making     any further disclosure of the information without the specific written     consent of the person to whom it pertains, or as otherwise permitted     by law. A general authorization for the release of medical or other     information is NOT sufficient for this purpose.           The performance of this assay has not been clinically validated in     patients less than 89 years old.    Assessment/Plan: Dementia No evidence of acute neurological deficit concerning for CVA. Patient failed MMSE.  Problems stem from recent and remote memory.  Patient's ability to follow commands remain intact.  Memory loss first noted in 2010.  Suspect Alzheimer's.  Will obtain dementia panel.  Follow-up in 1 week with PCP.  Will then refer to Neurology for further dementia workup.  Alarm signs/symptoms reviewed with patient and daughter.

## 2013-11-19 NOTE — Assessment & Plan Note (Addendum)
No evidence of acute neurological deficit concerning for CVA. Patient failed MMSE.  Problems stem from recent and remote memory.  Patient's ability to follow commands remain intact.  Memory loss first noted in 2010.  Suspect Alzheimer's.  Will obtain dementia panel. UA unremarkable.  Follow-up in 1 week with PCP or myself.  Will then refer to Neurology for further dementia workup.  Alarm signs/symptoms reviewed with patient and daughter.

## 2013-11-20 MED ORDER — CEFDINIR 300 MG PO CAPS: 300.0000 mg | ORAL_CAPSULE | Freq: Two times a day (BID) | ORAL | Status: DC

## 2013-11-20 NOTE — Telephone Encounter (Addendum)
[place] and spoke with Helene Kelp in Nursing RE: results of pt's U/A ordered on 04.09.15; informed that the test had been ran & resulted but was probably sent to Medical Records, who was not in today. Explain that provider has been awaiting these results to treat, if needed, d/t patient having issues with mental clarity and we needed to have these results faxed to [place] ASAP. Nurse stated she understood & agreed; ordering provider informed/SLS

## 2013-11-20 NOTE — Telephone Encounter (Signed)
Faxed to [place], Attn: [place] [per our phone conversation] 1) Physician Order for Urine Culture, comprehensive 2) Antibiotic Rx: Cefdinir 300 mg for UTI 3) Follow-up Appointment: Beatris Ship, 04.21.15 at 1:15p w/Dr. Charlett Blake Patient's daughter has been informed of all information as well/SLS

## 2013-11-20 NOTE — Telephone Encounter (Signed)
UA results finally received from [facility], sent to [place] from the Nursing Home.  Ordering MD from home did not follow-up with results.  States they were waiting to hear from [place] although they had not sent over results.  UA positive for leukocytes and blood.  No culture was obtained from ordering physician.  Will empirically start patient on Cefdinir 300 mg BID x 5 days.  Nursing home needs to obtain a repeat urine sample and send it for culture.  We can fax the orders over if needed.  Patient's daughter needs to be made aware of the situation so that she can help facilitate a follow-up appointment this week.

## 2013-11-25 ENCOUNTER — Telehealth: Payer: Self-pay

## 2013-11-25 NOTE — Telephone Encounter (Signed)
I spoke to [place] at [facility] and she confirmed pt has only taken Lipitor 20 mg and has been on Crestor 20 mg since. I informed [name] that insurance is not wanting to cover the Crestor and that we would probably be changing to Pravastatin and she stated that we can just fax a new RX to [phone]  Please advise?

## 2013-11-25 NOTE — Telephone Encounter (Signed)
Try Pravastatin 20 mg po qhs, disp #30 with 3 rf and recheck lipid panel and LFTs in 3 months

## 2013-11-26 ENCOUNTER — Encounter: Payer: Self-pay | Admitting: Family Medicine

## 2013-11-26 ENCOUNTER — Ambulatory Visit (INDEPENDENT_AMBULATORY_CARE_PROVIDER_SITE_OTHER): Payer: Medicare Other | Admitting: Family Medicine

## 2013-11-26 VITALS — BP 130/52 | HR 88 | Temp 97.9°F | Ht 59.0 in | Wt 102.1 lb

## 2013-11-26 DIAGNOSIS — E039 Hypothyroidism, unspecified: Secondary | ICD-10-CM

## 2013-11-26 DIAGNOSIS — I10 Essential (primary) hypertension: Secondary | ICD-10-CM

## 2013-11-26 DIAGNOSIS — N390 Urinary tract infection, site not specified: Secondary | ICD-10-CM

## 2013-11-26 DIAGNOSIS — E785 Hyperlipidemia, unspecified: Secondary | ICD-10-CM

## 2013-11-26 MED ORDER — PRAVASTATIN SODIUM 20 MG PO TABS: 20.0000 mg | ORAL_TABLET | Freq: Every day | ORAL | Status: DC

## 2013-11-26 NOTE — Patient Instructions (Signed)
Urinary Tract Infection  Urinary tract infections (UTIs) can develop anywhere along your urinary tract. Your urinary tract is your body's drainage system for removing wastes and extra water. Your urinary tract includes two kidneys, two ureters, a bladder, and a urethra. Your kidneys are a pair of bean-shaped organs. Each kidney is about the size of your fist. They are located below your ribs, one on each side of your spine.  CAUSES  Infections are caused by microbes, which are microscopic organisms, including fungi, viruses, and bacteria. These organisms are so small that they can only be seen through a microscope. Bacteria are the microbes that most commonly cause UTIs.  SYMPTOMS   Symptoms of UTIs may vary by age and gender of the patient and by the location of the infection. Symptoms in young women typically include a frequent and intense urge to urinate and a painful, burning feeling in the bladder or urethra during urination. Older women and men are more likely to be tired, shaky, and weak and have muscle aches and abdominal pain. A fever may mean the infection is in your kidneys. Other symptoms of a kidney infection include pain in your back or sides below the ribs, nausea, and vomiting.  DIAGNOSIS  To diagnose a UTI, your caregiver will ask you about your symptoms. Your caregiver also will ask to provide a urine sample. The urine sample will be tested for bacteria and white blood cells. White blood cells are made by your body to help fight infection.  TREATMENT   Typically, UTIs can be treated with medication. Because most UTIs are caused by a bacterial infection, they usually can be treated with the use of antibiotics. The choice of antibiotic and length of treatment depend on your symptoms and the type of bacteria causing your infection.  HOME CARE INSTRUCTIONS   If you were prescribed antibiotics, take them exactly as your caregiver instructs you. Finish the medication even if you feel better after you  have only taken some of the medication.   Drink enough water and fluids to keep your urine clear or pale yellow.   Avoid caffeine, tea, and carbonated beverages. They tend to irritate your bladder.   Empty your bladder often. Avoid holding urine for long periods of time.   Empty your bladder before and after sexual intercourse.   After a bowel movement, women should cleanse from front to back. Use each tissue only once.  SEEK MEDICAL CARE IF:    You have back pain.   You develop a fever.   Your symptoms do not begin to resolve within 3 days.  SEEK IMMEDIATE MEDICAL CARE IF:    You have severe back pain or lower abdominal pain.   You develop chills.   You have nausea or vomiting.   You have continued burning or discomfort with urination.  MAKE SURE YOU:    Understand these instructions.   Will watch your condition.   Will get help right away if you are not doing well or get worse.  Document Released: 05/04/2005 Document Revised: 01/24/2012 Document Reviewed: 09/02/2011  [employer] Patient Information 2014 [employer].

## 2013-11-26 NOTE — Progress Notes (Signed)
Pre visit review using our clinic review tool, if applicable. No additional management support is needed unless otherwise documented below in the visit note. 

## 2013-11-26 NOTE — Telephone Encounter (Signed)
RX and information faxed to number below

## 2013-12-01 ENCOUNTER — Encounter: Payer: Self-pay | Admitting: Family Medicine

## 2013-12-01 NOTE — Assessment & Plan Note (Signed)
Has responded to Cefdinir symptomatically, will have her facility return with a urine sample to check for clearance when antibiotic complete

## 2013-12-01 NOTE — Progress Notes (Signed)
Patient ID: Rita Miller, female   DOB: 03/10/1930, 80 y.o.   MRN: [id number] Rita Miller [id number] [birth year] 12/01/2013      Progress Note-Follow Up  Subjective  Chief Complaint  Chief Complaint  Patient presents with  . Follow-up    uti    HPI  Patient is a 80 year old female in today for routine medical care. She has been brought i n today by her facility. She is doing much better s/p treatment for a UTI with Cefdinir. Her confusion and agitation have resolved. No other recent concern. Is eating well and sleeping, still speaks of wanting to return to [place] but is doing well in her [place]. Denies CP/palp/SOB/HA/congestion/fevers/GI or GU c/o. Taking meds as prescribed  Past Medical History  Diagnosis Date  . CAD (coronary artery disease)   . History of CVA (cerebrovascular accident)   . Atrial fibrillation     peristent  . Hyperlipemia   . COPD (chronic obstructive pulmonary disease)   . Epistaxis     recurrent  . Osteoporosis   . Hypothyroidism   . History of pulmonary embolism   . History of colonic diverticulitis   . History of breast cancer   . Asthma   . Iron deficiency anemia   . History of transfusion of whole blood   . LBBB (left bundle branch block)     history of  . Aortal stenosis     severe  . Pacemaker     bradycardia s/p pacemaker implant  . PAD (peripheral artery disease)     left leg occluded viabahn stents x 3 left SFA  . Tachycardia-bradycardia syndrome     s/p PPM  . Cancer   . Arthritis   . Stroke   . HTN (hypertension) 09/25/2012  . HTN (hypertension) 09/25/2012    Past Surgical History  Procedure Laterality Date  . Thyroidectomy      due to cancer  . Total hip arthroplasty    . Abdominal hysterectomy    . Mastectomy    . Mitral valve repair    . Intraocular lens insertion      left eye implant  . Pacemaker placement      for tachy/brady syndrome    Family History  Problem Relation Age of Onset  . Breast cancer    .  Colon cancer    . Diabetes    . Hyperlipidemia    . Hypertension    . Heart disease Mother     unknown heart disease    History   Social History  . Marital Status: Widowed    Spouse Name: N/A    Number of Children: N/A  . Years of Education: N/A   Occupational History  . Not on file.   Social History Main Topics  . Smoking status: Never Smoker   . Smokeless tobacco: Never Used  . Alcohol Use: No  . Drug Use: No  . Sexual Activity: Not on file   Other Topics Concern  . Not on file   Social History Narrative   Retired   Widow    1 daughter             Current Outpatient Prescriptions on File Prior to Visit  Medication Sig Dispense Refill  . acetaminophen (TYLENOL) 500 MG tablet 2 tabs po bid x 14 days then as needed for pain bid after that  30 tablet  0  . cefdinir (OMNICEF) 300 MG capsule Take 1 capsule (300 mg total) by  mouth 2 (two) times daily.  10 capsule  0  . diltiazem (CARDIZEM CD) 240 MG 24 hr capsule Take 240 mg by mouth daily.       . ergocalciferol (VITAMIN D2) 50000 UNITS capsule Take 50,000 Units by mouth once a week.      . fluticasone (FLOVENT HFA) 44 MCG/ACT inhaler Inhale 1 puff into the lungs 2 (two) times daily.        Marland Kitchen levothyroxine (SYNTHROID, LEVOTHROID) 75 MCG tablet Take 1 tablet (75 mcg total) by mouth daily.  90 tablet  3  . Melatonin 3 MG TABS Take 3 mg by mouth at bedtime as needed.       . memantine (NAMENDA) 10 MG tablet Take 10 mg by mouth daily.      Marland Kitchen nystatin cream (MYCOSTATIN) Apply 1 application topically 3 (three) times daily. Apply under left breast      . omeprazole (PRILOSEC) 20 MG capsule 20 mg. Take one capsule daily. Take on and empty stomach- Do no crush.      . polyethylene glycol (MIRALAX / GLYCOLAX) packet Take 17 g by mouth daily. Mix 17gm in 6-8 ounces of liquid daily.       . rivastigmine (EXELON) 9.5 mg/24hr Place 1 patch onto the skin daily.      Marland Kitchen SALINE NASAL SPRAY NA Place 0.65 % into the nose. One spray in each  nostril twice a day for nose bleeds       . tiotropium (SPIRIVA) 18 MCG inhalation capsule Place 18 mcg into inhaler and inhale daily.        . vitamin E 400 UNIT capsule Take 400 Units by mouth daily.        Marland Kitchen warfarin (COUMADIN) 2 MG tablet Take 2 mg by mouth as directed. As directed 2 mg on Wed and Sat 3 mg Mon, Tues, Thurs, Fri and Sun      . pravastatin (PRAVACHOL) 20 MG tablet Take 1 tablet (20 mg total) by mouth daily.  30 tablet  3   No current facility-administered medications on file prior to visit.    Allergies  Allergen Reactions  . Donepezil Hydrochloride     REACTION: heart fluttering  . Morphine Sulfate   . Oxycodone-Acetaminophen     REACTION: Hallucinations  . Percocet [Oxycodone-Acetaminophen]     Review of Systems  Review of Systems  Constitutional: Negative for fever and malaise/fatigue.  HENT: Negative for congestion.   Eyes: Negative for discharge.  Respiratory: Negative for shortness of breath.   Cardiovascular: Negative for chest pain, palpitations and leg swelling.  Gastrointestinal: Negative for nausea, abdominal pain and diarrhea.  Genitourinary: Negative for dysuria.  Musculoskeletal: Negative for falls.  Skin: Negative for rash.  Neurological: Negative for loss of consciousness and headaches.  Endo/Heme/Allergies: Negative for polydipsia.  Psychiatric/Behavioral: Positive for memory loss. Negative for depression and suicidal ideas. The patient is not nervous/anxious and does not have insomnia.     Objective  BP 130/52  Pulse 88  Temp(Src) 97.9 F (36.6 C) (Oral)  Ht 4\' 11"  (1.499 m)  Wt 102 lb 1.9 oz (46.321 kg)  BMI 20.61 kg/m2  SpO2 %  Physical Exam  Physical Exam  Constitutional: She is oriented to person, place, and time and well-developed, well-nourished, and in no distress. No distress.  HENT:  Head: Normocephalic and atraumatic.  Eyes: Conjunctivae are normal.  Neck: Neck supple. No thyromegaly present.  Cardiovascular:  Normal rate and regular rhythm.   Murmur heard. Pulmonary/Chest: Effort  normal and breath sounds normal. She has no wheezes.  Abdominal: She exhibits no distension and no mass.  Musculoskeletal: She exhibits no edema.  Lymphadenopathy:    She has no cervical adenopathy.  Neurological: She is alert and oriented to person, place, and time.  Skin: Skin is warm and dry. No rash noted. She is not diaphoretic.  Psychiatric: Memory, affect and judgment normal.    Lab Results  Component Value Date   TSH 1.136 11/14/2013   Lab Results  Component Value Date   WBC 7.0 11/14/2013   HGB 13.0 11/14/2013   HCT 38.9 11/14/2013   MCV 89.8 11/14/2013   PLT 268 11/14/2013   Lab Results  Component Value Date   CREATININE 0.86 11/14/2013   BUN 20 11/14/2013   NA 140 11/14/2013   K 4.0 11/14/2013   CL 103 11/14/2013   CO2 25 11/14/2013   Lab Results  Component Value Date   ALT 18 11/14/2013   AST 37 11/14/2013   ALKPHOS 81 11/14/2013   BILITOT 0.9 11/14/2013   Lab Results  Component Value Date   CHOL 135 05/01/2013   Lab Results  Component Value Date   HDL 62 05/01/2013   Lab Results  Component Value Date   LDLCALC 64 05/01/2013   Lab Results  Component Value Date   TRIG 45 05/01/2013   Lab Results  Component Value Date   CHOLHDL 2.2 05/01/2013     Assessment & Plan  HTN (hypertension) Well controlled, no changes to meds. Encouraged heart healthy diet such as the DASH diet and exercise as tolerated.   HYPERLIPIDEMIA Tolerating statin, encouraged heart healthy diet, avoid trans fats, minimize simple carbs and saturated fats. Increase exercise as tolerated. Insurance has recently required [place] to change her statin. Reassess lipids in 3 months  HYPOTHYROIDISM On Levothyroxine, continue to monitor  UTI (urinary tract infection) Has responded to Cefdinir symptomatically, will have her facility return with a urine sample to check for clearance when antibiotic complete

## 2013-12-01 NOTE — Assessment & Plan Note (Signed)
On Levothyroxine, continue to monitor 

## 2013-12-01 NOTE — Assessment & Plan Note (Signed)
Tolerating statin, encouraged heart healthy diet, avoid trans fats, minimize simple carbs and saturated fats. Increase exercise as tolerated. Insurance has recently required [place] to change her statin. Reassess lipids in 3 months

## 2013-12-01 NOTE — Assessment & Plan Note (Signed)
Well controlled, no changes to meds. Encouraged heart healthy diet such as the DASH diet and exercise as tolerated.  °

## 2013-12-05 ENCOUNTER — Telehealth: Payer: Self-pay | Admitting: Family Medicine

## 2013-12-05 NOTE — Telephone Encounter (Signed)
[employer] mail order calling to request status on PA for Flovent case #[id number]

## 2013-12-05 NOTE — Telephone Encounter (Signed)
Received PA paperwork forward to nurse

## 2013-12-09 ENCOUNTER — Telehealth: Payer: Self-pay | Admitting: Family Medicine

## 2013-12-09 MED ORDER — NITROFURANTOIN MONOHYD MACRO 100 MG PO CAPS: 100.0000 mg | ORAL_CAPSULE | Freq: Two times a day (BID) | ORAL | Status: DC

## 2013-12-09 NOTE — Telephone Encounter (Signed)
rx faxed to pharmacy and drs order faxed to [phone]

## 2013-12-09 NOTE — Telephone Encounter (Signed)
Start Macrobid 100 mg po bid x 7 days. Continue coumadin dose at same strength and recheck UA and c&s and PT/INR in 2 weeks

## 2013-12-09 NOTE — Telephone Encounter (Signed)
Lattie Haw from [facility] called stating that patients PT/INR was 1.67 last Thursday.  Urinalysis is greater than 100,000 that is final and is acceptable to gentamicin or macrobid

## 2013-12-09 NOTE — Telephone Encounter (Signed)
Please advise urinalysis and PT/INR

## 2013-12-10 ENCOUNTER — Telehealth: Payer: Self-pay | Admitting: Family Medicine

## 2013-12-10 NOTE — Telephone Encounter (Signed)
[facility] [place] said earlier they were going to fax these over?

## 2013-12-10 NOTE — Telephone Encounter (Signed)
Faxed approval to pharmacy also

## 2013-12-10 NOTE — Telephone Encounter (Signed)
Flovent has been approved from 08-08-13 through 08-07-14

## 2013-12-10 NOTE — Telephone Encounter (Signed)
FYI

## 2013-12-10 NOTE — Telephone Encounter (Signed)
Chanda Busing called in stating that patient has fallen and is complaining of right hip and thigh pain and wanted a verbal order to have x-rays done.([facility] has a mobile unit). Christy, Dr. Frederik Pear nurse said to go ahead and give the verbal order and that she would let Dr. Charlett Blake know. That Dr. Charlett Blake would give the verbal.

## 2013-12-10 NOTE — Telephone Encounter (Signed)
Perfect they just need to get me results quickly

## 2013-12-12 ENCOUNTER — Ambulatory Visit: Payer: Medicare Other | Admitting: Family Medicine

## 2013-12-12 NOTE — Telephone Encounter (Signed)
Per md inform facility that pts xrays were normal  i informed Dee at [facility] faxed to [employer]

## 2013-12-19 ENCOUNTER — Ambulatory Visit (INDEPENDENT_AMBULATORY_CARE_PROVIDER_SITE_OTHER): Payer: Medicare Other | Admitting: Physician Assistant

## 2013-12-19 ENCOUNTER — Ambulatory Visit (HOSPITAL_BASED_OUTPATIENT_CLINIC_OR_DEPARTMENT_OTHER)
Admission: RE | Admit: 2013-12-19 | Discharge: 2013-12-19 | Disposition: A | Discharge status: Home / Self Care | Payer: Medicare Other | Source: Ambulatory Visit | Attending: Physician Assistant | Admitting: Physician Assistant

## 2013-12-19 ENCOUNTER — Encounter: Payer: Self-pay | Admitting: Physician Assistant

## 2013-12-19 VITALS — BP 116/58 | HR 79 | Temp 98.1°F | Resp 14 | Ht 59.0 in | Wt 98.5 lb

## 2013-12-19 DIAGNOSIS — R143 Flatulence: Secondary | ICD-10-CM

## 2013-12-19 DIAGNOSIS — R140 Abdominal distension (gaseous): Secondary | ICD-10-CM

## 2013-12-19 DIAGNOSIS — R141 Gas pain: Secondary | ICD-10-CM

## 2013-12-19 DIAGNOSIS — R142 Eructation: Secondary | ICD-10-CM

## 2013-12-19 DIAGNOSIS — K5900 Constipation, unspecified: Secondary | ICD-10-CM | POA: Insufficient documentation

## 2013-12-19 DIAGNOSIS — M47817 Spondylosis without myelopathy or radiculopathy, lumbosacral region: Secondary | ICD-10-CM | POA: Insufficient documentation

## 2013-12-19 LAB — COMPREHENSIVE METABOLIC PANEL
ALBUMIN: 3.1 g/dL — AB (ref 3.5–5.2)
ALT: 23 U/L (ref 0–35)
AST: 26 U/L (ref 0–37)
Alkaline Phosphatase: 66 U/L (ref 39–117)
BILIRUBIN TOTAL: 0.8 mg/dL (ref 0.2–1.2)
BUN: 26 mg/dL — ABNORMAL HIGH (ref 6–23)
CO2: 26 mEq/L (ref 19–32)
Calcium: 9 mg/dL (ref 8.4–10.5)
Chloride: 104 mEq/L (ref 96–112)
Creat: 1 mg/dL (ref 0.50–1.10)
GLUCOSE: 81 mg/dL (ref 70–99)
POTASSIUM: 4.3 meq/L (ref 3.5–5.3)
SODIUM: 137 meq/L (ref 135–145)
TOTAL PROTEIN: 6.1 g/dL (ref 6.0–8.3)

## 2013-12-19 NOTE — Patient Instructions (Signed)
Please obtain labs. I will call you with your results.  Increase fluid intake.  Take miralax as directed.  Follow-up with Dr. Charlett Blake in 2 weeks.  Return sooner if needed.

## 2013-12-19 NOTE — Progress Notes (Signed)
Patient presents to clinic today from nursing facility with her daughter and with a nurse aide.  According to daughter, patient has been complaining of abdominal bloating over the past few weeks.  Nurse aide from SNF states that patient has not expressed any concerns to the staff.  Patient denies abdominal pain, discomfort, change in urination, nausea or vomiting.  Patient and nurse aide endorse good intake, although daughter states patient has complained of no appetite. Patient does endorse some hard stools.  Endorses BM and flatulence.  Patient does have advancing dementia.  Past Medical History  Diagnosis Date  . CAD (coronary artery disease)   . History of CVA (cerebrovascular accident)   . Atrial fibrillation     peristent  . Hyperlipemia   . COPD (chronic obstructive pulmonary disease)   . Epistaxis     recurrent  . Osteoporosis   . Hypothyroidism   . History of pulmonary embolism   . History of colonic diverticulitis   . History of breast cancer   . Asthma   . Iron deficiency anemia   . History of transfusion of whole blood   . LBBB (left bundle branch block)     history of  . Aortal stenosis     severe  . Pacemaker     bradycardia s/p pacemaker implant  . PAD (peripheral artery disease)     left leg occluded viabahn stents x 3 left SFA  . Tachycardia-bradycardia syndrome     s/p PPM  . Cancer   . Arthritis   . Stroke   . HTN (hypertension) 09/25/2012  . HTN (hypertension) 09/25/2012    Current Outpatient Prescriptions on File Prior to Visit  Medication Sig Dispense Refill  . acetaminophen (TYLENOL) 500 MG tablet 2 tabs po bid x 14 days then as needed for pain bid after that  30 tablet  0  . diltiazem (CARDIZEM CD) 240 MG 24 hr capsule Take 240 mg by mouth daily.       . ergocalciferol (VITAMIN D2) 50000 UNITS capsule Take 50,000 Units by mouth once a week.      . fluticasone (FLOVENT HFA) 44 MCG/ACT inhaler Inhale 1 puff into the lungs 2 (two) times daily.        Marland Kitchen  levothyroxine (SYNTHROID, LEVOTHROID) 75 MCG tablet Take 1 tablet (75 mcg total) by mouth daily.  90 tablet  3  . Melatonin 3 MG TABS Take 3 mg by mouth at bedtime as needed.       . memantine (NAMENDA) 10 MG tablet Take 10 mg by mouth daily.      Marland Kitchen nystatin cream (MYCOSTATIN) Apply 1 application topically 3 (three) times daily. Apply under left breast      . omeprazole (PRILOSEC) 20 MG capsule 20 mg. Take one capsule daily. Take on and empty stomach- Do no crush.      . polyethylene glycol (MIRALAX / GLYCOLAX) packet Take 17 g by mouth daily. Mix 17gm in 6-8 ounces of liquid daily.       . pravastatin (PRAVACHOL) 20 MG tablet Take 1 tablet (20 mg total) by mouth daily.  30 tablet  3  . SALINE NASAL SPRAY NA Place 0.65 % into the nose. One spray in each nostril twice a day for nose bleeds       . tiotropium (SPIRIVA) 18 MCG inhalation capsule Place 18 mcg into inhaler and inhale daily.        . vitamin E 400 UNIT capsule Take 400 Units by  mouth daily.        Marland Kitchen warfarin (COUMADIN) 2 MG tablet Take 2 mg by mouth as directed. As directed 2 mg on Wed and Sat 3 mg Mon, Tues, Thurs, Fri and Sun       No current facility-administered medications on file prior to visit.    Allergies  Allergen Reactions  . Donepezil Hydrochloride     REACTION: heart fluttering  . Morphine Sulfate   . Oxycodone-Acetaminophen     REACTION: Hallucinations  . Percocet [Oxycodone-Acetaminophen]     Family History  Problem Relation Age of Onset  . Breast cancer    . Colon cancer    . Diabetes    . Hyperlipidemia    . Hypertension    . Heart disease Mother     unknown heart disease    History   Social History  . Marital Status: Widowed    Spouse Name: N/A    Number of Children: N/A  . Years of Education: N/A   Social History Main Topics  . Smoking status: Never Smoker   . Smokeless tobacco: Never Used  . Alcohol Use: No  . Drug Use: No  . Sexual Activity: None   Other Topics Concern  . None    Social History Narrative   Retired   Widow    1 daughter            Review of Systems - See HPI.  All other ROS are negative.  BP 116/58  Pulse 79  Temp(Src) 98.1 F (36.7 C) (Oral)  Resp 14  Ht 4\' 11"  (1.499 m)  Wt 98 lb 8 oz (44.679 kg)  BMI 19.88 kg/m2  SpO2 98%  Physical Exam  Vitals reviewed. Constitutional: She is well-developed, well-nourished, and in no distress.  HENT:  Head: Normocephalic and atraumatic.  Right Ear: External ear normal.  Left Ear: External ear normal.  Nose: Nose normal.  Mouth/Throat: Oropharynx is clear and moist. No oropharyngeal exudate.  Eyes: Conjunctivae are normal.  Neck: Neck supple.  Cardiovascular: Normal rate, regular rhythm, normal heart sounds and intact distal pulses.   Pulmonary/Chest: Effort normal and breath sounds normal. No respiratory distress. She has no wheezes. She has no rales. She exhibits no tenderness.  Abdominal: Soft. Bowel sounds are normal. She exhibits no distension and no mass. There is no rebound and no guarding.  Patient exhibits questionable diffuse tenderness to palpation.  Patient states "ouch" before there is even contact between my hand and her abdomen.  At other times, when patient is distracted by her daughter there is no evidence of pain or discomfort, even with deep palpation.  Lymphadenopathy:    She has no cervical adenopathy.  Neurological: She is alert. No cranial nerve deficit.  Patient oriented to person and place but not time.  Skin: Skin is warm and dry. No rash noted.  Psychiatric: Affect normal.    Recent Results (from the past 2160 hour(s))  RPR     Status: None   Collection Time    11/14/13  3:29 PM      Result Value Ref Range   RPR NON REAC  NON REAC  VITAMIN B12     Status: Abnormal   Collection Time    11/14/13  3:29 PM      Result Value Ref Range   Vitamin B-12 954 (*) 211 - 911 pg/mL  CBC     Status: Abnormal   Collection Time    11/14/13  3:29 PM  Result Value Ref  Range   WBC 7.0  4.0 - 10.5 K/uL   RBC 4.33  3.87 - 5.11 MIL/uL   Hemoglobin 13.0  12.0 - 15.0 g/dL   HCT 38.9  36.0 - 46.0 %   MCV 89.8  78.0 - 100.0 fL   MCH 30.0  26.0 - 34.0 pg   MCHC 33.4  30.0 - 36.0 g/dL   RDW 15.9 (*) 11.5 - 15.5 %   Platelets 268  150 - 400 K/uL  TSH     Status: None   Collection Time    11/14/13  3:29 PM      Result Value Ref Range   TSH 1.136  0.350 - 4.500 uIU/mL  FOLATE     Status: None   Collection Time    11/14/13  3:29 PM      Result Value Ref Range   Folate >20.0     Comment:       Reference Ranges             Deficient:       0.4 - 3.3 ng/mL             Indeterminate:   3.4 - 5.4 ng/mL             Normal:              > 5.4 ng/mL        COMPREHENSIVE METABOLIC PANEL     Status: None   Collection Time    11/14/13  3:29 PM      Result Value Ref Range   Sodium 140  135 - 145 mEq/L   Potassium 4.0  3.5 - 5.3 mEq/L   Chloride 103  96 - 112 mEq/L   CO2 25  19 - 32 mEq/L   Glucose, Bld 90  70 - 99 mg/dL   BUN 20  6 - 23 mg/dL   Creat 0.86  0.50 - 1.10 mg/dL   Total Bilirubin 0.9  0.2 - 1.2 mg/dL   Alkaline Phosphatase 81  39 - 117 U/L   AST 37  0 - 37 U/L   ALT 18  0 - 35 U/L   Total Protein 7.7  6.0 - 8.3 g/dL   Albumin 4.1  3.5 - 5.2 g/dL   Calcium 9.2  8.4 - 10.5 mg/dL  URINALYSIS, ROUTINE W REFLEX MICROSCOPIC     Status: None   Collection Time    11/14/13  3:29 PM      Result Value Ref Range   Color, Urine    YELLOW   APPearance    CLEAR   Specific Gravity, Urine    1.005 - 1.030   pH    5.0 - 8.0   Glucose, UA    NEG mg/dL   Bilirubin Urine    NEG   Ketones, ur    NEG mg/dL   Hgb urine dipstick    NEG   Protein, ur    NEG mg/dL   Urobilinogen, UA    0.0 - 1.0 mg/dL   Nitrite    NEG   Leukocytes, UA    NEG  HIV ANTIBODY (ROUTINE TESTING)     Status: None   Collection Time    11/14/13  3:29 PM      Result Value Ref Range   HIV 1&2 Ab, 4th Generation NONREACTIVE  NON-REACTIVE   Comment:       A NONREACTIVE HIV Ag/Ab  result does not  exclude HIV infection since     the time frame for seroconversion is variable. If acute HIV infection     is suspected, a HIV-1 RNA Qualitative TMA test is recommended.           HIV-1/2 Antibody Diff         Not indicated.     HIV-1 RNA, Qual TMA           Not indicated.           PLEASE NOTE: This information has been disclosed to you from records     whose confidentiality may be protected by state law. If your state     requires such protection, then the state law prohibits you from making     any further disclosure of the information without the specific written     consent of the person to whom it pertains, or as otherwise permitted     by law. A general authorization for the release of medical or other     information is NOT sufficient for this purpose.           The performance of this assay has not been clinically validated in     patients less than 66 years old.  CBC WITH DIFFERENTIAL     Status: Abnormal   Collection Time    12/19/13 12:32 PM      Result Value Ref Range   WBC 9.9  4.0 - 10.5 K/uL   RBC 4.09  3.87 - 5.11 MIL/uL   Hemoglobin 12.3  12.0 - 15.0 g/dL   HCT 37.1  36.0 - 46.0 %   MCV 90.7  78.0 - 100.0 fL   MCH 30.1  26.0 - 34.0 pg   MCHC 33.2  30.0 - 36.0 g/dL   RDW 16.2 (*) 11.5 - 15.5 %   Platelets 292  150 - 400 K/uL   Neutrophils Relative % 66  43 - 77 %   Neutro Abs 6.5  1.7 - 7.7 K/uL   Lymphocytes Relative 14  12 - 46 %   Lymphs Abs 1.4  0.7 - 4.0 K/uL   Monocytes Relative 6  3 - 12 %   Monocytes Absolute 0.6  0.1 - 1.0 K/uL   Eosinophils Relative 14 (*) 0 - 5 %   Eosinophils Absolute 1.4 (*) 0.0 - 0.7 K/uL   Basophils Relative 0  0 - 1 %   Basophils Absolute 0.0  0.0 - 0.1 K/uL   Smear Review SEE NOTE     Comment: Mild left shift (1-5% metas, occ myelo, occ bands)     RBCs and platelets are unremarkable.        COMPREHENSIVE METABOLIC PANEL     Status: Abnormal   Collection Time    12/19/13 12:32 PM      Result Value Ref Range    Sodium 137  135 - 145 mEq/L   Potassium 4.3  3.5 - 5.3 mEq/L   Chloride 104  96 - 112 mEq/L   CO2 26  19 - 32 mEq/L   Glucose, Bld 81  70 - 99 mg/dL   BUN 26 (*) 6 - 23 mg/dL   Creat 1.00  0.50 - 1.10 mg/dL   Total Bilirubin 0.8  0.2 - 1.2 mg/dL   Alkaline Phosphatase 66  39 - 117 U/L   AST 26  0 - 37 U/L   ALT 23  0 - 35 U/L   Total Protein 6.1  6.0 -  8.3 g/dL   Albumin 3.1 (*) 3.5 - 5.2 g/dL   Calcium 9.0  8.4 - 10.5 mg/dL    Assessment/Plan: Abdominal distention Endorsed.  No distention on examination.  Questionable tenderness -- more so patient giving what she thinks is a desired response as a result of her dementia.  Will obtain CBC, CMP and DG abdomen.  Will have SNF obtain urine as we were not able to obtain sufficient sample in office.  Increase fluid intake.  Daily probiotic. Follow-up with PCP if symptoms are not improving.  Of note, there seems to be discord between patient's daughter and the nursing staff that is present for visit.  Staff states that the patient has not expressed any complaints to them.  Endorses patient seems to exhibit attention-seeking behavior when daughter visits.

## 2013-12-19 NOTE — Progress Notes (Signed)
Pre visit review using our clinic review tool, if applicable. No additional management support is needed unless otherwise documented below in the visit note/SLS  

## 2013-12-20 ENCOUNTER — Encounter: Payer: Self-pay | Admitting: *Deleted

## 2013-12-20 ENCOUNTER — Telehealth: Payer: Self-pay | Admitting: *Deleted

## 2013-12-20 ENCOUNTER — Telehealth: Payer: Self-pay | Admitting: Family Medicine

## 2013-12-20 LAB — CBC WITH DIFFERENTIAL/PLATELET
BASOS ABS: 0 10*3/uL (ref 0.0–0.1)
Basophils Relative: 0 % (ref 0–1)
Eosinophils Absolute: 1.4 10*3/uL — ABNORMAL HIGH (ref 0.0–0.7)
Eosinophils Relative: 14 % — ABNORMAL HIGH (ref 0–5)
HCT: 37.1 % (ref 36.0–46.0)
Hemoglobin: 12.3 g/dL (ref 12.0–15.0)
LYMPHS ABS: 1.4 10*3/uL (ref 0.7–4.0)
Lymphocytes Relative: 14 % (ref 12–46)
MCH: 30.1 pg (ref 26.0–34.0)
MCHC: 33.2 g/dL (ref 30.0–36.0)
MCV: 90.7 fL (ref 78.0–100.0)
Monocytes Absolute: 0.6 10*3/uL (ref 0.1–1.0)
Monocytes Relative: 6 % (ref 3–12)
NEUTROS PCT: 66 % (ref 43–77)
Neutro Abs: 6.5 10*3/uL (ref 1.7–7.7)
PLATELETS: 292 10*3/uL (ref 150–400)
RBC: 4.09 MIL/uL (ref 3.87–5.11)
RDW: 16.2 % — AB (ref 11.5–15.5)
WBC: 9.9 10*3/uL (ref 4.0–10.5)

## 2013-12-20 NOTE — Telephone Encounter (Signed)
Requesting results of study done yesterday

## 2013-12-20 NOTE — Telephone Encounter (Signed)
Result Letter printed & faxed to [facility]; pt's daughter informed, understood & agreed, reports patient did recently have fall/SLS

## 2013-12-20 NOTE — Telephone Encounter (Signed)
SEE RESULT NOTE & PHONE NOTE

## 2013-12-20 NOTE — Telephone Encounter (Signed)
Message copied by Rockwell Germany on Fri Dec 20, 2013  2:34 PM ------      Message from: Raiford Noble      Created: Thu Dec 19, 2013  4:57 PM       Nursing Home and daughter need to be contacted -- X-ray shows mild-moderate stool burden which can be contributing to her symptoms.  Make sure to keep up with miralax and good fluid intake.  There is a place noted on her ribs that could be evidence of a healing fracture.  Has patient had recent fall or injury? If not, we may need to repeat X-ray in a few weeks or obtain a CT scan to further assess area. Patient has diagnosis of osteoporosis so healing fracture is most likely. ------

## 2013-12-24 DIAGNOSIS — R140 Abdominal distension (gaseous): Secondary | ICD-10-CM | POA: Insufficient documentation

## 2013-12-24 NOTE — Assessment & Plan Note (Signed)
Endorsed.  No distention on examination.  Questionable tenderness -- more so patient giving what she thinks is a desired response as a result of her dementia.  Will obtain CBC, CMP and DG abdomen.  Will have SNF obtain urine as we were not able to obtain sufficient sample in office.  Increase fluid intake.  Daily probiotic. Follow-up with PCP if symptoms are not improving.  Of note, there seems to be discord between patient's daughter and the nursing staff that is present for visit.  Staff states that the patient has not expressed any complaints to them.  Endorses patient seems to exhibit attention-seeking behavior when daughter visits.

## 2013-12-26 ENCOUNTER — Encounter: Payer: Self-pay | Admitting: Vascular Surgery

## 2013-12-27 ENCOUNTER — Ambulatory Visit (HOSPITAL_COMMUNITY)
Admission: RE | Admit: 2013-12-27 | Discharge: 2013-12-27 | Disposition: A | Discharge status: Home / Self Care | Payer: Medicare Other | Source: Ambulatory Visit | Attending: Vascular Surgery | Admitting: Vascular Surgery

## 2013-12-27 ENCOUNTER — Ambulatory Visit (INDEPENDENT_AMBULATORY_CARE_PROVIDER_SITE_OTHER): Payer: Medicare Other | Admitting: Vascular Surgery

## 2013-12-27 ENCOUNTER — Encounter: Payer: Self-pay | Admitting: Family Medicine

## 2013-12-27 ENCOUNTER — Encounter: Payer: Self-pay | Admitting: Vascular Surgery

## 2013-12-27 VITALS — BP 141/64 | HR 76 | Ht 59.0 in | Wt 98.0 lb

## 2013-12-27 DIAGNOSIS — I739 Peripheral vascular disease, unspecified: Secondary | ICD-10-CM | POA: Insufficient documentation

## 2013-12-27 DIAGNOSIS — I70219 Atherosclerosis of native arteries of extremities with intermittent claudication, unspecified extremity: Secondary | ICD-10-CM

## 2013-12-27 NOTE — Progress Notes (Signed)
   Established Intermittent Claudication   History of Present Illness  Rita Miller is a 80 y.o. female  who presents with chief complaint: no complaints. The patient continues to be wheelchair bound, transferring to bathroom with a walker.  Conversation was difficult today.  But to questioning she denies intermittent claudication or rest pain symptoms. The patient's symptoms have not progressed. The patient's symptoms are: none at rest.  The patient's treatment regimen currently included: maximal medical management.    Past Medical History, Past Surgical History, Social History, Family History, Medications, Allergies, and Review of Systems are unchanged from previous evaluation on 12/21/12.   On ROS: patient notes cold left foot , no ulcers  Physical Examination  Filed Vitals:   12/27/13 1415  BP: 141/64  Pulse: 76  Height: 4\' 11"  (1.499 m)  Weight: 98 lb (44.453 kg)  SpO2: 100%   Body mass index is 19.78 kg/(m^2).  General: A&O x 3, WDWN   Pulmonary: Sym exp, good air movt, CTAB, no rales, rhonchi, & wheezing   Cardiac: RRR, Nl S1, S2, no Murmurs, rubs or gallops   Vascular:  Vessel  Right  Left   Radial  Palpable  Palpable   Brachial  Palpable  Palpable   Carotid  Palpable, without bruit  Palpable, without bruit   Aorta  Not palpable  N/A   Femoral  Palpable  Palpable   Popliteal  Not palpable  Not palpable   PT  Palpable  Not Palpable   DP  Palpable  Faintly Palpable    Musculoskeletal: M/S 5/5 throughout , Extremities without ischemic changes , B feet without ulcers or gangrene   Neurologic: Pain and light touch intact in extremities , Motor exam as listed above   Non-Invasive Vascular Imaging  ABI (Date: 12/27/2013)  R: 1.21 (1.05), DP: bi, PT: bi, TBI: 0.57  L: 0.64 (0.58), DP: mono, PT: mono, TBI: 0.24  Medical Decision Making  Rita Miller is a 80 y.o. female who presents with: history of L leg intermittent claudication without evidence of critical limb  ischemia, possible dementia   Conversation with the patient was more challenging today, raising some concerns of progression in her dementia.  As per my previous discussions with family, we have agreed to limit any interventions on this patient due her multiple co-morbidities.   Based on the patient's vascular studies and examination, I have offered the patient: annual surveillance with ABI.   Thank you for allowing [place] to participate in this patient's care.  Adele Barthel, MD [facility] Office: [phone] Pager: [phone]  12/27/2013, 2:42 PM

## 2013-12-27 NOTE — Addendum Note (Signed)
Addended by: Mena Goes on: 12/27/2013 02:56 PM   Modules accepted: Orders

## 2013-12-31 ENCOUNTER — Telehealth: Payer: Self-pay

## 2013-12-31 MED ORDER — ALBUTEROL SULFATE HFA 108 (90 BASE) MCG/ACT IN AERS: 2.0000 | INHALATION_SPRAY | Freq: Four times a day (QID) | RESPIRATORY_TRACT | Status: AC | PRN

## 2013-12-31 NOTE — Telephone Encounter (Signed)
Received paperwork from Dr Marciano Sequin (Podiatrist). Per md we don't have a dx in chart to cover this and md wants to know if facility asked for this?  I called [facility] and left a message with the front staff to have nurse return my call

## 2013-12-31 NOTE — Telephone Encounter (Signed)
OK to send in Albuterol HFA 1-2 puffs po q 6 hours prn sob, wheeze, disp #1 with 3 rf

## 2013-12-31 NOTE — Telephone Encounter (Signed)
Patients daughter left a message stating that Rita Miller had told her that he would send in Albuterol for pt and the facility is stating that this has not been called in?  Pts daughter states that pt is staying with her?  Please advise?

## 2013-12-31 NOTE — Telephone Encounter (Signed)
[employer] called back. I asked if the facility is requesting this and the nurse states no. She will ask patient daughter if she is requesting this.

## 2013-12-31 NOTE — Telephone Encounter (Signed)
fyi

## 2013-12-31 NOTE — Telephone Encounter (Signed)
pts daughter informed that this was sent to pharmacy

## 2014-01-02 ENCOUNTER — Ambulatory Visit: Payer: Medicare Other | Admitting: Family Medicine

## 2014-01-03 ENCOUNTER — Ambulatory Visit: Payer: Medicare Other | Admitting: Physician Assistant

## 2014-01-03 ENCOUNTER — Emergency Department (HOSPITAL_BASED_OUTPATIENT_CLINIC_OR_DEPARTMENT_OTHER): Payer: Medicare Other

## 2014-01-03 ENCOUNTER — Emergency Department (HOSPITAL_BASED_OUTPATIENT_CLINIC_OR_DEPARTMENT_OTHER)
Admission: EM | Admit: 2014-01-03 | Discharge: 2014-01-03 | Disposition: A | Discharge status: Home / Self Care | Payer: Medicare Other | Attending: Emergency Medicine | Admitting: Emergency Medicine

## 2014-01-03 ENCOUNTER — Encounter (HOSPITAL_BASED_OUTPATIENT_CLINIC_OR_DEPARTMENT_OTHER): Payer: Self-pay | Admitting: Emergency Medicine

## 2014-01-03 DIAGNOSIS — Z8673 Personal history of transient ischemic attack (TIA), and cerebral infarction without residual deficits: Secondary | ICD-10-CM | POA: Insufficient documentation

## 2014-01-03 DIAGNOSIS — IMO0002 Reserved for concepts with insufficient information to code with codable children: Secondary | ICD-10-CM | POA: Insufficient documentation

## 2014-01-03 DIAGNOSIS — I10 Essential (primary) hypertension: Secondary | ICD-10-CM | POA: Insufficient documentation

## 2014-01-03 DIAGNOSIS — N390 Urinary tract infection, site not specified: Secondary | ICD-10-CM | POA: Insufficient documentation

## 2014-01-03 DIAGNOSIS — J4489 Other specified chronic obstructive pulmonary disease: Secondary | ICD-10-CM | POA: Insufficient documentation

## 2014-01-03 DIAGNOSIS — I4891 Unspecified atrial fibrillation: Secondary | ICD-10-CM | POA: Insufficient documentation

## 2014-01-03 DIAGNOSIS — E039 Hypothyroidism, unspecified: Secondary | ICD-10-CM | POA: Insufficient documentation

## 2014-01-03 DIAGNOSIS — Z950 Presence of cardiac pacemaker: Secondary | ICD-10-CM | POA: Insufficient documentation

## 2014-01-03 DIAGNOSIS — J449 Chronic obstructive pulmonary disease, unspecified: Secondary | ICD-10-CM | POA: Insufficient documentation

## 2014-01-03 DIAGNOSIS — Z86711 Personal history of pulmonary embolism: Secondary | ICD-10-CM | POA: Insufficient documentation

## 2014-01-03 DIAGNOSIS — I2510 Atherosclerotic heart disease of native coronary artery without angina pectoris: Secondary | ICD-10-CM | POA: Insufficient documentation

## 2014-01-03 DIAGNOSIS — Z862 Personal history of diseases of the blood and blood-forming organs and certain disorders involving the immune mechanism: Secondary | ICD-10-CM | POA: Insufficient documentation

## 2014-01-03 DIAGNOSIS — Z792 Long term (current) use of antibiotics: Secondary | ICD-10-CM | POA: Insufficient documentation

## 2014-01-03 DIAGNOSIS — Z853 Personal history of malignant neoplasm of breast: Secondary | ICD-10-CM | POA: Insufficient documentation

## 2014-01-03 DIAGNOSIS — K219 Gastro-esophageal reflux disease without esophagitis: Secondary | ICD-10-CM

## 2014-01-03 DIAGNOSIS — K5900 Constipation, unspecified: Secondary | ICD-10-CM

## 2014-01-03 DIAGNOSIS — Z7901 Long term (current) use of anticoagulants: Secondary | ICD-10-CM | POA: Insufficient documentation

## 2014-01-03 DIAGNOSIS — Z79899 Other long term (current) drug therapy: Secondary | ICD-10-CM | POA: Insufficient documentation

## 2014-01-03 DIAGNOSIS — E785 Hyperlipidemia, unspecified: Secondary | ICD-10-CM | POA: Insufficient documentation

## 2014-01-03 LAB — COMPREHENSIVE METABOLIC PANEL
ALT: 11 U/L (ref 0–35)
AST: 23 U/L (ref 0–37)
Albumin: 3.4 g/dL — ABNORMAL LOW (ref 3.5–5.2)
Alkaline Phosphatase: 70 U/L (ref 39–117)
BILIRUBIN TOTAL: 0.7 mg/dL (ref 0.3–1.2)
BUN: 23 mg/dL (ref 6–23)
CO2: 24 mEq/L (ref 19–32)
CREATININE: 1.1 mg/dL (ref 0.50–1.10)
Calcium: 9.2 mg/dL (ref 8.4–10.5)
Chloride: 106 mEq/L (ref 96–112)
GFR calc non Af Amer: 46 mL/min — ABNORMAL LOW (ref >90)
GFR, EST AFRICAN AMERICAN: 53 mL/min — AB (ref >90)
GLUCOSE: 96 mg/dL (ref 70–99)
POTASSIUM: 4.1 meq/L (ref 3.7–5.3)
Sodium: 142 mEq/L (ref 137–147)
Total Protein: 7.8 g/dL (ref 6.0–8.3)

## 2014-01-03 LAB — CBC WITH DIFFERENTIAL/PLATELET
BASOS ABS: 0.1 10*3/uL (ref 0.0–0.1)
Basophils Relative: 1 % (ref 0–1)
Eosinophils Absolute: 0.6 10*3/uL (ref 0.0–0.7)
Eosinophils Relative: 12 % — ABNORMAL HIGH (ref 0–5)
HCT: 33.5 % — ABNORMAL LOW (ref 36.0–46.0)
Hemoglobin: 10.9 g/dL — ABNORMAL LOW (ref 12.0–15.0)
LYMPHS PCT: 23 % (ref 12–46)
Lymphs Abs: 1.2 10*3/uL (ref 0.7–4.0)
MCH: 32 pg (ref 26.0–34.0)
MCHC: 32.5 g/dL (ref 30.0–36.0)
MCV: 98.2 fL (ref 78.0–100.0)
Monocytes Absolute: 0.3 10*3/uL (ref 0.1–1.0)
Monocytes Relative: 6 % (ref 3–12)
NEUTROS ABS: 3.1 10*3/uL (ref 1.7–7.7)
NEUTROS PCT: 58 % (ref 43–77)
PLATELETS: 264 10*3/uL (ref 150–400)
RBC: 3.41 MIL/uL — AB (ref 3.87–5.11)
RDW: 17.4 % — AB (ref 11.5–15.5)
WBC: 5.3 10*3/uL (ref 4.0–10.5)

## 2014-01-03 LAB — URINALYSIS, ROUTINE W REFLEX MICROSCOPIC
Bilirubin Urine: NEGATIVE
Glucose, UA: NEGATIVE mg/dL
HGB URINE DIPSTICK: NEGATIVE
Ketones, ur: NEGATIVE mg/dL
NITRITE: POSITIVE — AB
Protein, ur: 30 mg/dL — AB
Specific Gravity, Urine: 1.017 (ref 1.005–1.030)
UROBILINOGEN UA: 2 mg/dL — AB (ref 0.0–1.0)
pH: 7 (ref 5.0–8.0)

## 2014-01-03 LAB — TROPONIN I

## 2014-01-03 LAB — URINE MICROSCOPIC-ADD ON

## 2014-01-03 LAB — I-STAT CG4 LACTIC ACID, ED: LACTIC ACID, VENOUS: 0.54 mmol/L (ref 0.5–2.2)

## 2014-01-03 LAB — LIPASE, BLOOD: Lipase: 21 U/L (ref 11–59)

## 2014-01-03 MED ORDER — METOCLOPRAMIDE HCL 10 MG PO TABS: 10.0000 mg | ORAL_TABLET | Freq: Four times a day (QID) | ORAL | Status: DC

## 2014-01-03 MED ORDER — CEFTRIAXONE SODIUM 1 G IJ SOLR
INTRAMUSCULAR | Status: AC
  Filled 2014-01-03: qty 10

## 2014-01-03 MED ORDER — CEFTRIAXONE SODIUM 1 G IJ SOLR
500.0000 mg | INTRAMUSCULAR | Status: DC
  Administered 2014-01-03: 500 mg via INTRAMUSCULAR

## 2014-01-03 MED ORDER — SODIUM CHLORIDE 0.9 % IV BOLUS (SEPSIS): 500.0000 mL | Freq: Once | INTRAVENOUS | Status: DC

## 2014-01-03 MED ORDER — PANTOPRAZOLE SODIUM 40 MG PO TBEC
40.0000 mg | DELAYED_RELEASE_TABLET | Freq: Every day | ORAL | Status: DC
Start: 2014-01-03 — End: 2014-01-17

## 2014-01-03 MED ORDER — CEPHALEXIN 250 MG PO CAPS: 250.0000 mg | ORAL_CAPSULE | Freq: Four times a day (QID) | ORAL | Status: DC

## 2014-01-03 MED ORDER — LIDOCAINE HCL (PF) 1 % IJ SOLN
INTRAMUSCULAR | Status: AC
  Administered 2014-01-03: 2 mL
  Filled 2014-01-03: qty 5

## 2014-01-03 NOTE — ED Notes (Signed)
Report to Laurette Schimke, RN - to assume care of patient.

## 2014-01-03 NOTE — ED Notes (Signed)
Spoke to [place], patients Nurse at facility - She states over the past day patient has experienced increased difficulty swallowing with excessive saliva production. Daughter request ED visit. EDP notified.

## 2014-01-03 NOTE — ED Notes (Signed)
EDP states to hold discharge until UA results.

## 2014-01-03 NOTE — ED Provider Notes (Signed)
CSN: [id number]     Arrival date & time 01/03/14  1053 History   First MD Initiated Contact with Patient 01/03/14 1146     Chief Complaint  Patient presents with  . Emesis      HPI Daughter reports mother has had nausea,vomiting (started this morning), pt with lots of abdominal complaints and confusion over the last month and seen by PCP. Pt with history of dementia. Pt reports pain to throat and small amounts of white phlegm/saliva production noted in triage Patient has long history of vomiting.  Abdominal series done recently which showed some stool but otherwise nonobstructive pattern.  There was a question of some abnormality on chest x-ray.  There is no documented history of being recent fevers.  Patient is not complaining of abdominal pain. Past Medical History  Diagnosis Date  . CAD (coronary artery disease)   . History of CVA (cerebrovascular accident)   . Atrial fibrillation     peristent  . Hyperlipemia   . COPD (chronic obstructive pulmonary disease)   . Epistaxis     recurrent  . Osteoporosis   . Hypothyroidism   . History of pulmonary embolism   . History of colonic diverticulitis   . History of breast cancer   . Asthma   . Iron deficiency anemia   . History of transfusion of whole blood   . LBBB (left bundle branch block)     history of  . Aortal stenosis     severe  . Pacemaker     bradycardia s/p pacemaker implant  . PAD (peripheral artery disease)     left leg occluded viabahn stents x 3 left SFA  . Tachycardia-bradycardia syndrome     s/p PPM  . Cancer   . Arthritis   . Stroke   . HTN (hypertension) 09/25/2012  . HTN (hypertension) 09/25/2012   Past Surgical History  Procedure Laterality Date  . Thyroidectomy      due to cancer  . Total hip arthroplasty    . Abdominal hysterectomy    . Mastectomy    . Mitral valve repair    . Intraocular lens insertion      left eye implant  . Pacemaker placement      for tachy/brady syndrome   Family History   Problem Relation Age of Onset  . Breast cancer    . Colon cancer    . Diabetes    . Hyperlipidemia    . Hypertension    . Heart disease Mother     unknown heart disease   History  Substance Use Topics  . Smoking status: Never Smoker   . Smokeless tobacco: Never Used  . Alcohol Use: No   OB History   Grav Para Term Preterm Abortions TAB SAB Ect Mult Living                 Review of Systems  All other systems reviewed and are negative.     Allergies  Donepezil hydrochloride; Morphine sulfate; Oxycodone-acetaminophen; and Percocet  Home Medications   Prior to Admission medications   Medication Sig Start Date End Date Taking? Authorizing Provider  acetaminophen (TYLENOL) 500 MG tablet 2 tabs po bid x 14 days then as needed for pain bid after that 12/03/12  Yes Mosie Lukes, MD  albuterol (PROVENTIL HFA;VENTOLIN HFA) 108 (90 BASE) MCG/ACT inhaler Inhale 2 puffs into the lungs every 6 (six) hours as needed for wheezing or shortness of breath. 12/31/13  Yes Erline Levine A  Charlett Blake, MD  cefdinir (OMNICEF) 300 MG capsule  11/22/13  Yes Historical Provider, MD  CRESTOR 20 MG tablet  11/15/13  Yes Historical Provider, MD  diltiazem (CARDIZEM CD) 240 MG 24 hr capsule Take 240 mg by mouth daily.    Yes Historical Provider, MD  ergocalciferol (VITAMIN D2) 50000 UNITS capsule Take 50,000 Units by mouth once a week.   Yes Historical Provider, MD  fluticasone (FLOVENT HFA) 44 MCG/ACT inhaler Inhale 1 puff into the lungs 2 (two) times daily.     Yes Historical Provider, MD  JANTOVEN 3 MG tablet  10/05/13  Yes Historical Provider, MD  levothyroxine (SYNTHROID, LEVOTHROID) 75 MCG tablet Take 1 tablet (75 mcg total) by mouth daily. 08/02/13  Yes Mosie Lukes, MD  Melatonin 3 MG TABS Take 3 mg by mouth at bedtime as needed.    Yes Historical Provider, MD  memantine (NAMENDA) 10 MG tablet Take 10 mg by mouth daily.   Yes Historical Provider, MD  nitrofurantoin, macrocrystal-monohydrate, (MACROBID) 100  MG capsule  12/09/13  Yes Historical Provider, MD  nystatin cream (MYCOSTATIN) Apply 1 application topically 3 (three) times daily. Apply under left breast   Yes Historical Provider, MD  omeprazole (PRILOSEC) 20 MG capsule 20 mg. Take one capsule daily. Take on and empty stomach- Do no crush. 05/15/11  Yes Historical Provider, MD  polyethylene glycol (MIRALAX / GLYCOLAX) packet Take 17 g by mouth daily. Mix 17gm in 6-8 ounces of liquid daily.    Yes Historical Provider, MD  pravastatin (PRAVACHOL) 20 MG tablet Take 1 tablet (20 mg total) by mouth daily. 11/26/13  Yes Mosie Lukes, MD  Rivastigmine (EXELON) 13.3 MG/24HR PT24 Place 1 patch onto the skin daily.   Yes Historical Provider, MD  SALINE NASAL SPRAY NA Place 0.65 % into the nose. One spray in each nostril twice a day for nose bleeds    Yes Historical Provider, MD  tiotropium (SPIRIVA) 18 MCG inhalation capsule Place 18 mcg into inhaler and inhale daily.     Yes Historical Provider, MD  vitamin E 400 UNIT capsule Take 400 Units by mouth daily.     Yes Historical Provider, MD  warfarin (COUMADIN) 2 MG tablet Take 2 mg by mouth as directed. As directed 2 mg on Wed and Sat 3 mg Mon, Tues, Thurs, Fri and Sun 02/02/12  Yes Historical Provider, MD  cephALEXin (KEFLEX) 250 MG capsule Take 1 capsule (250 mg total) by mouth 4 (four) times daily. 01/03/14   Dot Lanes, MD  metoCLOPramide (REGLAN) 10 MG tablet Take 1 tablet (10 mg total) by mouth every 6 (six) hours. 01/03/14   Dot Lanes, MD  pantoprazole (PROTONIX) 40 MG tablet Take 1 tablet (40 mg total) by mouth daily. 01/03/14   Dot Lanes, MD   BP 147/54  Pulse 71  Temp(Src) 97.8 F (36.6 C) (Oral)  Resp 18  Ht 5\' 4"  (1.626 m)  Wt 98 lb (44.453 kg)  BMI 16.81 kg/m2  SpO2 96% Physical Exam  Nursing note and vitals reviewed. Constitutional: She appears well-developed and well-nourished. No distress.  HENT:  Head: Normocephalic and atraumatic.  Eyes: Pupils are equal, round, and  reactive to light.  Neck: Normal range of motion.  Cardiovascular: Normal rate and intact distal pulses.   Pulmonary/Chest: No respiratory distress.  Abdominal: Normal appearance. She exhibits no distension. There is no tenderness. There is no rebound.  Musculoskeletal: Normal range of motion.  Neurological: She is alert. No cranial nerve  deficit.  Skin: Skin is warm and dry. No rash noted.  Psychiatric: She has a normal mood and affect. Her behavior is normal.    ED Course  Procedures (including critical care time)  Medications  cefTRIAXone (ROCEPHIN) injection 500 mg (not administered)    Labs Review Labs Reviewed  COMPREHENSIVE METABOLIC PANEL - Abnormal; Notable for the following:    Albumin 3.4 (*)    GFR calc non Af Amer 46 (*)    GFR calc Af Amer 53 (*)    All other components within normal limits  CBC WITH DIFFERENTIAL - Abnormal; Notable for the following:    RBC 3.41 (*)    Hemoglobin 10.9 (*)    HCT 33.5 (*)    RDW 17.4 (*)    Eosinophils Relative 12 (*)    All other components within normal limits  URINALYSIS, ROUTINE W REFLEX MICROSCOPIC - Abnormal; Notable for the following:    APPearance CLOUDY (*)    Protein, ur 30 (*)    Urobilinogen, UA 2.0 (*)    Nitrite POSITIVE (*)    Leukocytes, UA MODERATE (*)    All other components within normal limits  URINE MICROSCOPIC-ADD ON - Abnormal; Notable for the following:    Bacteria, UA MANY (*)    All other components within normal limits  URINE CULTURE  LIPASE, BLOOD  TROPONIN I  I-STAT CG4 LACTIC ACID, ED  I-STAT CG4 LACTIC ACID, ED    Imaging Review Dg Chest 2 View  01/03/2014   CLINICAL DATA:  Nausea and vomiting  EXAM: CHEST  2 VIEW  COMPARISON:  12/03/2012  FINDINGS: Pacing device is again seen and stable. The cardiac shadow is mildly enlarged but stable. Scarring is noted within the lungs bilaterally. It is increased slightly in the lateral aspect of the right upper lobe somewhat obscured by the pacing  pack. Hyperinflation is again seen. No focal infiltrate or sizable effusion is noted. No acute bony abnormality is seen.  IMPRESSION: Increased scarring bilaterally particularly in the right upper lobe.   Electronically Signed   By: Inez Catalina M.D.   On: 01/03/2014 13:12   Dg Abd 2 Views  01/03/2014   CLINICAL DATA:  Abdominal pain.  Nausea and vomiting.  EXAM: ABDOMEN - 2 VIEW  COMPARISON:  12/19/2013  FINDINGS: No evidence dilated bowel loops. Moderate to large stool burden noted throughout the majority colon. Diffuse atherosclerotic calcification of abdominal aorta and iliac arteries noted. No evidence free air.  IMPRESSION: Nonobstructive bowel gas pattern. Moderate to large stool burden noted.   Electronically Signed   By: Earle Gell M.D.   On: 01/03/2014 13:07     EKG Interpretation   Date/Time:  Friday Jan 03 2014 11:42:05 EDT Ventricular Rate:  70 PR Interval:  290 QRS Duration: 186 QT Interval:  520 QTC Calculation: 561 R Axis:   -75 Text Interpretation:  Atrial-sensed ventricular-paced rhythm with  prolonged AV conduction Abnormal ECG Confirmed by Owynn Mosqueda  MD, Azile Minardi  ([id number]) on 01/03/2014 12:01:30 PM      MDM   Final diagnoses:  Constipation  GERD (gastroesophageal reflux disease)  UTI (lower urinary tract infection)        Dot Lanes, MD 01/03/14 1510

## 2014-01-03 NOTE — ED Notes (Signed)
[facility] called for transport back to facility.

## 2014-01-03 NOTE — ED Notes (Addendum)
Daughter reports mother has had nausea,vomiting (started this morning), pt with lots of abdominal complaints and confusion over the last month and seen by PCP. Pt with history of dementia. Pt reports pain to throat, also points to center of chest when asked about pain, and small amounts of white phlegm/saliva production noted in triage. EKG obtained.

## 2014-01-03 NOTE — ED Notes (Signed)
[place] called for update and report. Pt's daughter at bedside, aware of rxtions and discharge instructions as well.

## 2014-01-03 NOTE — ED Notes (Signed)
Unable to obtain IV access - EDP notified - EDP to bedside to obtain labs via Femoral Artery. No bleeding noted to site.

## 2014-01-06 ENCOUNTER — Emergency Department (HOSPITAL_COMMUNITY)
Admission: EM | Admit: 2014-01-06 | Discharge: 2014-01-07 | Disposition: A | Discharge status: Home / Self Care | Payer: Medicare Other | Source: Home / Self Care | Attending: Emergency Medicine | Admitting: Emergency Medicine

## 2014-01-06 ENCOUNTER — Emergency Department (HOSPITAL_COMMUNITY): Payer: Medicare Other

## 2014-01-06 ENCOUNTER — Encounter (HOSPITAL_COMMUNITY): Payer: Self-pay | Admitting: Emergency Medicine

## 2014-01-06 DIAGNOSIS — R5383 Other fatigue: Secondary | ICD-10-CM | POA: Insufficient documentation

## 2014-01-06 DIAGNOSIS — Z7901 Long term (current) use of anticoagulants: Secondary | ICD-10-CM | POA: Insufficient documentation

## 2014-01-06 DIAGNOSIS — R531 Weakness: Secondary | ICD-10-CM

## 2014-01-06 DIAGNOSIS — Z86711 Personal history of pulmonary embolism: Secondary | ICD-10-CM

## 2014-01-06 DIAGNOSIS — Z79899 Other long term (current) drug therapy: Secondary | ICD-10-CM

## 2014-01-06 DIAGNOSIS — E039 Hypothyroidism, unspecified: Secondary | ICD-10-CM | POA: Insufficient documentation

## 2014-01-06 DIAGNOSIS — I10 Essential (primary) hypertension: Secondary | ICD-10-CM

## 2014-01-06 DIAGNOSIS — I4891 Unspecified atrial fibrillation: Secondary | ICD-10-CM

## 2014-01-06 DIAGNOSIS — M810 Age-related osteoporosis without current pathological fracture: Secondary | ICD-10-CM

## 2014-01-06 DIAGNOSIS — I739 Peripheral vascular disease, unspecified: Secondary | ICD-10-CM

## 2014-01-06 DIAGNOSIS — I2510 Atherosclerotic heart disease of native coronary artery without angina pectoris: Secondary | ICD-10-CM | POA: Insufficient documentation

## 2014-01-06 DIAGNOSIS — Z8679 Personal history of other diseases of the circulatory system: Secondary | ICD-10-CM

## 2014-01-06 DIAGNOSIS — Z950 Presence of cardiac pacemaker: Secondary | ICD-10-CM

## 2014-01-06 DIAGNOSIS — Z8719 Personal history of other diseases of the digestive system: Secondary | ICD-10-CM

## 2014-01-06 DIAGNOSIS — IMO0002 Reserved for concepts with insufficient information to code with codable children: Secondary | ICD-10-CM | POA: Insufficient documentation

## 2014-01-06 DIAGNOSIS — Z8673 Personal history of transient ischemic attack (TIA), and cerebral infarction without residual deficits: Secondary | ICD-10-CM

## 2014-01-06 DIAGNOSIS — R5381 Other malaise: Secondary | ICD-10-CM

## 2014-01-06 DIAGNOSIS — J4489 Other specified chronic obstructive pulmonary disease: Secondary | ICD-10-CM | POA: Insufficient documentation

## 2014-01-06 DIAGNOSIS — Z853 Personal history of malignant neoplasm of breast: Secondary | ICD-10-CM | POA: Insufficient documentation

## 2014-01-06 DIAGNOSIS — Z862 Personal history of diseases of the blood and blood-forming organs and certain disorders involving the immune mechanism: Secondary | ICD-10-CM | POA: Insufficient documentation

## 2014-01-06 DIAGNOSIS — G8190 Hemiplegia, unspecified affecting unspecified side: Secondary | ICD-10-CM

## 2014-01-06 DIAGNOSIS — J449 Chronic obstructive pulmonary disease, unspecified: Secondary | ICD-10-CM

## 2014-01-06 LAB — CBC
HCT: 34.2 % — ABNORMAL LOW (ref 36.0–46.0)
HEMOGLOBIN: 11 g/dL — AB (ref 12.0–15.0)
MCH: 31 pg (ref 26.0–34.0)
MCHC: 32.2 g/dL (ref 30.0–36.0)
MCV: 96.3 fL (ref 78.0–100.0)
Platelets: 242 10*3/uL (ref 150–400)
RBC: 3.55 MIL/uL — ABNORMAL LOW (ref 3.87–5.11)
RDW: 17.4 % — AB (ref 11.5–15.5)
WBC: 5.7 10*3/uL (ref 4.0–10.5)

## 2014-01-06 LAB — I-STAT CHEM 8, ED
BUN: 25 mg/dL — AB (ref 6–23)
CREATININE: 1.3 mg/dL — AB (ref 0.50–1.10)
Calcium, Ion: 1.24 mmol/L (ref 1.13–1.30)
Chloride: 102 mEq/L (ref 96–112)
Glucose, Bld: 123 mg/dL — ABNORMAL HIGH (ref 70–99)
HEMATOCRIT: 37 % (ref 36.0–46.0)
Hemoglobin: 12.6 g/dL (ref 12.0–15.0)
POTASSIUM: 3.7 meq/L (ref 3.7–5.3)
Sodium: 144 mEq/L (ref 137–147)
TCO2: 24 mmol/L (ref 0–100)

## 2014-01-06 LAB — I-STAT TROPONIN, ED: Troponin i, poc: 0.01 ng/mL (ref 0.00–0.08)

## 2014-01-06 LAB — APTT: aPTT: 41 seconds — ABNORMAL HIGH (ref 24–37)

## 2014-01-06 LAB — DIFFERENTIAL
Basophils Absolute: 0.1 10*3/uL (ref 0.0–0.1)
Basophils Relative: 1 % (ref 0–1)
Eosinophils Absolute: 0.5 10*3/uL (ref 0.0–0.7)
Eosinophils Relative: 10 % — ABNORMAL HIGH (ref 0–5)
Lymphocytes Relative: 24 % (ref 12–46)
Lymphs Abs: 1.4 10*3/uL (ref 0.7–4.0)
MONO ABS: 0.4 10*3/uL (ref 0.1–1.0)
Monocytes Relative: 8 % (ref 3–12)
Neutro Abs: 3.3 10*3/uL (ref 1.7–7.7)
Neutrophils Relative %: 57 % (ref 43–77)

## 2014-01-06 LAB — COMPREHENSIVE METABOLIC PANEL
ALT: 10 U/L (ref 0–35)
AST: 22 U/L (ref 0–37)
Albumin: 3.3 g/dL — ABNORMAL LOW (ref 3.5–5.2)
Alkaline Phosphatase: 74 U/L (ref 39–117)
BILIRUBIN TOTAL: 0.6 mg/dL (ref 0.3–1.2)
BUN: 25 mg/dL — ABNORMAL HIGH (ref 6–23)
CHLORIDE: 105 meq/L (ref 96–112)
CO2: 26 meq/L (ref 19–32)
CREATININE: 1.15 mg/dL — AB (ref 0.50–1.10)
Calcium: 9.2 mg/dL (ref 8.4–10.5)
GFR calc Af Amer: 50 mL/min — ABNORMAL LOW (ref >90)
GFR, EST NON AFRICAN AMERICAN: 43 mL/min — AB (ref >90)
Glucose, Bld: 122 mg/dL — ABNORMAL HIGH (ref 70–99)
Potassium: 3.8 mEq/L (ref 3.7–5.3)
SODIUM: 141 meq/L (ref 137–147)
Total Protein: 8.2 g/dL (ref 6.0–8.3)

## 2014-01-06 LAB — CBG MONITORING, ED: Glucose-Capillary: 143 mg/dL — ABNORMAL HIGH (ref 70–99)

## 2014-01-06 LAB — PROTIME-INR
INR: 1.57 — ABNORMAL HIGH (ref 0.00–1.49)
Prothrombin Time: 18.3 seconds — ABNORMAL HIGH (ref 11.6–15.2)

## 2014-01-06 MED ORDER — SODIUM CHLORIDE 0.9 % IV BOLUS (SEPSIS)
1000.0000 mL | Freq: Once | INTRAVENOUS | Status: AC
  Administered 2014-01-06: 1000 mL via INTRAVENOUS

## 2014-01-06 NOTE — ED Notes (Signed)
CBG Taken = 143

## 2014-01-06 NOTE — ED Notes (Signed)
Dr. Delo at bedside. 

## 2014-01-06 NOTE — Discharge Instructions (Signed)

## 2014-01-06 NOTE — ED Provider Notes (Signed)
CSN: [id number]     Arrival date & time 01/06/14  2051 History   First MD Initiated Contact with Patient 01/06/14 2113     Chief Complaint  Patient presents with  . Stroke Symptoms     (Consider location/radiation/quality/duration/timing/severity/associated sxs/prior Treatment) HPI Comments: Patient is an 80 year old female with history of CAD, atrial fibrillation. She is currently a resident of an extended care facility. She was sent here for evaluation of weakness and concern of a CVA. The nursing home reports left-sided weakness that has been ongoing since this morning. According to the daughter, this has been present for much longer. The daughter did not want her brought to the ER, however she was eventually sent here due to insistence by the nursing home. Patient denies to me she is having any discomfort. She states that she feels well.  The history is provided by the patient.    Past Medical History  Diagnosis Date  . CAD (coronary artery disease)   . History of CVA (cerebrovascular accident)   . Atrial fibrillation     peristent  . Hyperlipemia   . COPD (chronic obstructive pulmonary disease)   . Epistaxis     recurrent  . Osteoporosis   . Hypothyroidism   . History of pulmonary embolism   . History of colonic diverticulitis   . History of breast cancer   . Asthma   . Iron deficiency anemia   . History of transfusion of whole blood   . LBBB (left bundle branch block)     history of  . Aortal stenosis     severe  . Pacemaker     bradycardia s/p pacemaker implant  . PAD (peripheral artery disease)     left leg occluded viabahn stents x 3 left SFA  . Tachycardia-bradycardia syndrome     s/p PPM  . Cancer   . Arthritis   . Stroke   . HTN (hypertension) 09/25/2012  . HTN (hypertension) 09/25/2012   Past Surgical History  Procedure Laterality Date  . Thyroidectomy      due to cancer  . Total hip arthroplasty    . Abdominal hysterectomy    . Mastectomy    . Mitral  valve repair    . Intraocular lens insertion      left eye implant  . Pacemaker placement      for tachy/brady syndrome   Family History  Problem Relation Age of Onset  . Breast cancer    . Colon cancer    . Diabetes    . Hyperlipidemia    . Hypertension    . Heart disease Mother     unknown heart disease   History  Substance Use Topics  . Smoking status: Never Smoker   . Smokeless tobacco: Never Used  . Alcohol Use: No   OB History   Grav Para Term Preterm Abortions TAB SAB Ect Mult Living                 Review of Systems  All other systems reviewed and are negative.     Allergies  Donepezil hydrochloride; Morphine sulfate; Oxycodone-acetaminophen; and Percocet  Home Medications   Prior to Admission medications   Medication Sig Start Date End Date Taking? Authorizing Provider  acetaminophen (TYLENOL) 500 MG tablet 2 tabs po bid x 14 days then as needed for pain bid after that 12/03/12  Yes Mosie Lukes, MD  albuterol (PROVENTIL HFA;VENTOLIN HFA) 108 (90 BASE) MCG/ACT inhaler Inhale 2 puffs into  the lungs every 6 (six) hours as needed for wheezing or shortness of breath. 12/31/13  Yes Mosie Lukes, MD  cefdinir (OMNICEF) 300 MG capsule Take 300 mg by mouth daily.  11/22/13  Yes Historical Provider, MD  diltiazem (CARDIZEM CD) 240 MG 24 hr capsule Take 240 mg by mouth daily.    Yes Historical Provider, MD  ergocalciferol (VITAMIN D2) 50000 UNITS capsule Take 50,000 Units by mouth once a week.   Yes Historical Provider, MD  cephALEXin (KEFLEX) 250 MG capsule Take 1 capsule (250 mg total) by mouth 4 (four) times daily. 01/03/14   Dot Lanes, MD  fluticasone (FLOVENT HFA) 44 MCG/ACT inhaler Inhale 1 puff into the lungs 2 (two) times daily.      Historical Provider, MD  JANTOVEN 3 MG tablet  10/05/13   Historical Provider, MD  levothyroxine (SYNTHROID, LEVOTHROID) 75 MCG tablet Take 1 tablet (75 mcg total) by mouth daily. 08/02/13   Mosie Lukes, MD  Melatonin 3 MG  TABS Take 3 mg by mouth at bedtime as needed.     Historical Provider, MD  memantine (NAMENDA) 10 MG tablet Take 10 mg by mouth daily.    Historical Provider, MD  metoCLOPramide (REGLAN) 10 MG tablet Take 1 tablet (10 mg total) by mouth every 6 (six) hours. 01/03/14   Dot Lanes, MD  nitrofurantoin, macrocrystal-monohydrate, (MACROBID) 100 MG capsule  12/09/13   Historical Provider, MD  nystatin cream (MYCOSTATIN) Apply 1 application topically 3 (three) times daily. Apply under left breast    Historical Provider, MD  omeprazole (PRILOSEC) 20 MG capsule 20 mg. Take one capsule daily. Take on and empty stomach- Do no crush. 05/15/11   Historical Provider, MD  pantoprazole (PROTONIX) 40 MG tablet Take 1 tablet (40 mg total) by mouth daily. 01/03/14   Dot Lanes, MD  polyethylene glycol [facility] / GLYCOLAX) packet Take 17 g by mouth daily. Mix 17gm in 6-8 ounces of liquid daily.     Historical Provider, MD  pravastatin (PRAVACHOL) 20 MG tablet Take 1 tablet (20 mg total) by mouth daily. 11/26/13   Mosie Lukes, MD  Rivastigmine (EXELON) 13.3 MG/24HR PT24 Place 1 patch onto the skin daily.    Historical Provider, MD  SALINE NASAL SPRAY NA Place 0.65 % into the nose. One spray in each nostril twice a day for nose bleeds     Historical Provider, MD  tiotropium (SPIRIVA) 18 MCG inhalation capsule Place 18 mcg into inhaler and inhale daily.      Historical Provider, MD  vitamin E 400 UNIT capsule Take 400 Units by mouth daily.      Historical Provider, MD  warfarin (COUMADIN) 2 MG tablet Take 2 mg by mouth as directed. As directed 2 mg on Wed and Sat 3 mg Mon, Tues, Thurs, Fri and Sun 02/02/12   Historical Provider, MD   BP 143/60  Pulse 69  Temp(Src) 97.4 F (36.3 C) (Oral)  Resp 16  SpO2 99% Physical Exam  Nursing note and vitals reviewed. Constitutional: She is oriented to person, place, and time. No distress.  Patient is a cachectic, 80 year old female in no acute distress.  HENT:  Head:  Normocephalic and atraumatic.  Mouth/Throat: Oropharynx is clear and moist.  Neck: Normal range of motion. Neck supple.  Cardiovascular: Normal rate and regular rhythm.  Exam reveals no gallop and no friction rub.   No murmur heard. Pulmonary/Chest: Effort normal and breath sounds normal. No respiratory distress. She has no  wheezes.  Abdominal: Soft. Bowel sounds are normal. She exhibits no distension. There is no tenderness.  Musculoskeletal: Normal range of motion.  Neurological: She is alert and oriented to person, place, and time.  There is a left-sided hemiparesis noted.  Skin: Skin is warm and dry.    ED Course  Procedures (including critical care time) Labs Review Labs Reviewed  PROTIME-INR - Abnormal; Notable for the following:    Prothrombin Time 18.3 (*)    INR 1.57 (*)    All other components within normal limits  APTT - Abnormal; Notable for the following:    aPTT 41 (*)    All other components within normal limits  CBC - Abnormal; Notable for the following:    RBC 3.55 (*)    Hemoglobin 11.0 (*)    HCT 34.2 (*)    RDW 17.4 (*)    All other components within normal limits  DIFFERENTIAL - Abnormal; Notable for the following:    Eosinophils Relative 10 (*)    All other components within normal limits  COMPREHENSIVE METABOLIC PANEL - Abnormal; Notable for the following:    Glucose, Bld 122 (*)    BUN 25 (*)    Creatinine, Ser 1.15 (*)    Albumin 3.3 (*)    GFR calc non Af Amer 43 (*)    GFR calc Af Amer 50 (*)    All other components within normal limits  CBG MONITORING, ED - Abnormal; Notable for the following:    Glucose-Capillary 143 (*)    All other components within normal limits  I-STAT CHEM 8, ED - Abnormal; Notable for the following:    BUN 25 (*)    Creatinine, Ser 1.30 (*)    Glucose, Bld 123 (*)    All other components within normal limits  I-STAT TROPOININ, ED    Imaging Review Ct Head (brain) Wo Contrast  01/06/2014   CLINICAL DATA:   Stroke-like symptoms left-sided weakness and slurred speech with facial droop.  EXAM: CT HEAD WITHOUT CONTRAST  TECHNIQUE: Contiguous axial images were obtained from the base of the skull through the vertex without intravenous contrast.  COMPARISON:  11/07/2009.  FINDINGS: Skull and Sinuses:No fracture destructive process. Gas posterior to the lower right maxillary sinus is likely within the oral cavity. There is circumferential mucosal thickening in the left maxillary sinus, with secretion retention.  Orbits: Bilateral cataract resection.  Brain: Extensive encephalomalacia and gliosis involving the right MCA territory, there is mild sparing at the [facility] border zones. There is no evidence of peri-infarct ischemia. Remote lacunar noted in the right caudate nucleus. There is a small vessel infarct in the upper left cerebellum, appearance and history compatible with remote injury. Ischemic gliosis which is confluent around the lateral ventricles. No evidence of hemorrhage, hydrocephalus, or mass lesion.  IMPRESSION: 1. No intracranial hemorrhage or acute infarct identified. 2. Remote right MCA territory infarct. 3. Left maxillary sinusitis.   Electronically Signed   By: Jorje Guild M.D.   On: 01/06/2014 22:50     EKG Interpretation   Date/Time:  Monday January 06 2014 21:02:37 EDT Ventricular Rate:  70 PR Interval:    QRS Duration: 190 QT Interval:  517 QTC Calculation: 558 R Axis:   -71 Text Interpretation:  Atrial fibrillation Ventricular paced rhythm  Confirmed by DELOS  MD, Raymund Manrique ([id number]) on 01/06/2014 11:48:12 PM      MDM   Final diagnoses:  None    80 year old female for evaluation of weakness and possible  CVA. CT of the head is unremarkable and laboratory studies provide no significant abnormalities. According to the daughter, she is at her baseline and the patient has no complaints. She will be hydrated with 1 L of normal saline and I believe she is appropriate for return to the  ECF.    Veryl Speak, MD 01/06/14 [phone]

## 2014-01-06 NOTE — ED Notes (Signed)
Pt from [facility] facility.  Per EMS, pt with stroke-like symptoms since 7 am this morning.  Pt noted to have left-sided weakness with slurred speech and facial droop.  Daughter initially refused for pt to be transported.  Night nurse came on at 7pm and called EMS to transport pt to ER due to continuation of symptoms.

## 2014-01-07 ENCOUNTER — Inpatient Hospital Stay (HOSPITAL_COMMUNITY): Payer: Medicare Other

## 2014-01-07 ENCOUNTER — Emergency Department (HOSPITAL_COMMUNITY): Payer: Medicare Other

## 2014-01-07 ENCOUNTER — Inpatient Hospital Stay (HOSPITAL_COMMUNITY)
Admission: EM | Admit: 2014-01-07 | Discharge: 2014-01-09 | DRG: 064 | Disposition: A | Discharge status: Skilled Nursing Facility | Payer: Medicare Other | Attending: Internal Medicine | Admitting: Internal Medicine

## 2014-01-07 ENCOUNTER — Encounter (HOSPITAL_COMMUNITY): Payer: Self-pay | Admitting: Emergency Medicine

## 2014-01-07 DIAGNOSIS — E43 Unspecified severe protein-calorie malnutrition: Secondary | ICD-10-CM | POA: Diagnosis present

## 2014-01-07 DIAGNOSIS — F0390 Unspecified dementia without behavioral disturbance: Secondary | ICD-10-CM | POA: Diagnosis present

## 2014-01-07 DIAGNOSIS — R413 Other amnesia: Secondary | ICD-10-CM

## 2014-01-07 DIAGNOSIS — J45909 Unspecified asthma, uncomplicated: Secondary | ICD-10-CM | POA: Diagnosis present

## 2014-01-07 DIAGNOSIS — Z853 Personal history of malignant neoplasm of breast: Secondary | ICD-10-CM

## 2014-01-07 DIAGNOSIS — Z515 Encounter for palliative care: Secondary | ICD-10-CM | POA: Diagnosis not present

## 2014-01-07 DIAGNOSIS — N644 Mastodynia: Secondary | ICD-10-CM

## 2014-01-07 DIAGNOSIS — Z8541 Personal history of malignant neoplasm of cervix uteri: Secondary | ICD-10-CM

## 2014-01-07 DIAGNOSIS — Z87891 Personal history of nicotine dependence: Secondary | ICD-10-CM

## 2014-01-07 DIAGNOSIS — Z7901 Long term (current) use of anticoagulants: Secondary | ICD-10-CM | POA: Diagnosis not present

## 2014-01-07 DIAGNOSIS — D509 Iron deficiency anemia, unspecified: Secondary | ICD-10-CM

## 2014-01-07 DIAGNOSIS — R2681 Unsteadiness on feet: Secondary | ICD-10-CM

## 2014-01-07 DIAGNOSIS — I69922 Dysarthria following unspecified cerebrovascular disease: Secondary | ICD-10-CM

## 2014-01-07 DIAGNOSIS — E039 Hypothyroidism, unspecified: Secondary | ICD-10-CM | POA: Diagnosis present

## 2014-01-07 DIAGNOSIS — E785 Hyperlipidemia, unspecified: Secondary | ICD-10-CM | POA: Diagnosis present

## 2014-01-07 DIAGNOSIS — Z954 Presence of other heart-valve replacement: Secondary | ICD-10-CM

## 2014-01-07 DIAGNOSIS — Z66 Do not resuscitate: Secondary | ICD-10-CM | POA: Diagnosis present

## 2014-01-07 DIAGNOSIS — N390 Urinary tract infection, site not specified: Secondary | ICD-10-CM | POA: Diagnosis present

## 2014-01-07 DIAGNOSIS — Z96649 Presence of unspecified artificial hip joint: Secondary | ICD-10-CM

## 2014-01-07 DIAGNOSIS — R17 Unspecified jaundice: Secondary | ICD-10-CM

## 2014-01-07 DIAGNOSIS — M810 Age-related osteoporosis without current pathological fracture: Secondary | ICD-10-CM

## 2014-01-07 DIAGNOSIS — R4789 Other speech disturbances: Secondary | ICD-10-CM | POA: Diagnosis present

## 2014-01-07 DIAGNOSIS — R1310 Dysphagia, unspecified: Secondary | ICD-10-CM | POA: Diagnosis present

## 2014-01-07 DIAGNOSIS — I2510 Atherosclerotic heart disease of native coronary artery without angina pectoris: Secondary | ICD-10-CM | POA: Diagnosis present

## 2014-01-07 DIAGNOSIS — F329 Major depressive disorder, single episode, unspecified: Secondary | ICD-10-CM

## 2014-01-07 DIAGNOSIS — I2782 Chronic pulmonary embolism: Secondary | ICD-10-CM | POA: Diagnosis present

## 2014-01-07 DIAGNOSIS — I495 Sick sinus syndrome: Secondary | ICD-10-CM

## 2014-01-07 DIAGNOSIS — Z950 Presence of cardiac pacemaker: Secondary | ICD-10-CM | POA: Diagnosis not present

## 2014-01-07 DIAGNOSIS — Z86718 Personal history of other venous thrombosis and embolism: Secondary | ICD-10-CM

## 2014-01-07 DIAGNOSIS — Z9889 Other specified postprocedural states: Secondary | ICD-10-CM

## 2014-01-07 DIAGNOSIS — W19XXXA Unspecified fall, initial encounter: Secondary | ICD-10-CM | POA: Diagnosis present

## 2014-01-07 DIAGNOSIS — Z8679 Personal history of other diseases of the circulatory system: Secondary | ICD-10-CM

## 2014-01-07 DIAGNOSIS — Z85850 Personal history of malignant neoplasm of thyroid: Secondary | ICD-10-CM | POA: Diagnosis not present

## 2014-01-07 DIAGNOSIS — Z8543 Personal history of malignant neoplasm of ovary: Secondary | ICD-10-CM

## 2014-01-07 DIAGNOSIS — Z8719 Personal history of other diseases of the digestive system: Secondary | ICD-10-CM

## 2014-01-07 DIAGNOSIS — I6350 Cerebral infarction due to unspecified occlusion or stenosis of unspecified cerebral artery: Secondary | ICD-10-CM | POA: Diagnosis not present

## 2014-01-07 DIAGNOSIS — I639 Cerebral infarction, unspecified: Secondary | ICD-10-CM | POA: Diagnosis present

## 2014-01-07 DIAGNOSIS — E781 Pure hyperglyceridemia: Secondary | ICD-10-CM

## 2014-01-07 DIAGNOSIS — I10 Essential (primary) hypertension: Secondary | ICD-10-CM | POA: Diagnosis present

## 2014-01-07 DIAGNOSIS — N993 Prolapse of vaginal vault after hysterectomy: Secondary | ICD-10-CM

## 2014-01-07 DIAGNOSIS — I70219 Atherosclerosis of native arteries of extremities with intermittent claudication, unspecified extremity: Secondary | ICD-10-CM

## 2014-01-07 DIAGNOSIS — I4891 Unspecified atrial fibrillation: Secondary | ICD-10-CM | POA: Diagnosis present

## 2014-01-07 DIAGNOSIS — R739 Hyperglycemia, unspecified: Secondary | ICD-10-CM

## 2014-01-07 DIAGNOSIS — I359 Nonrheumatic aortic valve disorder, unspecified: Secondary | ICD-10-CM

## 2014-01-07 DIAGNOSIS — I739 Peripheral vascular disease, unspecified: Secondary | ICD-10-CM | POA: Diagnosis present

## 2014-01-07 DIAGNOSIS — Z86711 Personal history of pulmonary embolism: Secondary | ICD-10-CM

## 2014-01-07 DIAGNOSIS — G4700 Insomnia, unspecified: Secondary | ICD-10-CM

## 2014-01-07 DIAGNOSIS — R040 Epistaxis: Secondary | ICD-10-CM

## 2014-01-07 DIAGNOSIS — F3289 Other specified depressive episodes: Secondary | ICD-10-CM

## 2014-01-07 DIAGNOSIS — R140 Abdominal distension (gaseous): Secondary | ICD-10-CM

## 2014-01-07 DIAGNOSIS — J449 Chronic obstructive pulmonary disease, unspecified: Secondary | ICD-10-CM

## 2014-01-07 LAB — URINALYSIS, ROUTINE W REFLEX MICROSCOPIC
Bilirubin Urine: NEGATIVE
GLUCOSE, UA: NEGATIVE mg/dL
Hgb urine dipstick: NEGATIVE
Ketones, ur: NEGATIVE mg/dL
LEUKOCYTES UA: NEGATIVE
Nitrite: NEGATIVE
Protein, ur: 30 mg/dL — AB
SPECIFIC GRAVITY, URINE: 1.015 (ref 1.005–1.030)
UROBILINOGEN UA: 1 mg/dL (ref 0.0–1.0)
pH: 7 (ref 5.0–8.0)

## 2014-01-07 LAB — CBC
HCT: 35.1 % — ABNORMAL LOW (ref 36.0–46.0)
Hemoglobin: 11.7 g/dL — ABNORMAL LOW (ref 12.0–15.0)
MCH: 31.9 pg (ref 26.0–34.0)
MCHC: 33.3 g/dL (ref 30.0–36.0)
MCV: 95.6 fL (ref 78.0–100.0)
PLATELETS: 231 10*3/uL (ref 150–400)
RBC: 3.67 MIL/uL — ABNORMAL LOW (ref 3.87–5.11)
RDW: 17.4 % — AB (ref 11.5–15.5)
WBC: 5 10*3/uL (ref 4.0–10.5)

## 2014-01-07 LAB — CREATININE, SERUM
CREATININE: 0.88 mg/dL (ref 0.50–1.10)
GFR calc Af Amer: 70 mL/min — ABNORMAL LOW (ref >90)
GFR calc non Af Amer: 60 mL/min — ABNORMAL LOW (ref >90)

## 2014-01-07 LAB — COMPREHENSIVE METABOLIC PANEL
ALBUMIN: 3.3 g/dL — AB (ref 3.5–5.2)
ALT: 9 U/L (ref 0–35)
AST: 26 U/L (ref 0–37)
Alkaline Phosphatase: 75 U/L (ref 39–117)
BUN: 17 mg/dL (ref 6–23)
CALCIUM: 8.9 mg/dL (ref 8.4–10.5)
CO2: 21 mEq/L (ref 19–32)
CREATININE: 0.87 mg/dL (ref 0.50–1.10)
Chloride: 104 mEq/L (ref 96–112)
GFR calc Af Amer: 70 mL/min — ABNORMAL LOW (ref >90)
GFR, EST NON AFRICAN AMERICAN: 61 mL/min — AB (ref >90)
Glucose, Bld: 101 mg/dL — ABNORMAL HIGH (ref 70–99)
Potassium: 3.6 mEq/L — ABNORMAL LOW (ref 3.7–5.3)
Sodium: 138 mEq/L (ref 137–147)
Total Bilirubin: 0.7 mg/dL (ref 0.3–1.2)
Total Protein: 8.2 g/dL (ref 6.0–8.3)

## 2014-01-07 LAB — URINE CULTURE: Colony Count: >=100000

## 2014-01-07 LAB — URINE MICROSCOPIC-ADD ON

## 2014-01-07 LAB — CBC WITH DIFFERENTIAL/PLATELET
BASOS ABS: 0 10*3/uL (ref 0.0–0.1)
Basophils Relative: 1 % (ref 0–1)
EOS PCT: 7 % — AB (ref 0–5)
Eosinophils Absolute: 0.4 10*3/uL (ref 0.0–0.7)
HCT: 35.3 % — ABNORMAL LOW (ref 36.0–46.0)
Hemoglobin: 11.5 g/dL — ABNORMAL LOW (ref 12.0–15.0)
Lymphocytes Relative: 18 % (ref 12–46)
Lymphs Abs: 1 10*3/uL (ref 0.7–4.0)
MCH: 31.3 pg (ref 26.0–34.0)
MCHC: 32.6 g/dL (ref 30.0–36.0)
MCV: 95.9 fL (ref 78.0–100.0)
MONO ABS: 0.4 10*3/uL (ref 0.1–1.0)
Monocytes Relative: 7 % (ref 3–12)
Neutro Abs: 3.6 10*3/uL (ref 1.7–7.7)
Neutrophils Relative %: 67 % (ref 43–77)
Platelets: 235 10*3/uL (ref 150–400)
RBC: 3.68 MIL/uL — ABNORMAL LOW (ref 3.87–5.11)
RDW: 17.3 % — AB (ref 11.5–15.5)
WBC: 5.3 10*3/uL (ref 4.0–10.5)

## 2014-01-07 LAB — PROTIME-INR
INR: 1.58 — ABNORMAL HIGH (ref 0.00–1.49)
Prothrombin Time: 18.4 seconds — ABNORMAL HIGH (ref 11.6–15.2)

## 2014-01-07 LAB — MRSA PCR SCREENING: MRSA BY PCR: POSITIVE — AB

## 2014-01-07 MED ORDER — ACETAMINOPHEN 500 MG PO TABS: 1000.0000 mg | ORAL_TABLET | Freq: Two times a day (BID) | ORAL | Status: DC | PRN

## 2014-01-07 MED ORDER — NITROFURANTOIN MONOHYD MACRO 100 MG PO CAPS
100.0000 mg | ORAL_CAPSULE | Freq: Two times a day (BID) | ORAL | Status: DC
  Filled 2014-01-07 (×2): qty 1

## 2014-01-07 MED ORDER — SODIUM CHLORIDE 0.9 % IV SOLN
1.5000 g | Freq: Three times a day (TID) | INTRAVENOUS | Status: DC
  Administered 2014-01-07 – 2014-01-09 (×6): 1.5 g via INTRAVENOUS
  Filled 2014-01-07 (×7): qty 1.5

## 2014-01-07 MED ORDER — HEPARIN (PORCINE) IN NACL 100-0.45 UNIT/ML-% IJ SOLN
350.0000 [IU]/h | INTRAMUSCULAR | Status: DC
  Administered 2014-01-07: 350 [IU]/h via INTRAVENOUS
  Filled 2014-01-07: qty 250

## 2014-01-07 MED ORDER — IOHEXOL 350 MG/ML SOLN
50.0000 mL | Freq: Once | INTRAVENOUS | Status: AC | PRN
Start: 2014-01-07 — End: 2014-01-07
  Administered 2014-01-07: 50 mL via INTRAVENOUS

## 2014-01-07 MED ORDER — MUPIROCIN 2 % EX OINT
TOPICAL_OINTMENT | Freq: Two times a day (BID) | CUTANEOUS | Status: DC
  Administered 2014-01-07 – 2014-01-09 (×4): via NASAL
  Filled 2014-01-07: qty 22

## 2014-01-07 MED ORDER — CHLORHEXIDINE GLUCONATE CLOTH 2 % EX PADS
6.0000 | MEDICATED_PAD | Freq: Every day | CUTANEOUS | Status: DC
  Administered 2014-01-08 – 2014-01-09 (×2): 6 via TOPICAL

## 2014-01-07 MED ORDER — TIOTROPIUM BROMIDE MONOHYDRATE 18 MCG IN CAPS
18.0000 ug | ORAL_CAPSULE | Freq: Every day | RESPIRATORY_TRACT | Status: DC
  Administered 2014-01-08: 18 ug via RESPIRATORY_TRACT
  Filled 2014-01-07 (×2): qty 5

## 2014-01-07 MED ORDER — LEVOTHYROXINE SODIUM 75 MCG PO TABS
75.0000 ug | ORAL_TABLET | Freq: Every day | ORAL | Status: DC
  Administered 2014-01-08 – 2014-01-09 (×2): 75 ug via ORAL
  Filled 2014-01-07 (×3): qty 1

## 2014-01-07 MED ORDER — SALINE SPRAY 0.65 % NA SOLN
1.0000 | NASAL | Status: DC | PRN
  Filled 2014-01-07: qty 44

## 2014-01-07 MED ORDER — SODIUM CHLORIDE 0.9 % IV SOLN
INTRAVENOUS | Status: DC
  Administered 2014-01-07 – 2014-01-08 (×2): via INTRAVENOUS

## 2014-01-07 MED ORDER — IPRATROPIUM-ALBUTEROL 0.5-2.5 (3) MG/3ML IN SOLN: 3.0000 mL | RESPIRATORY_TRACT | Status: DC | PRN

## 2014-01-07 MED ORDER — ENOXAPARIN SODIUM 30 MG/0.3ML ~~LOC~~ SOLN
30.0000 mg | SUBCUTANEOUS | Status: DC
  Filled 2014-01-07: qty 0.3

## 2014-01-07 MED ORDER — VITAMINS A & D EX OINT
1.0000 "application " | TOPICAL_OINTMENT | CUTANEOUS | Status: DC | PRN
  Filled 2014-01-07: qty 5

## 2014-01-07 MED ORDER — PANTOPRAZOLE SODIUM 40 MG PO TBEC
40.0000 mg | DELAYED_RELEASE_TABLET | Freq: Every day | ORAL | Status: DC
  Administered 2014-01-08 – 2014-01-09 (×2): 40 mg via ORAL
  Filled 2014-01-07 (×2): qty 1

## 2014-01-07 MED ORDER — IPRATROPIUM-ALBUTEROL 0.5-2.5 (3) MG/3ML IN SOLN: 3.0000 mL | Freq: Four times a day (QID) | RESPIRATORY_TRACT | Status: DC

## 2014-01-07 MED ORDER — WARFARIN SODIUM 5 MG PO TABS
5.0000 mg | ORAL_TABLET | Freq: Once | ORAL | Status: AC
  Administered 2014-01-07: 5 mg via ORAL
  Filled 2014-01-07: qty 1

## 2014-01-07 MED ORDER — DEXTROSE 5 % IV SOLN
1.0000 g | INTRAVENOUS | Status: DC
  Administered 2014-01-07 – 2014-01-08 (×2): 1 g via INTRAVENOUS
  Filled 2014-01-07 (×3): qty 10

## 2014-01-07 MED ORDER — SODIUM CHLORIDE 0.9 % IV SOLN
1.0000 g | Freq: Four times a day (QID) | INTRAVENOUS | Status: DC
  Filled 2014-01-07 (×3): qty 1000

## 2014-01-07 MED ORDER — MEMANTINE HCL 10 MG PO TABS
10.0000 mg | ORAL_TABLET | Freq: Two times a day (BID) | ORAL | Status: DC
  Administered 2014-01-07 – 2014-01-09 (×4): 10 mg via ORAL
  Filled 2014-01-07 (×6): qty 1

## 2014-01-07 MED ORDER — WARFARIN - PHARMACIST DOSING INPATIENT: Freq: Every day | Status: DC

## 2014-01-07 MED ORDER — HYDRALAZINE HCL 20 MG/ML IJ SOLN: 5.0000 mg | Freq: Four times a day (QID) | INTRAMUSCULAR | Status: DC | PRN

## 2014-01-07 MED ORDER — RIVASTIGMINE 4.6 MG/24HR TD PT24
13.8000 mg | MEDICATED_PATCH | Freq: Every day | TRANSDERMAL | Status: DC
  Administered 2014-01-07: 13.8 mg via TRANSDERMAL
  Filled 2014-01-07 (×2): qty 3

## 2014-01-07 MED ORDER — FLUTICASONE PROPIONATE HFA 44 MCG/ACT IN AERO
2.0000 | INHALATION_SPRAY | Freq: Every day | RESPIRATORY_TRACT | Status: DC
  Administered 2014-01-08: 2 via RESPIRATORY_TRACT
  Filled 2014-01-07 (×2): qty 10.6

## 2014-01-07 MED ORDER — POLYETHYLENE GLYCOL 3350 17 G PO PACK
17.0000 g | PACK | Freq: Every day | ORAL | Status: DC
  Administered 2014-01-09: 17 g via ORAL
  Filled 2014-01-07 (×2): qty 1

## 2014-01-07 MED ORDER — SIMVASTATIN 10 MG PO TABS
10.0000 mg | ORAL_TABLET | Freq: Every day | ORAL | Status: DC
  Administered 2014-01-08: 10 mg via ORAL
  Filled 2014-01-07 (×3): qty 1

## 2014-01-07 MED ORDER — DILTIAZEM HCL ER COATED BEADS 240 MG PO CP24
240.0000 mg | ORAL_CAPSULE | Freq: Every day | ORAL | Status: DC
  Administered 2014-01-08 – 2014-01-09 (×2): 240 mg via ORAL
  Filled 2014-01-07 (×2): qty 1

## 2014-01-07 MED ORDER — METOCLOPRAMIDE HCL 10 MG PO TABS
10.0000 mg | ORAL_TABLET | Freq: Four times a day (QID) | ORAL | Status: DC
  Administered 2014-01-07 – 2014-01-09 (×6): 10 mg via ORAL
  Filled 2014-01-07 (×11): qty 1

## 2014-01-07 MED ORDER — SENNOSIDES-DOCUSATE SODIUM 8.6-50 MG PO TABS
1.0000 | ORAL_TABLET | Freq: Every day | ORAL | Status: DC
  Administered 2014-01-07 – 2014-01-08 (×2): 1 via ORAL
  Filled 2014-01-07 (×2): qty 1

## 2014-01-07 NOTE — ED Notes (Signed)
MRI called and stated the pacemaker in place was not MRI safe. EDP and family made aware.

## 2014-01-07 NOTE — H&P (Signed)
   I agree with the History/assesment & plan per Midlevel provider as per above, and independently assessed and discussed the plan of care with the patient, except where as noted Verneita Griffes, MD [facility] [phone]

## 2014-01-07 NOTE — Progress Notes (Signed)
Nasal MRSA PCR screening tested positive. Started on mupirocin ointment and daily CHG wipes per protocol.

## 2014-01-07 NOTE — Progress Notes (Signed)
ANTICOAGULATION CONSULT NOTE - Initial Consult  Pharmacy Consult for Coumadin/ Lovenox  Indication:  H/o PE , PAD, MVR, Afib, CVA and current subtherapeutic INR on chronic coumadin  Allergies  Allergen Reactions  . Donepezil Hydrochloride     REACTION: heart fluttering  . Morphine Sulfate   . Oxycodone-Acetaminophen     REACTION: Hallucinations  . Percocet [Oxycodone-Acetaminophen]     Patient Measurements: Height: 5' 4.17" (163 cm) Weight: 98 lb 1.7 oz (44.5 kg) IBW/kg (Calculated) : 55.1   Vital Signs: Temp: 98.3 F (36.8 C) (06/02 1550) Temp src: Oral (06/02 1550) BP: 181/61 mmHg (06/02 1550) Pulse Rate: 83 (06/02 1550)  Labs:  Recent Labs  01/06/14 2133 01/06/14 2145 01/07/14 1218  HGB 11.0* 12.6 11.5*  HCT 34.2* 37.0 35.3*  PLT 242  --  235  APTT 41*  --   --   LABPROT 18.3*  --   --   INR 1.57*  --   --   CREATININE 1.15* 1.30* 0.87    Estimated Creatinine Clearance: 35.6 ml/min (by C-G formula based on Cr of 0.87).   Medical History: Past Medical History  Diagnosis Date  . CAD (coronary artery disease)   . History of CVA (cerebrovascular accident)   . Atrial fibrillation     peristent  . Hyperlipemia   . COPD (chronic obstructive pulmonary disease)   . Epistaxis     recurrent  . Osteoporosis   . Hypothyroidism   . History of pulmonary embolism   . History of colonic diverticulitis   . History of breast cancer   . Asthma   . Iron deficiency anemia   . History of transfusion of whole blood   . LBBB (left bundle branch block)     history of  . Aortal stenosis     severe  . Pacemaker     bradycardia s/p pacemaker implant  . PAD (peripheral artery disease)     left leg occluded viabahn stents x 3 left SFA  . Tachycardia-bradycardia syndrome     s/p PPM  . Cancer   . Arthritis   . Stroke   . HTN (hypertension) 09/25/2012  . HTN (hypertension) 09/25/2012    Medications:  Prescriptions prior to admission  Medication Sig Dispense Refill   . acetaminophen (TYLENOL) 500 MG tablet Take 1,000 mg by mouth 2 (two) times daily as needed (pain).      Marland Kitchen albuterol (PROVENTIL HFA;VENTOLIN HFA) 108 (90 BASE) MCG/ACT inhaler Inhale 2 puffs into the lungs every 6 (six) hours as needed for wheezing or shortness of breath.  1 Inhaler  3  . cephALEXin (KEFLEX) 250 MG capsule Take 1 capsule (250 mg total) by mouth 4 (four) times daily.  28 capsule  0  . diltiazem (CARDIZEM CD) 240 MG 24 hr capsule Take 240 mg by mouth daily.       . fluticasone (FLOVENT HFA) 44 MCG/ACT inhaler Inhale 2 puffs into the lungs daily.       Marland Kitchen guaifenesin (ROBITUSSIN) 100 MG/5ML syrup Take 100 mg by mouth every 4 (four) hours as needed for cough.      . levothyroxine (SYNTHROID, LEVOTHROID) 75 MCG tablet Take 1 tablet (75 mcg total) by mouth daily.  90 tablet  3  . Melatonin 3 MG TABS Take 3 mg by mouth at bedtime.       . memantine (NAMENDA) 10 MG tablet Take 10 mg by mouth 2 (two) times daily.       . metoCLOPramide (REGLAN)  10 MG tablet Take 1 tablet (10 mg total) by mouth every 6 (six) hours.  30 tablet  0  . omeprazole (PRILOSEC) 20 MG capsule Take 20 mg by mouth daily. Take on and empty stomach- Do no crush.      . pantoprazole (PROTONIX) 40 MG tablet Take 1 tablet (40 mg total) by mouth daily.  30 tablet  0  . polyethylene glycol (MIRALAX / GLYCOLAX) packet Take 17 g by mouth daily. Mix 17gm in 6-8 ounces of liquid daily.       . pravastatin (PRAVACHOL) 20 MG tablet Take 1 tablet (20 mg total) by mouth daily.  30 tablet  3  . Rivastigmine (EXELON) 13.3 MG/24HR PT24 Place 1 patch onto the skin daily.      Marland Kitchen SALINE NASAL SPRAY NA Place 1 spray into both nostrils 2 (two) times daily. 0.65% saline nasal spray      . tiotropium (SPIRIVA) 18 MCG inhalation capsule Place 18 mcg into inhaler and inhale daily.        . Vaginal Lubricant (REPLENS) GEL Place 1 application vaginally daily as needed (vaginal dryness).      . vitamin E 400 UNIT capsule Take 400 Units by mouth  daily.        . Vitamins A & D (VITAMIN A & D) ointment Apply 1 application topically as needed for dry skin.      Marland Kitchen warfarin (COUMADIN) 2 MG tablet Take 2 mg by mouth 2 (two) times a week. 2 mg on Wed and Sat 3 mg Mon, Tues, Thurs, Fri and Sun      . warfarin (COUMADIN) 3 MG tablet Take 3 mg by mouth daily. 2 mg on Wed and Sat 3 mg Mon, Tues, Thurs, Fri and Sun       Scheduled:  . ampicillin-sulbactam (UNASYN) IV  1.5 g Intravenous Q8H  . cefTRIAXone (ROCEPHIN)  IV  1 g Intravenous Q24H  . [START ON 01/08/2014] diltiazem  240 mg Oral Daily  . [START ON 01/08/2014] fluticasone  2 puff Inhalation Daily  . ipratropium-albuterol  3 mL Nebulization QID  . [START ON 01/08/2014] levothyroxine  75 mcg Oral QAC breakfast  . memantine  10 mg Oral BID  . metoCLOPramide  10 mg Oral Q6H  . [START ON 01/08/2014] pantoprazole  40 mg Oral Daily  . [START ON 01/08/2014] polyethylene glycol  17 g Oral Daily  . rivastigmine  13.8 mg Transdermal Daily  . senna-docusate  1 tablet Oral QHS  . simvastatin  10 mg Oral q1800  . [START ON 01/08/2014] tiotropium  18 mcg Inhalation Daily   Infusions:  . sodium chloride      Assessment: 80 y.o female with PMH of CVA, PE, MVR and PAD with stents in her lower extremitites who was brought back to ED today 01/07/14 from her SNF due to decreased responsiveness, left sided weakness and difficulty with her secretions. Patient was also [facility] at [facility] on Friday 5/29 and at [facility] ED on 6/1 with similar symptoms. Admitted for likely new CVA and subtherapeutic INR on chronic coumadin for h/o PE, MVR, Afib and CVA.  INR = 1.57 on 01/06/14.  Coumadin dose (3mg ) last charted as given at SNF on 01/06/14.   PTA dose is 3 mg daily except 2mg  qWed and Sat.   CT head 01/06/14 No intracranial hemorrhage or acute infarct identified.  Goal of Therapy:  INR 2-3 Monitor platelets by anticoagulation protocol: Yes   Plan:  STAT  INR prior to dosing Lovenox  Nicole Cella, RPh Clinical  Pharmacist Pager: [phone] 01/07/2014,4:25 PM

## 2014-01-07 NOTE — Evaluation (Signed)
Clinical/Bedside Swallow Evaluation Patient Details  Name: Rita Miller MRN: [id number] Date of Birth: 1930/04/20  Today's Date: 01/07/2014 Time: 5277-8242 SLP Time Calculation (min): 21 min  Past Medical History:  Past Medical History  Diagnosis Date  . CAD (coronary artery disease)   . History of CVA (cerebrovascular accident)   . Atrial fibrillation     peristent  . Hyperlipemia   . COPD (chronic obstructive pulmonary disease)   . Epistaxis     recurrent  . Osteoporosis   . Hypothyroidism   . History of pulmonary embolism   . History of colonic diverticulitis   . History of breast cancer   . Asthma   . Iron deficiency anemia   . History of transfusion of whole blood   . LBBB (left bundle branch block)     history of  . Aortal stenosis     severe  . Pacemaker     bradycardia s/p pacemaker implant  . PAD (peripheral artery disease)     left leg occluded viabahn stents x 3 left SFA  . Tachycardia-bradycardia syndrome     s/p PPM  . Cancer   . Arthritis   . Stroke   . HTN (hypertension) 09/25/2012  . HTN (hypertension) 09/25/2012   Past Surgical History:  Past Surgical History  Procedure Laterality Date  . Thyroidectomy      due to cancer  . Total hip arthroplasty    . Abdominal hysterectomy    . Mastectomy    . Mitral valve repair    . Intraocular lens insertion      left eye implant  . Pacemaker placement      for tachy/brady syndrome   HPI:  80 year old female with history of CAD, atrial fibrillation. She is currently a resident of an extended care facility. She was sent here for evaluation of weakness and concern for right CVA.  Dtr reports her mother lives at [facility]; facility sending regular meals which she could not chew given ill-fitting dentures.     Assessment / Plan / Recommendation Clinical Impression  Pt presents with functional swallow for limited consistencies (refused several items offered.)  Demonstrates mild oral deficits with prolonged oral  preparation, mild residue.  Dentures not present, nor do they fit per daughter.  Swallow response present, potentially delayed, but no overt s/s of aspiration with thin liquids nor applesauce.  Recommend beginning a dysphagia 1 diet with thin liquids for now; meds whole in puree.  Will f/u briefly to determine if diet should be advanced or if purees are more realistic, and to ensure safety.  SLE ordered for next date.     Aspiration Risk  Mild    Diet Recommendation Dysphagia 1 (Puree);Thin liquid   Liquid Administration via: Cup Medication Administration: Whole meds with puree Supervision:  (assist as needed) Compensations: Small sips/bites Postural Changes and/or Swallow Maneuvers: Seated upright 90 degrees    Other  Recommendations Oral Care Recommendations: Oral care BID   Follow Up Recommendations  None    Frequency and Duration min 1 x/week  1 week       SLP Swallow Goals     Swallow Study Prior Functional Status       General Date of Onset: 01/06/14 HPI: 80 year old female with history of CAD, atrial fibrillation. She is currently a resident of an extended care facility. She was sent here for evaluation of weakness and concern of a CVA.  Dtr reports her mother lives at [facility]; facility sending regular meals which  she could not chew given ill-fitting dentures.   Type of Study: Bedside swallow evaluation Previous Swallow Assessment: none per records Diet Prior to this Study: NPO Temperature Spikes Noted: No Respiratory Status: Nasal cannula History of Recent Intubation: No Behavior/Cognition: Alert;Cooperative Oral Cavity - Dentition: Edentulous Self-Feeding Abilities: Needs assist Patient Positioning: Upright in bed Baseline Vocal Quality: Clear (hypernasal resonance) Volitional Cough: Strong Volitional Swallow: Able to elicit    Oral/Motor/Sensory Function Overall Oral Motor/Sensory Function:  (mild decrease left CN VII lower face)   Ice Chips Ice chips:  (pt  refused - does not like cold substances)   Thin Liquid Thin Liquid: Impaired Presentation: Cup Oral Phase Impairments: Reduced labial seal;Reduced lingual movement/coordination Oral Phase Functional Implications: Prolonged oral transit Pharyngeal  Phase Impairments: Suspected delayed Swallow    Nectar Thick Nectar Thick Liquid: Not tested   Honey Thick Honey Thick Liquid: Not tested   Puree Puree: Impaired Presentation: Spoon Oral Phase Impairments: Reduced lingual movement/coordination Oral Phase Functional Implications: Prolonged oral transit;Oral residue Pharyngeal Phase Impairments: Suspected delayed Swallow   Solid   GO   Jennifier Smitherman L. [place], [place] CCC/SLP Pager [phone]   Solid: Not tested       Assunta Curtis 01/07/2014,4:33 PM

## 2014-01-07 NOTE — ED Provider Notes (Signed)
CSN: [id number]     Arrival date & time 01/07/14  1143 History   First MD Initiated Contact with Patient 01/07/14 1155     Chief Complaint  Patient presents with  . Weakness     (Consider location/radiation/quality/duration/timing/severity/associated sxs/prior Treatment) Patient is a 80 y.o. female presenting with weakness. The history is provided by a caregiver (daughter states pt has had weakness left arm and leg for 3 days).  Weakness This is a new problem. The current episode started more than 2 days ago. The problem occurs constantly. Pertinent negatives include no chest pain and no abdominal pain. Nothing aggravates the symptoms. Nothing relieves the symptoms.    Past Medical History  Diagnosis Date  . CAD (coronary artery disease)   . History of CVA (cerebrovascular accident)   . Atrial fibrillation     peristent  . Hyperlipemia   . COPD (chronic obstructive pulmonary disease)   . Epistaxis     recurrent  . Osteoporosis   . Hypothyroidism   . History of pulmonary embolism   . History of colonic diverticulitis   . History of breast cancer   . Asthma   . Iron deficiency anemia   . History of transfusion of whole blood   . LBBB (left bundle branch block)     history of  . Aortal stenosis     severe  . Pacemaker     bradycardia s/p pacemaker implant  . PAD (peripheral artery disease)     left leg occluded viabahn stents x 3 left SFA  . Tachycardia-bradycardia syndrome     s/p PPM  . Cancer   . Arthritis   . Stroke   . HTN (hypertension) 09/25/2012  . HTN (hypertension) 09/25/2012   Past Surgical History  Procedure Laterality Date  . Thyroidectomy      due to cancer  . Total hip arthroplasty    . Abdominal hysterectomy    . Mastectomy    . Mitral valve repair    . Intraocular lens insertion      left eye implant  . Pacemaker placement      for tachy/brady syndrome   Family History  Problem Relation Age of Onset  . Breast cancer    . Colon cancer    .  Diabetes    . Hyperlipidemia    . Hypertension    . Heart disease Mother     unknown heart disease   History  Substance Use Topics  . Smoking status: Never Smoker   . Smokeless tobacco: Never Used  . Alcohol Use: No   OB History   Grav Para Term Preterm Abortions TAB SAB Ect Mult Living                 Review of Systems  Unable to perform ROS: Dementia  Cardiovascular: Negative for chest pain.  Gastrointestinal: Negative for abdominal pain.  Neurological: Positive for weakness.      Allergies  Donepezil hydrochloride; Morphine sulfate; Oxycodone-acetaminophen; and Percocet  Home Medications   Prior to Admission medications   Medication Sig Start Date End Date Taking? Authorizing Provider  acetaminophen (TYLENOL) 500 MG tablet Take 1,000 mg by mouth 2 (two) times daily as needed (pain).   Yes Historical Provider, MD  albuterol (PROVENTIL HFA;VENTOLIN HFA) 108 (90 BASE) MCG/ACT inhaler Inhale 2 puffs into the lungs every 6 (six) hours as needed for wheezing or shortness of breath. 12/31/13  Yes Mosie Lukes, MD  cephALEXin (KEFLEX) 250 MG capsule Take  1 capsule (250 mg total) by mouth 4 (four) times daily. 01/03/14  Yes Dot Lanes, MD  diltiazem (CARDIZEM CD) 240 MG 24 hr capsule Take 240 mg by mouth daily.    Yes Historical Provider, MD  fluticasone (FLOVENT HFA) 44 MCG/ACT inhaler Inhale 2 puffs into the lungs daily.    Yes Historical Provider, MD  guaifenesin (ROBITUSSIN) 100 MG/5ML syrup Take 100 mg by mouth every 4 (four) hours as needed for cough.   Yes Historical Provider, MD  levothyroxine (SYNTHROID, LEVOTHROID) 75 MCG tablet Take 1 tablet (75 mcg total) by mouth daily. 08/02/13  Yes Mosie Lukes, MD  Melatonin 3 MG TABS Take 3 mg by mouth at bedtime.    Yes Historical Provider, MD  memantine (NAMENDA) 10 MG tablet Take 10 mg by mouth 2 (two) times daily.    Yes Historical Provider, MD  metoCLOPramide (REGLAN) 10 MG tablet Take 1 tablet (10 mg total) by mouth  every 6 (six) hours. 01/03/14  Yes Dot Lanes, MD  omeprazole (PRILOSEC) 20 MG capsule Take 20 mg by mouth daily. Take on and empty stomach- Do no crush. 05/15/11  Yes Historical Provider, MD  pantoprazole (PROTONIX) 40 MG tablet Take 1 tablet (40 mg total) by mouth daily. 01/03/14  Yes Dot Lanes, MD  polyethylene glycol (MIRALAX / GLYCOLAX) packet Take 17 g by mouth daily. Mix 17gm in 6-8 ounces of liquid daily.    Yes Historical Provider, MD  pravastatin (PRAVACHOL) 20 MG tablet Take 1 tablet (20 mg total) by mouth daily. 11/26/13  Yes Mosie Lukes, MD  Rivastigmine (EXELON) 13.3 MG/24HR PT24 Place 1 patch onto the skin daily.   Yes Historical Provider, MD  SALINE NASAL SPRAY NA Place 1 spray into both nostrils 2 (two) times daily. 0.65% saline nasal spray   Yes Historical Provider, MD  tiotropium (SPIRIVA) 18 MCG inhalation capsule Place 18 mcg into inhaler and inhale daily.     Yes Historical Provider, MD  Vaginal Lubricant (REPLENS) GEL Place 1 application vaginally daily as needed (vaginal dryness).   Yes Historical Provider, MD  vitamin E 400 UNIT capsule Take 400 Units by mouth daily.     Yes Historical Provider, MD  Vitamins A & D (VITAMIN A & D) ointment Apply 1 application topically as needed for dry skin.   Yes Historical Provider, MD  warfarin (COUMADIN) 2 MG tablet Take 2 mg by mouth 2 (two) times a week. 2 mg on Wed and Sat 3 mg Mon, Tues, Thurs, Fri and Sun 02/02/12  Yes Historical Provider, MD  warfarin (COUMADIN) 3 MG tablet Take 3 mg by mouth daily. 2 mg on Wed and Sat 3 mg Mon, Tues, Thurs, Fri and Sun   Yes Historical Provider, MD   BP 174/50  Pulse 80  Temp(Src) 97.7 F (36.5 C) (Oral)  Resp 15  SpO2 100% Physical Exam  Constitutional: She appears well-developed.  HENT:  Head: Normocephalic.  Eyes: Conjunctivae and EOM are normal. No scleral icterus.  Neck: Neck supple. No thyromegaly present.  Cardiovascular: Normal rate and regular rhythm.  Exam reveals no  gallop and no friction rub.   No murmur heard. Pulmonary/Chest: No stridor. She has no wheezes. She has no rales. She exhibits no tenderness.  Abdominal: She exhibits no distension. There is no tenderness. There is no rebound.  Musculoskeletal: Normal range of motion. She exhibits no edema.  Lymphadenopathy:    She has no cervical adenopathy.  Neurological: She is alert. She  exhibits normal muscle tone. Coordination normal.  Unable to move left arm and leg  Skin: No rash noted. No erythema.  Psychiatric: She has a normal mood and affect. Her behavior is normal.    ED Course  Procedures (including critical care time) Labs Review Labs Reviewed  CBC WITH DIFFERENTIAL - Abnormal; Notable for the following:    RBC 3.68 (*)    Hemoglobin 11.5 (*)    HCT 35.3 (*)    RDW 17.3 (*)    Eosinophils Relative 7 (*)    All other components within normal limits  COMPREHENSIVE METABOLIC PANEL - Abnormal; Notable for the following:    Potassium 3.6 (*)    Glucose, Bld 101 (*)    Albumin 3.3 (*)    GFR calc non Af Amer 61 (*)    GFR calc Af Amer 70 (*)    All other components within normal limits  URINALYSIS, ROUTINE W REFLEX MICROSCOPIC - Abnormal; Notable for the following:    Protein, ur 30 (*)    All other components within normal limits  URINE MICROSCOPIC-ADD ON - Abnormal; Notable for the following:    Squamous Epithelial / LPF FEW (*)    All other components within normal limits    Imaging Review Ct Head (brain) Wo Contrast  01/06/2014   CLINICAL DATA:  Stroke-like symptoms left-sided weakness and slurred speech with facial droop.  EXAM: CT HEAD WITHOUT CONTRAST  TECHNIQUE: Contiguous axial images were obtained from the base of the skull through the vertex without intravenous contrast.  COMPARISON:  11/07/2009.  FINDINGS: Skull and Sinuses:No fracture destructive process. Gas posterior to the lower right maxillary sinus is likely within the oral cavity. There is circumferential mucosal  thickening in the left maxillary sinus, with secretion retention.  Orbits: Bilateral cataract resection.  Brain: Extensive encephalomalacia and gliosis involving the right MCA territory, there is mild sparing at the [facility] border zones. There is no evidence of peri-infarct ischemia. Remote lacunar noted in the right caudate nucleus. There is a small vessel infarct in the upper left cerebellum, appearance and history compatible with remote injury. Ischemic gliosis which is confluent around the lateral ventricles. No evidence of hemorrhage, hydrocephalus, or mass lesion.  IMPRESSION: 1. No intracranial hemorrhage or acute infarct identified. 2. Remote right MCA territory infarct. 3. Left maxillary sinusitis.   Electronically Signed   By: Jorje Guild M.D.   On: 01/06/2014 22:50   Dg Chest Portable 1 View  01/07/2014   CLINICAL DATA:  Weakness  EXAM: PORTABLE CHEST - 1 VIEW  COMPARISON:  01/03/2014  FINDINGS: Cardiac shadow is stable. A pacing device is again seen. Some persistent changes in the right upper lobe are noted. No new focal abnormality is seen. The lungs remain hyperinflated.  IMPRESSION: Persistent changes in the right upper lobe. No acute abnormality is seen.   Electronically Signed   By: Inez Catalina M.D.   On: 01/07/2014 12:59     EKG Interpretation None      MDM   Final diagnoses:  CVA (cerebral infarction)    Admit for stroke    Maudry Diego, MD 01/07/14 1444

## 2014-01-07 NOTE — Progress Notes (Addendum)
ANTICOAGULATION CONSULT NOTE - Initial Consult  Pharmacy Consult for Coumadin/ Heparin IV  (Cancel lovenox)  Indication: new CVA with  H/o PE, Right CVA, Afib,  PAD previous stents x3 in left leg and current subtherapeutic INR on chronic coumadin  Allergies  Allergen Reactions  . Donepezil Hydrochloride     REACTION: heart fluttering  . Morphine Sulfate   . Oxycodone-Acetaminophen     REACTION: Hallucinations  . Percocet [Oxycodone-Acetaminophen]     Patient Measurements: Height: 5' 4.17" (163 cm) Weight: 98 lb 1.7 oz (44.5 kg) IBW/kg (Calculated) : 55.1   Vital Signs: Temp: 98.3 F (36.8 C) (06/02 1550) Temp src: Oral (06/02 1550) BP: 146/60 mmHg (06/02 1654) Pulse Rate: 83 (06/02 1654)  Labs:  Recent Labs  01/06/14 2133 01/06/14 2145 01/07/14 1218 01/07/14 1630 01/07/14 1907  HGB 11.0* 12.6 11.5* 11.7*  --   HCT 34.2* 37.0 35.3* 35.1*  --   PLT 242  --  235 231  --   APTT 41*  --   --   --   --   LABPROT 18.3*  --   --   --  18.4*  INR 1.57*  --   --   --  1.58*  CREATININE 1.15* 1.30* 0.87 0.88  --     Estimated Creatinine Clearance: 35.2 ml/min (by C-G formula based on Cr of 0.88).   Medical History: Past Medical History  Diagnosis Date  . CAD (coronary artery disease)   . History of CVA (cerebrovascular accident)   . Atrial fibrillation     peristent  . Hyperlipemia   . COPD (chronic obstructive pulmonary disease)   . Epistaxis     recurrent  . Osteoporosis   . Hypothyroidism   . History of pulmonary embolism   . History of colonic diverticulitis   . History of breast cancer   . Asthma   . Iron deficiency anemia   . History of transfusion of whole blood   . LBBB (left bundle branch block)     history of  . Aortal stenosis     severe  . Pacemaker     bradycardia s/p pacemaker implant  . PAD (peripheral artery disease)     left leg occluded viabahn stents x 3 left SFA  . Tachycardia-bradycardia syndrome     s/p PPM  . Cancer   .  Arthritis   . Stroke   . HTN (hypertension) 09/25/2012  . HTN (hypertension) 09/25/2012    Medications:  Prescriptions prior to admission  Medication Sig Dispense Refill  . acetaminophen (TYLENOL) 500 MG tablet Take 1,000 mg by mouth 2 (two) times daily as needed (pain).      Marland Kitchen albuterol (PROVENTIL HFA;VENTOLIN HFA) 108 (90 BASE) MCG/ACT inhaler Inhale 2 puffs into the lungs every 6 (six) hours as needed for wheezing or shortness of breath.  1 Inhaler  3  . cephALEXin (KEFLEX) 250 MG capsule Take 1 capsule (250 mg total) by mouth 4 (four) times daily.  28 capsule  0  . diltiazem (CARDIZEM CD) 240 MG 24 hr capsule Take 240 mg by mouth daily.       . fluticasone (FLOVENT HFA) 44 MCG/ACT inhaler Inhale 2 puffs into the lungs daily.       Marland Kitchen guaifenesin (ROBITUSSIN) 100 MG/5ML syrup Take 100 mg by mouth every 4 (four) hours as needed for cough.      . levothyroxine (SYNTHROID, LEVOTHROID) 75 MCG tablet Take 1 tablet (75 mcg total) by mouth daily.  90 tablet  3  . Melatonin 3 MG TABS Take 3 mg by mouth at bedtime.       . memantine (NAMENDA) 10 MG tablet Take 10 mg by mouth 2 (two) times daily.       . metoCLOPramide (REGLAN) 10 MG tablet Take 1 tablet (10 mg total) by mouth every 6 (six) hours.  30 tablet  0  . omeprazole (PRILOSEC) 20 MG capsule Take 20 mg by mouth daily. Take on and empty stomach- Do no crush.      . pantoprazole (PROTONIX) 40 MG tablet Take 1 tablet (40 mg total) by mouth daily.  30 tablet  0  . polyethylene glycol (MIRALAX / GLYCOLAX) packet Take 17 g by mouth daily. Mix 17gm in 6-8 ounces of liquid daily.       . pravastatin (PRAVACHOL) 20 MG tablet Take 1 tablet (20 mg total) by mouth daily.  30 tablet  3  . Rivastigmine (EXELON) 13.3 MG/24HR PT24 Place 1 patch onto the skin daily.      Marland Kitchen SALINE NASAL SPRAY NA Place 1 spray into both nostrils 2 (two) times daily. 0.65% saline nasal spray      . tiotropium (SPIRIVA) 18 MCG inhalation capsule Place 18 mcg into inhaler and  inhale daily.        . Vaginal Lubricant (REPLENS) GEL Place 1 application vaginally daily as needed (vaginal dryness).      . vitamin E 400 UNIT capsule Take 400 Units by mouth daily.        . Vitamins A & D (VITAMIN A & D) ointment Apply 1 application topically as needed for dry skin.      Marland Kitchen warfarin (COUMADIN) 2 MG tablet Take 2 mg by mouth 2 (two) times a week. 2 mg on Wed and Sat 3 mg Mon, Tues, Thurs, Fri and Sun      . warfarin (COUMADIN) 3 MG tablet Take 3 mg by mouth daily. 2 mg on Wed and Sat 3 mg Mon, Tues, Thurs, Fri and Sun       Scheduled:  . ampicillin-sulbactam (UNASYN) IV  1.5 g Intravenous Q8H  . cefTRIAXone (ROCEPHIN)  IV  1 g Intravenous Q24H  . [START ON 01/08/2014] Chlorhexidine Gluconate Cloth  6 each Topical Q0600  . [START ON 01/08/2014] diltiazem  240 mg Oral Daily  . [START ON 01/08/2014] fluticasone  2 puff Inhalation Daily  . [START ON 01/08/2014] levothyroxine  75 mcg Oral QAC breakfast  . memantine  10 mg Oral BID  . metoCLOPramide  10 mg Oral Q6H  . mupirocin ointment   Nasal BID  . [START ON 01/08/2014] pantoprazole  40 mg Oral Daily  . [START ON 01/08/2014] polyethylene glycol  17 g Oral Daily  . rivastigmine  13.8 mg Transdermal Daily  . senna-docusate  1 tablet Oral QHS  . simvastatin  10 mg Oral q1800  . [START ON 01/08/2014] tiotropium  18 mcg Inhalation Daily   Infusions:  . sodium chloride 50 mL/hr at 01/07/14 1817    Assessment: STAT INR = 1.58  Dr. Nicole Kindred,  [facility] recommended anticoagulation as coumadin with IV heparin infusion until INR is within therapeutic range.  Dr. Verlon Au agreed.  Past h/o recurrent epistaxis noted.  No current symptoms of bleeding reported.    80 y.o female admitted with acute right recurrent CVA and PMH  on chronic coumadin for PE, PAF, CVA and PAD with stents in her lower extremitites who was brought back to ED  today 01/07/14 from her SNF due to decreased responsiveness, left sided weakness and difficulty with  her secretions. Patient was also seen at [facility] on Friday 5/29 and at [facility] ED on 6/1 with similar symptoms. Admitted for  new CVA and subtherapeutic INR on chronic coumadin for h/o PE, Afib, PAD and CVA.  INR = 1.57 on 01/06/14.  Coumadin dose (3mg ) last charted as given at SNF on 01/06/14.   PTA dose is 3 mg daily except 2mg  qWed and Sat.   CT head 01/06/14 No intracranial hemorrhage or acute infarct identified. Unable to do an MRI as she has a Psychologist, forensic.   Goal of Therapy:  Heparin level = 0.3-0.5 INR 2-3 Monitor platelets by anticoagulation protocol: Yes   Plan:  Start IV heparin (no bolus) at 350 units/hr.  Check 8 hr heparin level.  Give Couamdin 5mg  today x1 Daily PT/INR, HL and CBC.  Nicole Cella, RPh Clinical Pharmacist Pager: [phone] 01/07/2014,7:44 PM

## 2014-01-07 NOTE — ED Notes (Signed)
[facility] being called for transport

## 2014-01-07 NOTE — Progress Notes (Signed)
Patient scheduled for CT angio of head, IV extravasated 50 cc of Omni 350.  The IV was removed and ICE applied to patient arm.  Patient had good radial pulse and unable to verbalized any pain.  Patient nurse notified.  If study needs to be repeated it is recommend for the patient to have PICC line placed.

## 2014-01-07 NOTE — H&P (Signed)
[facility]                                                                                    Patient Demographics  Rita Miller, is a 80 y.o. female  MRN: [id number]   DOB - August 01, 1930  Admit Date - 01/07/2014  Outpatient Primary MD for the patient is Penni Homans, MD   With History of -  Past Medical History  Diagnosis Date  . CAD (coronary artery disease)   . History of CVA (cerebrovascular accident)   . Atrial fibrillation     peristent  . Hyperlipemia   . COPD (chronic obstructive pulmonary disease)   . Epistaxis     recurrent  . Osteoporosis   . Hypothyroidism   . History of pulmonary embolism   . History of colonic diverticulitis   . History of breast cancer   . Asthma   . Iron deficiency anemia   . History of transfusion of whole blood   . LBBB (left bundle branch block)     history of  . Aortal stenosis     severe  . Pacemaker     bradycardia s/p pacemaker implant  . PAD (peripheral artery disease)     left leg occluded viabahn stents x 3 left SFA  . Tachycardia-bradycardia syndrome     s/p PPM  . Cancer   . Arthritis   . Stroke   . HTN (hypertension) 09/25/2012  . HTN (hypertension) 09/25/2012      Past Surgical History  Procedure Laterality Date  . Thyroidectomy      due to cancer  . Total hip arthroplasty    . Abdominal hysterectomy    . Mastectomy    . Mitral valve repair    . Intraocular lens insertion      left eye implant  . Pacemaker placement      for tachy/brady syndrome    in for   Chief Complaint  Patient presents with  . Weakness     HPI  Rita Miller  is a 80 y.o. female, with a past medical history of CVA with residual slurring of speech and drooling, as well as PAD with stents in her lower extremities, ovarian cancer, thyroid cancer, and aortic stenosis was taken to [facility] [place] on Friday May 29 for vomiting phlegm, decreased responsiveness and left sided weakness.  According to her daughter, [name], she was  diagnosed with a UTI and discharged back to her SNF.  On Monday 6/1 she was sent to [facility] ED with similar symptoms but discharged home.  Her daughter is uncertain why she was discharged.  She returns to the ED today (6/2) still with decreased responsiveness, left sided weakness and difficulty with her secretions.  She also complains of right sided chest pain that seems m/s in nature.  Per her daughter she had a fall approx 3 weeks ago.  In ER her labs are good but she is noted to have a subtherapeutic INR.  Most recent CT head is negative for acute changes.  We are unable to do an MRI as she has a Psychologist, forensic.  Neuro has been called and a  CT angio of her head is pending.  Review of Systems    In addition to the HPI above No Fever-chills, No Headache, No changes with Vision or hearing, Chronic difficulty with drooling that has become worse now. No Chest pain, Cough or Shortness of Breath, No Abdominal pain, No Nausea, Bowel movements are regular, No Blood in stool or Urine, No dysuria, No new skin rashes or bruises, No new joints pains-aches,  No new weakness, tingling, numbness in any extremity, No recent weight gain or loss, No polyuria, polydypsia or polyphagia, No significant Mental Stressors.  A full 10 point Review of Systems was done, except as stated above, all other Review of Systems were negative.   Social History History  Substance Use Topics  . Smoking status: Never Smoker   . Smokeless tobacco: Never Used  . Alcohol Use: No     Family History Family History  Problem Relation Age of Onset  . Breast cancer    . Colon cancer    . Diabetes    . Hyperlipidemia    . Hypertension    . Heart disease Mother     unknown heart disease     Prior to Admission medications   Medication Sig Start Date End Date Taking? Authorizing Provider  acetaminophen (TYLENOL) 500 MG tablet Take 1,000 mg by mouth 2 (two) times daily as needed (pain).   Yes Historical Provider, MD   albuterol (PROVENTIL HFA;VENTOLIN HFA) 108 (90 BASE) MCG/ACT inhaler Inhale 2 puffs into the lungs every 6 (six) hours as needed for wheezing or shortness of breath. 12/31/13  Yes Mosie Lukes, MD  cephALEXin (KEFLEX) 250 MG capsule Take 1 capsule (250 mg total) by mouth 4 (four) times daily. 01/03/14  Yes Dot Lanes, MD  diltiazem (CARDIZEM CD) 240 MG 24 hr capsule Take 240 mg by mouth daily.    Yes Historical Provider, MD  fluticasone (FLOVENT HFA) 44 MCG/ACT inhaler Inhale 2 puffs into the lungs daily.    Yes Historical Provider, MD  guaifenesin (ROBITUSSIN) 100 MG/5ML syrup Take 100 mg by mouth every 4 (four) hours as needed for cough.   Yes Historical Provider, MD  levothyroxine (SYNTHROID, LEVOTHROID) 75 MCG tablet Take 1 tablet (75 mcg total) by mouth daily. 08/02/13  Yes Mosie Lukes, MD  Melatonin 3 MG TABS Take 3 mg by mouth at bedtime.    Yes Historical Provider, MD  memantine (NAMENDA) 10 MG tablet Take 10 mg by mouth 2 (two) times daily.    Yes Historical Provider, MD  metoCLOPramide (REGLAN) 10 MG tablet Take 1 tablet (10 mg total) by mouth every 6 (six) hours. 01/03/14  Yes Dot Lanes, MD  omeprazole (PRILOSEC) 20 MG capsule Take 20 mg by mouth daily. Take on and empty stomach- Do no crush. 05/15/11  Yes Historical Provider, MD  pantoprazole (PROTONIX) 40 MG tablet Take 1 tablet (40 mg total) by mouth daily. 01/03/14  Yes Dot Lanes, MD  polyethylene glycol (MIRALAX / GLYCOLAX) packet Take 17 g by mouth daily. Mix 17gm in 6-8 ounces of liquid daily.    Yes Historical Provider, MD  pravastatin (PRAVACHOL) 20 MG tablet Take 1 tablet (20 mg total) by mouth daily. 11/26/13  Yes Mosie Lukes, MD  Rivastigmine (EXELON) 13.3 MG/24HR PT24 Place 1 patch onto the skin daily.   Yes Historical Provider, MD  SALINE NASAL SPRAY NA Place 1 spray into both nostrils 2 (two) times daily. 0.65% saline nasal spray   Yes Historical  Provider, MD  tiotropium (SPIRIVA) 18 MCG inhalation  capsule Place 18 mcg into inhaler and inhale daily.     Yes Historical Provider, MD  Vaginal Lubricant (REPLENS) GEL Place 1 application vaginally daily as needed (vaginal dryness).   Yes Historical Provider, MD  vitamin E 400 UNIT capsule Take 400 Units by mouth daily.     Yes Historical Provider, MD  Vitamins A & D (VITAMIN A & D) ointment Apply 1 application topically as needed for dry skin.   Yes Historical Provider, MD  warfarin (COUMADIN) 2 MG tablet Take 2 mg by mouth 2 (two) times a week. 2 mg on Wed and Sat 3 mg Mon, Tues, Thurs, Fri and Sun 02/02/12  Yes Historical Provider, MD  warfarin (COUMADIN) 3 MG tablet Take 3 mg by mouth daily. 2 mg on Wed and Sat 3 mg Mon, Tues, Thurs, Fri and Sun   Yes Historical Provider, MD    Allergies  Allergen Reactions  . Donepezil Hydrochloride     REACTION: heart fluttering  . Morphine Sulfate   . Oxycodone-Acetaminophen     REACTION: Hallucinations  . Percocet [Oxycodone-Acetaminophen]     Physical Exam  Vitals  Blood pressure 181/61, pulse 83, temperature 98.3 F (36.8 C), temperature source Oral, resp. rate 20, height 5' 4.17" (1.63 m), weight 44.5 kg (98 lb 1.7 oz), SpO2 99.00%.   General:  lying in bed in NAD, appears chronically ill, thin, but in good humor - she blows kisses at me.  Psych:  Cooperative.  Orientated to self.  She tells me Zena Amos is president and we are currently at the nursing home.  Neuro:   Left sided droop with smile,  Significantly weaker left sided extremities.  ENT:  Ears appear Normal, Conjunctivae clear, dry mucous membranes.  Neck:  Supple Neck, No JVD, No cervical lymphadenopathy appreciated, No Carotid Bruits.  Respiratory:  Unable to inspire deeply due to chest pain, course breath sounds heard.  Cardiac:  RRR, +systolic murmur.  No Parasternal Heave.  Abdomen:  Positive Bowel Sounds, Abdomen Soft, Non tender, No organomegaly appreciated  Extremities:  Able to move all 4.  Each one warm and dry.   Significant weakness noted on the left compared to the right.   Data Review  CBC  Recent Labs Lab 01/03/14 1320 01/06/14 2133 01/06/14 2145 01/07/14 1218  WBC 5.3 5.7  --  5.3  HGB 10.9* 11.0* 12.6 11.5*  HCT 33.5* 34.2* 37.0 35.3*  PLT 264 242  --  235  MCV 98.2 96.3  --  95.9  MCH 32.0 31.0  --  31.3  MCHC 32.5 32.2  --  32.6  RDW 17.4* 17.4*  --  17.3*  LYMPHSABS 1.2 1.4  --  1.0  MONOABS 0.3 0.4  --  0.4  EOSABS 0.6 0.5  --  0.4  BASOSABS 0.1 0.1  --  0.0   ------------------------------------------------------------------------------------------------------------------  Chemistries   Recent Labs Lab 01/03/14 1320 01/06/14 2133 01/06/14 2145 01/07/14 1218  NA 142 141 144 138  K 4.1 3.8 3.7 3.6*  CL 106 105 102 104  CO2 24 26  --  21  GLUCOSE 96 122* 123* 101*  BUN 23 25* 25* 17  CREATININE 1.10 1.15* 1.30* 0.87  CALCIUM 9.2 9.2  --  8.9  AST 23 22  --  26  ALT 11 10  --  9  ALKPHOS 70 74  --  75  BILITOT 0.7 0.6  --  0.7   ------------------------------------------------------------------------------------------------------------------  Coagulation profile  Recent Labs Lab 01/06/14 2133  INR 1.57*   ------------------------------------------------------------------------------------------------------------------- ------------------------------------------------------------------------------------------------------------------  Cardiac Enzymes  Recent Labs Lab 01/03/14 1320  TROPONINI <0.30   ------------------------------------------------------------------------------------------------------------------ ---------------------------------------------------------------------------------------------------------------  Urinalysis    Component Value Date/Time   COLORURINE YELLOW 01/07/2014 [place] 01/07/2014 1238   LABSPEC 1.015 01/07/2014 1238   PHURINE 7.0 01/07/2014 1238   GLUCOSEU NEGATIVE 01/07/2014 1238   HGBUR NEGATIVE  01/07/2014 [place] 01/07/2014 1238   KETONESUR NEGATIVE 01/07/2014 1238   PROTEINUR 30* 01/07/2014 1238   UROBILINOGEN 1.0 01/07/2014 1238   NITRITE NEGATIVE 01/07/2014 1238   LEUKOCYTESUR NEGATIVE 01/07/2014 1238    ----------------------------------------------------------------------------------------------------------------  Imaging results:   Dg Chest 2 View  01/03/2014   CLINICAL DATA:  Nausea and vomiting  EXAM: CHEST  2 VIEW  COMPARISON:  12/03/2012  FINDINGS: Pacing device is again seen and stable. The cardiac shadow is mildly enlarged but stable. Scarring is noted within the lungs bilaterally. It is increased slightly in the lateral aspect of the right upper lobe somewhat obscured by the pacing pack. Hyperinflation is again seen. No focal infiltrate or sizable effusion is noted. No acute bony abnormality is seen.  IMPRESSION: Increased scarring bilaterally particularly in the right upper lobe.   Electronically Signed   By: Inez Catalina M.D.   On: 01/03/2014 13:12   Ct Head (brain) Wo Contrast  01/06/2014   CLINICAL DATA:  Stroke-like symptoms left-sided weakness and slurred speech with facial droop.  EXAM: CT HEAD WITHOUT CONTRAST  TECHNIQUE: Contiguous axial images were obtained from the base of the skull through the vertex without intravenous contrast.  COMPARISON:  11/07/2009.  FINDINGS: Skull and Sinuses:No fracture destructive process. Gas posterior to the lower right maxillary sinus is likely within the oral cavity. There is circumferential mucosal thickening in the left maxillary sinus, with secretion retention.  Orbits: Bilateral cataract resection.  Brain: Extensive encephalomalacia and gliosis involving the right MCA territory, there is mild sparing at the [facility] border zones. There is no evidence of peri-infarct ischemia. Remote lacunar noted in the right caudate nucleus. There is a small vessel infarct in the upper left cerebellum, appearance and history compatible with  remote injury. Ischemic gliosis which is confluent around the lateral ventricles. No evidence of hemorrhage, hydrocephalus, or mass lesion.  IMPRESSION: 1. No intracranial hemorrhage or acute infarct identified. 2. Remote right MCA territory infarct. 3. Left maxillary sinusitis.   Electronically Signed   By: Jorje Guild M.D.   On: 01/06/2014 22:50   Dg Chest Portable 1 View  01/07/2014   CLINICAL DATA:  Weakness  EXAM: PORTABLE CHEST - 1 VIEW  COMPARISON:  01/03/2014  FINDINGS: Cardiac shadow is stable. A pacing device is again seen. Some persistent changes in the right upper lobe are noted. No new focal abnormality is seen. The lungs remain hyperinflated.  IMPRESSION: Persistent changes in the right upper lobe. No acute abnormality is seen.   Electronically Signed   By: Inez Catalina M.D.   On: 01/07/2014 12:59   Dg Abd 2 Views  01/03/2014   CLINICAL DATA:  Abdominal pain.  Nausea and vomiting.  EXAM: ABDOMEN - 2 VIEW  COMPARISON:  12/19/2013  FINDINGS: No evidence dilated bowel loops. Moderate to large stool burden noted throughout the majority colon. Diffuse atherosclerotic calcification of abdominal aorta and iliac arteries noted. No evidence free air.  IMPRESSION: Nonobstructive bowel gas pattern. Moderate to large stool burden noted.   Electronically Signed   By:  Earle Gell M.D.   On: 01/03/2014 13:07     My personal review of EKG: currently pending.  Paced rhythm from May 29 ekg    Assessment & Plan  Principal Problem:   CVA (cerebral infarction) Active Problems:   UTI (urinary tract infection)   CORONARY ARTERY DISEASE   Atrial fibrillation   CEREBROVASCULAR ACCIDENT, HX OF   MITRAL VALVE REPLACEMENT, HX OF   Anticoagulant long-term use   Dementia   Pacemaker   COPD (chronic obstructive pulmonary disease)   Hypothyroidism    Left sided extremity weakness Likely due to new CVA.  Not evident on CT Admitted to neuro tele with CVA protocol Neurology consulted. CT Angio head  ordered. Coumadin sub therapeutic.  Will request coumadin per pharmacy with lovenox.  UTI Cultures from 5/29 show both E-coli and Enterococcus. Patient has been treated with Keflex at SNF - unfortunately this would not cover the enterococcus Sensitivities indicate both are sensitive to Unasyn.  Start unasyn.  Aspirating. Patient is visibly aspirating on her own secretions. I discussed recurrent aspiration pna with her daughter at bedside. Daughter does not want a PEG tube. Speech requested to recommend safest diet. In the mean time I will request Dysphagia 1.  Hypothyroidism Hx of thyroid cancer On synthroid  Chronic anticoagulation For history of PE as well as PAD (3 stents in left leg), mitral valve replacement, Afib and previous stroke Coumadin per pharmacy.  Lovenox bridge.  COPD Currently stable. Place on nebulizers (I don't believe the patient can effectively use her inhalers).  Dementia Continue namenda and exelon patch.  Hypertension Continue diltiazem Allow for permissive hypertension post stroke. Will add prn hydralazine.  Afib Continue Diltiazem for rate control Coumadin / lovenox.       DVT Prophylaxis coumadin and lovenox.  AM Labs Ordered, also please review Full Orders  Family Communication:   Daughter at bedside.   Code Status:  DNR - reconfirmed with daughter [name] at bedside.  Likely DC to  SNF when appropriate.  Condition:  Guarded.  Time spent in minutes : [place] PA-C on 01/07/2014 at 4:01 PM  Between 7am to 7pm - Pager - [phone]  After 7pm go to [email or web address] - password [email or web address]  And look for the night coverage person covering me after hours  [facility] Office  [phone]

## 2014-01-07 NOTE — ED Notes (Signed)
Pt was discharged from the ED  Last night,  back to the nursing home with poss cva , daughter states that patient is more weak than last night .

## 2014-01-07 NOTE — ED Notes (Addendum)
Spoke with MRI about pacemaker and gave them the serial number and the date it was placed and they stated they would look in the chart and see if they could scan the pt with this particular pacemaker. Family and EDP updated on plan of care.

## 2014-01-07 NOTE — Progress Notes (Signed)
A Palliative Medicine Consult has been received for Patient Rita Miller      DOB: 02-15-1930      FHQ:[id number].We are attempting to respond as quickly as possible to your patient and families needs, however, due to high volume we have not been able to service you in a more timely manner.   We will get this scheduled as quickly as possible.  If in the meantime we can assist with symptom management or other questions via phone please do not hesitate to call our PMT cell phone [phone].   We are sorry for the delay,

## 2014-01-07 NOTE — Consult Note (Signed)
Referring Physician: Nada Libman     Chief Complaint: New-onset left-sided weakness and worsening of slurred speech.  HPI: Rita Miller is an 80 y.o. female history coronary artery disease, previous right cerebral infarction, atrial fibrillation on Coumadin, DVT, hyperlipidemia, hypertension and cardiac pacemaker, presenting with new onset weakness involving left side as well as worsening of slurred speech from previous stroke. Patient was seen for acute urinary tract infection on 01/03/2014 at [facility] and subsequently discharged back to SNF. She was seen yesterday in the emergency room here for similar symptoms and subsequently discharged. She returned today with weakness involving the left side as well as reduced responsiveness. Exact onset of left-sided weakness is unclear. CT scan of her head showed old right CVA but no acute intracranial abnormality. INR on 01/06/2014 was 1.57. Repeat INR results today are pending. NIH stroke score was 11.  LSN: Unclear tPA Given: No: Beyond time window for treatment consideration MRankin: 3  Past Medical History  Diagnosis Date  . CAD (coronary artery disease)   . History of CVA (cerebrovascular accident)   . Atrial fibrillation     peristent  . Hyperlipemia   . COPD (chronic obstructive pulmonary disease)   . Epistaxis     recurrent  . Osteoporosis   . Hypothyroidism   . History of pulmonary embolism   . History of colonic diverticulitis   . History of breast cancer   . Asthma   . Iron deficiency anemia   . History of transfusion of whole blood   . LBBB (left bundle branch block)     history of  . Aortal stenosis     severe  . Pacemaker     bradycardia s/p pacemaker implant  . PAD (peripheral artery disease)     left leg occluded viabahn stents x 3 left SFA  . Tachycardia-bradycardia syndrome     s/p PPM  . Cancer   . Arthritis   . Stroke   . HTN (hypertension) 09/25/2012  . HTN (hypertension) 09/25/2012    Family History  Problem  Relation Age of Onset  . Breast cancer    . Colon cancer    . Diabetes    . Hyperlipidemia    . Hypertension    . Heart disease Mother     unknown heart disease     Medications: I have reviewed the patient's current medications.  ROS: History obtained from patient's daughter and chart review  General ROS: negative for - chills, fatigue, fever, night sweats, weight gain or weight loss Psychological ROS: negative for - behavioral disorder, hallucinations, memory difficulties, mood swings or suicidal ideation Ophthalmic ROS: negative for - blurry vision, double vision, eye pain or loss of vision ENT ROS: negative for - epistaxis, nasal discharge, oral lesions, sore throat, tinnitus or vertigo Allergy and Immunology ROS: negative for - hives or itchy/watery eyes Hematological and Lymphatic ROS: negative for - bleeding problems, bruising or swollen lymph nodes Endocrine ROS: negative for - galactorrhea, hair pattern changes, polydipsia/polyuria or temperature intolerance Respiratory ROS: negative for - cough, hemoptysis, shortness of breath or wheezing Cardiovascular ROS: negative for - chest pain, dyspnea on exertion, edema or irregular heartbeat Gastrointestinal ROS: negative for - abdominal pain, diarrhea, hematemesis, nausea/vomiting or stool incontinence Genito-Urinary ROS: negative for - dysuria, hematuria, incontinence or urinary frequency/urgency Musculoskeletal ROS: negative for - joint swelling or muscular weakness Neurological ROS: as noted in HPI Dermatological ROS: negative for rash and skin lesion changes  Physical Examination: Blood pressure 146/60, pulse 83, temperature  98.3 F (36.8 C), temperature source Oral, resp. rate 20, height 5' 4.17" (1.63 m), weight 44.5 kg (98 lb 1.7 oz), SpO2 99.00%.  Neurologic Examination: Mental Status: Alert, disoriented to correct month and correct age; no acute distress.  Speech moderately dysarthric without evidence of aphasia. Able  to follow commands without difficulty. Cranial Nerves: II-Visual fields were normal. III/IV/VI-Pupils were equal and reacted. Extraocular movements were full and conjugate.    V/VII-no facial numbness; moderate left lower facial weakness. VIII-normal. X-moderate dysarthria; symmetrical palatal movement. Motor: Moderate weakness of left upper and lower extremities proximally and distally; normal strength of right extremities; normal muscle tone throughout. Sensory: Normal throughout. Deep Tendon Reflexes: 2+ and symmetric in upper extremities and 1+ and symmetrical at the knees. Plantars: Extensor on the left and flexor on the right Cerebellar: Normal finger-to-nose testing with use of right upper extremity; severely impaired on the left.  Ct Head (brain) Wo Contrast  01/06/2014   CLINICAL DATA:  Stroke-like symptoms left-sided weakness and slurred speech with facial droop.  EXAM: CT HEAD WITHOUT CONTRAST  TECHNIQUE: Contiguous axial images were obtained from the base of the skull through the vertex without intravenous contrast.  COMPARISON:  11/07/2009.  FINDINGS: Skull and Sinuses:No fracture destructive process. Gas posterior to the lower right maxillary sinus is likely within the oral cavity. There is circumferential mucosal thickening in the left maxillary sinus, with secretion retention.  Orbits: Bilateral cataract resection.  Brain: Extensive encephalomalacia and gliosis involving the right MCA territory, there is mild sparing at the [facility] border zones. There is no evidence of peri-infarct ischemia. Remote lacunar noted in the right caudate nucleus. There is a small vessel infarct in the upper left cerebellum, appearance and history compatible with remote injury. Ischemic gliosis which is confluent around the lateral ventricles. No evidence of hemorrhage, hydrocephalus, or mass lesion.  IMPRESSION: 1. No intracranial hemorrhage or acute infarct identified. 2. Remote right MCA territory infarct. 3.  Left maxillary sinusitis.   Electronically Signed   By: Jorje Guild M.D.   On: 01/06/2014 22:50   Dg Chest Portable 1 View  01/07/2014   CLINICAL DATA:  Weakness  EXAM: PORTABLE CHEST - 1 VIEW  COMPARISON:  01/03/2014  FINDINGS: Cardiac shadow is stable. A pacing device is again seen. Some persistent changes in the right upper lobe are noted. No new focal abnormality is seen. The lungs remain hyperinflated.  IMPRESSION: Persistent changes in the right upper lobe. No acute abnormality is seen.   Electronically Signed   By: Inez Catalina M.D.   On: 01/07/2014 12:59    Assessment: 80 y.o. female with multiple risk factors for stroke including atrial relation and subtherapeutic INR, presenting with acute right recurrent cerebral infarction.  Stroke Risk Factors - atrial fibrillation, hyperlipidemia and hypertension  Plan: 1. HgbA1c, fasting lipid panel 2. MR cannot be obtained because patient has cardiac pacemaker 3. PT consult, OT consult, Speech consult 4. Echocardiogram 5. Carotid dopplers if CT angiogram of head and neck cannot be obtained. 6. Prophylactic therapy-Anticoagulation: Coumadin with IV heparin until INR is within therapeutic range. 7. Risk factor modification 8. Telemetry monitoring   C.R. Nicole Kindred, MD [facility] [phone]  01/07/2014, 6:25 PM

## 2014-01-08 ENCOUNTER — Telehealth: Payer: Self-pay | Admitting: Family Medicine

## 2014-01-08 DIAGNOSIS — Z515 Encounter for palliative care: Secondary | ICD-10-CM

## 2014-01-08 DIAGNOSIS — F0390 Unspecified dementia without behavioral disturbance: Secondary | ICD-10-CM

## 2014-01-08 LAB — CBC
HEMATOCRIT: 36.9 % (ref 36.0–46.0)
HEMOGLOBIN: 11.9 g/dL — AB (ref 12.0–15.0)
MCH: 31.2 pg (ref 26.0–34.0)
MCHC: 32.2 g/dL (ref 30.0–36.0)
MCV: 96.6 fL (ref 78.0–100.0)
Platelets: 232 10*3/uL (ref 150–400)
RBC: 3.82 MIL/uL — ABNORMAL LOW (ref 3.87–5.11)
RDW: 17.4 % — AB (ref 11.5–15.5)
WBC: 7.4 10*3/uL (ref 4.0–10.5)

## 2014-01-08 LAB — LIPID PANEL
CHOL/HDL RATIO: 2.5 ratio
Cholesterol: 186 mg/dL (ref 0–200)
HDL: 73 mg/dL (ref >39)
LDL Cholesterol: 102 mg/dL — ABNORMAL HIGH (ref 0–99)
Triglycerides: 55 mg/dL (ref <150)
VLDL: 11 mg/dL (ref 0–40)

## 2014-01-08 LAB — PROTIME-INR
INR: 1.76 — AB (ref 0.00–1.49)
Prothrombin Time: 20 seconds — ABNORMAL HIGH (ref 11.6–15.2)

## 2014-01-08 LAB — HEMOGLOBIN A1C
Hgb A1c MFr Bld: 5.9 % — ABNORMAL HIGH (ref <5.7)
MEAN PLASMA GLUCOSE: 123 mg/dL — AB (ref <117)

## 2014-01-08 MED ORDER — ENSURE PUDDING PO PUDG
1.0000 | ORAL | Status: DC
  Administered 2014-01-09: 1 via ORAL

## 2014-01-08 MED ORDER — ENSURE COMPLETE PO LIQD
237.0000 mL | Freq: Two times a day (BID) | ORAL | Status: DC
  Administered 2014-01-08 (×2): 237 mL via ORAL

## 2014-01-08 MED ORDER — APIXABAN 2.5 MG PO TABS
2.5000 mg | ORAL_TABLET | Freq: Two times a day (BID) | ORAL | Status: DC
  Administered 2014-01-08 – 2014-01-09 (×3): 2.5 mg via ORAL
  Filled 2014-01-08 (×4): qty 1

## 2014-01-08 MED ORDER — RIVASTIGMINE 13.3 MG/24HR TD PT24
13.3000 mg | MEDICATED_PATCH | Freq: Every day | TRANSDERMAL | Status: DC
  Administered 2014-01-08 – 2014-01-09 (×2): 13.3 mg via TRANSDERMAL
  Filled 2014-01-08 (×2): qty 1

## 2014-01-08 NOTE — Telephone Encounter (Signed)
Needs order for next coumadin draw can fax to [phone]

## 2014-01-08 NOTE — Progress Notes (Signed)
Occupational Therapy Evaluation Patient Details Name: Rita Miller MRN: [id number] DOB: 1930/03/05 Today's Date: 01/08/2014    History of Present Illness Pt is 80 y.o. Female admitted 01/07/14 for decreased responsiveness, left sided weakness, and difficulty with secretions. Of note, pt was admitted 01/06/14 for similar symptoms and d/c to SNF same day.   Clinical Impression   Pt unable to provide PLOF and calls to [facility] (SNF) were not returned. Pt requires assist for ADLs and likely was having assistance at [facility]. Pt declined OOB activities and became upset when OT attempted EOB. Brief assessment performed at bed level. Pt has no acute OT needs and will defer OT to SNF.     Follow Up Recommendations  SNF;Supervision/Assistance - 24 hour    Equipment Recommendations  None recommended by OT       Precautions / Restrictions Precautions Precautions: Fall      Mobility Bed Mobility Overal bed mobility: Needs Assistance Bed Mobility: Rolling;Supine to Sit Rolling: Mod assist   Supine to sit: Mod assist     General bed mobility comments: Not assessed as pt declined.   Transfers Overall transfer level: Needs assistance Equipment used: None Transfers: Squat Pivot Transfers     Squat pivot transfers: Mod assist (times 2)     General transfer comment: Not assessed as pt declined.         ADL Overall ADL's : Needs assistance/impaired Eating/Feeding: Set up;Sitting;Cueing for safety   Grooming: Wash/dry face;Set up;Sitting;Cueing for safety;Cueing for sequencing   Upper Body Bathing: Maximal assistance;Sitting   Lower Body Bathing: Total assistance   Upper Body Dressing : Maximal assistance;Sitting   Lower Body Dressing: Total assistance                 General ADL Comments: Pt declined OOB activities and became upset when attempted to move EOB. Brief assessment performed at bedside. Unable to determine PLOF however feel that pt was likely receiving  assistance for ADLs. Per PT note, pt required Mod Assist for squat-pivot transfers and for bed mobility. Pt repeatedly asked if we were at [facility] (SNF).      Vision  Difficult to assess. Pt does not report changes in vision.                   Perception Perception Perception Tested?: No   Praxis Praxis Praxis tested?: Not tested    Pertinent Vitals/Pain NAD     Hand Dominance Right   Extremity/Trunk Assessment Upper Extremity Assessment Upper Extremity Assessment: LUE deficits/detail;Generalized weakness LUE Deficits / Details: decreased fine motor coordination          Communication Communication Communication: Expressive difficulties   Cognition Arousal/Alertness: Awake/alert Behavior During Therapy: WFL for tasks assessed/performed Overall Cognitive Status: No family/caregiver present to determine baseline cognitive functioning                                Home Living Family/patient expects to be discharged to:: Skilled nursing facility   Available Help at Discharge: [place] Type of Home: [place]                                  Prior Functioning/Environment Level of Independence: Needs assistance    ADL's / Homemaking Assistance Needed: Unable to determine PLOF, however feel that pt likely required high level of care.  Comments: Enid Baas to determine PLOF, however no return call was provided.                              End of Session Equipment Utilized During Treatment: Oxygen Nurse Communication: Other (comment) (pt unable to remember how to use call bell)  Activity Tolerance: Patient limited by fatigue Patient left: in bed;with call bell/phone within reach;Other (comment) (educated pt on use of call bell x4 with repeated questioning)   Time: 3419-3790 OT Time Calculation (min): 12 min Charges:  OT General Charges $OT Visit: 1 Procedure OT  Evaluation $Initial OT Evaluation Tier I: 1 Procedure  Juluis Rainier [phone] 01/08/2014, 6:30 PM

## 2014-01-08 NOTE — Progress Notes (Addendum)
Stroke Team Progress Note  HISTORY Rita Miller is a 80 y.o. female, with a past medical history of CVA with residual slurring of speech and drooling, as well as PAD with stents in her lower extremities, ovarian cancer, thyroid cancer, and aortic stenosis [place] for vomiting phlegm, decreased responsiveness and left sided weakness. According to her daughter, Rita Miller, she was diagnosed with a UTI and discharged back to her SNF. On Monday 6/1 she was sent to [facility] ED with similar symptoms but discharged home. Her daughter is uncertain why she was discharged. She returns to the ED today (6/2) still with decreased responsiveness, left sided weakness and difficulty with her secretions. She also complains of right sided chest pain that seems m/s in nature. Per her daughter she had a fall approx 3 weeks ago.  In ER her labs are good but she is noted to have a subtherapeutic INR. Most recent CT head is negative for acute changes. We are unable to do an MRI as she has a Psychologist, forensic. Neuro has been called and a CT angio of her head is pending.  Patient was not administered TPA secondary to coumadin tx. She was admitted to the Internal Medicine Service for further evaluation and treatment. Neurology is consulting.   SUBJECTIVE No acute events overnight. Family is at the bedside. Patient is alert, smiling.   OBJECTIVE Most recent Vital Signs: Filed Vitals:   01/08/14 0350 01/08/14 0600 01/08/14 0859 01/08/14 0928  BP: 145/61 174/65  120/62  Pulse: 94 85  86  Temp: 98 F (36.7 C) 99 F (37.2 C)  97.5 F (36.4 C)  TempSrc: Oral Oral  Oral  Resp: 18 18  20   Height:      Weight:      SpO2: 97% 97% 97% 97%   CBG (last 3)   Recent Labs  01/06/14 2122  GLUCAP 143*    IV Fluid Intake:   . sodium chloride 50 mL/hr at 01/07/14 1817    MEDICATIONS  . ampicillin-sulbactam (UNASYN) IV  1.5 g Intravenous Q8H  . apixaban  2.5 mg Oral BID  . cefTRIAXone  (ROCEPHIN)  IV  1 g Intravenous Q24H  . Chlorhexidine Gluconate Cloth  6 each Topical Q0600  . diltiazem  240 mg Oral Daily  . feeding supplement (ENSURE COMPLETE)  237 mL Oral BID BM  . feeding supplement (ENSURE)  1 Container Oral Q24H  . fluticasone  2 puff Inhalation Daily  . levothyroxine  75 mcg Oral QAC breakfast  . memantine  10 mg Oral BID  . metoCLOPramide  10 mg Oral Q6H  . mupirocin ointment   Nasal BID  . pantoprazole  40 mg Oral Daily  . polyethylene glycol  17 g Oral Daily  . Rivastigmine  13.3 mg Transdermal Daily  . senna-docusate  1 tablet Oral QHS  . simvastatin  10 mg Oral q1800  . tiotropium  18 mcg Inhalation Daily   PRN:  acetaminophen, hydrALAZINE, ipratropium-albuterol, sodium chloride, vitamin A & D  Diet:  Dysphagia thin liquids Activity:   Up with assistance DVT Prophylaxis:  Pt on Apixaban  CLINICALLY SIGNIFICANT STUDIES Basic Metabolic Panel:  Recent Labs Lab 01/06/14 2133 01/06/14 2145 01/07/14 1218 01/07/14 1630  NA 141 144 138  --   K 3.8 3.7 3.6*  --   CL 105 102 104  --   CO2 26  --  21  --   GLUCOSE 122* 123*  101*  --   BUN 25* 25* 17  --   CREATININE 1.15* 1.30* 0.87 0.88  CALCIUM 9.2  --  8.9  --    Liver Function Tests:  Recent Labs Lab 01/06/14 2133 01/07/14 1218  AST 22 26  ALT 10 9  ALKPHOS 74 75  BILITOT 0.6 0.7  PROT 8.2 8.2  ALBUMIN 3.3* 3.3*   CBC:  Recent Labs Lab 01/06/14 2133  01/07/14 1218 01/07/14 1630 01/08/14 0500  WBC 5.7  --  5.3 5.0 7.4  NEUTROABS 3.3  --  3.6  --   --   HGB 11.0*  < > 11.5* 11.7* 11.9*  HCT 34.2*  < > 35.3* 35.1* 36.9  MCV 96.3  --  95.9 95.6 96.6  PLT 242  --  235 231 232  < > = values in this interval not displayed. Coagulation:  Recent Labs Lab 01/06/14 2133 01/07/14 1907 01/08/14 0500  LABPROT 18.3* 18.4* 20.0*  INR 1.57* 1.58* 1.76*   Cardiac Enzymes:  Recent Labs Lab 01/03/14 1320  TROPONINI <0.30   Urinalysis:  Recent Labs Lab 01/03/14 1435 01/07/14 1238   COLORURINE YELLOW YELLOW  LABSPEC 1.017 1.015  PHURINE 7.0 7.0  GLUCOSEU NEGATIVE NEGATIVE  HGBUR NEGATIVE NEGATIVE  BILIRUBINUR NEGATIVE NEGATIVE  KETONESUR NEGATIVE NEGATIVE  PROTEINUR 30* 30*  UROBILINOGEN 2.0* 1.0  NITRITE POSITIVE* NEGATIVE  LEUKOCYTESUR MODERATE* NEGATIVE   Lipid Panel    Component Value Date/Time   CHOL 186 01/08/2014 0448   TRIG 55 01/08/2014 0448   HDL 73 01/08/2014 0448   CHOLHDL 2.5 01/08/2014 0448   VLDL 11 01/08/2014 0448   LDLCALC 102* 01/08/2014 0448   HgbA1C  Lab Results  Component Value Date   HGBA1C 5.9* 01/08/2014    Urine Drug Screen:   No results found for this basename: labopia, cocainscrnur, labbenz, amphetmu, thcu, labbarb    Alcohol Level: No results found for this basename: ETH,  in the last 168 hours  Ct Head (brain) Wo Contrast  01/06/2014    IMPRESSION: 1. No intracranial hemorrhage or acute infarct identified. 2. Remote right MCA territory infarct. 3. Left maxillary sinusitis.   Ct Angio Head W/cm &/or Wo Cm  01/07/2014 IMPRESSION: 1. Stable non contrast CT appearance of the brain since 01/06/2014. Chronic large right MCA infarct and small chronic left cerebellar lacunar infarct. 2. CTA attempt was aborted following extravasation of IV contrast, and no additional IV access could be obtained.     Dg Chest 2 View  01/07/2014  IMPRESSION: Emphysema and pulmonary scarring without definite acute overlying pulmonary process.     Dg Chest Portable 1 View  01/07/2014   IMPRESSION: Persistent changes in the right upper lobe. No acute abnormality is seen.       EKG  ventricular paced rhythm with prolonged AV conduction   Therapy Recommendations PT recs SNF  Physical Exam   Elderly female not in distress.Awake alert. Afebrile. Head is nontraumatic. Neck is supple without bruit. Hearing is normal. Cardiac exam no murmur or gallop. Lungs are clear to auscultation. Distal pulses are well felt. Neurological Exam : Awake alert oriented x 3 dysarthric  speech  .decrease blink to threat on left side. Mild left lower face asymmetry. Tongue midline. Moderate left hemiparesis 3/5  Mild diminished fine finger movements on left. Orbits right over left upper extremity. Mild left grip weak..mild left hip flexors and ankle dorsiflexors weakness. Normal sensation . Normal coordination.gait not tested ASSESSMENT/PLAN Ms. Rita Miller is a 80 y.o.  female with PMH HTN, HLD, Atrial fibrillation on coumadin, MV repair, CAD, tachy-brady syndrome with permanent pacemaker, hypothyroidism, prior stroke presenting with decreased responsiveness, L sided weakness and dysarthria. IV tPA not given due to patient on warfarin. CT shows chronic R MCA infarct, no acute process. On warfarin prior to admission. Now on Apixaban for secondary stroke prevention. Stroke work up underway.  New stroke vs recrudescence of old R MCA stroke CT shows NAICP Pt unable to have MRI due to pacemaker LDL 102 HgbA1C 5.9 Pt DNR, family wishes no G tube, considering hospice Palliative care consulting, rest of workup pending their [facility] day # 1  SIGNED Delbert Phenix, MSN, ANP-C, [place], [place]  I have personally obtained a history, examined the patient, evaluated imaging results, and formulated the assessment and plan of care. I agree with the above. Antony Contras, MD   To contact Stroke Continuity provider, please refer to [email or web address] After hours, contact General Neurology

## 2014-01-08 NOTE — Telephone Encounter (Signed)
Needs PT/INR in 2 weeks.

## 2014-01-08 NOTE — Progress Notes (Signed)
INITIAL NUTRITION ASSESSMENT  DOCUMENTATION CODES Per approved criteria  -Severe malnutrition in the context of chronic illness -Underweight  Pt meets criteria for SEVERE MALNUTRITION in the context of CHRONIC ILLNESS as evidenced by severe muscle wasting and estimated energy intake <75% of estimated energy needs for > 1 month.  INTERVENTION: Provide Magic Cup ice cream BID with meals, each supplement provides 290 kcal and 9 grams of protein Provide Ensure Pudding once daily, 170 kcal, 4 grams protein Provide Ensure Complete BID, each provides 350 kcal and 13 grams of protein  NUTRITION DIAGNOSIS: Increased nutrient needs related to malnutrition and underweight status as evidenced by BMI of 16.8 and muscle wasting.   Goal: Pt to meet >/= 90% of their estimated nutrition needs   Monitor:  PO intake, weight, labs, I/O's  Reason for Assessment: Malnutrition Screening Tool, score of 2  80 y.o. female  Admitting Dx: CVA (cerebral infarction)  ASSESSMENT: 80 y.o. female, with a past medical history of CVA with residual slurring of speech and drooling, as well as PAD with stents in her lower extremities, ovarian cancer, thyroid cancer, and aortic stenosis was taken to [place] Friday [place] for vomiting phlegm, decreased responsiveness and left sided weakness.  RD spoke with SLP who reports that PTA pt was on a regular diet but, was not eating much due to chewing/swallowing difficulty and inability to eat certain foods due to her diverticulitis. Pt is being put on a pureed foods diet which will hopefully help improve PO intake. Per pt's daughter pt was only eating a few bites at each meal but, pt states she was eating well and has a good appetite. Pt is underweight but, weight has been fairly stable for the past 2 months.   Nutrition Focused Physical Exam:  Subcutaneous Fat:  Orbital Region: mild wasting Upper Arm Region: mild wasting Thoracic and Lumbar Region:  NA  Muscle:  Temple Region: severe wasting Clavicle Bone Region: severe wasting Clavicle and Acromion Bone Region: moderate wasting Scapular Bone Region: NA Dorsal Hand: moderate wasting Patellar Region: mild wasting Anterior Thigh Region: mild wasting Posterior Calf Region: mild wasting  Edema: none  Height: Ht Readings from Last 1 Encounters:  01/07/14 5' 4.17" (1.63 m)    Weight: Wt Readings from Last 1 Encounters:  01/07/14 98 lb 1.7 oz (44.5 kg)    Ideal Body Weight: 120 lbs  % Ideal Body Weight: 82%  Wt Readings from Last 10 Encounters:  01/07/14 98 lb 1.7 oz (44.5 kg)  01/03/14 98 lb (44.453 kg)  12/27/13 98 lb (44.453 kg)  12/19/13 98 lb 8 oz (44.679 kg)  11/26/13 102 lb 1.9 oz (46.321 kg)  11/14/13 97 lb (43.999 kg)  08/30/13 105 lb (47.628 kg)  08/02/13 105 lb 0.6 oz (47.646 kg)  05/09/13 103 lb (46.72 kg)  05/01/13 103 lb 1.9 oz (46.775 kg)    Usual Body Weight: unknown  % Usual Body Weight: NA  BMI:  Body mass index is 16.75 kg/(m^2). (Underweight)  Estimated Nutritional Needs: Kcal: 1400-1600 Protein: 60-70 grams Fluid: 1.4-1.6 L/day  Skin: intact  Diet Order: Dysphagia  EDUCATION NEEDS: -No education needs identified at this time  No intake or output data in the 24 hours ending 01/08/14 1231  Last BM: 6/3  Labs:   Recent Labs Lab 01/03/14 1320 01/06/14 2133 01/06/14 2145 01/07/14 1218 01/07/14 1630  NA 142 141 144 138  --   K 4.1 3.8 3.7 3.6*  --   CL  106 105 102 104  --   CO2 24 26  --  21  --   BUN 23 25* 25* 17  --   CREATININE 1.10 1.15* 1.30* 0.87 0.88  CALCIUM 9.2 9.2  --  8.9  --   GLUCOSE 96 122* 123* 101*  --     CBG (last 3)   Recent Labs  01/06/14 2122  GLUCAP 143*    Scheduled Meds: . ampicillin-sulbactam (UNASYN) IV  1.5 g Intravenous Q8H  . apixaban  2.5 mg Oral BID  . cefTRIAXone (ROCEPHIN)  IV  1 g Intravenous Q24H  . Chlorhexidine Gluconate Cloth  6 each Topical Q0600  . diltiazem  240 mg  Oral Daily  . fluticasone  2 puff Inhalation Daily  . levothyroxine  75 mcg Oral QAC breakfast  . memantine  10 mg Oral BID  . metoCLOPramide  10 mg Oral Q6H  . mupirocin ointment   Nasal BID  . pantoprazole  40 mg Oral Daily  . polyethylene glycol  17 g Oral Daily  . Rivastigmine  13.3 mg Transdermal Daily  . senna-docusate  1 tablet Oral QHS  . simvastatin  10 mg Oral q1800  . tiotropium  18 mcg Inhalation Daily    Continuous Infusions: . sodium chloride 50 mL/hr at 01/07/14 1817    Past Medical History  Diagnosis Date  . CAD (coronary artery disease)   . History of CVA (cerebrovascular accident)   . Atrial fibrillation     peristent  . Hyperlipemia   . COPD (chronic obstructive pulmonary disease)   . Epistaxis     recurrent  . Osteoporosis   . Hypothyroidism   . History of pulmonary embolism   . History of colonic diverticulitis   . History of breast cancer   . Asthma   . Iron deficiency anemia   . History of transfusion of whole blood   . LBBB (left bundle branch block)     history of  . Aortal stenosis     severe  . Pacemaker     bradycardia s/p pacemaker implant  . PAD (peripheral artery disease)     left leg occluded viabahn stents x 3 left SFA  . Tachycardia-bradycardia syndrome     s/p PPM  . Cancer   . Arthritis   . Stroke   . HTN (hypertension) 09/25/2012  . HTN (hypertension) 09/25/2012    Past Surgical History  Procedure Laterality Date  . Thyroidectomy      due to cancer  . Total hip arthroplasty    . Abdominal hysterectomy    . Mastectomy    . Mitral valve repair    . Intraocular lens insertion      left eye implant  . Pacemaker placement      for tachy/brady syndrome    Pryor Ochoa RD, LDN Inpatient Clinical Dietitian Pager: [phone] After Hours Pager: [phone]

## 2014-01-08 NOTE — Discharge Instructions (Addendum)
Follow with Primary MD Penni Homans, MD in 7 days   Get CBC, CMP, checked 7 days by Primary MD and again as instructed by your Primary MD.     Activity: As tolerated with Full fall precautions use walker/cane & assistance as needed   Disposition Home    Diet: Heart Healthy Dysphagia 1 with  feeding assistance and aspiration precautions   For Heart failure patients - Check your Weight same time everyday, if you gain over 2 pounds, or you develop in leg swelling, experience more shortness of breath or chest pain, call your Primary MD immediately. Follow Cardiac Low Salt Diet and 1.8 lit/day fluid restriction.   On your next visit with her primary care physician please Get Medicines reviewed and adjusted.  Please request your Prim.MD to go over all Hospital Tests and Procedure/Radiological results at the follow up, please get all Hospital records sent to your Prim MD by signing hospital release before you go home.   If you experience worsening of your admission symptoms, develop shortness of breath, life threatening emergency, suicidal or homicidal thoughts you must seek medical attention immediately by calling 911 or calling your MD immediately  if symptoms less severe.  You Must read complete instructions/literature along with all the possible adverse reactions/side effects for all the Medicines you take and that have been prescribed to you. Take any new Medicines after you have completely understood and accpet all the possible adverse reactions/side effects.   Do not drive and provide baby sitting services if your were admitted for syncope or siezures until you have seen by Primary MD or a Neurologist and advised to do so again.  Do not drive when taking Pain medications.    Do not take more than prescribed Pain, Sleep and Anxiety Medications  Special Instructions: If you have smoked or chewed Tobacco  in the last 2 yrs please stop smoking, stop any regular Alcohol  and or any  Recreational drug use.  Wear Seat belts while driving.   Please note  You were cared for by a hospitalist during your hospital stay. If you have any questions about your discharge medications or the care you received while you were in the hospital after you are discharged, you can call the unit and asked to speak with the hospitalist on call if the hospitalist that took care of you is not available. Once you are discharged, your primary care physician will handle any further medical issues. Please note that NO REFILLS for any discharge medications will be authorized once you are discharged, as it is imperative that you return to your primary care physician (or establish a relationship with a primary care physician if you do not have one) for your aftercare needs so that they can reassess your need for medications and monitor your lab values.

## 2014-01-08 NOTE — Progress Notes (Signed)
Full note to follow:  I met today with Ms. Goughnour's daughter, [name]. We discussed her overall health decline and how this stroke has accelerated the decline. We discussed the risk of aspiration and how this impacts for decline. [name] confirms DNR and no feeding tube. [name] is considering a comfort approach with further decline. We discussed hospice care and [name] says that she has been told that [facility] will not allow hospice (even though she has Medicare and Medicaid). [name] is open to hospice if it is available to them - I will investigate further. [name] was very tearful but says that she won't let her mother suffer. She was even bringing her mother home with her every weekend up until admission. I gave [name] a Hard Choices booklet and MOST form to look over. I will continue to follow and support.   [name], NP Palliative Medicine Team Pager # [phone] (M-F 8a-5p) Team Phone # [phone] (Nights/Weekends)

## 2014-01-08 NOTE — Progress Notes (Signed)
Spoke with pts daughter via telephone for PICC consent.  Dtr states prefers to wait until she speaks with doctors. Dtr states the CT has been done already and the pt has PIV via [place].   Notified of difficulty with obtaining PIV and benefits of PICC.  Instructed dtr to notify RN if she decides to proceed with PICC after speaking with MD's.  Staff RN notified of the conversation and asked to call IV Team with dtrs decision.

## 2014-01-08 NOTE — Progress Notes (Signed)
Speech Language Pathology Treatment: Dysphagia  Patient Details Name: Julicia Krieger MRN: [id number] DOB: 12-10-1929 Today's Date: 01/08/2014 Time: 3419-6222 SLP Time Calculation (min): 13 min  Assessment / Plan / Recommendation Clinical Impression  Pt seen for diet tolerance and possible advancement, consuming little PO and politely declining thin liquids. Pt and daughter report that they prefer the current pureed diet, however they would like to have more say in what comes on the trays. SLP provided education regarding food options while in the hospital that are consistent with Dys 1 textures and thin liquids. SLP spoke with Neoma Laming from dining services via phone to confirm choices and ability to do so, and daughter then placed lunch order. Continue plan of care.   HPI HPI: 80 year old female with history of CAD, atrial fibrillation. She is currently a resident of an extended care facility. She was sent here for evaluation of weakness and concern of a CVA.  Dtr reports her mother lives at [facility]; facility sending regular meals which she could not chew given ill-fitting dentures.     Pertinent Vitals N/A  SLP Plan  Continue with current plan of care    Recommendations Diet recommendations: Dysphagia 1 (puree);Thin liquid Liquids provided via: Cup;Straw Medication Administration: Whole meds with puree Supervision: Patient able to self feed;Full supervision/cueing for compensatory strategies Compensations: Small sips/bites;Slow rate Postural Changes and/or Swallow Maneuvers: Seated upright 90 degrees              Oral Care Recommendations: Oral care BID Follow up Recommendations: None Plan: Continue with current plan of care    GO      Germain Osgood, M.A. CCC-SLP [phone]  Germain Osgood 01/08/2014, 12:07 PM

## 2014-01-08 NOTE — Progress Notes (Signed)
CARE MANAGEMENT NOTE 01/08/2014  Patient:  Rita Miller, Rita Miller   Account Number:  [id number]  Date Initiated:  01/08/2014  Documentation initiated by:  Olga Coaster  Subjective/Objective Assessment:   ADMITTED FOR STROKE WORKUP     Action/Plan:   PATIENT RESIDES IN A NURSING FACILITY; [place] WORKER REFERRAL PLACED   Anticipated DC Date:  01/11/2014   Anticipated DC Plan:  SKILLED NURSING FACILITY  In-house referral  Clinical Social Worker      DC Planning Services  CM consult             Status of service:  In process, will continue to follow Medicare Important Message given?   (If response is "NO", the following Medicare IM given date fields will be blank)  Per UR Regulation:  Reviewed for med. necessity/level of care/duration of stay  Comments:  6/3/2015Mindi Slicker RN,BSN,MHA [phone]

## 2014-01-08 NOTE — Consult Note (Signed)
Patient Rita Miller      DOB: 01-01-1930      HQI:[id number]     Consult Note from the Palliative Medicine Team at [facility] Requested by: Dr. Verlon Au    PCP: Penni Homans, MD Reason for Consultation: GOC/hospice option  Phone Number:[phone]  Assessment of patients Current state: Ms. Demary is a 80 yo female with possible stroke and dysphagia/aspiration. PMH significant for stroke, COPD, dementia, CAD, PE, cancer (breast, thyroid), PAD, pacemaker. She is losing weight and failure to thrive. Prognosis is poor and likely <6 months.   I met today with Ms. Friedt's daughter, [name] [phone], [phone]). We discussed her overall health decline and how this stroke has accelerated the decline. We discussed the risk of aspiration and how this impacts for decline. [name] confirms DNR and no feeding tube. [name] is considering a comfort approach especially with further decline. We discussed hospice care and [name] says that she has been told that [facility] will not allow hospice (even though she has Medicare and Medicaid). [name] is open to hospice if it is available to them - I will investigate further. [name] was very tearful but says that she won't let her mother suffer. She was even bringing her mother home with her every weekend up until admission. I gave [name] a Hard Choices booklet and MOST form to look over. I will continue to follow and support.     Goals of Care: 1.  Code Status: DNR   2. Scope of Treatment: 1. Vital Signs: yes 2. Respiratory/Oxygen: yes 3. Nutritional Support/Tube Feeds: no 4. Antibiotics: yes 5. IVF: yes   4. Disposition: Would like for hospice or at least palliative care services to follow at SNF.    3. Symptom Management:   1. Pain: Acetaminophen prn.  2. Bowel Regimen: Miralax and Senna-S daily.  3. Weakness: PT/OT/SLP following.   4. Psychosocial: Emotional support provided to patient and daughter.    Patient Documents Completed or  Given: Document Given Completed  Advanced Directives Pkt    MOST yes   DNR    Gone from My [facility]ight    Hard Choices yes     Brief HPI: 80 yo female with stroke and failure to thrive.    ROS: Denies pain, constipation, nausea. Says she is "bored" and needs some company/visitors. Daughter at bedside.     PMH:  Past Medical History  Diagnosis Date  . CAD (coronary artery disease)   . History of CVA (cerebrovascular accident)   . Atrial fibrillation     peristent  . Hyperlipemia   . COPD (chronic obstructive pulmonary disease)   . Epistaxis     recurrent  . Osteoporosis   . Hypothyroidism   . History of pulmonary embolism   . History of colonic diverticulitis   . History of breast cancer   . Asthma   . Iron deficiency anemia   . History of transfusion of whole blood   . LBBB (left bundle branch block)     history of  . Aortal stenosis     severe  . Pacemaker     bradycardia s/p pacemaker implant  . PAD (peripheral artery disease)     left leg occluded viabahn stents x 3 left SFA  . Tachycardia-bradycardia syndrome     s/p PPM  . Cancer   . Arthritis   . Stroke   . HTN (hypertension) 09/25/2012  . HTN (hypertension) 09/25/2012     PSH: Past Surgical History  Procedure Laterality Date  . Thyroidectomy      due to cancer  . Total hip arthroplasty    . Abdominal hysterectomy    . Mastectomy    . Mitral valve repair    . Intraocular lens insertion      left eye implant  . Pacemaker placement      for tachy/brady syndrome   I have reviewed the [place] and SH and  If appropriate update it with new information. Allergies  Allergen Reactions  . Donepezil Hydrochloride     REACTION: heart fluttering  . Morphine Sulfate   . Oxycodone-Acetaminophen     REACTION: Hallucinations  . Percocet [Oxycodone-Acetaminophen]    Scheduled Meds: . ampicillin-sulbactam (UNASYN) IV  1.5 g Intravenous Q8H  . apixaban  2.5 mg Oral BID  . cefTRIAXone (ROCEPHIN)  IV  1 g  Intravenous Q24H  . Chlorhexidine Gluconate Cloth  6 each Topical Q0600  . diltiazem  240 mg Oral Daily  . feeding supplement (ENSURE COMPLETE)  237 mL Oral BID BM  . feeding supplement (ENSURE)  1 Container Oral Q24H  . fluticasone  2 puff Inhalation Daily  . levothyroxine  75 mcg Oral QAC breakfast  . memantine  10 mg Oral BID  . metoCLOPramide  10 mg Oral Q6H  . mupirocin ointment   Nasal BID  . pantoprazole  40 mg Oral Daily  . polyethylene glycol  17 g Oral Daily  . Rivastigmine  13.3 mg Transdermal Daily  . senna-docusate  1 tablet Oral QHS  . simvastatin  10 mg Oral q1800  . tiotropium  18 mcg Inhalation Daily   Continuous Infusions: . sodium chloride 50 mL/hr at 01/07/14 1817   PRN Meds:.acetaminophen, hydrALAZINE, ipratropium-albuterol, sodium chloride, vitamin A & D    BP 120/62  Pulse 86  Temp(Src) 97.5 F (36.4 C) (Oral)  Resp 20  Ht 5' 4.17" (1.63 m)  Wt 44.5 kg (98 lb 1.7 oz)  BMI 16.75 kg/m2  SpO2 97%   PPS: 30% at best  No intake or output data in the 24 hours ending 01/08/14 1525 LBM: 01/08/14                         Physical Exam:  General: NAD, pleasant, cachectic, frail HEENT: Severe muscle atrophy/wasting, no JVD, moist mucous membranes Chest: CTA throughout, diminished bases, nasal cannula (2L at home), no labored breathing CVS: RRR, S1 S2 Abdomen: Soft, NT, ND, +BS Ext: MAE, Lt sided weakness but able to break gravity, warm to touch Neuro: Awake, alert, oriented to self, believes she is in "nursing home," follows commands  Labs: CBC    Component Value Date/Time   WBC 7.4 01/08/2014 0500   WBC 6.6 08/30/2013 1049   RBC 3.82* 01/08/2014 0500   RBC 3.64* 08/30/2013 1049   RBC 3.25* 09/21/2011 1402   HGB 11.9* 01/08/2014 0500   HGB 11.2* 08/30/2013 1049   HCT 36.9 01/08/2014 0500   HCT 35.9 08/30/2013 1049   PLT 232 01/08/2014 0500   PLT 233 08/30/2013 1049   MCV 96.6 01/08/2014 0500   MCV 99 08/30/2013 1049   MCH 31.2 01/08/2014 0500   MCH 30.8  08/30/2013 1049   MCHC 32.2 01/08/2014 0500   MCHC 31.2* 08/30/2013 1049   RDW 17.4* 01/08/2014 0500   RDW 14.9 08/30/2013 1049   LYMPHSABS 1.0 01/07/2014 1218   LYMPHSABS 1.0 08/30/2013 1049   MONOABS 0.4 01/07/2014 1218   EOSABS 0.4 01/07/2014  1218   EOSABS 0.1 08/30/2013 1049   BASOSABS 0.0 01/07/2014 1218   BASOSABS 0.0 08/30/2013 1049    BMET    Component Value Date/Time   NA 138 01/07/2014 1218   K 3.6* 01/07/2014 1218   CL 104 01/07/2014 1218   CO2 21 01/07/2014 1218   GLUCOSE 101* 01/07/2014 1218   BUN 17 01/07/2014 1218   CREATININE 0.88 01/07/2014 1630   CREATININE 1.00 12/19/2013 1232   CALCIUM 8.9 01/07/2014 1218   GFRNONAA 60* 01/07/2014 1630   GFRNONAA 49* 11/04/2010 1120   GFRAA 70* 01/07/2014 1630   GFRAA 59* 11/04/2010 1120    CMP     Component Value Date/Time   NA 138 01/07/2014 1218   K 3.6* 01/07/2014 1218   CL 104 01/07/2014 1218   CO2 21 01/07/2014 1218   GLUCOSE 101* 01/07/2014 1218   BUN 17 01/07/2014 1218   CREATININE 0.88 01/07/2014 1630   CREATININE 1.00 12/19/2013 1232   CALCIUM 8.9 01/07/2014 1218   PROT 8.2 01/07/2014 1218   ALBUMIN 3.3* 01/07/2014 1218   AST 26 01/07/2014 1218   ALT 9 01/07/2014 1218   ALKPHOS 75 01/07/2014 1218   BILITOT 0.7 01/07/2014 1218   GFRNONAA 60* 01/07/2014 1630   GFRNONAA 49* 11/04/2010 1120   GFRAA 70* 01/07/2014 1630   GFRAA 59* 11/04/2010 1120     Time In Time Out Total Time Spent with Patient Total Overall Time  1445 1540 60mn 573m    Greater than 50%  of this time was spent counseling and coordinating care related to the above assessment and plan.  AlVinie SillNP Palliative Medicine Team Pager # [phone]-F 8a-5p) Team Phone # [phone]/Weekends)

## 2014-01-08 NOTE — Clinical Social Work Psychosocial (Signed)
Clinical Social Work Department BRIEF PSYCHOSOCIAL ASSESSMENT 01/08/2014  Patient:  Rita Miller, Rita Miller     Account Number:  [id number]     Admit date:  02/28/2014  Clinical Social Worker:  Delrae Sawyers  Date/Time:  01/08/2014 04:12 PM  Referred by:  Physician  Date Referred:  01/08/2014 Referred for  SNF Placement   Other Referral:   none.   Interview type:  Family Other interview type:   CSW spoke with pt's daughter, [name] [[phone]]    PSYCHOSOCIAL DATA Living Status:  FACILITY Admitted from facility:  [facility] Level of care:  [place] Primary support name:  [name] Primary support relationship to patient:  CHILD, ADULT Degree of support available:   Strong support system.    CURRENT CONCERNS Current Concerns  Post-Acute Placement   Other Concerns:   none.    SOCIAL WORK ASSESSMENT / PLAN CSW received consult regarding pt admitted from facility. CSW spoke with pt's daughter regarding pt's living status and discharge disposition. Per pt's daughter, pt has lived at [facility] for three years and family would prefer pt to return if [facility] SNF can offer hospice services to pt. CSW offered support to pt's daughter.    CSW contacted admissions liaison for [facility]. Per admissions liaison, pt is able to return with hospice services following. CSW has faxed clinical information to [facility] and will continue to follow and assist with discharge needs.   Assessment/plan status:  Psychosocial Support/Ongoing Assessment of Needs Other assessment/ plan:   none.   Information/referral to community resources:   Pt returing to [place] with hospice following.    PATIENT'S/FAMILY'S RESPONSE TO PLAN OF CARE: Pt's daughter understanding and agreeable to CSW plan of care. Pt's daughter expressed concern for pt's care with returning to [facility]. CSW offered emotional support. Pt's daughter  stated pt is familiar with [facility] SNF and would be "most comfortable" there. Pt's daughter expressed no other concerns/questions at this time.       Lubertha Sayres, MSW, [facility] Licensed Clinical Social Worker [phone] and [phone]

## 2014-01-08 NOTE — Evaluation (Signed)
Speech Language Pathology Evaluation Patient Details Name: Rita Miller MRN: [id number] DOB: 08-Feb-1930 Today's Date: 01/08/2014 Time: 4196-2229 SLP Time Calculation (min): 19 min  Problem List:  Patient Active Problem List   Diagnosis Date Noted  . CVA (cerebral infarction) 01/07/2014  . PAD (peripheral artery disease)   . Pacemaker   . COPD (chronic obstructive pulmonary disease)   . Hyperlipemia   . Hypothyroidism   . History of pulmonary embolism   . History of breast cancer   . Abdominal distention 12/24/2013  . Dementia 11/19/2013  . PVD (peripheral vascular disease) 12/21/2012  . Atherosclerosis of lower extremity with intermittent claudication 12/21/2012  . HTN (hypertension) 09/25/2012  . Hyperglycemia 04/02/2012  . Anticoagulant long-term use 12/28/2011  . UTI (urinary tract infection) 12/22/2011  . Critical lower limb ischaemia 06/03/2011  . Tachycardia-bradycardia syndrome 12/01/2010  . Gait instability 11/04/2010  . HYPERTRIGLYCERIDEMIA 07/23/2010  . Unspecified peripheral vascular disease 12/04/2009  . DEPRESSION 11/05/2009  . BREAST PAIN, LEFT 10/30/2009  . JAUNDICE 10/30/2009  . INSOMNIA, CHRONIC 08/31/2009  . PROLAPSE OF VAGINAL VAULT AFTER HYSTERECTOMY 08/31/2009  . MEMORY LOSS 08/05/2009  . AORTIC VALVE DISORDERS 06/24/2009  . MITRAL VALVE REPLACEMENT, HX OF 06/24/2009  . PACEMAKER-[employer] 06/24/2009  . COPD 06/08/2009  . EPISTAXIS, RECURRENT 06/08/2009  . TOBACCO USE, QUIT 06/08/2009  . HYPOTHYROIDISM 05/25/2009  . HYPERLIPIDEMIA 05/25/2009  . ANEMIA-IRON DEFICIENCY 05/25/2009  . CORONARY ARTERY DISEASE 05/25/2009  . Atrial fibrillation 05/25/2009  . ASTHMA 05/25/2009  . OSTEOPOROSIS 05/25/2009  . BREAST CANCER, HX OF 05/25/2009  . CERVICAL CANCER, HX OF 05/25/2009  . CEREBROVASCULAR ACCIDENT, HX OF 05/25/2009  . PULMONARY EMBOLISM, HX OF 05/25/2009  . DIVERTICULITIS, HX OF 05/25/2009   Past Medical History:  Past Medical History   Diagnosis Date  . CAD (coronary artery disease)   . History of CVA (cerebrovascular accident)   . Atrial fibrillation     peristent  . Hyperlipemia   . COPD (chronic obstructive pulmonary disease)   . Epistaxis     recurrent  . Osteoporosis   . Hypothyroidism   . History of pulmonary embolism   . History of colonic diverticulitis   . History of breast cancer   . Asthma   . Iron deficiency anemia   . History of transfusion of whole blood   . LBBB (left bundle branch block)     history of  . Aortal stenosis     severe  . Pacemaker     bradycardia s/p pacemaker implant  . PAD (peripheral artery disease)     left leg occluded viabahn stents x 3 left SFA  . Tachycardia-bradycardia syndrome     s/p PPM  . Cancer   . Arthritis   . Stroke   . HTN (hypertension) 09/25/2012  . HTN (hypertension) 09/25/2012   Past Surgical History:  Past Surgical History  Procedure Laterality Date  . Thyroidectomy      due to cancer  . Total hip arthroplasty    . Abdominal hysterectomy    . Mastectomy    . Mitral valve repair    . Intraocular lens insertion      left eye implant  . Pacemaker placement      for tachy/brady syndrome   HPI:  80 year old female with history of CAD, atrial fibrillation. She is currently a resident of an extended care facility. She was sent here for evaluation of weakness and concern of a CVA.  Dtr reports her mother lives at [facility]; facility sending  regular meals which she could not chew given ill-fitting dentures.     Assessment / Plan / Recommendation Clinical Impression  Pt has a moderate dysarthria characterized by imprecise articulation, which is improved with cues for a slowed rate and over articulation. Pt's also exhibits disorientation as well as decreased awareness of impairments and recall of daily events, which pt's daughter reports is an exacerbation of baseline impairments. Pt will benefit from skilled SLP services to maximize functional communication  and cognition.    SLP Assessment  Patient needs continued Speech Lanaguage Pathology Services    Follow Up Recommendations  Skilled Nursing facility    Frequency and Duration min 1 x/week  1 week   Pertinent Vitals/Pain N/A   SLP Goals  SLP Goals Potential to Achieve Goals: Good Potential Considerations: Ability to learn/carryover information;Previous level of function  SLP Evaluation Prior Functioning  Cognitive/Linguistic Baseline: Baseline deficits Baseline deficit details: daughter reports baseline deficits with memory secondary to dementia, but that these impairments have been exacerbated Type of Home: [place] Available Help at Discharge: [place]  Overall Cognitive Status: Impaired/Different from baseline Arousal/Alertness: Awake/alert Orientation Level: Oriented to person;Disoriented to place;Disoriented to time;Disoriented to situation Attention: Sustained Sustained Attention: Appears intact Memory: Impaired Memory Impairment: Storage deficit;Retrieval deficit;Decreased recall of new information Awareness: Impaired Awareness Impairment: Intellectual impairment;Emergent impairment;Anticipatory impairment Safety/Judgment: Impaired    Comprehension  Auditory Comprehension Overall Auditory Comprehension: Appears within functional limits for tasks assessed (for basic tasks) Visual Recognition/Discrimination Discrimination: Not tested Reading Comprehension Reading Status: Not tested    Expression Expression Primary Mode of Expression: Verbal Verbal Expression Overall Verbal Expression: Appears within functional limits for tasks assessed Written Expression Written Expression: Not tested   Oral / Motor Oral Motor/Sensory Function Overall Oral Motor/Sensory Function:  (mildly decreased CN VII, lower face) Motor Speech Overall Motor Speech: Impaired Respiration: Within functional limits Phonation: Normal Resonance:  Within functional limits Articulation: Impaired Level of Impairment: Phrase Intelligibility: Intelligibility reduced Phrase: 50-74% accurate Sentence: 50-74% accurate Conversation: 50-74% accurate Motor Planning: Witnin functional limits Motor Speech Errors: Not applicable Interfering Components: Inadequate dentition Effective Techniques: Slow rate;Over-articulate   GO      Germain Osgood, M.A. CCC-SLP [phone]  Germain Osgood 01/08/2014, 12:16 PM

## 2014-01-08 NOTE — Progress Notes (Signed)
[facility] for Eliquis Indication: new CVA with  Afib  Allergies  Allergen Reactions  . Donepezil Hydrochloride     REACTION: heart fluttering  . Morphine Sulfate   . Oxycodone-Acetaminophen     REACTION: Hallucinations  . Percocet [Oxycodone-Acetaminophen]     Patient Measurements: Height: 5' 4.17" (163 cm) Weight: 98 lb 1.7 oz (44.5 kg) IBW/kg (Calculated) : 55.1   Vital Signs: Temp: 97.5 F (36.4 C) (06/03 0928) Temp src: Oral (06/03 0928) BP: 120/62 mmHg (06/03 0928) Pulse Rate: 86 (06/03 0928)  Labs:  Recent Labs  01/06/14 2133 01/06/14 2145 01/07/14 1218 01/07/14 1630 01/07/14 1907 01/08/14 0500  HGB 11.0* 12.6 11.5* 11.7*  --  11.9*  HCT 34.2* 37.0 35.3* 35.1*  --  36.9  PLT 242  --  235 231  --  232  APTT 41*  --   --   --   --   --   LABPROT 18.3*  --   --   --  18.4* 20.0*  INR 1.57*  --   --   --  1.58* 1.76*  CREATININE 1.15* 1.30* 0.87 0.88  --   --     Estimated Creatinine Clearance: 35.2 ml/min (by C-G formula based on Cr of 0.88).   Medical History: Past Medical History  Diagnosis Date  . CAD (coronary artery disease)   . History of CVA (cerebrovascular accident)   . Atrial fibrillation     peristent  . Hyperlipemia   . COPD (chronic obstructive pulmonary disease)   . Epistaxis     recurrent  . Osteoporosis   . Hypothyroidism   . History of pulmonary embolism   . History of colonic diverticulitis   . History of breast cancer   . Asthma   . Iron deficiency anemia   . History of transfusion of whole blood   . LBBB (left bundle branch block)     history of  . Aortal stenosis     severe  . Pacemaker     bradycardia s/p pacemaker implant  . PAD (peripheral artery disease)     left leg occluded viabahn stents x 3 left SFA  . Tachycardia-bradycardia syndrome     s/p PPM  . Cancer   . Arthritis   . Stroke   . HTN (hypertension) 09/25/2012  . HTN (hypertension) 09/25/2012    Medications:   Prescriptions prior to admission  Medication Sig Dispense Refill  . acetaminophen (TYLENOL) 500 MG tablet Take 1,000 mg by mouth 2 (two) times daily as needed (pain).      Marland Kitchen albuterol (PROVENTIL HFA;VENTOLIN HFA) 108 (90 BASE) MCG/ACT inhaler Inhale 2 puffs into the lungs every 6 (six) hours as needed for wheezing or shortness of breath.  1 Inhaler  3  . cephALEXin (KEFLEX) 250 MG capsule Take 1 capsule (250 mg total) by mouth 4 (four) times daily.  28 capsule  0  . diltiazem (CARDIZEM CD) 240 MG 24 hr capsule Take 240 mg by mouth daily.       . fluticasone (FLOVENT HFA) 44 MCG/ACT inhaler Inhale 2 puffs into the lungs daily.       Marland Kitchen guaifenesin (ROBITUSSIN) 100 MG/5ML syrup Take 100 mg by mouth every 4 (four) hours as needed for cough.      . levothyroxine (SYNTHROID, LEVOTHROID) 75 MCG tablet Take 1 tablet (75 mcg total) by mouth daily.  90 tablet  3  . Melatonin 3 MG TABS Take 3 mg by  mouth at bedtime.       . memantine (NAMENDA) 10 MG tablet Take 10 mg by mouth 2 (two) times daily.       . metoCLOPramide (REGLAN) 10 MG tablet Take 1 tablet (10 mg total) by mouth every 6 (six) hours.  30 tablet  0  . omeprazole (PRILOSEC) 20 MG capsule Take 20 mg by mouth daily. Take on and empty stomach- Do no crush.      . pantoprazole (PROTONIX) 40 MG tablet Take 1 tablet (40 mg total) by mouth daily.  30 tablet  0  . polyethylene glycol (MIRALAX / GLYCOLAX) packet Take 17 g by mouth daily. Mix 17gm in 6-8 ounces of liquid daily.       . pravastatin (PRAVACHOL) 20 MG tablet Take 1 tablet (20 mg total) by mouth daily.  30 tablet  3  . Rivastigmine (EXELON) 13.3 MG/24HR PT24 Place 1 patch onto the skin daily.      Marland Kitchen SALINE NASAL SPRAY NA Place 1 spray into both nostrils 2 (two) times daily. 0.65% saline nasal spray      . tiotropium (SPIRIVA) 18 MCG inhalation capsule Place 18 mcg into inhaler and inhale daily.        . Vaginal Lubricant (REPLENS) GEL Place 1 application vaginally daily as needed (vaginal  dryness).      . vitamin E 400 UNIT capsule Take 400 Units by mouth daily.        . Vitamins A & D (VITAMIN A & D) ointment Apply 1 application topically as needed for dry skin.      Marland Kitchen warfarin (COUMADIN) 2 MG tablet Take 2 mg by mouth 2 (two) times a week. 2 mg on Wed and Sat 3 mg Mon, Tues, Thurs, Fri and Sun      . warfarin (COUMADIN) 3 MG tablet Take 3 mg by mouth daily. 2 mg on Wed and Sat 3 mg Mon, Tues, Thurs, Fri and Sun       Scheduled:  . ampicillin-sulbactam (UNASYN) IV  1.5 g Intravenous Q8H  . apixaban  2.5 mg Oral BID  . cefTRIAXone (ROCEPHIN)  IV  1 g Intravenous Q24H  . Chlorhexidine Gluconate Cloth  6 each Topical Q0600  . diltiazem  240 mg Oral Daily  . fluticasone  2 puff Inhalation Daily  . levothyroxine  75 mcg Oral QAC breakfast  . memantine  10 mg Oral BID  . metoCLOPramide  10 mg Oral Q6H  . mupirocin ointment   Nasal BID  . pantoprazole  40 mg Oral Daily  . polyethylene glycol  17 g Oral Daily  . Rivastigmine  13.3 mg Transdermal Daily  . senna-docusate  1 tablet Oral QHS  . simvastatin  10 mg Oral q1800  . tiotropium  18 mcg Inhalation Daily   Infusions:  . sodium chloride 50 mL/hr at 01/07/14 1817    Assessment: 80 y.o female admitted with acute right recurrent CVA and PMH  on chronic coumadin for PE, PAF, CVA and PAD with stents in her lower extremitites who was brought back to ED today 01/07/14 from her SNF due to decreased responsiveness, left sided weakness and difficulty with her secretions. Patient was also seen at [facility] on Friday 5/29 and at [facility] ED on 6/1 with similar symptoms. Admitted for  new CVA and subtherapeutic INR on chronic coumadin for h/o PE, Afib, PAD and CVA.  She lost IV access and is changing to eliquis.  INR today 1.58  Will receive reduced dose of eliquis as age 26 and 44.5 kg  Goal of Therapy:  Appropriate dose of anticoagulant Monitor platelets by anticoagulation protocol: Yes   Plan:  Eliquis 2.5 mg po bid   Excell Seltzer, PharmD Clinical Pharmacist Pager: [phone] 01/08/2014,12:25 PM

## 2014-01-08 NOTE — Progress Notes (Signed)
Patient Demographics  Rita Miller, is a 80 y.o. female, DOB - 06/17/1930, VFI:[id number]  Admit date - 01/07/2014   Admitting Physician Nita Sells, MD  Outpatient Primary MD for the patient is Penni Homans, MD  LOS - 1   Chief Complaint  Patient presents with  . Weakness           Subjective:   Rita Miller today has, No headache, No chest pain, No abdominal pain - No Nausea, No new weakness tingling or numbness, No Cough - SOB. Improving left-sided weakness. Somewhat unreliable historian at this time.    Assessment & Plan    Left sided extremity weakness  Unable to do MRI due to pacemaker, CT angino partially done due to leaked IV dye, however no CVA noted on limited CT, she was subtherapeutic on Coumadin and now per neurology recommendation has been switched to Eliquis, continue supportive care, have discussed with her daughter bedside she only wants gentle medical treatment focused towards keeping her comfortable. If there is any further decline she wants to focus on full comfort care. We'll withhold further stroke workup i.e. no echogram or carotid duplex as patient is not a candidate for aggressive treatment beyond what she is getting. And management we'll not change.    UTI  Cultures from 5/29 show both E-coli and Enterococcus.  On Unasyn per sensitivity    Aspirating  Speech Therapy following, high risk for aspiration, currently on Unasyn will suffice, definite infiltrate on chest x-ray. Aspiration precautions.    Hypothyroidism  Hx of thyroid cancer  On synthroid      COPD  Currently stable.  Place on nebulizers (I don't believe the patient can effectively use her inhalers).     Dementia  Continue namenda and exelon patch.     Hypertension    Continue diltiazem  Allow for permissive hypertension post stroke.  Will add prn hydralazine.    Afib  Continue Diltiazem for rate control  Switched to Eliquis after discussions with Dr. Leonie Man neurologist      Code Status: DO NOT RESUSCITATE  Family Communication: Daughter bedside  Disposition Plan: SNF   Procedures CT head, chest x-ray, abdominal x-ray   Consults Neuro, palliative care   Medications  Scheduled Meds: . ampicillin-sulbactam (UNASYN) IV  1.5 g Intravenous Q8H  . apixaban  2.5 mg Oral BID  . cefTRIAXone (ROCEPHIN)  IV  1 g Intravenous Q24H  . Chlorhexidine Gluconate Cloth  6 each Topical Q0600  . diltiazem  240 mg Oral Daily  . fluticasone  2 puff Inhalation Daily  . levothyroxine  75 mcg Oral QAC breakfast  . memantine  10 mg Oral BID  . metoCLOPramide  10 mg Oral Q6H  . mupirocin ointment   Nasal BID  . pantoprazole  40 mg Oral Daily  . polyethylene glycol  17 g Oral Daily  . Rivastigmine  13.3 mg Transdermal Daily  . senna-docusate  1 tablet Oral QHS  . simvastatin  10 mg Oral q1800  . tiotropium  18 mcg Inhalation Daily   Continuous Infusions: . sodium chloride 50 mL/hr at 01/07/14 1817   PRN Meds:.acetaminophen, hydrALAZINE, ipratropium-albuterol, sodium chloride, vitamin A & D  DVT Prophylaxis  Eliquis  Lab Results  Component Value  Date   PLT 232 01/08/2014    Antibiotics    Anti-infectives   Start     Dose/Rate Route Frequency Ordered Stop   01/07/14 1800  ampicillin (OMNIPEN) 1 g in sodium chloride 0.9 % 50 mL IVPB  Status:  Discontinued     1 g 150 mL/hr over 20 Minutes Intravenous 4 times per day 01/07/14 1520 01/07/14 1525   01/07/14 1700  ampicillin-sulbactam (UNASYN) 1.5 g in sodium chloride 0.9 % 50 mL IVPB     1.5 g 100 mL/hr over 30 Minutes Intravenous Every 8 hours 01/07/14 1542     01/07/14 1600  cefTRIAXone (ROCEPHIN) 1 g in dextrose 5 % 50 mL IVPB     1 g 100 mL/hr over 30 Minutes Intravenous Every 24 hours  01/07/14 1556            Objective:   Filed Vitals:   01/08/14 0350 01/08/14 0600 01/08/14 0859 01/08/14 0928  BP: 145/61 174/65  120/62  Pulse: 94 85  86  Temp: 98 F (36.7 C) 99 F (37.2 C)  97.5 F (36.4 C)  TempSrc: Oral Oral  Oral  Resp: 18 18  20   Height:      Weight:      SpO2: 97% 97% 97% 97%    Wt Readings from Last 3 Encounters:  01/07/14 44.5 kg (98 lb 1.7 oz)  01/03/14 44.453 kg (98 lb)  12/27/13 44.453 kg (98 lb)    No intake or output data in the 24 hours ending 01/08/14 1302   Physical Exam  Awake isn't a confused, mild left-sided weakness, Normal affect [name].AT,PERRAL Supple Neck,No JVD, No cervical lymphadenopathy appriciated.  Symmetrical Chest wall movement, Good air movement bilaterally, CTAB RRR,No Gallops,Rubs or new Murmurs, No Parasternal Heave +ve B.Sounds, Abd Soft, No tenderness, No organomegaly appriciated, No rebound - guarding or rigidity. No Cyanosis, Clubbing or edema, No new Rash or bruise      Data Review   Micro Results Recent Results (from the past 240 hour(s))  URINE CULTURE     Status: None   Collection Time    01/03/14  2:35 PM      Result Value Ref Range Status   Specimen Description URINE, CLEAN CATCH   Final   Special Requests NONE   Final   Culture  Setup Time     Final   Value: 01/04/2014 19:30     Performed at [facility] Count     Final   Value: >=100,000 COLONIES/ML     Performed at [facility]   Culture     Final   Value: ESCHERICHIA COLI     ENTEROCOCCUS SPECIES     Performed at [facility]   Report Status 01/07/2014 FINAL   Final   Organism ID, Bacteria ESCHERICHIA COLI   Final   Organism ID, Bacteria ENTEROCOCCUS SPECIES   Final  MRSA PCR SCREENING     Status: Abnormal   Collection Time    01/07/14  4:20 PM      Result Value Ref Range Status   MRSA by PCR POSITIVE (*) NEGATIVE Final   Comment:            The GeneXpert MRSA Assay (FDA     approved for NASAL  specimens     only), is one component of a     comprehensive MRSA colonization     surveillance program. It is not     intended to  diagnose MRSA     infection nor to guide or     monitor treatment for     MRSA infections.     RESULT CALLED TO, READ BACK BY AND VERIFIED WITH:     J.ALLEN,RN AT 1826 BY L.PITT 01/07/14.    Radiology Reports Ct Angio Head W/cm &/or Wo Cm  01/07/2014   CLINICAL DATA:  80 year old female with left side weakness, slurred speech, facial droop. Stroke symptoms. Initial encounter.  EXAM: CT HEAD WITHOUT CONTRAST  TECHNIQUE: Contiguous axial images were obtained from the base of the skull through the vertex without intravenous contrast.  COMPARISON:  Head CT without contrast 01/06/2014.  CONTRAST:  CTA head was originally attempted, but the patient's intravenous access extravasated, and no other suitable IV access could be identified. 50 cc of Omnipaque 350 was extravasated at the upper extremity IV site. The patient was examined by doctor Lajean Manes. No tense or palpable soft tissue lesion was identified at the extravasation site.  No CTA was performed at this time.  FINDINGS: Stable paranasal sinuses and mastoids. Stable visualized osseous structures. No acute orbit or scalp soft tissue findings.  Calcified atherosclerosis at the skull base.  Large area of right MCA chronic encephalomalacia appears stable. Stable ventricle size and configuration. Stable gray-white matter differentiation throughout the brain. No midline shift, mass effect, or evidence of intracranial mass lesion. Small chronic lacunar infarct in the left cerebellar hemisphere is stable. No evidence of cortically based acute infarction identified. No acute intracranial hemorrhage identified.  IMPRESSION: 1. Stable non contrast CT appearance of the brain since 01/06/2014. Chronic large right MCA infarct and small chronic left cerebellar lacunar infarct. 2. CTA attempt was aborted following extravasation of IV  contrast, and no additional IV access could be obtained.   Electronically Signed   By: Lars Pinks M.D.   On: 01/07/2014 21:07   Dg Chest 2 View  01/07/2014   CLINICAL DATA:  CVA.  COPD.  EXAM: CHEST  2 VIEW  COMPARISON:  01/07/2014.  FINDINGS: The pacer wires are stable. The heart is enlarged but unchanged. There is tortuosity, ectasia and calcification of the thoracic aorta. There are chronic lung changes and emphysema Stable scarring changes chest below the right-sided pacemaker. No definite acute infiltrates and no edema or effusions. The bony thorax is stable.  IMPRESSION: Emphysema and pulmonary scarring without definite acute overlying pulmonary process.   Electronically Signed   By: Kalman Jewels M.D.   On: 01/07/2014 19:52      Ct Head (brain) Wo Contrast  01/06/2014   CLINICAL DATA:  Stroke-like symptoms left-sided weakness and slurred speech with facial droop.  EXAM: CT HEAD WITHOUT CONTRAST  TECHNIQUE: Contiguous axial images were obtained from the base of the skull through the vertex without intravenous contrast.  COMPARISON:  11/07/2009.  FINDINGS: Skull and Sinuses:No fracture destructive process. Gas posterior to the lower right maxillary sinus is likely within the oral cavity. There is circumferential mucosal thickening in the left maxillary sinus, with secretion retention.  Orbits: Bilateral cataract resection.  Brain: Extensive encephalomalacia and gliosis involving the right MCA territory, there is mild sparing at the [facility] border zones. There is no evidence of peri-infarct ischemia. Remote lacunar noted in the right caudate nucleus. There is a small vessel infarct in the upper left cerebellum, appearance and history compatible with remote injury. Ischemic gliosis which is confluent around the lateral ventricles. No evidence of hemorrhage, hydrocephalus, or mass lesion.  IMPRESSION: 1. No intracranial hemorrhage or  acute infarct identified. 2. Remote right MCA territory infarct. 3. Left  maxillary sinusitis.   Electronically Signed   By: Jorje Guild M.D.   On: 01/06/2014 22:50   Dg Chest Portable 1 View  01/07/2014   CLINICAL DATA:  Weakness  EXAM: PORTABLE CHEST - 1 VIEW  COMPARISON:  01/03/2014  FINDINGS: Cardiac shadow is stable. A pacing device is again seen. Some persistent changes in the right upper lobe are noted. No new focal abnormality is seen. The lungs remain hyperinflated.  IMPRESSION: Persistent changes in the right upper lobe. No acute abnormality is seen.   Electronically Signed   By: Inez Catalina M.D.   On: 01/07/2014 12:59   Dg Abd 2 Views  01/03/2014   CLINICAL DATA:  Abdominal pain.  Nausea and vomiting.  EXAM: ABDOMEN - 2 VIEW  COMPARISON:  12/19/2013  FINDINGS: No evidence dilated bowel loops. Moderate to large stool burden noted throughout the majority colon. Diffuse atherosclerotic calcification of abdominal aorta and iliac arteries noted. No evidence free air.  IMPRESSION: Nonobstructive bowel gas pattern. Moderate to large stool burden noted.   Electronically Signed   By: Earle Gell M.D.   On: 01/03/2014 13:07   Dg Abd 2 Views  12/19/2013   CLINICAL DATA:  Constipation, abdominal distention  EXAM: ABDOMEN - 2 VIEW  COMPARISON:  None.  FINDINGS: Nonobstructive bowel gas pattern.  Mild to moderate colonic stool burden.  No evidence of free air under the diaphragm on the upright view.  Status post ORIF of the left hip.  Right zero hip arthroplasty.  Degenerative changes of the lumbar spine.  Suspected sclerotic lesion/callus formation along the left posterior 7th rib on the upright abdominal radiograph. This appearance may reflect a healing fracture; in the absence of appropriate traumatic history, consider CT chest for further evaluation.  IMPRESSION: Nonobstructive bowel gas pattern.  Mild to moderate colonic stool burden, possibly within normal limits.  Sclerotic lesion involving the left posterior 7th rib, possibly reflecting a healing fracture. In the absence  of appropriate traumatic history, consider CT chest for further evaluation.   Electronically Signed   By: Julian Hy M.D.   On: 12/19/2013 14:11    CBC  Recent Labs Lab 01/03/14 1320 01/06/14 2133 01/06/14 2145 01/07/14 1218 01/07/14 1630 01/08/14 0500  WBC 5.3 5.7  --  5.3 5.0 7.4  HGB 10.9* 11.0* 12.6 11.5* 11.7* 11.9*  HCT 33.5* 34.2* 37.0 35.3* 35.1* 36.9  PLT 264 242  --  235 231 232  MCV 98.2 96.3  --  95.9 95.6 96.6  MCH 32.0 31.0  --  31.3 31.9 31.2  MCHC 32.5 32.2  --  32.6 33.3 32.2  RDW 17.4* 17.4*  --  17.3* 17.4* 17.4*  LYMPHSABS 1.2 1.4  --  1.0  --   --   MONOABS 0.3 0.4  --  0.4  --   --   EOSABS 0.6 0.5  --  0.4  --   --   BASOSABS 0.1 0.1  --  0.0  --   --     Chemistries   Recent Labs Lab 01/03/14 1320 01/06/14 2133 01/06/14 2145 01/07/14 1218 01/07/14 1630  NA 142 141 144 138  --   K 4.1 3.8 3.7 3.6*  --   CL 106 105 102 104  --   CO2 24 26  --  21  --   GLUCOSE 96 122* 123* 101*  --   BUN 23 25* 25* 17  --  CREATININE 1.10 1.15* 1.30* 0.87 0.88  CALCIUM 9.2 9.2  --  8.9  --   AST 23 22  --  26  --   ALT 11 10  --  9  --   ALKPHOS 70 74  --  75  --   BILITOT 0.7 0.6  --  0.7  --    ------------------------------------------------------------------------------------------------------------------ estimated creatinine clearance is 35.2 ml/min (by C-G formula based on Cr of 0.88). ------------------------------------------------------------------------------------------------------------------ No results found for this basename: HGBA1C,  in the last 72 hours ------------------------------------------------------------------------------------------------------------------  Recent Labs  01/08/14 0448  CHOL 186  HDL 73  LDLCALC 102*  TRIG 55  CHOLHDL 2.5   ------------------------------------------------------------------------------------------------------------------ No results found for this basename: TSH, T4TOTAL, FREET3,  T3FREE, THYROIDAB,  in the last 72 hours ------------------------------------------------------------------------------------------------------------------ No results found for this basename: VITAMINB12, FOLATE, FERRITIN, TIBC, IRON, RETICCTPCT,  in the last 72 hours  Coagulation profile  Recent Labs Lab 01/06/14 2133 01/07/14 1907 01/08/14 0500  INR 1.57* 1.58* 1.76*    No results found for this basename: DDIMER,  in the last 72 hours  Cardiac Enzymes  Recent Labs Lab 01/03/14 1320  TROPONINI <0.30   ------------------------------------------------------------------------------------------------------------------ No components found with this basename: POCBNP,      Time Spent in minutes   35   Thurnell Lose M.D on 01/08/2014 at 1:02 PM  Between 7am to 7pm - Pager - [phone]  After 7pm go to [email or web address] - password [email or web address]  And look for the night coverage person covering for me after hours  [facility] Office  [phone]   **Disclaimer: This note may have been dictated with voice recognition software. Similar sounding words can inadvertently be transcribed and this note may contain transcription errors which may not have been corrected upon publication of note.**

## 2014-01-08 NOTE — Evaluation (Signed)
Physical Therapy Evaluation Patient Details Name: Rita Miller MRN: [id number] DOB: October 25, 1929 Today's Date: 01/08/2014   History of Present Illness  pt admitted through ED today (6/2) still with decreased responsiveness, left sided weakness and difficulty with her secretions.   Clinical Impression  Pt admitted with/for decreased responsiveness, L sided weakness and difficulty managing secretions.  Pt currently limited functionally due to the problems listed. ( See problems list.)   Pt will benefit from PT to maximize function and safety in order to get ready for next venue listed below.     Follow Up Recommendations SNF    Equipment Recommendations  Other (comment) (TBA at SNF)    Recommendations for Other Services       Precautions / Restrictions Precautions Precautions: Fall      Mobility  Bed Mobility Overal bed mobility: Needs Assistance Bed Mobility: Rolling;Supine to Sit Rolling: Mod assist   Supine to sit: Mod assist     General bed mobility comments: cues for sequencing and truncal assist to EOB  Transfers Overall transfer level: Needs assistance Equipment used: None Transfers: Squat Pivot Transfers     Squat pivot transfers: Mod assist (times 2)     General transfer comment: cues for sequencing; assist to help forward and support for transfer due to pain and weakness.  Ambulation/Gait                Stairs            Wheelchair Mobility    Modified Rankin (Stroke Patients Only) Modified Rankin (Stroke Patients Only) Pre-Morbid Rankin Score: Moderately severe disability Modified Rankin: Moderately severe disability     Balance Overall balance assessment: Needs assistance Sitting-balance support: No upper extremity supported;Feet supported Sitting balance-Leahy Scale: Fair                                       Pertinent Vitals/Pain     Home Living Family/patient expects to be discharged to:: Skilled nursing  facility   Available Help at Discharge: [place] Type of Home: [place]                Prior Function Level of Independence: Needs assistance               Hand Dominance        Extremity/Trunk Assessment   Upper Extremity Assessment:  (6/3 decreased coordination at L wrist and hand)           Lower Extremity Assessment: Generalized weakness;RLE deficits/detail;LLE deficits/detail RLE Deficits / Details: grossly 3+/5 and painful at knee LLE Deficits / Details: grossly 3+/5 and painful at ankle and foot.     Communication   Communication: Expressive difficulties  Cognition Arousal/Alertness: Awake/alert Behavior During Therapy: WFL for tasks assessed/performed Overall Cognitive Status: Impaired/Different from baseline                      General Comments      Exercises        Assessment/Plan    PT Assessment Patient needs continued PT services  PT Diagnosis Abnormality of gait;Generalized weakness;Acute pain   PT Problem List Decreased strength;Decreased activity tolerance;Decreased balance;Decreased mobility;Decreased knowledge of use of DME;Decreased knowledge of precautions;Pain  PT Treatment Interventions DME instruction;Gait training;Functional mobility training;Therapeutic activities;Balance training;Patient/family education;Neuromuscular re-education   PT Goals (Current goals can be found in the Care Plan section) Acute  Rehab PT Goals Patient Stated Goal: pt did not participate with goal setting PT Goal Formulation: Patient unable to participate in goal setting Time For Goal Achievement: 01/22/14 Potential to Achieve Goals: Fair    Frequency Min 2X/week   Barriers to discharge        Co-evaluation               End of Session   Activity Tolerance: Patient tolerated treatment well Patient left: in bed;with call bell/phone within reach Nurse Communication: Mobility status          Time: 1410-1435 PT Time Calculation (min): 25 min   Charges:   PT Evaluation $Initial PT Evaluation Tier I: 1 Procedure PT Treatments $Therapeutic Activity: 8-22 mins   PT G CodesChrissie Noa Adabelle Griffiths V 01/08/2014, 3:00 PM 01/08/2014  Donnella Sham, PT [phone]  (pager)

## 2014-01-08 NOTE — OR Nursing (Signed)
Pt mistakenly pulled out her IV access. I had two unsuccessful attempts to insert an IV. Dr Leonel Ramsay notified. Call placed for IV nurse to insert a new IV, so heparin drip can be restarted. Will continue to monitor.

## 2014-01-09 ENCOUNTER — Ambulatory Visit: Payer: Medicare Other | Admitting: Family Medicine

## 2014-01-09 ENCOUNTER — Telehealth: Payer: Self-pay

## 2014-01-09 DIAGNOSIS — E43 Unspecified severe protein-calorie malnutrition: Secondary | ICD-10-CM | POA: Insufficient documentation

## 2014-01-09 MED ORDER — APIXABAN 2.5 MG PO TABS: 2.5000 mg | ORAL_TABLET | Freq: Two times a day (BID) | ORAL | Status: DC

## 2014-01-09 MED ORDER — ENSURE PUDDING PO PUDG: 1.0000 | ORAL | Status: DC

## 2014-01-09 NOTE — Progress Notes (Signed)
Patient Demographics  Rita Miller, is a 80 y.o. female, DOB - December 01, 1929, [id number]  Admit date - 01/07/2014   Admitting Physician Nita Sells, MD  Outpatient Primary MD for the patient is Rita Homans, MD  LOS - 2   Chief Complaint  Patient presents with  . Weakness           Subjective:   Rita Miller today has, No headache, No chest pain, No abdominal pain - No Nausea, No new weakness tingling or numbness, No Cough - SOB. Improving left-sided weakness. Somewhat unreliable historian at this time.    Assessment & Plan    Left sided extremity weakness  Unable to do MRI due to pacemaker, CT angino partially done due to leaked IV dye, however no CVA noted on limited CT, she was subtherapeutic on Coumadin and now per neurology recommendation has been switched to Eliquis, continue supportive care, have discussed with her daughter bedside she only wants gentle medical treatment focused towards keeping her comfortable. If there is any further decline she wants to focus on full comfort care. We'll withhold further stroke workup i.e. no echogram or carotid duplex as patient is not a candidate for aggressive treatment beyond what she is getting. And management will not change.    UTI  Cultures from 5/29 show both E-coli and Enterococcus.  On Unasyn per sensitivity    Aspirating  Speech Therapy following, high risk for aspiration, currently on Unasyn will suffice, definite infiltrate on chest x-ray. Aspiration precautions.    Hypothyroidism  Hx of thyroid cancer  On synthroid      COPD  Currently stable.  Place on nebulizers (I don't believe the patient can effectively use her inhalers).     Dementia  Continue namenda and exelon patch.     Hypertension    Continue diltiazem  Allow for permissive hypertension post stroke.  Will add prn hydralazine.    Afib  Continue Diltiazem for rate control  Switched to Eliquis after discussions with Dr. Leonie Man neurologist      Code Status: DO NOT RESUSCITATE  Family Communication: Daughter bedside  Disposition Plan: SNF   Procedures CT head, chest x-ray, abdominal x-ray   Consults Neuro, palliative care   Medications  Scheduled Meds: . ampicillin-sulbactam (UNASYN) IV  1.5 g Intravenous Q8H  . apixaban  2.5 mg Oral BID  . cefTRIAXone (ROCEPHIN)  IV  1 g Intravenous Q24H  . Chlorhexidine Gluconate Cloth  6 each Topical Q0600  . diltiazem  240 mg Oral Daily  . feeding supplement (ENSURE COMPLETE)  237 mL Oral BID BM  . feeding supplement (ENSURE)  1 Container Oral Q24H  . fluticasone  2 puff Inhalation Daily  . levothyroxine  75 mcg Oral QAC breakfast  . memantine  10 mg Oral BID  . metoCLOPramide  10 mg Oral Q6H  . mupirocin ointment   Nasal BID  . pantoprazole  40 mg Oral Daily  . polyethylene glycol  17 g Oral Daily  . Rivastigmine  13.3 mg Transdermal Daily  . senna-docusate  1 tablet Oral QHS  . simvastatin  10 mg Oral q1800  . tiotropium  18 mcg Inhalation Daily   Continuous Infusions: . sodium chloride 50 mL/hr at 01/08/14 2102  PRN Meds:.acetaminophen, hydrALAZINE, ipratropium-albuterol, sodium chloride, vitamin A & D  DVT Prophylaxis  Eliquis  Lab Results  Component Value Date   PLT 232 01/08/2014    Antibiotics    Anti-infectives   Start     Dose/Rate Route Frequency Ordered Stop   01/07/14 1800  ampicillin (OMNIPEN) 1 g in sodium chloride 0.9 % 50 mL IVPB  Status:  Discontinued     1 g 150 mL/hr over 20 Minutes Intravenous 4 times per day 01/07/14 1520 01/07/14 1525   01/07/14 1700  ampicillin-sulbactam (UNASYN) 1.5 g in sodium chloride 0.9 % 50 mL IVPB     1.5 g 100 mL/hr over 30 Minutes Intravenous Every 8 hours 01/07/14 1542     01/07/14 1600   cefTRIAXone (ROCEPHIN) 1 g in dextrose 5 % 50 mL IVPB     1 g 100 mL/hr over 30 Minutes Intravenous Every 24 hours 01/07/14 1556            Objective:   Filed Vitals:   01/08/14 2129 01/09/14 0030 01/09/14 0452 01/09/14 1031  BP: 119/68 133/84 143/79 152/62  Pulse: 73 81 78 80  Temp: 98.4 F (36.9 C) 98 F (36.7 C) 98.3 F (36.8 C) 97.9 F (36.6 C)  TempSrc: Oral Oral Oral Oral  Resp: 16 16 18 20   Height:      Weight:      SpO2: 96% 95% 94% 99%    Wt Readings from Last 3 Encounters:  01/07/14 44.5 kg (98 lb 1.7 oz)  01/03/14 44.453 kg (98 lb)  12/27/13 44.453 kg (98 lb)     Intake/Output Summary (Last 24 hours) at 01/09/14 1046 Last data filed at 01/08/14 2102  Gross per 24 hour  Intake    120 ml  Output      0 ml  Net    120 ml     Physical Exam  Awake, mildly confused, mild left-sided weakness, Normal affect [place].AT,PERRAL Supple Neck,No JVD, No cervical lymphadenopathy appriciated.  Symmetrical Chest wall movement, Good air movement bilaterally, CTAB RRR,No Gallops,Rubs or new Murmurs, No Parasternal Heave +ve B.Sounds, Abd Soft, No tenderness, No organomegaly appriciated, No rebound - guarding or rigidity. No Cyanosis, Clubbing or edema, No new Rash or bruise      Data Review   Micro Results Recent Results (from the past 240 hour(s))  URINE CULTURE     Status: None   Collection Time    01/03/14  2:35 PM      Result Value Ref Range Status   Specimen Description URINE, CLEAN CATCH   Final   Special Requests NONE   Final   Culture  Setup Time     Final   Value: 01/04/2014 19:30     Performed at [facility] Count     Final   Value: >=100,000 COLONIES/ML     Performed at [facility]   Culture     Final   Value: ESCHERICHIA COLI     ENTEROCOCCUS SPECIES     Performed at [facility]   Report Status 01/07/2014 FINAL   Final   Organism ID, Bacteria ESCHERICHIA COLI   Final   Organism ID, Bacteria ENTEROCOCCUS  SPECIES   Final  MRSA PCR SCREENING     Status: Abnormal   Collection Time    01/07/14  4:20 PM      Result Value Ref Range Status   MRSA by PCR POSITIVE (*) NEGATIVE Final   Comment:  The GeneXpert MRSA Assay (FDA     approved for NASAL specimens     only), is one component of a     comprehensive MRSA colonization     surveillance program. It is not     intended to diagnose MRSA     infection nor to guide or     monitor treatment for     MRSA infections.     RESULT CALLED TO, READ BACK BY AND VERIFIED WITH:     J.ALLEN,RN AT 1826 BY L.PITT 01/07/14.    Radiology Reports Ct Angio Head W/cm &/or Wo Cm  01/07/2014   CLINICAL DATA:  80 year old female with left side weakness, slurred speech, facial droop. Stroke symptoms. Initial encounter.  EXAM: CT HEAD WITHOUT CONTRAST  TECHNIQUE: Contiguous axial images were obtained from the base of the skull through the vertex without intravenous contrast.  COMPARISON:  Head CT without contrast 01/06/2014.  CONTRAST:  CTA head was originally attempted, but the patient's intravenous access extravasated, and no other suitable IV access could be identified. 50 cc of Omnipaque 350 was extravasated at the upper extremity IV site. The patient was examined by doctor Lajean Manes. No tense or palpable soft tissue lesion was identified at the extravasation site.  No CTA was performed at this time.  FINDINGS: Stable paranasal sinuses and mastoids. Stable visualized osseous structures. No acute orbit or scalp soft tissue findings.  Calcified atherosclerosis at the skull base.  Large area of right MCA chronic encephalomalacia appears stable. Stable ventricle size and configuration. Stable gray-white matter differentiation throughout the brain. No midline shift, mass effect, or evidence of intracranial mass lesion. Small chronic lacunar infarct in the left cerebellar hemisphere is stable. No evidence of cortically based acute infarction identified. No acute  intracranial hemorrhage identified.  IMPRESSION: 1. Stable non contrast CT appearance of the brain since 01/06/2014. Chronic large right MCA infarct and small chronic left cerebellar lacunar infarct. 2. CTA attempt was aborted following extravasation of IV contrast, and no additional IV access could be obtained.   Electronically Signed   By: Lars Pinks M.D.   On: 01/07/2014 21:07   Dg Chest 2 View  01/07/2014   CLINICAL DATA:  CVA.  COPD.  EXAM: CHEST  2 VIEW  COMPARISON:  01/07/2014.  FINDINGS: The pacer wires are stable. The heart is enlarged but unchanged. There is tortuosity, ectasia and calcification of the thoracic aorta. There are chronic lung changes and emphysema Stable scarring changes chest below the right-sided pacemaker. No definite acute infiltrates and no edema or effusions. The bony thorax is stable.  IMPRESSION: Emphysema and pulmonary scarring without definite acute overlying pulmonary process.   Electronically Signed   By: Kalman Jewels M.D.   On: 01/07/2014 19:52      Ct Head (brain) Wo Contrast  01/06/2014   CLINICAL DATA:  Stroke-like symptoms left-sided weakness and slurred speech with facial droop.  EXAM: CT HEAD WITHOUT CONTRAST  TECHNIQUE: Contiguous axial images were obtained from the base of the skull through the vertex without intravenous contrast.  COMPARISON:  11/07/2009.  FINDINGS: Skull and Sinuses:No fracture destructive process. Gas posterior to the lower right maxillary sinus is likely within the oral cavity. There is circumferential mucosal thickening in the left maxillary sinus, with secretion retention.  Orbits: Bilateral cataract resection.  Brain: Extensive encephalomalacia and gliosis involving the right MCA territory, there is mild sparing at the [facility] border zones. There is no evidence of peri-infarct ischemia. Remote lacunar noted in the  right caudate nucleus. There is a small vessel infarct in the upper left cerebellum, appearance and history compatible with remote  injury. Ischemic gliosis which is confluent around the lateral ventricles. No evidence of hemorrhage, hydrocephalus, or mass lesion.  IMPRESSION: 1. No intracranial hemorrhage or acute infarct identified. 2. Remote right MCA territory infarct. 3. Left maxillary sinusitis.   Electronically Signed   By: Jorje Guild M.D.   On: 01/06/2014 22:50   Dg Chest Portable 1 View  01/07/2014   CLINICAL DATA:  Weakness  EXAM: PORTABLE CHEST - 1 VIEW  COMPARISON:  01/03/2014  FINDINGS: Cardiac shadow is stable. A pacing device is again seen. Some persistent changes in the right upper lobe are noted. No new focal abnormality is seen. The lungs remain hyperinflated.  IMPRESSION: Persistent changes in the right upper lobe. No acute abnormality is seen.   Electronically Signed   By: Inez Catalina M.D.   On: 01/07/2014 12:59   Dg Abd 2 Views  01/03/2014   CLINICAL DATA:  Abdominal pain.  Nausea and vomiting.  EXAM: ABDOMEN - 2 VIEW  COMPARISON:  12/19/2013  FINDINGS: No evidence dilated bowel loops. Moderate to large stool burden noted throughout the majority colon. Diffuse atherosclerotic calcification of abdominal aorta and iliac arteries noted. No evidence free air.  IMPRESSION: Nonobstructive bowel gas pattern. Moderate to large stool burden noted.   Electronically Signed   By: Earle Gell M.D.   On: 01/03/2014 13:07   Dg Abd 2 Views  12/19/2013   CLINICAL DATA:  Constipation, abdominal distention  EXAM: ABDOMEN - 2 VIEW  COMPARISON:  None.  FINDINGS: Nonobstructive bowel gas pattern.  Mild to moderate colonic stool burden.  No evidence of free air under the diaphragm on the upright view.  Status post ORIF of the left hip.  Right zero hip arthroplasty.  Degenerative changes of the lumbar spine.  Suspected sclerotic lesion/callus formation along the left posterior 7th rib on the upright abdominal radiograph. This appearance may reflect a healing fracture; in the absence of appropriate traumatic history, consider CT chest  for further evaluation.  IMPRESSION: Nonobstructive bowel gas pattern.  Mild to moderate colonic stool burden, possibly within normal limits.  Sclerotic lesion involving the left posterior 7th rib, possibly reflecting a healing fracture. In the absence of appropriate traumatic history, consider CT chest for further evaluation.   Electronically Signed   By: Julian Hy M.D.   On: 12/19/2013 14:11    CBC  Recent Labs Lab 01/03/14 1320 01/06/14 2133 01/06/14 2145 01/07/14 1218 01/07/14 1630 01/08/14 0500  WBC 5.3 5.7  --  5.3 5.0 7.4  HGB 10.9* 11.0* 12.6 11.5* 11.7* 11.9*  HCT 33.5* 34.2* 37.0 35.3* 35.1* 36.9  PLT 264 242  --  235 231 232  MCV 98.2 96.3  --  95.9 95.6 96.6  MCH 32.0 31.0  --  31.3 31.9 31.2  MCHC 32.5 32.2  --  32.6 33.3 32.2  RDW 17.4* 17.4*  --  17.3* 17.4* 17.4*  LYMPHSABS 1.2 1.4  --  1.0  --   --   MONOABS 0.3 0.4  --  0.4  --   --   EOSABS 0.6 0.5  --  0.4  --   --   BASOSABS 0.1 0.1  --  0.0  --   --     Chemistries   Recent Labs Lab 01/03/14 1320 01/06/14 2133 01/06/14 2145 01/07/14 1218 01/07/14 1630  NA 142 141 144 138  --  K 4.1 3.8 3.7 3.6*  --   CL 106 105 102 104  --   CO2 24 26  --  21  --   GLUCOSE 96 122* 123* 101*  --   BUN 23 25* 25* 17  --   CREATININE 1.10 1.15* 1.30* 0.87 0.88  CALCIUM 9.2 9.2  --  8.9  --   AST 23 22  --  26  --   ALT 11 10  --  9  --   ALKPHOS 70 74  --  75  --   BILITOT 0.7 0.6  --  0.7  --    ------------------------------------------------------------------------------------------------------------------ estimated creatinine clearance is 35.2 ml/min (by C-G formula based on Cr of 0.88). ------------------------------------------------------------------------------------------------------------------  Recent Labs  01/08/14 0500  HGBA1C 5.9*   ------------------------------------------------------------------------------------------------------------------  Recent Labs  01/08/14 0448  CHOL  186  HDL 73  LDLCALC 102*  TRIG 55  CHOLHDL 2.5   ------------------------------------------------------------------------------------------------------------------ No results found for this basename: TSH, T4TOTAL, FREET3, T3FREE, THYROIDAB,  in the last 72 hours ------------------------------------------------------------------------------------------------------------------ No results found for this basename: VITAMINB12, FOLATE, FERRITIN, TIBC, IRON, RETICCTPCT,  in the last 72 hours  Coagulation profile  Recent Labs Lab 01/06/14 2133 01/07/14 1907 01/08/14 0500  INR 1.57* 1.58* 1.76*    No results found for this basename: DDIMER,  in the last 72 hours  Cardiac Enzymes  Recent Labs Lab 01/03/14 1320  TROPONINI <0.30   ------------------------------------------------------------------------------------------------------------------ No components found with this basename: POCBNP,      Time Spent in minutes   35   Thurnell Lose M.D on 01/09/2014 at 10:46 AM  Between 7am to 7pm - Pager - [phone]  After 7pm go to [email or web address] - password [email or web address]  And look for the night coverage person covering for me after hours  [facility] Office  [phone]   **Disclaimer: This note may have been dictated with voice recognition software. Similar sounding words can inadvertently be transcribed and this note may contain transcription errors which may not have been corrected upon publication of note.**

## 2014-01-09 NOTE — Telephone Encounter (Signed)
Per Lattie Haw at [facility] pt can't take prilosec and is going to just take protonix.   Lattie Haw is faxing pages [phone] for md to look at and sign

## 2014-01-09 NOTE — Telephone Encounter (Signed)
Order faxed.

## 2014-01-09 NOTE — Discharge Summary (Addendum)
Rita Miller, is a 80 y.o. female  DOB 1930-01-30  MRN [id number].  Admission date:  01/07/2014  Admitting Physician  Nita Sells, MD  Discharge Date:  01/09/2014   Primary MD  Penni Homans, MD  Recommendations for primary care physician for things to follow:   Follow second risk factors for stroke, full aspiration precautions. Continue PT and speech eval and treat.   Admission Diagnosis  CVA (cerebral infarction) [434.91]   Discharge Diagnosis  CVA (cerebral infarction) [434.91]     Principal Problem:   CVA (cerebral infarction) Active Problems:   CORONARY ARTERY DISEASE   Atrial fibrillation   CEREBROVASCULAR ACCIDENT, HX OF   MITRAL VALVE REPLACEMENT, HX OF   UTI (urinary tract infection)   Anticoagulant long-term use   Dementia   Pacemaker   COPD (chronic obstructive pulmonary disease)   Hypothyroidism   Palliative care encounter   Protein-calorie malnutrition, severe      Past Medical History  Diagnosis Date  . CAD (coronary artery disease)   . History of CVA (cerebrovascular accident)   . Atrial fibrillation     peristent  . Hyperlipemia   . COPD (chronic obstructive pulmonary disease)   . Epistaxis     recurrent  . Osteoporosis   . Hypothyroidism   . History of pulmonary embolism   . History of colonic diverticulitis   . History of breast cancer   . Asthma   . Iron deficiency anemia   . History of transfusion of whole blood   . LBBB (left bundle branch block)     history of  . Aortal stenosis     severe  . Pacemaker     bradycardia s/p pacemaker implant  . PAD (peripheral artery disease)     left leg occluded viabahn stents x 3 left SFA  . Tachycardia-bradycardia syndrome     s/p PPM  . Cancer   . Arthritis   . Stroke   . HTN (hypertension) 09/25/2012  . HTN (hypertension) 09/25/2012     Past Surgical History  Procedure Laterality Date  . Thyroidectomy      due to cancer  . Total hip arthroplasty    . Abdominal hysterectomy    . Mastectomy    . Mitral valve repair    . Intraocular lens insertion      left eye implant  . Pacemaker placement      for tachy/brady syndrome     Discharge Condition: stable   Follow UP  Follow-up Information   Follow up with Penni Homans, MD. Schedule an appointment as soon as possible for a visit in 1 week.   Specialty:  Family Medicine   Contact information:   [place] [address] [phone]       Follow up with [facility]. Schedule an appointment as soon as possible for a visit in 4 weeks.   Contact information:   [address]     [place] [address] [phone]  Discharge Instructions  and  Discharge Medications      Discharge Instructions   Discharge instructions    Complete by:  As directed   Follow with Primary MD Penni Homans, MD in 7 days   Get CBC, CMP, checked 7 days by Primary MD and again as instructed by your Primary MD.     Activity: As tolerated with Full fall precautions use walker/cane & assistance as needed   Disposition Home    Diet: Heart Healthy Dysphagia 1 with  feeding assistance and aspiration precautions   For Heart failure patients - Check your Weight same time everyday, if you gain over 2 pounds, or you develop in leg swelling, experience more shortness of breath or chest pain, call your Primary MD immediately. Follow Cardiac Low Salt Diet and 1.8 lit/day fluid restriction.   On your next visit with her primary care physician please Get Medicines reviewed and adjusted.  Please request your Prim.MD to go over all Hospital Tests and Procedure/Radiological results at the follow up, please get all Hospital records sent to your Prim MD by signing hospital release before you go home.   If you experience  worsening of your admission symptoms, develop shortness of breath, life threatening emergency, suicidal or homicidal thoughts you must seek medical attention immediately by calling 911 or calling your MD immediately  if symptoms less severe.  You Must read complete instructions/literature along with all the possible adverse reactions/side effects for all the Medicines you take and that have been prescribed to you. Take any new Medicines after you have completely understood and accpet all the possible adverse reactions/side effects.   Do not drive and provide baby sitting services if your were admitted for syncope or siezures until you have seen by Primary MD or a Neurologist and advised to do so again.  Do not drive when taking Pain medications.    Do not take more than prescribed Pain, Sleep and Anxiety Medications  Special Instructions: If you have smoked or chewed Tobacco  in the last 2 yrs please stop smoking, stop any regular Alcohol  and or any Recreational drug use.  Wear Seat belts while driving.   Please note  You were cared for by a hospitalist during your hospital stay. If you have any questions about your discharge medications or the care you received while you were in the hospital after you are discharged, you can call the unit and asked to speak with the hospitalist on call if the hospitalist that took care of you is not available. Once you are discharged, your primary care physician will handle any further medical issues. Please note that NO REFILLS for any discharge medications will be authorized once you are discharged, as it is imperative that you return to your primary care physician (or establish a relationship with a primary care physician if you do not have one) for your aftercare needs so that they can reassess your need for medications and monitor your lab values.  Follow with Primary MD Penni Homans, MD in 7 days   Get CBC, CMP, checked 7 days by Primary MD and again as  instructed by your Primary MD.     Activity: As tolerated with Full fall precautions use walker/cane & assistance as needed   Disposition Home    Diet: Heart Healthy Dysphagia 1 with  feeding assistance and aspiration precautions   For Heart failure patients - Check your Weight same time everyday, if you gain over 2 pounds, or you develop  in leg swelling, experience more shortness of breath or chest pain, call your Primary MD immediately. Follow Cardiac Low Salt Diet and 1.8 lit/day fluid restriction.   On your next visit with her primary care physician please Get Medicines reviewed and adjusted.  Please request your Prim.MD to go over all Hospital Tests and Procedure/Radiological results at the follow up, please get all Hospital records sent to your Prim MD by signing hospital release before you go home.   If you experience worsening of your admission symptoms, develop shortness of breath, life threatening emergency, suicidal or homicidal thoughts you must seek medical attention immediately by calling 911 or calling your MD immediately  if symptoms less severe.  You Must read complete instructions/literature along with all the possible adverse reactions/side effects for all the Medicines you take and that have been prescribed to you. Take any new Medicines after you have completely understood and accpet all the possible adverse reactions/side effects.   Do not drive and provide baby sitting services if your were admitted for syncope or siezures until you have seen by Primary MD or a Neurologist and advised to do so again.  Do not drive when taking Pain medications.    Do not take more than prescribed Pain, Sleep and Anxiety Medications  Special Instructions: If you have smoked or chewed Tobacco  in the last 2 yrs please stop smoking, stop any regular Alcohol  and or any Recreational drug use.  Wear Seat belts while driving.   Please note  You were cared for by a hospitalist  during your hospital stay. If you have any questions about your discharge medications or the care you received while you were in the hospital after you are discharged, you can call the unit and asked to speak with the hospitalist on call if the hospitalist that took care of you is not available. Once you are discharged, your primary care physician will handle any further medical issues. Please note that NO REFILLS for any discharge medications will be authorized once you are discharged, as it is imperative that you return to your primary care physician (or establish a relationship with a primary care physician if you do not have one) for your aftercare needs so that they can reassess your need for medications and monitor your lab values.     Increase activity slowly    Complete by:  As directed             Medication List    STOP taking these medications       cephALEXin 250 MG capsule  Commonly known as:  KEFLEX     warfarin 2 MG tablet  Commonly known as:  COUMADIN     warfarin 3 MG tablet  Commonly known as:  COUMADIN      TAKE these medications       acetaminophen 500 MG tablet  Commonly known as:  TYLENOL  Take 1,000 mg by mouth 2 (two) times daily as needed (pain).     albuterol 108 (90 BASE) MCG/ACT inhaler  Commonly known as:  PROVENTIL HFA;VENTOLIN HFA  Inhale 2 puffs into the lungs every 6 (six) hours as needed for wheezing or shortness of breath.     apixaban 2.5 MG Tabs tablet  Commonly known as:  ELIQUIS  Take 1 tablet (2.5 mg total) by mouth 2 (two) times daily.     diltiazem 240 MG 24 hr capsule  Commonly known as:  CARDIZEM CD  Take 240 mg by  mouth daily.     EXELON 13.3 MG/24HR Pt24  Generic drug:  Rivastigmine  Place 1 patch onto the skin daily.     feeding supplement (ENSURE) Pudg  Take 1 Container by mouth daily.     fluticasone 44 MCG/ACT inhaler  Commonly known as:  FLOVENT HFA  Inhale 2 puffs into the lungs daily.     guaifenesin 100 MG/5ML  syrup  Commonly known as:  ROBITUSSIN  Take 100 mg by mouth every 4 (four) hours as needed for cough.     levothyroxine 75 MCG tablet  Commonly known as:  SYNTHROID, LEVOTHROID  Take 1 tablet (75 mcg total) by mouth daily.     Melatonin 3 MG Tabs  Take 3 mg by mouth at bedtime.     memantine 10 MG tablet  Commonly known as:  NAMENDA  Take 10 mg by mouth 2 (two) times daily.     metoCLOPramide 10 MG tablet  Commonly known as:  REGLAN  Take 1 tablet (10 mg total) by mouth every 6 (six) hours.     omeprazole 20 MG capsule  Commonly known as:  PRILOSEC  Take 20 mg by mouth daily. Take on and empty stomach- Do no crush.     pantoprazole 40 MG tablet  Commonly known as:  PROTONIX  Take 1 tablet (40 mg total) by mouth daily.     polyethylene glycol packet  Commonly known as:  MIRALAX / GLYCOLAX  Take 17 g by mouth daily. Mix 17gm in 6-8 ounces of liquid daily.     pravastatin 20 MG tablet  Commonly known as:  PRAVACHOL  Take 1 tablet (20 mg total) by mouth daily.     REPLENS Gel  Place 1 application vaginally daily as needed (vaginal dryness).     SALINE NASAL SPRAY NA  Place 1 spray into both nostrils 2 (two) times daily. 0.65% saline nasal spray     tiotropium 18 MCG inhalation capsule  Commonly known as:  SPIRIVA  Place 18 mcg into inhaler and inhale daily.     vitamin A & D ointment  Apply 1 application topically as needed for dry skin.     vitamin E 400 UNIT capsule  Take 400 Units by mouth daily.          Diet and Activity recommendation: See Discharge Instructions above   Consults obtained -  Neuro , Pall care   Major procedures and Radiology Reports - PLEASE review detailed and final reports for all details, in brief -       Ct Angio Head W/cm &/or Wo Cm  01/07/2014   CLINICAL DATA:  80 year old female with left side weakness, slurred speech, facial droop. Stroke symptoms. Initial encounter.  EXAM: CT HEAD WITHOUT CONTRAST  TECHNIQUE: Contiguous  axial images were obtained from the base of the skull through the vertex without intravenous contrast.  COMPARISON:  Head CT without contrast 01/06/2014.  CONTRAST:  CTA head was originally attempted, but the patient's intravenous access extravasated, and no other suitable IV access could be identified. 50 cc of Omnipaque 350 was extravasated at the upper extremity IV site. The patient was examined by doctor Lajean Manes. No tense or palpable soft tissue lesion was identified at the extravasation site.  No CTA was performed at this time.  FINDINGS: Stable paranasal sinuses and mastoids. Stable visualized osseous structures. No acute orbit or scalp soft tissue findings.  Calcified atherosclerosis at the skull base.  Large area of right MCA chronic  encephalomalacia appears stable. Stable ventricle size and configuration. Stable gray-white matter differentiation throughout the brain. No midline shift, mass effect, or evidence of intracranial mass lesion. Small chronic lacunar infarct in the left cerebellar hemisphere is stable. No evidence of cortically based acute infarction identified. No acute intracranial hemorrhage identified.  IMPRESSION: 1. Stable non contrast CT appearance of the brain since 01/06/2014. Chronic large right MCA infarct and small chronic left cerebellar lacunar infarct. 2. CTA attempt was aborted following extravasation of IV contrast, and no additional IV access could be obtained.   Electronically Signed   By: Lars Pinks M.D.   On: 01/07/2014 21:07   Dg Chest 2 View  01/07/2014   CLINICAL DATA:  CVA.  COPD.  EXAM: CHEST  2 VIEW  COMPARISON:  01/07/2014.  FINDINGS: The pacer wires are stable. The heart is enlarged but unchanged. There is tortuosity, ectasia and calcification of the thoracic aorta. There are chronic lung changes and emphysema Stable scarring changes chest below the right-sided pacemaker. No definite acute infiltrates and no edema or effusions. The bony thorax is stable.   IMPRESSION: Emphysema and pulmonary scarring without definite acute overlying pulmonary process.   Electronically Signed   By: Kalman Jewels M.D.   On: 01/07/2014 19:52   Dg Chest 2 View  01/03/2014   CLINICAL DATA:  Nausea and vomiting  EXAM: CHEST  2 VIEW  COMPARISON:  12/03/2012  FINDINGS: Pacing device is again seen and stable. The cardiac shadow is mildly enlarged but stable. Scarring is noted within the lungs bilaterally. It is increased slightly in the lateral aspect of the right upper lobe somewhat obscured by the pacing pack. Hyperinflation is again seen. No focal infiltrate or sizable effusion is noted. No acute bony abnormality is seen.  IMPRESSION: Increased scarring bilaterally particularly in the right upper lobe.   Electronically Signed   By: Inez Catalina M.D.   On: 01/03/2014 13:12   Ct Head (brain) Wo Contrast  01/06/2014   CLINICAL DATA:  Stroke-like symptoms left-sided weakness and slurred speech with facial droop.  EXAM: CT HEAD WITHOUT CONTRAST  TECHNIQUE: Contiguous axial images were obtained from the base of the skull through the vertex without intravenous contrast.  COMPARISON:  11/07/2009.  FINDINGS: Skull and Sinuses:No fracture destructive process. Gas posterior to the lower right maxillary sinus is likely within the oral cavity. There is circumferential mucosal thickening in the left maxillary sinus, with secretion retention.  Orbits: Bilateral cataract resection.  Brain: Extensive encephalomalacia and gliosis involving the right MCA territory, there is mild sparing at the [facility] border zones. There is no evidence of peri-infarct ischemia. Remote lacunar noted in the right caudate nucleus. There is a small vessel infarct in the upper left cerebellum, appearance and history compatible with remote injury. Ischemic gliosis which is confluent around the lateral ventricles. No evidence of hemorrhage, hydrocephalus, or mass lesion.  IMPRESSION: 1. No intracranial hemorrhage or acute infarct  identified. 2. Remote right MCA territory infarct. 3. Left maxillary sinusitis.   Electronically Signed   By: Jorje Guild M.D.   On: 01/06/2014 22:50   Dg Chest Portable 1 View  01/07/2014   CLINICAL DATA:  Weakness  EXAM: PORTABLE CHEST - 1 VIEW  COMPARISON:  01/03/2014  FINDINGS: Cardiac shadow is stable. A pacing device is again seen. Some persistent changes in the right upper lobe are noted. No new focal abnormality is seen. The lungs remain hyperinflated.  IMPRESSION: Persistent changes in the right upper lobe. No acute abnormality  is seen.   Electronically Signed   By: Inez Catalina M.D.   On: 01/07/2014 12:59   Dg Abd 2 Views  01/03/2014   CLINICAL DATA:  Abdominal pain.  Nausea and vomiting.  EXAM: ABDOMEN - 2 VIEW  COMPARISON:  12/19/2013  FINDINGS: No evidence dilated bowel loops. Moderate to large stool burden noted throughout the majority colon. Diffuse atherosclerotic calcification of abdominal aorta and iliac arteries noted. No evidence free air.  IMPRESSION: Nonobstructive bowel gas pattern. Moderate to large stool burden noted.   Electronically Signed   By: Earle Gell M.D.   On: 01/03/2014 13:07   Dg Abd 2 Views  12/19/2013   CLINICAL DATA:  Constipation, abdominal distention  EXAM: ABDOMEN - 2 VIEW  COMPARISON:  None.  FINDINGS: Nonobstructive bowel gas pattern.  Mild to moderate colonic stool burden.  No evidence of free air under the diaphragm on the upright view.  Status post ORIF of the left hip.  Right zero hip arthroplasty.  Degenerative changes of the lumbar spine.  Suspected sclerotic lesion/callus formation along the left posterior 7th rib on the upright abdominal radiograph. This appearance may reflect a healing fracture; in the absence of appropriate traumatic history, consider CT chest for further evaluation.  IMPRESSION: Nonobstructive bowel gas pattern.  Mild to moderate colonic stool burden, possibly within normal limits.  Sclerotic lesion involving the left posterior 7th  rib, possibly reflecting a healing fracture. In the absence of appropriate traumatic history, consider CT chest for further evaluation.   Electronically Signed   By: Julian Hy M.D.   On: 12/19/2013 14:11    Micro Results      Recent Results (from the past 240 hour(s))  URINE CULTURE     Status: None   Collection Time    01/03/14  2:35 PM      Result Value Ref Range Status   Specimen Description URINE, CLEAN CATCH   Final   Special Requests NONE   Final   Culture  Setup Time     Final   Value: 01/04/2014 19:30     Performed at [facility] Count     Final   Value: >=100,000 COLONIES/ML     Performed at [facility]   Culture     Final   Value: ESCHERICHIA COLI     ENTEROCOCCUS SPECIES     Performed at [facility]   Report Status 01/07/2014 FINAL   Final   Organism ID, Bacteria ESCHERICHIA COLI   Final   Organism ID, Bacteria ENTEROCOCCUS SPECIES   Final  MRSA PCR SCREENING     Status: Abnormal   Collection Time    01/07/14  4:20 PM      Result Value Ref Range Status   MRSA by PCR POSITIVE (*) NEGATIVE Final   Comment:            The GeneXpert MRSA Assay (FDA     approved for NASAL specimens     only), is one component of a     comprehensive MRSA colonization     surveillance program. It is not     intended to diagnose MRSA     infection nor to guide or     monitor treatment for     MRSA infections.     RESULT CALLED TO, READ BACK BY AND VERIFIED WITH:     J.ALLEN,RN AT 1826 BY L.PITT 01/07/14.     History of present illness and  Hospital Course:     Kindly see H&P for history of present illness and admission details, please review complete Labs, Consult reports and Test reports for all details in brief Rita Miller, is a 80 y.o. female, patient with history of  CVA with residual slurring of speech and drooling, as well as PAD with stents in her lower extremities, ovarian cancer, thyroid cancer, and aortic stenosis was [place] for vomiting phlegm, decreased responsiveness and left sided weakness. According to her daughter, [name], she was diagnosed with a UTI and discharged back to her SNF. On Monday 6/1 she was sent to [facility] ED with similar symptoms but discharged home. Her daughter was uncertain why she was discharged. She returned to the ED   with decreased responsiveness, left sided weakness and difficulty with her secretions      Left sided extremity weakness  Unable to do MRI due to pacemaker, CT angino partially done due to leaked IV dye, however no CVA noted on limited CT, she was subtherapeutic on Coumadin and now per neurology recommendation has been switched to Eliquis, continue supportive care, have discussed with her daughter bedside she only wants gentle medical treatment focused towards keeping her comfortable. If there is any further decline she wants to Focus on full Comfort care/Hospice. We'll withhold further stroke workup i.e. no echogram or carotid duplex as patient is not a candidate for aggressive treatment beyond what she is getting. And management will not change.      UTI  Finished IV antibiotics.     Aspirating  Speech Therapy following, high risk for aspiration, chest x-ray stable. On dysphagia 1 diet should continue to follow with speech therapy on a close basis      Hypothyroidism  Hx of thyroid cancer  On synthroid      COPD  Currently stable.  Place on nebulizers (I don't believe the patient can effectively use her inhalers).      Dementia  Continue namenda and exelon patch.      Hypertension  Continue diltiazem       Afib  Continue Diltiazem for rate control  Switched to Eliquis after discussions with Dr. Leonie Man neurologist        Today   Subjective:   [name] today has no headache,no chest abdominal pain,no new weakness tingling or numbness, feels much better  Objective:   Blood pressure  152/62, pulse 80, temperature 97.9 F (36.6 C), temperature source Oral, resp. rate 20, height 5' 4.17" (1.63 m), weight 44.5 kg (98 lb 1.7 oz), SpO2 99.00%.   Intake/Output Summary (Last 24 hours) at 01/09/14 1143 Last data filed at 01/08/14 2102  Gross per 24 hour  Intake    120 ml  Output      0 ml  Net    120 ml    Exam Awake Alert, Oriented x 2, No new F.N deficits, chronic and improving left-sided weakness, Normal affect Ross.AT,PERRAL Supple Neck,No JVD, No cervical lymphadenopathy appriciated.  Symmetrical Chest wall movement, Good air movement bilaterally, CTAB RRR,No Gallops,Rubs or new Murmurs, No Parasternal Heave +ve B.Sounds, Abd Soft, Non tender, No organomegaly appriciated, No rebound -guarding or rigidity. No Cyanosis, Clubbing or edema, No new Rash or bruise  Data Review   CBC w Diff: Lab Results  Component Value Date   WBC 7.4 01/08/2014   WBC 6.6 08/30/2013   HGB 11.9* 01/08/2014   HGB 11.2* 08/30/2013   HCT 36.9 01/08/2014  HCT 35.9 08/30/2013   PLT 232 01/08/2014   PLT 233 08/30/2013   LYMPHOPCT 18 01/07/2014   LYMPHOPCT 14.3 08/30/2013   MONOPCT 7 01/07/2014   MONOPCT 10.7 08/30/2013   EOSPCT 7* 01/07/2014   EOSPCT 1.7 08/30/2013   BASOPCT 1 01/07/2014   BASOPCT 0.5 08/30/2013    CMP: Lab Results  Component Value Date   NA 138 01/07/2014   K 3.6* 01/07/2014   CL 104 01/07/2014   CO2 21 01/07/2014   BUN 17 01/07/2014   CREATININE 0.88 01/07/2014   CREATININE 1.00 12/19/2013   PROT 8.2 01/07/2014   ALBUMIN 3.3* 01/07/2014   BILITOT 0.7 01/07/2014   ALKPHOS 75 01/07/2014   AST 26 01/07/2014   ALT 9 01/07/2014  .   Total Time in preparing paper work, data evaluation and todays exam - 35 minutes  Thurnell Lose M.D on 01/09/2014 at 11:43 AM  [facility] Office  [phone]   **Disclaimer: This note may have been dictated with voice recognition software. Similar sounding words can inadvertently be transcribed and this note may contain transcription errors which may  not have been corrected upon publication of note.**

## 2014-01-09 NOTE — Progress Notes (Signed)
Followed up with Rita Miller briefly and confirmed that [facility] told me that they could have hospice there. Rita Miller says that she is interested in this approach. She is not prepared to complete MOST form at this time as her mother is preparing for discharge anytime now. Asked her to follow up with palliative/hospice at [facility] to complete.  [name], NP Palliative Medicine Team Pager # [phone] (M-F 8a-5p) Team Phone # [phone] (Nights/Weekends)

## 2014-01-09 NOTE — Progress Notes (Signed)
Stroke Team Progress Note  HISTORY Rita Miller is a 80 y.o. female, with a past medical history of CVA with residual slurring of speech and drooling, as well as PAD with stents in her lower extremities, ovarian cancer, thyroid cancer, and aortic stenosis was [place] for vomiting phlegm, decreased responsiveness and left sided weakness. According to her daughter, Rita Miller, she was diagnosed with a UTI and discharged back to her SNF. On Monday 6/1 she was sent to [facility] [place] ED with similar symptoms but discharged home. Her daughter is uncertain why she was discharged. She returns to the ED today (6/2) still with decreased responsiveness, left sided weakness and difficulty with her secretions. She also complains of right sided chest pain that seems m/s in nature. Per her daughter she had a fall approx 3 weeks ago.  In ER her labs are good but she is noted to have a subtherapeutic INR. Most recent CT head is negative for acute changes. We are unable to do an MRI as she has a Psychologist, forensic. Neuro has been called and a CT angio of her head is pending.  Patient was not administered TPA secondary to coumadin tx. She was admitted to the Internal Medicine Service for further evaluation and treatment. Neurology is consulting.   SUBJECTIVE No acute events overnight. Family is at the bedside. Patient is alert, smiling.   OBJECTIVE Most recent Vital Signs: Filed Vitals:   01/08/14 2129 01/09/14 0030 01/09/14 0452 01/09/14 1031  BP: 119/68 133/84 143/79 152/62  Pulse: 73 81 78 80  Temp: 98.4 F (36.9 C) 98 F (36.7 C) 98.3 F (36.8 C) 97.9 F (36.6 C)  TempSrc: Oral Oral Oral Oral  Resp: 16 16 18 20   Height:      Weight:      SpO2: 96% 95% 94% 99%   CBG (last 3)   Recent Labs  01/06/14 2122  GLUCAP 143*    IV Fluid Intake:   . sodium chloride 50 mL/hr at 01/08/14 2102    MEDICATIONS  . ampicillin-sulbactam (UNASYN) IV  1.5 g Intravenous Q8H  . apixaban   2.5 mg Oral BID  . cefTRIAXone (ROCEPHIN)  IV  1 g Intravenous Q24H  . Chlorhexidine Gluconate Cloth  6 each Topical Q0600  . diltiazem  240 mg Oral Daily  . feeding supplement (ENSURE COMPLETE)  237 mL Oral BID BM  . feeding supplement (ENSURE)  1 Container Oral Q24H  . fluticasone  2 puff Inhalation Daily  . levothyroxine  75 mcg Oral QAC breakfast  . memantine  10 mg Oral BID  . metoCLOPramide  10 mg Oral Q6H  . mupirocin ointment   Nasal BID  . pantoprazole  40 mg Oral Daily  . polyethylene glycol  17 g Oral Daily  . Rivastigmine  13.3 mg Transdermal Daily  . senna-docusate  1 tablet Oral QHS  . simvastatin  10 mg Oral q1800  . tiotropium  18 mcg Inhalation Daily   PRN:  acetaminophen, hydrALAZINE, ipratropium-albuterol, sodium chloride, vitamin A & D  Diet:  Dysphagia thin liquids Activity:   Up with assistance DVT Prophylaxis:  Pt on Apixaban  CLINICALLY SIGNIFICANT STUDIES Basic Metabolic Panel:   Recent Labs Lab 01/06/14 2133 01/06/14 2145 01/07/14 1218 01/07/14 1630  NA 141 144 138  --   K 3.8 3.7 3.6*  --   CL 105 102 104  --   CO2 26  --  21  --  GLUCOSE 122* 123* 101*  --   BUN 25* 25* 17  --   CREATININE 1.15* 1.30* 0.87 0.88  CALCIUM 9.2  --  8.9  --    Liver Function Tests:   Recent Labs Lab 01/06/14 2133 01/07/14 1218  AST 22 26  ALT 10 9  ALKPHOS 74 75  BILITOT 0.6 0.7  PROT 8.2 8.2  ALBUMIN 3.3* 3.3*   CBC:  Recent Labs Lab 01/06/14 2133  01/07/14 1218 01/07/14 1630 01/08/14 0500  WBC 5.7  --  5.3 5.0 7.4  NEUTROABS 3.3  --  3.6  --   --   HGB 11.0*  < > 11.5* 11.7* 11.9*  HCT 34.2*  < > 35.3* 35.1* 36.9  MCV 96.3  --  95.9 95.6 96.6  PLT 242  --  235 231 232  < > = values in this interval not displayed. Coagulation:   Recent Labs Lab 01/06/14 2133 01/07/14 1907 01/08/14 0500  LABPROT 18.3* 18.4* 20.0*  INR 1.57* 1.58* 1.76*   Cardiac Enzymes:   Recent Labs Lab 01/03/14 1320  TROPONINI <0.30   Urinalysis:    Recent Labs Lab 01/03/14 1435 01/07/14 1238  COLORURINE YELLOW YELLOW  LABSPEC 1.017 1.015  PHURINE 7.0 7.0  GLUCOSEU NEGATIVE NEGATIVE  HGBUR NEGATIVE NEGATIVE  BILIRUBINUR NEGATIVE NEGATIVE  KETONESUR NEGATIVE NEGATIVE  PROTEINUR 30* 30*  UROBILINOGEN 2.0* 1.0  NITRITE POSITIVE* NEGATIVE  LEUKOCYTESUR MODERATE* NEGATIVE   Lipid Panel    Component Value Date/Time   CHOL 186 01/08/2014 0448   TRIG 55 01/08/2014 0448   HDL 73 01/08/2014 0448   CHOLHDL 2.5 01/08/2014 0448   VLDL 11 01/08/2014 0448   LDLCALC 102* 01/08/2014 0448   HgbA1C  Lab Results  Component Value Date   HGBA1C 5.9* 01/08/2014    Urine Drug Screen:   No results found for this basename: labopia,  cocainscrnur,  labbenz,  amphetmu,  thcu,  labbarb    Alcohol Level: No results found for this basename: ETH,  in the last 168 hours  Ct Head (brain) Wo Contrast  01/06/2014    IMPRESSION: 1. No intracranial hemorrhage or acute infarct identified. 2. Remote right MCA territory infarct. 3. Left maxillary sinusitis.   Ct Angio Head W/cm &/or Wo Cm  01/07/2014 IMPRESSION: 1. Stable non contrast CT appearance of the brain since 01/06/2014. Chronic large right MCA infarct and small chronic left cerebellar lacunar infarct. 2. CTA attempt was aborted following extravasation of IV contrast, and no additional IV access could be obtained.     Dg Chest 2 View  01/07/2014  IMPRESSION: Emphysema and pulmonary scarring without definite acute overlying pulmonary process.     Dg Chest Portable 1 View  01/07/2014   IMPRESSION: Persistent changes in the right upper lobe. No acute abnormality is seen.       EKG  ventricular paced rhythm with prolonged AV conduction   Therapy Recommendations PT recs SNF  Physical Exam   Elderly female not in distress.Awake alert. Afebrile. Head is nontraumatic. Neck is supple without bruit. Hearing is normal. Cardiac exam no murmur or gallop. Lungs are clear to auscultation. Distal pulses are well  felt. Neurological Exam : Awake alert oriented x 3 dysarthric speech  .decrease blink to threat on left side. Mild left lower face asymmetry. Tongue midline. Moderate left hemiparesis 3/5  Mild diminished fine finger movements on left. Orbits right over left upper extremity. Mild left grip weak..mild left hip flexors and ankle dorsiflexors weakness. Normal sensation .  Normal coordination.gait not tested  ASSESSMENT/PLAN Ms. Rita Miller is a 80 y.o. female with PMH HTN, HLD, Atrial fibrillation on coumadin, MV repair, CAD, tachy-brady syndrome with permanent pacemaker, hypothyroidism, prior stroke presenting with decreased responsiveness, L sided weakness and dysarthria. IV tPA not given due to patient on warfarin. CT shows chronic R MCA infarct, no acute process. On warfarin prior to admission. Now on Apixaban for secondary stroke prevention. Stroke work up underway.  New stroke vs recrudescence of old R MCA stroke CT shows NAICP Pt unable to have MRI due to pacemaker LDL 102 HgbA1C 5.9 Pt DNR, family wishes no G tube, considering hospice Palliative care consulting Dispo: to [facility] day # 2  SIGNED Delbert Phenix, MSN, ANP-C, [place], [place]  I have personally obtained a history, examined the patient, evaluated imaging results, and formulated the assessment and plan of care. I agree with the above.  Antony Contras, MD   To contact Stroke Continuity provider, please refer to [email or web address] After hours, contact General Neurology

## 2014-01-09 NOTE — Progress Notes (Addendum)
Pt's daughter given D/C instructions with verbal understanding. I called Helene Kelp at [facility] to give an update on the Pt. Pt D/C'd to SNF via wheelchair @ 1355 with daughter per MD order. Pt's IV was removed prior to D/C. Pt is stable @ D/C and has no other needs. Holli Humbles, RN

## 2014-01-09 NOTE — Clinical Social Work Note (Signed)
Pt to be discharged back to [facility]. Discharge summary has been faxed to SNF. Discharge packet complete and placed on pt's shadow chart. Pt's daughter updated on discharge and will be transporting pt via family vehicle. RN updated on discharge disposition.  Lubertha Sayres, MSW, [facility] Licensed Clinical Social Worker [phone] and [phone]

## 2014-01-10 ENCOUNTER — Telehealth: Payer: Self-pay | Admitting: *Deleted

## 2014-01-10 NOTE — Telephone Encounter (Signed)
[facility] Triage Call Report Triage Record Num: [id number] Operator: Elby Beck Patient Name: Rita Miller Call Date & Time: 01/06/2014 7:55:07PM Patient Phone: [phone] PCP: Drema Pry Patient Gender: Female PCP Fax : [phone] Patient DOB: 19-Jul-1930 Practice Name: [facility] Reason for Call: Caller: [place]; PCP: Drema Pry; CB#: [phone]; Call regarding Signs and symptoms of stroke; increased confusion, onset 01/06/14, progressively increased throughout the day. Emergent sx of 'New or worsening signs and symptoms that may indicate shock' positive for Neurological Deficits. Advised to Activate EMS 911. Protocol(s) Used: Neurological Deficits Recommended Outcome per Protocol: Activate EMS 911 Reason for Outcome: New or worsening signs and symptoms that may indicate shock Care Advice: ~ IMMEDIATE ACTION

## 2014-01-13 ENCOUNTER — Telehealth: Payer: Self-pay

## 2014-01-13 NOTE — Telephone Encounter (Signed)
Per md call [facility] and see what they are needing with all the paperwork that got dropped off today?  I called and spoke to patette and she said that the paperwork is our copies and to just sign the other two papers (the one that states physician please sign and return and med list)  Gave paperwork back to md

## 2014-01-17 ENCOUNTER — Ambulatory Visit (INDEPENDENT_AMBULATORY_CARE_PROVIDER_SITE_OTHER): Payer: Medicare Other | Admitting: Family Medicine

## 2014-01-17 ENCOUNTER — Telehealth: Payer: Self-pay

## 2014-01-17 ENCOUNTER — Encounter: Payer: Self-pay | Admitting: Family Medicine

## 2014-01-17 VITALS — BP 127/57 | HR 72 | Temp 98.1°F | Resp 16 | Ht 59.0 in | Wt 95.0 lb

## 2014-01-17 DIAGNOSIS — R143 Flatulence: Secondary | ICD-10-CM

## 2014-01-17 DIAGNOSIS — I6350 Cerebral infarction due to unspecified occlusion or stenosis of unspecified cerebral artery: Secondary | ICD-10-CM

## 2014-01-17 DIAGNOSIS — Z515 Encounter for palliative care: Secondary | ICD-10-CM

## 2014-01-17 DIAGNOSIS — I679 Cerebrovascular disease, unspecified: Secondary | ICD-10-CM

## 2014-01-17 DIAGNOSIS — R1319 Other dysphagia: Secondary | ICD-10-CM

## 2014-01-17 DIAGNOSIS — R634 Abnormal weight loss: Secondary | ICD-10-CM

## 2014-01-17 DIAGNOSIS — I2510 Atherosclerotic heart disease of native coronary artery without angina pectoris: Secondary | ICD-10-CM

## 2014-01-17 DIAGNOSIS — R140 Abdominal distension (gaseous): Secondary | ICD-10-CM

## 2014-01-17 DIAGNOSIS — Z853 Personal history of malignant neoplasm of breast: Secondary | ICD-10-CM

## 2014-01-17 DIAGNOSIS — Z7901 Long term (current) use of anticoagulants: Secondary | ICD-10-CM

## 2014-01-17 DIAGNOSIS — R142 Eructation: Secondary | ICD-10-CM

## 2014-01-17 DIAGNOSIS — I4891 Unspecified atrial fibrillation: Secondary | ICD-10-CM

## 2014-01-17 DIAGNOSIS — K219 Gastro-esophageal reflux disease without esophagitis: Secondary | ICD-10-CM

## 2014-01-17 DIAGNOSIS — I10 Essential (primary) hypertension: Secondary | ICD-10-CM

## 2014-01-17 DIAGNOSIS — R141 Gas pain: Secondary | ICD-10-CM

## 2014-01-17 DIAGNOSIS — N390 Urinary tract infection, site not specified: Secondary | ICD-10-CM

## 2014-01-17 DIAGNOSIS — I6359 Cerebral infarction due to unspecified occlusion or stenosis of other cerebral artery: Secondary | ICD-10-CM

## 2014-01-17 DIAGNOSIS — Z950 Presence of cardiac pacemaker: Secondary | ICD-10-CM

## 2014-01-17 DIAGNOSIS — Z5181 Encounter for therapeutic drug level monitoring: Secondary | ICD-10-CM

## 2014-01-17 LAB — CBC
HEMATOCRIT: 32.9 % — AB (ref 36.0–46.0)
HEMOGLOBIN: 11.2 g/dL — AB (ref 12.0–15.0)
MCH: 31.5 pg (ref 26.0–34.0)
MCHC: 34 g/dL (ref 30.0–36.0)
MCV: 92.4 fL (ref 78.0–100.0)
Platelets: 313 10*3/uL (ref 150–400)
RBC: 3.56 MIL/uL — AB (ref 3.87–5.11)
RDW: 16.9 % — ABNORMAL HIGH (ref 11.5–15.5)
WBC: 6.4 10*3/uL (ref 4.0–10.5)

## 2014-01-17 MED ORDER — RANITIDINE HCL 300 MG PO TABS: 300.0000 mg | ORAL_TABLET | Freq: Every day | ORAL | Status: DC

## 2014-01-17 NOTE — Progress Notes (Signed)
Pre visit review using our clinic review tool, if applicable. No additional management support is needed unless otherwise documented below in the visit note. 

## 2014-01-17 NOTE — Telephone Encounter (Signed)
Spoke with Vaughan Basta at the nursing home and she states that the pt has not beeen advanced to mechanical soft only Pureed because of aspiration. She states the family can sign a waiver to change diet but would have to wait until the speech therapist come back.

## 2014-01-17 NOTE — Telephone Encounter (Signed)
I asked them what she got sent home on and they told me pureed and that the speech therapist advanced her to mechanical soft. I said that was fine and I gave them an order for speech therapy to evaluate and treat but did not change the diet. Please notify her. If she has any concerns about the current diet she did not state them to me. If the facility needs a new order they need to let me know

## 2014-01-17 NOTE — Telephone Encounter (Signed)
Pt's daughter called and states that Dr Charlett Blake agreed to a certain diet for her mom but the nursing home states that the diet is not documented. Call daughter at [phone] or the nursing home [phone]. Dr Charlett Blake do you recall? Please advise.

## 2014-01-18 LAB — PROTIME-INR
INR: 1.62 — AB (ref <1.50)
Prothrombin Time: 18.9 seconds — ABNORMAL HIGH (ref 11.6–15.2)

## 2014-01-18 LAB — RENAL FUNCTION PANEL
Albumin: 3.5 g/dL (ref 3.5–5.2)
BUN: 16 mg/dL (ref 6–23)
CHLORIDE: 100 meq/L (ref 96–112)
CO2: 25 mEq/L (ref 19–32)
Calcium: 8.9 mg/dL (ref 8.4–10.5)
Creat: 1 mg/dL (ref 0.50–1.10)
GLUCOSE: 77 mg/dL (ref 70–99)
POTASSIUM: 4.3 meq/L (ref 3.5–5.3)
Phosphorus: 3.8 mg/dL (ref 2.3–4.6)
Sodium: 135 mEq/L (ref 135–145)

## 2014-01-20 ENCOUNTER — Encounter (HOSPITAL_BASED_OUTPATIENT_CLINIC_OR_DEPARTMENT_OTHER): Payer: Self-pay

## 2014-01-20 ENCOUNTER — Ambulatory Visit (HOSPITAL_BASED_OUTPATIENT_CLINIC_OR_DEPARTMENT_OTHER)
Admission: RE | Admit: 2014-01-20 | Discharge: 2014-01-20 | Disposition: A | Discharge status: Home / Self Care | Payer: Medicare Other | Source: Ambulatory Visit | Attending: Family Medicine | Admitting: Family Medicine

## 2014-01-20 DIAGNOSIS — R634 Abnormal weight loss: Secondary | ICD-10-CM | POA: Diagnosis present

## 2014-01-20 DIAGNOSIS — Q445 Other congenital malformations of bile ducts: Secondary | ICD-10-CM

## 2014-01-20 DIAGNOSIS — Y929 Unspecified place or not applicable: Secondary | ICD-10-CM | POA: Insufficient documentation

## 2014-01-20 DIAGNOSIS — X58XXXA Exposure to other specified factors, initial encounter: Secondary | ICD-10-CM | POA: Insufficient documentation

## 2014-01-20 DIAGNOSIS — I714 Abdominal aortic aneurysm, without rupture, unspecified: Secondary | ICD-10-CM | POA: Diagnosis not present

## 2014-01-20 DIAGNOSIS — Q4479 Other congenital malformations of liver: Secondary | ICD-10-CM | POA: Insufficient documentation

## 2014-01-20 DIAGNOSIS — S32009A Unspecified fracture of unspecified lumbar vertebra, initial encounter for closed fracture: Secondary | ICD-10-CM | POA: Diagnosis not present

## 2014-01-20 DIAGNOSIS — I7090 Unspecified atherosclerosis: Secondary | ICD-10-CM | POA: Diagnosis not present

## 2014-01-20 DIAGNOSIS — K219 Gastro-esophageal reflux disease without esophagitis: Secondary | ICD-10-CM | POA: Insufficient documentation

## 2014-01-20 DIAGNOSIS — Q441 Other congenital malformations of gallbladder: Secondary | ICD-10-CM | POA: Diagnosis not present

## 2014-01-20 DIAGNOSIS — Q447 Other congenital malformations of liver: Secondary | ICD-10-CM

## 2014-01-20 MED ORDER — IOHEXOL 300 MG/ML  SOLN
100.0000 mL | Freq: Once | INTRAMUSCULAR | Status: AC | PRN
  Administered 2014-01-20: 100 mL via INTRAVENOUS

## 2014-01-21 ENCOUNTER — Telehealth: Payer: Self-pay

## 2014-01-21 NOTE — Telephone Encounter (Signed)
OK to give an order for hospice, I placed a physical referral order int he computer on 01/17/14 when they were in the office

## 2014-01-21 NOTE — Telephone Encounter (Signed)
Pts daughter informed and she doesn't think pt needs one just [facility] asked.   The hospice nurse did get on the phone and state they need a verbal to provide services.  Number to return call is [phone] and to ask for referral dept

## 2014-01-21 NOTE — Telephone Encounter (Signed)
pts daughter left a message stating that Chanda Busing is calling asking her is [facility] did an MRI of patients head?  Please advise? It looks like it was cancelled on 01-07-14?

## 2014-01-21 NOTE — Telephone Encounter (Signed)
No they did not. If they think she needs one why and then she needs to get in for another exam

## 2014-01-22 ENCOUNTER — Telehealth: Payer: Self-pay | Admitting: Internal Medicine

## 2014-01-22 NOTE — Telephone Encounter (Signed)
I received a phone call last night from hospice to let us know that the patient was not able to be admitted to hospice because she is doing physical therapy. FYI

## 2014-01-22 NOTE — Telephone Encounter (Signed)
FYI   Rita Miller was informed at hospice (they found referral last night), however pt is getting therapy through medicare and can't get hospice also. The family is supposed to call hospice when the therapy is done. Rita Miller did state another referral will have to be placed if its over 14 days

## 2014-01-23 ENCOUNTER — Telehealth: Payer: Self-pay

## 2014-01-23 NOTE — Telephone Encounter (Signed)
Albuterol was written in May with 3 refills. So I am not sure what the problem is. She also has a Electrical engineer at the facility? I usually do not advance the diet unless the speech pathologist does a study and advances them. Please check with facility and see what we have to do to order this

## 2014-01-23 NOTE — Telephone Encounter (Signed)
Pts daughter left a message stating that pt is on a purree diet and would like to know when she can go to soft foods? And would like to know about her albuterol refill? pts daughter states she talked to me about this on the 16th and is awaiting an answer?  I called pts daughter back and informed her that she didn't ask me about either one of these and that I would send the message to dr B. I explained all I discussed with her was about the MRI not being done and then she put the Hospice nurse on the phone.   Pts daughter then stated that she spoke to [place] a couple of months ago about the albuterol.  Call was transferred to stephanie to schedule with [facility]

## 2014-01-23 NOTE — Telephone Encounter (Signed)
Please inform pts daughter that in May an RX was sent for albuterol with 3 refills

## 2014-01-24 ENCOUNTER — Encounter: Payer: Self-pay | Admitting: Family Medicine

## 2014-01-26 ENCOUNTER — Encounter: Payer: Self-pay | Admitting: Family Medicine

## 2014-01-26 DIAGNOSIS — K219 Gastro-esophageal reflux disease without esophagitis: Secondary | ICD-10-CM

## 2014-01-26 HISTORY — DX: Gastro-esophageal reflux disease without esophagitis: K21.9

## 2014-01-26 NOTE — Assessment & Plan Note (Signed)
Well controlled, no changes to meds. Encouraged heart healthy diet such as the DASH diet and exercise as tolerated.  °

## 2014-01-26 NOTE — Assessment & Plan Note (Signed)
Referred to cardiology for further management

## 2014-01-26 NOTE — Assessment & Plan Note (Signed)
No evidence of recurrence despite illness

## 2014-01-26 NOTE — Progress Notes (Signed)
Patient ID: Rita Miller, female   DOB: February 07, 1930, 80 y.o.   MRN: [id number] Rita Miller [id number] 10-Apr-1930 01/26/2014      Progress Note-Follow Up  Subjective  Chief Complaint  Chief Complaint  Patient presents with  . Follow-up    HPI  Patient is a 80 year old female in today for routine medical care. Here today for hospital followup accompanied by her daughter and and 8 from her skilled nursing facility. They are requesting a/daily care consult. She continues to lose weight. She notes abdominal bloating. Has a history of breast cancer. Recently hospitalized with left-sided weakness poorly. No recent chest pain but does have a significant cardiac history. Heartburn has been flared. Denies CP/palp/SOB/HA/congestion/fevers  or GU c/o. Taking meds as prescribed  Past Medical History  Diagnosis Date  . CAD (coronary artery disease)   . History of CVA (cerebrovascular accident)   . Atrial fibrillation     peristent  . Hyperlipemia   . COPD (chronic obstructive pulmonary disease)   . Epistaxis     recurrent  . Osteoporosis   . Hypothyroidism   . History of pulmonary embolism   . History of colonic diverticulitis   . History of breast cancer   . Asthma   . Iron deficiency anemia   . History of transfusion of whole blood   . LBBB (left bundle branch block)     history of  . Aortal stenosis     severe  . Pacemaker     bradycardia s/p pacemaker implant  . PAD (peripheral artery disease)     left leg occluded viabahn stents x 3 left SFA  . Tachycardia-bradycardia syndrome     s/p PPM  . Cancer   . Arthritis   . Stroke   . HTN (hypertension) 09/25/2012  . HTN (hypertension) 09/25/2012  . Esophageal reflux 01/26/2014    Past Surgical History  Procedure Laterality Date  . Thyroidectomy      due to cancer  . Total hip arthroplasty    . Abdominal hysterectomy    . Mastectomy    . Mitral valve repair    . Intraocular lens insertion      left eye implant  . Pacemaker  placement      for tachy/brady syndrome    Family History  Problem Relation Age of Onset  . Breast cancer    . Colon cancer    . Diabetes    . Hyperlipidemia    . Hypertension    . Heart disease Mother     unknown heart disease    History   Social History  . Marital Status: Widowed    Spouse Name: N/A    Number of Children: N/A  . Years of Education: N/A   Occupational History  . Not on file.   Social History Main Topics  . Smoking status: Never Smoker   . Smokeless tobacco: Never Used  . Alcohol Use: No  . Drug Use: No  . Sexual Activity: Not on file   Other Topics Concern  . Not on file   Social History Narrative   Retired   Widow    1 daughter             Current Outpatient Prescriptions on File Prior to Visit  Medication Sig Dispense Refill  . acetaminophen (TYLENOL) 500 MG tablet Take 1,000 mg by mouth 2 (two) times daily as needed (pain).      Marland Kitchen albuterol (PROVENTIL HFA;VENTOLIN HFA) 108 (90 BASE)  MCG/ACT inhaler Inhale 2 puffs into the lungs every 6 (six) hours as needed for wheezing or shortness of breath.  1 Inhaler  3  . apixaban (ELIQUIS) 2.5 MG TABS tablet Take 1 tablet (2.5 mg total) by mouth 2 (two) times daily.  60 tablet    . diltiazem (CARDIZEM CD) 240 MG 24 hr capsule Take 240 mg by mouth daily.       . feeding supplement, ENSURE, (ENSURE) PUDG Take 1 Container by mouth daily.    0  . fluticasone (FLOVENT HFA) 44 MCG/ACT inhaler Inhale 2 puffs into the lungs daily.       Marland Kitchen guaifenesin (ROBITUSSIN) 100 MG/5ML syrup Take 100 mg by mouth every 4 (four) hours as needed for cough.      . levothyroxine (SYNTHROID, LEVOTHROID) 75 MCG tablet Take 1 tablet (75 mcg total) by mouth daily.  90 tablet  3  . Melatonin 3 MG TABS Take 3 mg by mouth at bedtime.       . memantine (NAMENDA) 10 MG tablet Take 10 mg by mouth 2 (two) times daily.       . metoCLOPramide (REGLAN) 10 MG tablet Take 1 tablet (10 mg total) by mouth every 6 (six) hours.  30 tablet  0  .  omeprazole (PRILOSEC) 20 MG capsule Take 20 mg by mouth daily. Take on and empty stomach- Do no crush.      . polyethylene glycol (MIRALAX / GLYCOLAX) packet Take 17 g by mouth daily. Mix 17gm in 6-8 ounces of liquid daily.       . pravastatin (PRAVACHOL) 20 MG tablet Take 1 tablet (20 mg total) by mouth daily.  30 tablet  3  . Rivastigmine (EXELON) 13.3 MG/24HR PT24 Place 1 patch onto the skin daily.      Marland Kitchen SALINE NASAL SPRAY NA Place 1 spray into both nostrils 2 (two) times daily. 0.65% saline nasal spray      . tiotropium (SPIRIVA) 18 MCG inhalation capsule Place 18 mcg into inhaler and inhale daily.        . Vaginal Lubricant (REPLENS) GEL Place 1 application vaginally daily as needed (vaginal dryness).      . vitamin E 400 UNIT capsule Take 400 Units by mouth daily.        . Vitamins A & D (VITAMIN A & D) ointment Apply 1 application topically as needed for dry skin.       No current facility-administered medications on file prior to visit.    Allergies  Allergen Reactions  . Donepezil Hydrochloride     REACTION: heart fluttering  . Morphine Sulfate   . Oxycodone-Acetaminophen     REACTION: Hallucinations  . Percocet [Oxycodone-Acetaminophen]     Review of Systems  Review of Systems  Constitutional: Positive for malaise/fatigue. Negative for fever.  HENT: Negative for congestion.   Eyes: Negative for discharge.  Respiratory: Negative for shortness of breath.   Cardiovascular: Negative for chest pain, palpitations and leg swelling.  Gastrointestinal: Positive for abdominal pain. Negative for nausea and diarrhea.  Genitourinary: Negative for dysuria.  Musculoskeletal: Positive for myalgias. Negative for falls.  Skin: Negative for rash.  Neurological: Positive for focal weakness. Negative for loss of consciousness and headaches.  Endo/Heme/Allergies: Negative for polydipsia.  Psychiatric/Behavioral: Positive for memory loss. Negative for depression and suicidal ideas. The  patient is nervous/anxious. The patient does not have insomnia.     Objective  BP 127/57  Pulse 72  Temp(Src) 98.1 F (36.7 C) (Oral)  Resp 16  Ht 4\' 11"  (1.499 m)  Wt 95 lb (43.092 kg)  BMI 19.18 kg/m2  SpO2 97%  Physical Exam  Physical Exam  Constitutional: She is oriented to person, place, and time and well-developed, well-nourished, and in no distress. No distress.  Frail, thin, in wheelchair  HENT:  Head: Normocephalic and atraumatic.  Eyes: Conjunctivae are normal.  Neck: Neck supple. No thyromegaly present.  Cardiovascular: Normal rate and regular rhythm.   Murmur heard. Pacer noted in chest wall  Pulmonary/Chest: Effort normal and breath sounds normal. She has no wheezes.  Abdominal: She exhibits no distension and no mass.  Musculoskeletal: She exhibits no edema.  Lymphadenopathy:    She has no cervical adenopathy.  Neurological: She is alert and oriented to person, place, and time.  Skin: Skin is warm and dry. No rash noted. She is not diaphoretic.  Psychiatric: Memory, affect and judgment normal.    Lab Results  Component Value Date   TSH 1.136 11/14/2013   Lab Results  Component Value Date   WBC 6.4 01/17/2014   HGB 11.2* 01/17/2014   HCT 32.9* 01/17/2014   MCV 92.4 01/17/2014   PLT 313 01/17/2014   Lab Results  Component Value Date   CREATININE 1.00 01/17/2014   BUN 16 01/17/2014   NA 135 01/17/2014   K 4.3 01/17/2014   CL 100 01/17/2014   CO2 25 01/17/2014   Lab Results  Component Value Date   ALT 9 01/07/2014   AST 26 01/07/2014   ALKPHOS 75 01/07/2014   BILITOT 0.7 01/07/2014   Lab Results  Component Value Date   CHOL 186 01/08/2014   Lab Results  Component Value Date   HDL 73 01/08/2014   Lab Results  Component Value Date   LDLCALC 102* 01/08/2014   Lab Results  Component Value Date   TRIG 55 01/08/2014   Lab Results  Component Value Date   CHOLHDL 2.5 01/08/2014     Assessment & Plan  HTN (hypertension) Well controlled, no changes to meds.  Encouraged heart healthy diet such as the DASH diet and exercise as tolerated.   CVA (cerebral infarction) Recently hospitalized for left sided weakness. Symptoms have improved  Dementia Continues to worsen  Abdominal distention With weight loss, CT abdomen not diagnostic  PACEMAKER-[employer] Referred to cardiology for further management  History of breast cancer No evidence of recurrence despite illness  Palliative care encounter Referred for Palliative care consult given numerous conditions  Esophageal reflux Started on Zantac. Avoid offending foods, start probiotics. Do not eat large meals in late evening and consider raising head of bed.

## 2014-01-26 NOTE — Assessment & Plan Note (Signed)
Recently hospitalized for left sided weakness. Symptoms have improved

## 2014-01-26 NOTE — Assessment & Plan Note (Signed)
Referred for Palliative care consult given numerous conditions

## 2014-01-26 NOTE — Assessment & Plan Note (Signed)
With weight loss, CT abdomen not diagnostic

## 2014-01-26 NOTE — Assessment & Plan Note (Signed)
Started on Zantac. Avoid offending foods, start probiotics. Do not eat large meals in late evening and consider raising head of bed.

## 2014-01-26 NOTE — Assessment & Plan Note (Signed)
Continues to worsen

## 2014-01-28 NOTE — Telephone Encounter (Signed)
Appt scheduled with Dr. Charlett Blake on 01/31/14

## 2014-01-29 NOTE — Consult Note (Signed)
I have reviewed and discussed the care of this patient in detail with the nurse practitioner including pertinent patient records, physical exam findings and data. I agree with details of this encounter.  

## 2014-01-31 ENCOUNTER — Telehealth: Payer: Self-pay

## 2014-01-31 ENCOUNTER — Ambulatory Visit: Payer: Medicare Other | Admitting: Family Medicine

## 2014-01-31 NOTE — Telephone Encounter (Signed)
Letter faxed to [facility]

## 2014-02-06 ENCOUNTER — Ambulatory Visit: Payer: Medicare Other | Admitting: Physician Assistant

## 2014-02-13 ENCOUNTER — Ambulatory Visit (INDEPENDENT_AMBULATORY_CARE_PROVIDER_SITE_OTHER): Payer: Medicare Other | Admitting: Physician Assistant

## 2014-02-13 ENCOUNTER — Telehealth: Payer: Self-pay

## 2014-02-13 ENCOUNTER — Encounter: Payer: Self-pay | Admitting: Physician Assistant

## 2014-02-13 VITALS — BP 137/66 | HR 70 | Temp 98.0°F | Resp 16 | Ht 59.0 in | Wt 93.2 lb

## 2014-02-13 DIAGNOSIS — R195 Other fecal abnormalities: Secondary | ICD-10-CM

## 2014-02-13 DIAGNOSIS — I2510 Atherosclerotic heart disease of native coronary artery without angina pectoris: Secondary | ICD-10-CM

## 2014-02-13 DIAGNOSIS — Z87440 Personal history of urinary (tract) infections: Secondary | ICD-10-CM

## 2014-02-13 NOTE — Progress Notes (Signed)
Patient presents to clinic today with her daughter and nursing home staff.  Daughter states patient was being cleaned by CNA at SNF who was wiping her mother back-to-front.  Is concerned she may have a UTI.  Patient denies urgency, frequency, hematuria, back pain, nausea or vomiting.  Patient does have some dementia which makes her not the best historian. Nursing home agent present denies increased bathroom usage or incontinence episodes.  Also denies note of other UTI s/s in patient. Does note that the patient seems to be having more frequent bowel movements and wonders if Miralax is being given too frequently.  Is currently being given on a daily basis.  Patient denies abdominal pain, nausea or vomiting.  Past Medical History  Diagnosis Date  . CAD (coronary artery disease)   . History of CVA (cerebrovascular accident)   . Atrial fibrillation     peristent  . Hyperlipemia   . COPD (chronic obstructive pulmonary disease)   . Epistaxis     recurrent  . Osteoporosis   . Hypothyroidism   . History of pulmonary embolism   . History of colonic diverticulitis   . History of breast cancer   . Asthma   . Iron deficiency anemia   . History of transfusion of whole blood   . LBBB (left bundle branch block)     history of  . Aortal stenosis     severe  . Pacemaker     bradycardia s/p pacemaker implant  . PAD (peripheral artery disease)     left leg occluded viabahn stents x 3 left SFA  . Tachycardia-bradycardia syndrome     s/p PPM  . Cancer   . Arthritis   . Stroke   . HTN (hypertension) 09/25/2012  . HTN (hypertension) 09/25/2012  . Esophageal reflux 01/26/2014    Current Outpatient Prescriptions on File Prior to Visit  Medication Sig Dispense Refill  . acetaminophen (TYLENOL) 500 MG tablet Take 1,000 mg by mouth 2 (two) times daily as needed (pain).      Marland Kitchen albuterol (PROVENTIL HFA;VENTOLIN HFA) 108 (90 BASE) MCG/ACT inhaler Inhale 2 puffs into the lungs every 6 (six) hours as needed for  wheezing or shortness of breath.  1 Inhaler  3  . apixaban (ELIQUIS) 2.5 MG TABS tablet Take 1 tablet (2.5 mg total) by mouth 2 (two) times daily.  60 tablet    . diltiazem (CARDIZEM CD) 240 MG 24 hr capsule Take 240 mg by mouth daily.       . fluticasone (FLOVENT HFA) 44 MCG/ACT inhaler Inhale 2 puffs into the lungs daily.       Marland Kitchen guaifenesin (ROBITUSSIN) 100 MG/5ML syrup Take 100 mg by mouth every 4 (four) hours as needed for cough.      . levothyroxine (SYNTHROID, LEVOTHROID) 75 MCG tablet Take 1 tablet (75 mcg total) by mouth daily.  90 tablet  3  . memantine (NAMENDA) 10 MG tablet Take 10 mg by mouth 2 (two) times daily.       . polyethylene glycol (MIRALAX / GLYCOLAX) packet Take 17 g by mouth daily. Mix 17gm in 6-8 ounces of liquid daily.       . pravastatin (PRAVACHOL) 20 MG tablet Take 1 tablet (20 mg total) by mouth daily.  30 tablet  3  . ranitidine (ZANTAC) 300 MG tablet Take 1 tablet (300 mg total) by mouth at bedtime.  30 tablet  2  . Rivastigmine (EXELON) 13.3 MG/24HR PT24 Place 1 patch onto the skin daily.      Marland Kitchen  SALINE NASAL SPRAY NA Place 1 spray into both nostrils 2 (two) times daily. 0.65% saline nasal spray      . tiotropium (SPIRIVA) 18 MCG inhalation capsule Place 18 mcg into inhaler and inhale daily.        . Vaginal Lubricant (REPLENS) GEL Place 1 application vaginally daily as needed (vaginal dryness).      . vitamin E 400 UNIT capsule Take 400 Units by mouth daily.        . feeding supplement, ENSURE, (ENSURE) PUDG Take 1 Container by mouth daily.    0  . omeprazole (PRILOSEC) 20 MG capsule Take 20 mg by mouth daily. Take on and empty stomach- Do no crush.      . Vitamins A & D (VITAMIN A & D) ointment Apply 1 application topically as needed for dry skin.       No current facility-administered medications on file prior to visit.    Allergies  Allergen Reactions  . Donepezil Hydrochloride     REACTION: heart fluttering  . Morphine Sulfate   .  Oxycodone-Acetaminophen     REACTION: Hallucinations  . Percocet [Oxycodone-Acetaminophen]     Family History  Problem Relation Age of Onset  . Breast cancer    . Colon cancer    . Diabetes    . Hyperlipidemia    . Hypertension    . Heart disease Mother     unknown heart disease    History   Social History  . Marital Status: Widowed    Spouse Name: N/A    Number of Children: N/A  . Years of Education: N/A   Social History Main Topics  . Smoking status: Former Research scientist (life sciences)  . Smokeless tobacco: Never Used     Comment: Quit >20 years  . Alcohol Use: No  . Drug Use: No  . Sexual Activity: None   Other Topics Concern  . None   Social History Narrative   Retired   Widow    1 daughter             Review of Systems - See HPI.  All other ROS are negative.  BP 137/66  Pulse 70  Temp(Src) 98 F (36.7 C) (Oral)  Resp 16  Ht _0  (1.499 m)  Wt 93 lb 4 oz (42.298 kg)  BMI 18.82 kg/m2  SpO2 98%  Physical Exam  Vitals reviewed. Constitutional: She is well-developed, well-nourished, and in no distress.  HENT:  Head: Normocephalic and atraumatic.  Eyes: Conjunctivae are normal.  Neck: Neck supple.  Cardiovascular: Normal rate, regular rhythm, normal heart sounds and intact distal pulses.   Pulmonary/Chest: Effort normal and breath sounds normal. No respiratory distress. She has no wheezes. She has no rales. She exhibits no tenderness.  Abdominal: Soft. Bowel sounds are normal. She exhibits no distension and no mass. There is no tenderness. There is no rebound and no guarding.  Neurological:  A/O x 1.  Skin: Skin is warm and dry. No rash noted.    Recent Results (from the past 2160 hour(s))  CBC WITH DIFFERENTIAL     Status: Abnormal   Collection Time    12/19/13 12:32 PM      Result Value Ref Range   WBC 9.9  4.0 - 10.5 K/uL   RBC 4.09  3.87 - 5.11 MIL/uL   Hemoglobin 12.3  12.0 - 15.0 g/dL   HCT 37.1  36.0 - 46.0 %   MCV 90.7  78.0 - 100.0 fL  MCH 30.1   26.0 - 34.0 pg   MCHC 33.2  30.0 - 36.0 g/dL   RDW 16.2 (*) 11.5 - 15.5 %   Platelets 292  150 - 400 K/uL   Neutrophils Relative % 66  43 - 77 %   Neutro Abs 6.5  1.7 - 7.7 K/uL   Lymphocytes Relative 14  12 - 46 %   Lymphs Abs 1.4  0.7 - 4.0 K/uL   Monocytes Relative 6  3 - 12 %   Monocytes Absolute 0.6  0.1 - 1.0 K/uL   Eosinophils Relative 14 (*) 0 - 5 %   Eosinophils Absolute 1.4 (*) 0.0 - 0.7 K/uL   Basophils Relative 0  0 - 1 %   Basophils Absolute 0.0  0.0 - 0.1 K/uL   Smear Review SEE NOTE     Comment: Mild left shift (1-5% metas, occ myelo, occ bands)     RBCs and platelets are unremarkable.        COMPREHENSIVE METABOLIC PANEL     Status: Abnormal   Collection Time    12/19/13 12:32 PM      Result Value Ref Range   Sodium 137  135 - 145 mEq/L   Potassium 4.3  3.5 - 5.3 mEq/L   Chloride 104  96 - 112 mEq/L   CO2 26  19 - 32 mEq/L   Glucose, Bld 81  70 - 99 mg/dL   BUN 26 (*) 6 - 23 mg/dL   Creat 1.00  0.50 - 1.10 mg/dL   Total Bilirubin 0.8  0.2 - 1.2 mg/dL   Alkaline Phosphatase 66  39 - 117 U/L   AST 26  0 - 37 U/L   ALT 23  0 - 35 U/L   Total Protein 6.1  6.0 - 8.3 g/dL   Albumin 3.1 (*) 3.5 - 5.2 g/dL   Calcium 9.0  8.4 - 10.5 mg/dL  LIPASE, BLOOD     Status: None   Collection Time    01/03/14  1:20 PM      Result Value Ref Range   Lipase 21  11 - 59 U/L  COMPREHENSIVE METABOLIC PANEL     Status: Abnormal   Collection Time    01/03/14  1:20 PM      Result Value Ref Range   Sodium 142  137 - 147 mEq/L   Potassium 4.1  3.7 - 5.3 mEq/L   Chloride 106  96 - 112 mEq/L   CO2 24  19 - 32 mEq/L   Glucose, Bld 96  70 - 99 mg/dL   BUN 23  6 - 23 mg/dL   Creatinine, Ser 1.10  0.50 - 1.10 mg/dL   Calcium 9.2  8.4 - 10.5 mg/dL   Total Protein 7.8  6.0 - 8.3 g/dL   Albumin 3.4 (*) 3.5 - 5.2 g/dL   AST 23  0 - 37 U/L   ALT 11  0 - 35 U/L   Alkaline Phosphatase 70  39 - 117 U/L   Total Bilirubin 0.7  0.3 - 1.2 mg/dL   GFR calc non Af Amer 46 (*) >90 mL/min    GFR calc Af Amer 53 (*) >90 mL/min   Comment: (NOTE)     The eGFR has been calculated using the CKD EPI equation.     This calculation has not been validated in all clinical situations.     eGFR's persistently <90 mL/min signify possible Chronic Kidney     Disease.  CBC WITH DIFFERENTIAL     Status: Abnormal   Collection Time    01/03/14  1:20 PM      Result Value Ref Range   WBC 5.3  4.0 - 10.5 K/uL   RBC 3.41 (*) 3.87 - 5.11 MIL/uL   Hemoglobin 10.9 (*) 12.0 - 15.0 g/dL   HCT 33.5 (*) 36.0 - 46.0 %   MCV 98.2  78.0 - 100.0 fL   MCH 32.0  26.0 - 34.0 pg   MCHC 32.5  30.0 - 36.0 g/dL   RDW 17.4 (*) 11.5 - 15.5 %   Platelets 264  150 - 400 K/uL   Neutrophils Relative % 58  43 - 77 %   Neutro Abs 3.1  1.7 - 7.7 K/uL   Lymphocytes Relative 23  12 - 46 %   Lymphs Abs 1.2  0.7 - 4.0 K/uL   Monocytes Relative 6  3 - 12 %   Monocytes Absolute 0.3  0.1 - 1.0 K/uL   Eosinophils Relative 12 (*) 0 - 5 %   Eosinophils Absolute 0.6  0.0 - 0.7 K/uL   Basophils Relative 1  0 - 1 %   Basophils Absolute 0.1  0.0 - 0.1 K/uL  TROPONIN I     Status: None   Collection Time    01/03/14  1:20 PM      Result Value Ref Range   Troponin I <0.30  <0.30 ng/mL   Comment:            Due to the release kinetics of cTnI,     a negative result within the first hours     of the onset of symptoms does not rule out     myocardial infarction with certainty.     If myocardial infarction is still suspected,     repeat the test at appropriate intervals.  I-STAT CG4 LACTIC ACID, ED     Status: None   Collection Time    01/03/14  1:29 PM      Result Value Ref Range   Lactic Acid, Venous 0.54  0.5 - 2.2 mmol/L  URINALYSIS, ROUTINE W REFLEX MICROSCOPIC     Status: Abnormal   Collection Time    01/03/14  2:35 PM      Result Value Ref Range   Color, Urine YELLOW  YELLOW   APPearance CLOUDY (*) CLEAR   Specific Gravity, Urine 1.017  1.005 - 1.030   pH 7.0  5.0 - 8.0   Glucose, UA NEGATIVE  NEGATIVE mg/dL    Hgb urine dipstick NEGATIVE  NEGATIVE   Bilirubin Urine NEGATIVE  NEGATIVE   Ketones, ur NEGATIVE  NEGATIVE mg/dL   Protein, ur 30 (*) NEGATIVE mg/dL   Urobilinogen, UA 2.0 (*) 0.0 - 1.0 mg/dL   Nitrite POSITIVE (*) NEGATIVE   Leukocytes, UA MODERATE (*) NEGATIVE  URINE CULTURE     Status: None   Collection Time    01/03/14  2:35 PM      Result Value Ref Range   Specimen Description URINE, CLEAN CATCH     Special Requests NONE     Culture  Setup Time       Value: 01/04/2014 19:30     Performed at [facility]       Value: >=100,000 COLONIES/ML     Performed at [facility]   Culture       Value: March ARB  Performed at [facility]   Report Status 01/07/2014 FINAL     Organism ID, Bacteria ESCHERICHIA COLI     Organism ID, Bacteria ENTEROCOCCUS SPECIES    URINE MICROSCOPIC-ADD ON     Status: Abnormal   Collection Time    01/03/14  2:35 PM      Result Value Ref Range   Squamous Epithelial / LPF RARE  RARE   WBC, UA 21-50  <3 WBC/hpf   RBC / HPF 0-2  <3 RBC/hpf   Bacteria, UA MANY (*) RARE  CBG MONITORING, ED     Status: Abnormal   Collection Time    01/06/14  9:22 PM      Result Value Ref Range   Glucose-Capillary 143 (*) 70 - 99 mg/dL  PROTIME-INR     Status: Abnormal   Collection Time    01/06/14  9:33 PM      Result Value Ref Range   Prothrombin Time 18.3 (*) 11.6 - 15.2 seconds   INR 1.57 (*) 0.00 - 1.49  APTT     Status: Abnormal   Collection Time    01/06/14  9:33 PM      Result Value Ref Range   aPTT 41 (*) 24 - 37 seconds   Comment:            IF BASELINE aPTT IS ELEVATED,     SUGGEST PATIENT RISK ASSESSMENT     BE USED TO DETERMINE APPROPRIATE     ANTICOAGULANT THERAPY.  CBC     Status: Abnormal   Collection Time    01/06/14  9:33 PM      Result Value Ref Range   WBC 5.7  4.0 - 10.5 K/uL   RBC 3.55 (*) 3.87 - 5.11 MIL/uL   Hemoglobin 11.0 (*) 12.0 - 15.0 g/dL   HCT 34.2 (*)  36.0 - 46.0 %   MCV 96.3  78.0 - 100.0 fL   MCH 31.0  26.0 - 34.0 pg   MCHC 32.2  30.0 - 36.0 g/dL   RDW 17.4 (*) 11.5 - 15.5 %   Platelets 242  150 - 400 K/uL  DIFFERENTIAL     Status: Abnormal   Collection Time    01/06/14  9:33 PM      Result Value Ref Range   Neutrophils Relative % 57  43 - 77 %   Neutro Abs 3.3  1.7 - 7.7 K/uL   Lymphocytes Relative 24  12 - 46 %   Lymphs Abs 1.4  0.7 - 4.0 K/uL   Monocytes Relative 8  3 - 12 %   Monocytes Absolute 0.4  0.1 - 1.0 K/uL   Eosinophils Relative 10 (*) 0 - 5 %   Eosinophils Absolute 0.5  0.0 - 0.7 K/uL   Basophils Relative 1  0 - 1 %   Basophils Absolute 0.1  0.0 - 0.1 K/uL  COMPREHENSIVE METABOLIC PANEL     Status: Abnormal   Collection Time    01/06/14  9:33 PM      Result Value Ref Range   Sodium 141  137 - 147 mEq/L   Potassium 3.8  3.7 - 5.3 mEq/L   Chloride 105  96 - 112 mEq/L   CO2 26  19 - 32 mEq/L   Glucose, Bld 122 (*) 70 - 99 mg/dL   BUN 25 (*) 6 - 23 mg/dL   Creatinine, Ser 1.15 (*) 0.50 - 1.10 mg/dL   Calcium 9.2  8.4 - 10.5  mg/dL   Total Protein 8.2  6.0 - 8.3 g/dL   Albumin 3.3 (*) 3.5 - 5.2 g/dL   AST 22  0 - 37 U/L   ALT 10  0 - 35 U/L   Alkaline Phosphatase 74  39 - 117 U/L   Total Bilirubin 0.6  0.3 - 1.2 mg/dL   GFR calc non Af Amer 43 (*) >90 mL/min   GFR calc Af Amer 50 (*) >90 mL/min   Comment: (NOTE)     The eGFR has been calculated using the CKD EPI equation.     This calculation has not been validated in all clinical situations.     eGFR's persistently <90 mL/min signify possible Chronic Kidney     Disease.  Randolm Idol, ED     Status: None   Collection Time    01/06/14  9:39 PM      Result Value Ref Range   Troponin i, poc 0.01  0.00 - 0.08 ng/mL   Comment 3            Comment: Due to the release kinetics of cTnI,     a negative result within the first hours     of the onset of symptoms does not rule out     myocardial infarction with certainty.     If myocardial infarction is  still suspected,     repeat the test at appropriate intervals.  I-STAT CHEM 8, ED     Status: Abnormal   Collection Time    01/06/14  9:45 PM      Result Value Ref Range   Sodium 144  137 - 147 mEq/L   Potassium 3.7  3.7 - 5.3 mEq/L   Chloride 102  96 - 112 mEq/L   BUN 25 (*) 6 - 23 mg/dL   Creatinine, Ser 1.30 (*) 0.50 - 1.10 mg/dL   Glucose, Bld 123 (*) 70 - 99 mg/dL   Calcium, Ion 1.24  1.13 - 1.30 mmol/L   TCO2 24  0 - 100 mmol/L   Hemoglobin 12.6  12.0 - 15.0 g/dL   HCT 37.0  36.0 - 46.0 %  CBC WITH DIFFERENTIAL     Status: Abnormal   Collection Time    01/07/14 12:18 PM      Result Value Ref Range   WBC 5.3  4.0 - 10.5 K/uL   RBC 3.68 (*) 3.87 - 5.11 MIL/uL   Hemoglobin 11.5 (*) 12.0 - 15.0 g/dL   HCT 35.3 (*) 36.0 - 46.0 %   MCV 95.9  78.0 - 100.0 fL   MCH 31.3  26.0 - 34.0 pg   MCHC 32.6  30.0 - 36.0 g/dL   RDW 17.3 (*) 11.5 - 15.5 %   Platelets 235  150 - 400 K/uL   Neutrophils Relative % 67  43 - 77 %   Neutro Abs 3.6  1.7 - 7.7 K/uL   Lymphocytes Relative 18  12 - 46 %   Lymphs Abs 1.0  0.7 - 4.0 K/uL   Monocytes Relative 7  3 - 12 %   Monocytes Absolute 0.4  0.1 - 1.0 K/uL   Eosinophils Relative 7 (*) 0 - 5 %   Eosinophils Absolute 0.4  0.0 - 0.7 K/uL   Basophils Relative 1  0 - 1 %   Basophils Absolute 0.0  0.0 - 0.1 K/uL  COMPREHENSIVE METABOLIC PANEL     Status: Abnormal   Collection Time    01/07/14 12:18 PM  Result Value Ref Range   Sodium 138  137 - 147 mEq/L   Potassium 3.6 (*) 3.7 - 5.3 mEq/L   Chloride 104  96 - 112 mEq/L   CO2 21  19 - 32 mEq/L   Glucose, Bld 101 (*) 70 - 99 mg/dL   BUN 17  6 - 23 mg/dL   Creatinine, Ser 0.87  0.50 - 1.10 mg/dL   Calcium 8.9  8.4 - 10.5 mg/dL   Total Protein 8.2  6.0 - 8.3 g/dL   Albumin 3.3 (*) 3.5 - 5.2 g/dL   AST 26  0 - 37 U/L   ALT 9  0 - 35 U/L   Alkaline Phosphatase 75  39 - 117 U/L   Total Bilirubin 0.7  0.3 - 1.2 mg/dL   GFR calc non Af Amer 61 (*) >90 mL/min   GFR calc Af Amer 70 (*) >90  mL/min   Comment: (NOTE)     The eGFR has been calculated using the CKD EPI equation.     This calculation has not been validated in all clinical situations.     eGFR's persistently <90 mL/min signify possible Chronic Kidney     Disease.  URINALYSIS, ROUTINE W REFLEX MICROSCOPIC     Status: Abnormal   Collection Time    01/07/14 12:38 PM      Result Value Ref Range   Color, Urine YELLOW  YELLOW   APPearance CLEAR  CLEAR   Specific Gravity, Urine 1.015  1.005 - 1.030   pH 7.0  5.0 - 8.0   Glucose, UA NEGATIVE  NEGATIVE mg/dL   Hgb urine dipstick NEGATIVE  NEGATIVE   Bilirubin Urine NEGATIVE  NEGATIVE   Ketones, ur NEGATIVE  NEGATIVE mg/dL   Protein, ur 30 (*) NEGATIVE mg/dL   Urobilinogen, UA 1.0  0.0 - 1.0 mg/dL   Nitrite NEGATIVE  NEGATIVE   Leukocytes, UA NEGATIVE  NEGATIVE  URINE MICROSCOPIC-ADD ON     Status: Abnormal   Collection Time    01/07/14 12:38 PM      Result Value Ref Range   Squamous Epithelial / LPF FEW (*) RARE   WBC, UA 0-2  <3 WBC/hpf   RBC / HPF 0-2  <3 RBC/hpf   Bacteria, UA RARE  RARE  MRSA PCR SCREENING     Status: Abnormal   Collection Time    01/07/14  4:20 PM      Result Value Ref Range   MRSA by PCR POSITIVE (*) NEGATIVE   Comment:            The GeneXpert MRSA Assay (FDA     approved for NASAL specimens     only), is one component of a     comprehensive MRSA colonization     surveillance program. It is not     intended to diagnose MRSA     infection nor to guide or     monitor treatment for     MRSA infections.     RESULT CALLED TO, READ BACK BY AND VERIFIED WITH:     J.ALLEN,RN AT 1826 BY L.PITT 01/07/14.  CBC     Status: Abnormal   Collection Time    01/07/14  4:30 PM      Result Value Ref Range   WBC 5.0  4.0 - 10.5 K/uL   RBC 3.67 (*) 3.87 - 5.11 MIL/uL   Hemoglobin 11.7 (*) 12.0 - 15.0 g/dL   HCT 35.1 (*) 36.0 - 46.0 %  MCV 95.6  78.0 - 100.0 fL   MCH 31.9  26.0 - 34.0 pg   MCHC 33.3  30.0 - 36.0 g/dL   RDW 17.4 (*) 11.5 - 15.5  %   Platelets 231  150 - 400 K/uL  CREATININE, SERUM     Status: Abnormal   Collection Time    01/07/14  4:30 PM      Result Value Ref Range   Creatinine, Ser 0.88  0.50 - 1.10 mg/dL   GFR calc non Af Amer 60 (*) >90 mL/min   GFR calc Af Amer 70 (*) >90 mL/min   Comment: (NOTE)     The eGFR has been calculated using the CKD EPI equation.     This calculation has not been validated in all clinical situations.     eGFR's persistently <90 mL/min signify possible Chronic Kidney     Disease.  PROTIME-INR     Status: Abnormal   Collection Time    01/07/14  7:07 PM      Result Value Ref Range   Prothrombin Time 18.4 (*) 11.6 - 15.2 seconds   INR 1.58 (*) 0.00 - 1.49  LIPID PANEL     Status: Abnormal   Collection Time    01/08/14  4:48 AM      Result Value Ref Range   Cholesterol 186  0 - 200 mg/dL   Triglycerides 55  <150 mg/dL   HDL 73  >39 mg/dL   Total CHOL/HDL Ratio 2.5     VLDL 11  0 - 40 mg/dL   LDL Cholesterol 102 (*) 0 - 99 mg/dL   Comment:            Total Cholesterol/HDL:CHD Risk     Coronary Heart Disease Risk Table                         Men   Women      1/2 Average Risk   3.4   3.3      Average Risk       5.0   4.4      2 X Average Risk   9.6   7.1      3 X Average Risk  23.4   11.0                Use the calculated Patient Ratio     above and the CHD Risk Table     to determine the patient's CHD Risk.                ATP III CLASSIFICATION (LDL):      <100     mg/dL   Optimal      100-129  mg/dL   Near or Above                        Optimal      130-159  mg/dL   Borderline      160-189  mg/dL   High      >190     mg/dL   Very High  CBC     Status: Abnormal   Collection Time    01/08/14  5:00 AM      Result Value Ref Range   WBC 7.4  4.0 - 10.5 K/uL   RBC 3.82 (*) 3.87 - 5.11 MIL/uL   Hemoglobin 11.9 (*) 12.0 - 15.0 g/dL   HCT 36.9  36.0 -  46.0 %   MCV 96.6  78.0 - 100.0 fL   MCH 31.2  26.0 - 34.0 pg   MCHC 32.2  30.0 - 36.0 g/dL   RDW 17.4 (*) 11.5  - 15.5 %   Platelets 232  150 - 400 K/uL  HEMOGLOBIN A1C     Status: Abnormal   Collection Time    01/08/14  5:00 AM      Result Value Ref Range   Hemoglobin A1C 5.9 (*) <5.7 %   Comment: (NOTE)                                                                               According to the ADA Clinical Practice Recommendations for 2011, when     HbA1c is used as a screening test:      >=6.5%   Diagnostic of Diabetes Mellitus               (if abnormal result is confirmed)     5.7-6.4%   Increased risk of developing Diabetes Mellitus     References:Diagnosis and Classification of Diabetes Mellitus,Diabetes     BSJG,2836,62(HUTML 1):S62-S69 and Standards of Medical Care in             Diabetes - 2011,Diabetes Care,2011,34 (Suppl 1):S11-S61.   Mean Plasma Glucose 123 (*) <117 mg/dL   Comment: Performed at [facility]     Status: Abnormal   Collection Time    01/08/14  5:00 AM      Result Value Ref Range   Prothrombin Time 20.0 (*) 11.6 - 15.2 seconds   INR 1.76 (*) 0.00 - 1.49  PROTIME-INR     Status: Abnormal   Collection Time    01/17/14  1:52 PM      Result Value Ref Range   Prothrombin Time 18.9 (*) 11.6 - 15.2 seconds   INR 1.62 (*) <1.50   Comment: The INR is of principal utility in following patients on stable doses     of oral anticoagulants.  The therapeutic range is generally 2.0 to     3.0, but may be 3.0 to 4.0 in patients with mechanical cardiac valves,     recurrent embolisms and antiphospholipid antibodies (including lupus     inhibitors).  CBC     Status: Abnormal   Collection Time    01/17/14  1:52 PM      Result Value Ref Range   WBC 6.4  4.0 - 10.5 K/uL   RBC 3.56 (*) 3.87 - 5.11 MIL/uL   Hemoglobin 11.2 (*) 12.0 - 15.0 g/dL   HCT 32.9 (*) 36.0 - 46.0 %   MCV 92.4  78.0 - 100.0 fL   MCH 31.5  26.0 - 34.0 pg   MCHC 34.0  30.0 - 36.0 g/dL   RDW 16.9 (*) 11.5 - 15.5 %   Platelets 313  150 - 400 K/uL  RENAL FUNCTION PANEL     Status:  None   Collection Time    01/17/14  1:52 PM      Result Value Ref Range   Sodium 135  135 - 145 mEq/L   Potassium 4.3  3.5 - 5.3 mEq/L  Chloride 100  96 - 112 mEq/L   CO2 25  19 - 32 mEq/L   Glucose, Bld 77  70 - 99 mg/dL   BUN 16  6 - 23 mg/dL   Creat 1.00  0.50 - 1.10 mg/dL   Albumin 3.5  3.5 - 5.2 g/dL   Calcium 8.9  8.4 - 10.5 mg/dL   Phosphorus 3.8  2.3 - 4.6 mg/dL    Assessment/Plan: Loose stools Decrease Miralax to QOD dosing.  Will obtain stool culture and C. Diff assay.  Will also obtain CBC, CMP, H. Pylori.  We were unable to obtain labs in clinic due to patient becoming combative.  SNF agent given written orders to obtain samples and send results to our office.   History of UTI Will obtain UA and urine culture at SNF.  Results to be faxed to office.  Recommend good fluid intake.  Can start daily probiotic.

## 2014-02-13 NOTE — Telephone Encounter (Signed)
Pts daughter left a message stating that she would like a call back. She has a question about todays appt and is requesting a callback

## 2014-02-13 NOTE — Patient Instructions (Signed)
Please obtain labs.  I will call you with your results.  The nursing home will be obtaining your urinalysis, urine culture, stool culture and C. Diff assay.  They will send the results to me.  For now, I am decreasing your miralax to every other day dosing.  Continue other medications as prescribed.  We will schedule your follow-up based on lab results.

## 2014-02-13 NOTE — Progress Notes (Signed)
Pre visit review using our clinic review tool, if applicable. No additional management support is needed unless otherwise documented below in the visit note/SLS  

## 2014-02-14 NOTE — Telephone Encounter (Signed)
Spoke with patient's daughter personally.  Questions answered.

## 2014-02-18 ENCOUNTER — Telehealth: Payer: Self-pay | Admitting: Family Medicine

## 2014-02-18 DIAGNOSIS — Z87440 Personal history of urinary (tract) infections: Secondary | ICD-10-CM | POA: Insufficient documentation

## 2014-02-18 DIAGNOSIS — R195 Other fecal abnormalities: Secondary | ICD-10-CM | POA: Insufficient documentation

## 2014-02-18 NOTE — Assessment & Plan Note (Signed)
Will obtain UA and urine culture at SNF.  Results to be faxed to office.  Recommend good fluid intake.  Can start daily probiotic.

## 2014-02-18 NOTE — Telephone Encounter (Signed)
Patients daughter is calling to request that he mothers last OV note be mailed to her for the nursing home,

## 2014-02-18 NOTE — Assessment & Plan Note (Signed)
Decrease Miralax to QOD dosing.  Will obtain stool culture and C. Diff assay.  Will also obtain CBC, CMP, H. Pylori.  We were unable to obtain labs in clinic due to patient becoming combative.  SNF agent given written orders to obtain samples and send results to our office.

## 2014-02-19 NOTE — Telephone Encounter (Signed)
Copy mailed to [place]

## 2014-02-21 ENCOUNTER — Telehealth: Payer: Self-pay | Admitting: Family Medicine

## 2014-02-21 DIAGNOSIS — M25551 Pain in right hip: Secondary | ICD-10-CM

## 2014-02-21 NOTE — Telephone Encounter (Signed)
Forwarded to Provider for further comment/SLS

## 2014-02-21 NOTE — Telephone Encounter (Signed)
Patient daughter called regarding this. Please call nursing home

## 2014-02-21 NOTE — Telephone Encounter (Signed)
Spoke with Chanda Busing nurse concerning patient's symptoms. R hip pain and difficulty with ambulation x 1 day.  Deny known fall.  Requesting x-ray.  Order granted and faxed to [facility].  They will fax over results.  Discussed patient may need to be evaluated by an onsite physician over the weekend as our office is closing for the week.

## 2014-02-21 NOTE — Telephone Encounter (Signed)
Not sufficient information.  Call nursing home to ask patient's nurse about complaints and any history of fall.  Highly unlikely patient called as she has dementia.   Also ask about lab results as nursing home has never sent over results requested at last visit.

## 2014-02-21 NOTE — Telephone Encounter (Signed)
Vaughan Basta from [facility] called. Please call her back at [phone]

## 2014-02-21 NOTE — Telephone Encounter (Signed)
Requesting xray of right hip, has pain

## 2014-02-24 ENCOUNTER — Telehealth: Payer: Self-pay | Admitting: Family Medicine

## 2014-02-24 NOTE — Telephone Encounter (Signed)
[facility] left a message to check on patients stool sample

## 2014-02-25 NOTE — Telephone Encounter (Signed)
I am confused by this.  Please call the home and get more specifics.  They have yet to send over the patient's lab results to our office, including her urine and stool cultures. I have called them multiple times.  They need to send this to our office STAT.

## 2014-02-26 ENCOUNTER — Ambulatory Visit (INDEPENDENT_AMBULATORY_CARE_PROVIDER_SITE_OTHER): Payer: Medicare Other | Admitting: Cardiology

## 2014-02-26 ENCOUNTER — Encounter: Payer: Self-pay | Admitting: Cardiology

## 2014-02-26 ENCOUNTER — Ambulatory Visit: Payer: Medicare Other | Admitting: Cardiology

## 2014-02-26 VITALS — BP 130/60 | HR 72 | Wt 93.1 lb

## 2014-02-26 DIAGNOSIS — E785 Hyperlipidemia, unspecified: Secondary | ICD-10-CM

## 2014-02-26 DIAGNOSIS — I4891 Unspecified atrial fibrillation: Secondary | ICD-10-CM

## 2014-02-26 DIAGNOSIS — Z9889 Other specified postprocedural states: Secondary | ICD-10-CM

## 2014-02-26 DIAGNOSIS — Z950 Presence of cardiac pacemaker: Secondary | ICD-10-CM

## 2014-02-26 DIAGNOSIS — I739 Peripheral vascular disease, unspecified: Secondary | ICD-10-CM

## 2014-02-26 DIAGNOSIS — I2510 Atherosclerotic heart disease of native coronary artery without angina pectoris: Secondary | ICD-10-CM

## 2014-02-26 DIAGNOSIS — I359 Nonrheumatic aortic valve disorder, unspecified: Secondary | ICD-10-CM

## 2014-02-26 DIAGNOSIS — I482 Chronic atrial fibrillation, unspecified: Secondary | ICD-10-CM

## 2014-02-26 NOTE — Patient Instructions (Signed)
Your physician recommends that you schedule a follow-up appointment in: 3 MONTHS WITH DR CRENSHAW  

## 2014-02-26 NOTE — Assessment & Plan Note (Signed)
Followed by electrophysiology. 

## 2014-02-26 NOTE — Assessment & Plan Note (Signed)
Continue statin. 

## 2014-02-26 NOTE — Progress Notes (Signed)
HPI: FU atrial fibrillation, prior pacemaker, coronary artery disease, aortic stenosis, mitral regurgitation s/p MV repair (apparently felt not to be a candidate for AVR at the time). Last Myoview was performed in May of 2010 and showed normal perfusion and an ejection fraction of 53%. Last echocardiogram was performed in October 2014. Normal LV function. Severe aortic stenosis with a mean gradient of 59 mm of mercury; mild aortic insufficiency. Biatrial enlargement. Moderate tricuspid regurgitation. Patient also with severe PVD followed by vasc surgery. Abdominal CT in June of 2015 showed a 4.1 x 3.7 cm abdominal aortic aneurysm. Common bile duct dilated raising concern for mass. Patient apparently admitted in June of 2015 with altered mental status. Possible new stroke. Head CT June 2015 showed chronic large right MCA infarct and small chronic left cerebellar lacunar infarct. Patient has failure to thrive and dementia. Patient resides in a nursing home. She is with an Environmental consultant today. No family members present. She denies dyspnea, chest pain, pedal edema or syncope.   Current Outpatient Prescriptions  Medication Sig Dispense Refill  . acetaminophen (TYLENOL) 500 MG tablet Take 1,000 mg by mouth 2 (two) times daily as needed (pain).      Marland Kitchen albuterol (PROVENTIL HFA;VENTOLIN HFA) 108 (90 BASE) MCG/ACT inhaler Inhale 2 puffs into the lungs every 6 (six) hours as needed for wheezing or shortness of breath.  1 Inhaler  3  . apixaban (ELIQUIS) 2.5 MG TABS tablet Take 1 tablet (2.5 mg total) by mouth 2 (two) times daily.  60 tablet    . diltiazem (CARDIZEM CD) 240 MG 24 hr capsule Take 240 mg by mouth daily.       . feeding supplement, ENSURE, (ENSURE) PUDG Take 1 Container by mouth daily.    0  . fluticasone (FLOVENT HFA) 44 MCG/ACT inhaler Inhale 2 puffs into the lungs daily.       Marland Kitchen guaifenesin (ROBITUSSIN) 100 MG/5ML syrup Take 100 mg by mouth every 4 (four) hours as needed for cough.      .  levothyroxine (SYNTHROID, LEVOTHROID) 75 MCG tablet Take 1 tablet (75 mcg total) by mouth daily.  90 tablet  3  . Melatonin 3 MG TABS Take 3 mg by mouth at bedtime.      . memantine (NAMENDA) 10 MG tablet Take 10 mg by mouth 2 (two) times daily.       . metoCLOPramide (REGLAN) 10 MG tablet Take 10 mg by mouth every 6 (six) hours as needed.      . polyethylene glycol (MIRALAX / GLYCOLAX) packet Take 17 g by mouth daily. Mix 17gm in 6-8 ounces of liquid daily.       . pravastatin (PRAVACHOL) 20 MG tablet Take 1 tablet (20 mg total) by mouth daily.  30 tablet  3  . ranitidine (ZANTAC) 300 MG tablet Take 1 tablet (300 mg total) by mouth at bedtime.  30 tablet  2  . Rivastigmine (EXELON) 13.3 MG/24HR PT24 Place 1 patch onto the skin daily.      Marland Kitchen SALINE NASAL SPRAY NA Place 1 spray into both nostrils 2 (two) times daily. 0.65% saline nasal spray      . tiotropium (SPIRIVA) 18 MCG inhalation capsule Place 18 mcg into inhaler and inhale daily.        . Vaginal Lubricant (REPLENS) GEL Place 1 application vaginally daily as needed (vaginal dryness).      . vitamin E 400 UNIT capsule Take 400 Units by mouth daily.        Marland Kitchen  Vitamins A & D (VITAMIN A & D) ointment Apply 1 application topically as needed for dry skin.       No current facility-administered medications for this visit.     Past Medical History  Diagnosis Date  . CAD (coronary artery disease)   . History of CVA (cerebrovascular accident)   . Atrial fibrillation     peristent  . Hyperlipemia   . COPD (chronic obstructive pulmonary disease)   . Epistaxis     recurrent  . Osteoporosis   . Hypothyroidism   . History of pulmonary embolism   . History of colonic diverticulitis   . History of breast cancer   . Asthma   . Iron deficiency anemia   . History of transfusion of whole blood   . LBBB (left bundle branch block)     history of  . Aortal stenosis     severe  . Pacemaker     bradycardia s/p pacemaker implant  . PAD  (peripheral artery disease)     left leg occluded viabahn stents x 3 left SFA  . Tachycardia-bradycardia syndrome     s/p PPM  . Cancer   . Arthritis   . Stroke   . HTN (hypertension) 09/25/2012  . HTN (hypertension) 09/25/2012  . Esophageal reflux 01/26/2014    Past Surgical History  Procedure Laterality Date  . Thyroidectomy      due to cancer  . Total hip arthroplasty    . Abdominal hysterectomy    . Mastectomy    . Mitral valve repair    . Intraocular lens insertion      left eye implant  . Pacemaker placement      for tachy/brady syndrome    History   Social History  . Marital Status: Widowed    Spouse Name: N/A    Number of Children: N/A  . Years of Education: N/A   Occupational History  . Not on file.   Social History Main Topics  . Smoking status: Former Research scientist (life sciences)  . Smokeless tobacco: Never Used     Comment: Quit >20 years  . Alcohol Use: No  . Drug Use: No  . Sexual Activity: Not on file   Other Topics Concern  . Not on file   Social History Narrative   Retired   Widow    1 daughter             ROS: no fevers or chills, productive cough, hemoptysis, dysphasia, odynophagia, melena, hematochezia, dysuria, hematuria, rash, seizure activity, orthopnea, PND, pedal edema, claudication. Remaining systems are negative.  Physical Exam: Well-developed extremely frail in no acute distress.  Skin is warm and dry.  HEENT is normal.  Neck is supple.  Chest is clear to auscultation with normal expansion.  Cardiovascular exam is regular rate and rhythm. 3/6 systolic murmur and 2/6 diastolic murmur left sternal border.   Abdominal exam nontender or distended. No masses palpated. Extremities show no edema. neuro Alert and oriented to person and place but not time.

## 2014-02-26 NOTE — Assessment & Plan Note (Signed)
Followed by vascular surgery. 

## 2014-02-26 NOTE — Assessment & Plan Note (Signed)
Continue statin. Not on aspirin given need for anticoagulant

## 2014-02-26 NOTE — Assessment & Plan Note (Signed)
Patient has severe aortic stenosis on previous echo and on examination. She continues to be asymptomatic. She does not want to pursue aortic valve replacement nor do I think she is a candidate based on frail body habitus, dementia and multiple medical problems. She understands this will progress and ultimately take her life.

## 2014-02-26 NOTE — Assessment & Plan Note (Signed)
History of mitral valve repair. Continue SBE prophylaxis. 

## 2014-02-26 NOTE — Telephone Encounter (Signed)
[facility] is faxing over this information. She states they have seen the stool sample and urine results. This no longer needs addressed unless there are any concern from provider.

## 2014-02-26 NOTE — Assessment & Plan Note (Signed)
Continue Cardizem. Patient is on apixaban which was initiated by primary care. This is not an ideal given valvular heart disease. However she is end-stage and I will not make changes at this point.

## 2014-02-27 NOTE — Telephone Encounter (Signed)
Patient daughter called in regarding this. She states that the nursing home has tried to reach [place] several times and need to know results???  Also, please call [name] with results at [phone] and it is okay to leave a detailed message.

## 2014-02-28 ENCOUNTER — Encounter: Payer: Self-pay | Admitting: Hematology & Oncology

## 2014-02-28 ENCOUNTER — Telehealth: Payer: Self-pay

## 2014-02-28 ENCOUNTER — Other Ambulatory Visit: Payer: Medicare Other | Admitting: Lab

## 2014-02-28 ENCOUNTER — Ambulatory Visit: Payer: Medicare Other | Admitting: Family Medicine

## 2014-02-28 ENCOUNTER — Ambulatory Visit (HOSPITAL_BASED_OUTPATIENT_CLINIC_OR_DEPARTMENT_OTHER): Payer: Medicare Other | Admitting: Family

## 2014-02-28 ENCOUNTER — Ambulatory Visit: Payer: Medicare Other | Admitting: Physician Assistant

## 2014-02-28 ENCOUNTER — Ambulatory Visit (HOSPITAL_BASED_OUTPATIENT_CLINIC_OR_DEPARTMENT_OTHER): Payer: Medicare Other

## 2014-02-28 VITALS — BP 149/72 | HR 72 | Temp 96.6°F | Resp 22 | Wt 91.0 lb

## 2014-02-28 DIAGNOSIS — R5381 Other malaise: Secondary | ICD-10-CM

## 2014-02-28 DIAGNOSIS — K5732 Diverticulitis of large intestine without perforation or abscess without bleeding: Secondary | ICD-10-CM | POA: Diagnosis not present

## 2014-02-28 DIAGNOSIS — R5383 Other fatigue: Secondary | ICD-10-CM | POA: Diagnosis not present

## 2014-02-28 DIAGNOSIS — D509 Iron deficiency anemia, unspecified: Secondary | ICD-10-CM

## 2014-02-28 DIAGNOSIS — E860 Dehydration: Secondary | ICD-10-CM | POA: Diagnosis not present

## 2014-02-28 MED ORDER — NITROFURANTOIN MONOHYD MACRO 100 MG PO CAPS: 100.0000 mg | ORAL_CAPSULE | Freq: Two times a day (BID) | ORAL | Status: DC

## 2014-02-28 MED ORDER — SODIUM CHLORIDE 0.9 % IV SOLN: INTRAVENOUS | Status: DC

## 2014-02-28 MED ORDER — SODIUM CHLORIDE 0.9 % IV SOLN
Freq: Once | INTRAVENOUS | Status: AC
  Administered 2014-02-28: 15:00:00 via INTRAVENOUS

## 2014-02-28 NOTE — Telephone Encounter (Signed)
Notified Vaughan Basta, nurse at [facility]. States we do not need to send Rx to [facility] as they have macrobid on hand. Gave verbal for macrobid twice a day for 7 days and faxed written order to above number.

## 2014-02-28 NOTE — Telephone Encounter (Signed)
Can you please call [facility] and ask them to fax these results to [place] immediately! We have all asked several times and my previous note states on 02-26-14 [name] is going to fax this but [name] states he has still not seen this. [name] has even called [facility] a couple of times requesting this information to be faxed. [name] asked Dr Charlett Blake if it got put in her box by accident and she states she hasn't seen any labs either.

## 2014-02-28 NOTE — Patient Instructions (Signed)
Dehydration, Adult Dehydration means your body does not have as much fluid as it needs. Your kidneys, brain, and heart will not work properly without the right amount of fluids and salt.  HOME CARE  Ask your doctor how to replace body fluid losses (rehydrate).  Drink enough fluids to keep your pee (urine) clear or pale yellow.  Drink small amounts of fluids often if you feel sick to your stomach (nauseous) or throw up (vomit).  Eat like you normally do.  Avoid:  Foods or drinks high in sugar.  Bubbly (carbonated) drinks.  Juice.  Very hot or cold fluids.  Drinks with caffeine.  Fatty, greasy foods.  Alcohol.  Tobacco.  Eating too much.  Gelatin desserts.  Wash your hands to avoid spreading germs (bacteria, viruses).  Only take medicine as told by your doctor.  Keep all doctor visits as told. GET HELP RIGHT AWAY IF:   You cannot drink something without throwing up.  You get worse even with treatment.  Your vomit has blood in it or looks greenish.  Your poop (stool) has blood in it or looks black and tarry.  You have not peed in 6 to 8 hours.  You pee a small amount of very dark pee.  You have a fever.  You pass out (faint).  You have belly (abdominal) pain that gets worse or stays in one spot (localizes).  You have a rash, stiff neck, or bad headache.  You get easily annoyed, sleepy, or are hard to wake up.  You feel weak, dizzy, or very thirsty. MAKE SURE YOU:   Understand these instructions.  Will watch your condition.  Will get help right away if you are not doing well or get worse. Document Released: 05/21/2009 Document Revised: 10/17/2011 Document Reviewed: 03/14/2011 [facility] Patient Information 2015 [place], [place]. This information is not intended to replace advice given to you by your health care provider. Make sure you discuss any questions you have with your health care provider. Dysphagia Swallowing problems (dysphagia) occur when  solids and liquids seem to stick in your throat on the way down to your stomach, or the food takes longer to get to the stomach. Other symptoms include regurgitating food, noises coming from the throat, chest discomfort with swallowing, and a feeling of fullness or the feeling of something being stuck in your throat when swallowing. When blockage in your throat is complete, it may be associated with drooling. CAUSES  Problems with swallowing may occur because of problems with the muscles. The food cannot be propelled in the usual manner into your stomach. You may have ulcers, scar tissue, or inflammation in the tube down which food travels from your mouth to your stomach (esophagus), which blocks food from passing normally into the stomach. Causes of inflammation include:  Acid reflux from your stomach into your esophagus.  Infection.  Radiation treatment for cancer.  Medicines taken without enough fluids to wash them down into your stomach. You may have nerve problems that prevent signals from being sent to the muscles of your esophagus to contract and move your food down to your stomach. Globus pharyngeus is a relatively common problem in which there is a sense of an obstruction or difficulty in swallowing, without any physical abnormalities of the swallowing passages being found. This problem usually improves over time with reassurance and testing to rule out other causes. DIAGNOSIS Dysphagia can be diagnosed and its cause can be determined by tests in which you swallow a white substance that  helps illuminate the inside of your throat (contrast medium) while X-rays are taken. Sometimes a flexible telescope that is inserted down your throat (endoscopy) to look at your esophagus and stomach is used. TREATMENT   If the dysphagia is caused by acid reflux or infection, medicines may be used.  If the dysphagia is caused by problems with your swallowing muscles, swallowing therapy may be used to help  you strengthen your swallowing muscles.  If the dysphagia is caused by a blockage or mass, procedures to remove the blockage may be done. HOME CARE INSTRUCTIONS  Try to eat soft food that is easier to swallow and check your weight on a daily basis to be sure that it is not decreasing.  Be sure to drink liquids when sitting upright (not lying down). SEEK MEDICAL CARE IF:  You are losing weight because you are unable to swallow.  You are coughing when you drink liquids (aspiration).  You are coughing up partially digested food. SEEK IMMEDIATE MEDICAL CARE IF:  You are unable to swallow your own saliva .  You are having shortness of breath or a fever, or both.  You have a hoarse voice along with difficulty swallowing. MAKE SURE YOU:  Understand these instructions.  Will watch your condition.  Will get help right away if you are not doing well or get worse. Document Released: 07/22/2000 Document Revised: 12/09/2013 Document Reviewed: 01/11/2013 [facility] Patient Information 2015 [place], [place]. This information is not intended to replace advice given to you by your health care provider. Make sure you discuss any questions you have with your health care provider.

## 2014-02-28 NOTE — Telephone Encounter (Signed)
Received results and gave to Elyn Aquas PA-C

## 2014-02-28 NOTE — Telephone Encounter (Signed)
Spoke to [place] at [facility]. She states that she will have [name] call us regarding this. I gave Sophia our fax # again and asked her to fax over labs.

## 2014-02-28 NOTE — Telephone Encounter (Signed)
fyi

## 2014-02-28 NOTE — Progress Notes (Signed)
[facility]  [phone] Fax:(336) [phone]  ID: Rita Miller OB: October 07, 1929 MR#: [id number] DIY#:[id number] Patient Care Team: Mosie Lukes, MD as PCP - General (Family Medicine)  DIAGNOSIS: 1. Recurrent iron-deficiency anemia.  2. History of stage I ductal carcinoma of the right breast.  3. Chronic left breast pain.   INTERVAL HISTORY: Rita Miller comes in for followup. She has advanced dementia and resides in a nursing home. Her daughter is here with her today and she is very unhappy with her mothers care at the nursing home. However, she states that she is unable to move her anywhere else because all other places are too far away from her and she visits everyday. She has no appetite and has not been eating good. The patient states that she is tired and appears to be dehydrated. We were unable to obtain labs from her today as a result. We are now giving her a 1/2 L of  NS. She has a non-productive cough. She denies headaches, dizziness, fever, chills, n/v, rash, SOB, chest pain, palpitations, constipations problems urinating, blood in urine or stool. The patient has frequent diarrhea. The daughter states that this is because the nursing home does not follow her strict diet for diverticulitis. She has stomach aches occassionally. She had a stroke 3 weeks ago and spent some time in the hospital. She has some right sided residual weakness from this and is getting physical therapy. In January, her iron studies showed a ferritin of 286 with an iron saturation of 20.  CURRENT TREATMENT: IV iron as indicated  REVIEW OF SYSTEMS: All other 10 point review of systems is negative except for those issues mentioned above.   PAST MEDICAL HISTORY: Past Medical History  Diagnosis Date  . CAD (coronary artery disease)   . History of CVA (cerebrovascular accident)   . Atrial fibrillation     peristent  . Hyperlipemia   . COPD (chronic obstructive pulmonary disease)   . Epistaxis      recurrent  . Osteoporosis   . Hypothyroidism   . History of pulmonary embolism   . History of colonic diverticulitis   . History of breast cancer   . Asthma   . Iron deficiency anemia   . History of transfusion of whole blood   . LBBB (left bundle branch block)     history of  . Aortal stenosis     severe  . Pacemaker     bradycardia s/p pacemaker implant  . PAD (peripheral artery disease)     left leg occluded viabahn stents x 3 left SFA  . Tachycardia-bradycardia syndrome     s/p PPM  . Cancer   . Arthritis   . Stroke   . HTN (hypertension) 09/25/2012  . HTN (hypertension) 09/25/2012  . Esophageal reflux 01/26/2014   PAST SURGICAL HISTORY: Past Surgical History  Procedure Laterality Date  . Thyroidectomy      due to cancer  . Total hip arthroplasty    . Abdominal hysterectomy    . Mastectomy    . Mitral valve repair    . Intraocular lens insertion      left eye implant  . Pacemaker placement      for tachy/brady syndrome   FAMILY HISTORY Family History  Problem Relation Age of Onset  . Breast cancer    . Colon cancer    . Diabetes    . Hyperlipidemia    . Hypertension    . Heart disease Mother  unknown heart disease   GYNECOLOGIC HISTORY:  No LMP recorded. Patient has had a hysterectomy.   SOCIAL HISTORY:  History   Social History  . Marital Status: Widowed    Spouse Name: N/A    Number of Children: N/A  . Years of Education: N/A   Occupational History  . Not on file.   Social History Main Topics  . Smoking status: Former Smoker    Quit date: 02/28/1994  . Smokeless tobacco: Never Used     Comment: Quit >20 years  . Alcohol Use: No  . Drug Use: No  . Sexual Activity: Not on file   Other Topics Concern  . Not on file   Social History Narrative   Retired   Widow    1 daughter            ADVANCED DIRECTIVES: <no information>  HEALTH MAINTENANCE: History  Substance Use Topics  . Smoking status: Former Smoker    Quit date:  02/28/1994  . Smokeless tobacco: Never Used     Comment: Quit >20 years  . Alcohol Use: No   Colonoscopy: PAP: Bone density: Lipid panel:  Allergies  Allergen Reactions  . Donepezil Hydrochloride     REACTION: heart fluttering  . Morphine Sulfate   . Oxycodone-Acetaminophen     REACTION: Hallucinations  . Percocet [Oxycodone-Acetaminophen]    Current Outpatient Prescriptions  Medication Sig Dispense Refill  . albuterol (PROVENTIL HFA;VENTOLIN HFA) 108 (90 BASE) MCG/ACT inhaler Inhale 2 puffs into the lungs every 6 (six) hours as needed for wheezing or shortness of breath.  1 Inhaler  3  . diltiazem (CARDIZEM CD) 240 MG 24 hr capsule Take 240 mg by mouth daily.       . feeding supplement, ENSURE, (ENSURE) PUDG Take 1 Container by mouth daily.    0  . fluticasone (FLOVENT HFA) 44 MCG/ACT inhaler Inhale 2 puffs into the lungs daily.       Marland Kitchen levothyroxine (SYNTHROID, LEVOTHROID) 75 MCG tablet Take 1 tablet (75 mcg total) by mouth daily.  90 tablet  3  . Melatonin 3 MG TABS Take 3 mg by mouth at bedtime.      . memantine (NAMENDA) 10 MG tablet Take 10 mg by mouth 2 (two) times daily.       . polyethylene glycol (MIRALAX / GLYCOLAX) packet Take 17 g by mouth daily. Mix 17gm in 6-8 ounces of liquid daily.       . pravastatin (PRAVACHOL) 20 MG tablet Take 1 tablet (20 mg total) by mouth daily.  30 tablet  3  . ranitidine (ZANTAC) 300 MG tablet Take 1 tablet (300 mg total) by mouth at bedtime.  30 tablet  2  . Rivastigmine (EXELON) 13.3 MG/24HR PT24 Place 1 patch onto the skin daily.      Marland Kitchen SALINE NASAL SPRAY NA Place 1 spray into both nostrils 2 (two) times daily. 0.65% saline nasal spray      . tiotropium (SPIRIVA) 18 MCG inhalation capsule Place 18 mcg into inhaler and inhale daily.        . vitamin E 400 UNIT capsule Take 400 Units by mouth daily.        Marland Kitchen acetaminophen (TYLENOL) 500 MG tablet Take 1,000 mg by mouth 2 (two) times daily as needed (pain).      Marland Kitchen apixaban (ELIQUIS) 2.5 MG  TABS tablet Take 1 tablet (2.5 mg total) by mouth 2 (two) times daily.  60 tablet    . guaifenesin (ROBITUSSIN)  100 MG/5ML syrup Take 100 mg by mouth every 4 (four) hours as needed for cough.      . metoCLOPramide (REGLAN) 10 MG tablet Take 10 mg by mouth every 6 (six) hours as needed.      . Vaginal Lubricant (REPLENS) GEL Place 1 application vaginally daily as needed (vaginal dryness).      . Vitamins A & D (VITAMIN A & D) ointment Apply 1 application topically as needed for dry skin.       Current Facility-Administered Medications  Medication Dose Route Frequency Provider Last Rate Last Dose  . 0.9 %  sodium chloride infusion   Intravenous Once Eliezer Bottom, NP       OBJECTIVE: Filed Vitals:   02/28/14 1219  BP: 149/72  Pulse: 72  Temp: 96.6 F (35.9 C)  Resp: 22   Body mass index is 18.37 kg/(m^2). ECOG FS:2 - Symptomatic, <50% confined to bed Ocular: Sclerae unicteric, pupils equal, round and reactive to light Ear-nose-throat: Oropharynx clear, dentition fair Lymphatic: No cervical or supraclavicular adenopathy Lungs no rales or rhonchi, good excursion bilaterally Heart regular rate and rhythm, no murmur appreciated Abd soft, nontender, positive bowel sounds MSK no focal spinal tenderness, no joint edema Neuro: non-focal, well-oriented, appropriate affect Breasts: Deferred  LAB RESULTS: CMP     Component Value Date/Time   NA 135 01/17/2014 1352   K 4.3 01/17/2014 1352   CL 100 01/17/2014 1352   CO2 25 01/17/2014 1352   GLUCOSE 77 01/17/2014 1352   BUN 16 01/17/2014 1352   CREATININE 1.00 01/17/2014 1352   CREATININE 0.88 01/07/2014 1630   CALCIUM 8.9 01/17/2014 1352   PROT 8.2 01/07/2014 1218   ALBUMIN 3.5 01/17/2014 1352   AST 26 01/07/2014 1218   ALT 9 01/07/2014 1218   ALKPHOS 75 01/07/2014 1218   BILITOT 0.7 01/07/2014 1218   GFRNONAA 60* 01/07/2014 1630   GFRNONAA 49* 11/04/2010 1120   GFRAA 70* 01/07/2014 1630   GFRAA 59* 11/04/2010 1120   No results found for this  basename: SPEP, UPEP,  kappa and lambda light chains   Lab Results  Component Value Date   WBC 6.4 01/17/2014   NEUTROABS 3.6 01/07/2014   HGB 11.2* 01/17/2014   HCT 32.9* 01/17/2014   MCV 92.4 01/17/2014   PLT 313 01/17/2014   No results found for this basename: LABCA2   No components found with this basename: [id number]   No results found for this basename: INR,  in the last 168 hours  STUDIES: No results found.  ASSESSMENT/PLAN: Rita Miller is a nice 80 year old white female with iron deficiency anemia. She is very tired today and dehydrated.  As stated above we were unable to obtain labs from her.  She is getting 1/2 L of NS for dehydration.  We will hold off on iron for now since we have no lab findings to go by.  We will see her back in 2 months for labs and follow-up.  I spoke with the daughter and she is in agreement with the plan. She knows to call with any questions or concerns and to go to the ED in the event of an emergency. We can certainly see the patient sooner if need be.   Eliezer Bottom, NP 02/28/2014 1:56 PM

## 2014-02-28 NOTE — Telephone Encounter (Signed)
pts urine culture results back on MD's desk  I don't see where pt is allergic to any antibiotics?  Please advise?

## 2014-03-03 ENCOUNTER — Encounter: Payer: Self-pay | Admitting: Family Medicine

## 2014-03-03 ENCOUNTER — Inpatient Hospital Stay (HOSPITAL_COMMUNITY): Payer: Medicare Other

## 2014-03-03 ENCOUNTER — Other Ambulatory Visit: Payer: Self-pay

## 2014-03-03 ENCOUNTER — Inpatient Hospital Stay (HOSPITAL_BASED_OUTPATIENT_CLINIC_OR_DEPARTMENT_OTHER)
Admission: EM | Admit: 2014-03-03 | Discharge: 2014-03-07 | DRG: 064 | Disposition: A | Discharge status: Skilled Nursing Facility | Payer: Medicare Other | Attending: Internal Medicine | Admitting: Internal Medicine

## 2014-03-03 ENCOUNTER — Encounter (HOSPITAL_BASED_OUTPATIENT_CLINIC_OR_DEPARTMENT_OTHER): Payer: Self-pay | Admitting: Emergency Medicine

## 2014-03-03 ENCOUNTER — Ambulatory Visit (INDEPENDENT_AMBULATORY_CARE_PROVIDER_SITE_OTHER): Payer: Medicare Other | Admitting: Family Medicine

## 2014-03-03 ENCOUNTER — Emergency Department (HOSPITAL_BASED_OUTPATIENT_CLINIC_OR_DEPARTMENT_OTHER): Payer: Medicare Other

## 2014-03-03 VITALS — BP 118/60 | HR 60 | Temp 97.8°F | Ht 59.0 in

## 2014-03-03 DIAGNOSIS — E43 Unspecified severe protein-calorie malnutrition: Secondary | ICD-10-CM | POA: Diagnosis present

## 2014-03-03 DIAGNOSIS — J4489 Other specified chronic obstructive pulmonary disease: Secondary | ICD-10-CM | POA: Diagnosis present

## 2014-03-03 DIAGNOSIS — I428 Other cardiomyopathies: Secondary | ICD-10-CM | POA: Diagnosis present

## 2014-03-03 DIAGNOSIS — J441 Chronic obstructive pulmonary disease with (acute) exacerbation: Secondary | ICD-10-CM

## 2014-03-03 DIAGNOSIS — G8190 Hemiplegia, unspecified affecting unspecified side: Secondary | ICD-10-CM | POA: Diagnosis present

## 2014-03-03 DIAGNOSIS — I10 Essential (primary) hypertension: Secondary | ICD-10-CM | POA: Diagnosis present

## 2014-03-03 DIAGNOSIS — Z87440 Personal history of urinary (tract) infections: Secondary | ICD-10-CM | POA: Diagnosis present

## 2014-03-03 DIAGNOSIS — Z515 Encounter for palliative care: Secondary | ICD-10-CM | POA: Diagnosis not present

## 2014-03-03 DIAGNOSIS — Z87891 Personal history of nicotine dependence: Secondary | ICD-10-CM

## 2014-03-03 DIAGNOSIS — Z885 Allergy status to narcotic agent status: Secondary | ICD-10-CM | POA: Diagnosis not present

## 2014-03-03 DIAGNOSIS — R64 Cachexia: Secondary | ICD-10-CM | POA: Diagnosis present

## 2014-03-03 DIAGNOSIS — R5381 Other malaise: Secondary | ICD-10-CM | POA: Diagnosis not present

## 2014-03-03 DIAGNOSIS — I2510 Atherosclerotic heart disease of native coronary artery without angina pectoris: Secondary | ICD-10-CM

## 2014-03-03 DIAGNOSIS — M810 Age-related osteoporosis without current pathological fracture: Secondary | ICD-10-CM

## 2014-03-03 DIAGNOSIS — G4700 Insomnia, unspecified: Secondary | ICD-10-CM

## 2014-03-03 DIAGNOSIS — J449 Chronic obstructive pulmonary disease, unspecified: Secondary | ICD-10-CM | POA: Diagnosis present

## 2014-03-03 DIAGNOSIS — Z86711 Personal history of pulmonary embolism: Secondary | ICD-10-CM | POA: Diagnosis present

## 2014-03-03 DIAGNOSIS — N390 Urinary tract infection, site not specified: Secondary | ICD-10-CM | POA: Diagnosis present

## 2014-03-03 DIAGNOSIS — E039 Hypothyroidism, unspecified: Secondary | ICD-10-CM | POA: Diagnosis present

## 2014-03-03 DIAGNOSIS — I2789 Other specified pulmonary heart diseases: Secondary | ICD-10-CM | POA: Diagnosis present

## 2014-03-03 DIAGNOSIS — I739 Peripheral vascular disease, unspecified: Secondary | ICD-10-CM | POA: Diagnosis present

## 2014-03-03 DIAGNOSIS — N993 Prolapse of vaginal vault after hysterectomy: Secondary | ICD-10-CM

## 2014-03-03 DIAGNOSIS — Z853 Personal history of malignant neoplasm of breast: Secondary | ICD-10-CM

## 2014-03-03 DIAGNOSIS — R414 Neurologic neglect syndrome: Secondary | ICD-10-CM | POA: Insufficient documentation

## 2014-03-03 DIAGNOSIS — R195 Other fecal abnormalities: Secondary | ICD-10-CM | POA: Diagnosis present

## 2014-03-03 DIAGNOSIS — I482 Chronic atrial fibrillation, unspecified: Secondary | ICD-10-CM

## 2014-03-03 DIAGNOSIS — D509 Iron deficiency anemia, unspecified: Secondary | ICD-10-CM | POA: Diagnosis present

## 2014-03-03 DIAGNOSIS — I4891 Unspecified atrial fibrillation: Secondary | ICD-10-CM | POA: Diagnosis present

## 2014-03-03 DIAGNOSIS — I422 Other hypertrophic cardiomyopathy: Secondary | ICD-10-CM

## 2014-03-03 DIAGNOSIS — Z96649 Presence of unspecified artificial hip joint: Secondary | ICD-10-CM | POA: Diagnosis not present

## 2014-03-03 DIAGNOSIS — Z8719 Personal history of other diseases of the digestive system: Secondary | ICD-10-CM

## 2014-03-03 DIAGNOSIS — Z888 Allergy status to other drugs, medicaments and biological substances status: Secondary | ICD-10-CM | POA: Diagnosis not present

## 2014-03-03 DIAGNOSIS — I6350 Cerebral infarction due to unspecified occlusion or stenosis of unspecified cerebral artery: Secondary | ICD-10-CM

## 2014-03-03 DIAGNOSIS — I639 Cerebral infarction, unspecified: Secondary | ICD-10-CM | POA: Diagnosis present

## 2014-03-03 DIAGNOSIS — I359 Nonrheumatic aortic valve disorder, unspecified: Secondary | ICD-10-CM | POA: Diagnosis present

## 2014-03-03 DIAGNOSIS — Z66 Do not resuscitate: Secondary | ICD-10-CM | POA: Diagnosis present

## 2014-03-03 DIAGNOSIS — Z950 Presence of cardiac pacemaker: Secondary | ICD-10-CM

## 2014-03-03 DIAGNOSIS — E785 Hyperlipidemia, unspecified: Secondary | ICD-10-CM | POA: Diagnosis present

## 2014-03-03 DIAGNOSIS — R627 Adult failure to thrive: Secondary | ICD-10-CM | POA: Diagnosis present

## 2014-03-03 DIAGNOSIS — R5383 Other fatigue: Secondary | ICD-10-CM | POA: Diagnosis present

## 2014-03-03 DIAGNOSIS — Z9889 Other specified postprocedural states: Secondary | ICD-10-CM

## 2014-03-03 DIAGNOSIS — I6340 Cerebral infarction due to embolism of unspecified cerebral artery: Principal | ICD-10-CM | POA: Diagnosis present

## 2014-03-03 DIAGNOSIS — Z8673 Personal history of transient ischemic attack (TIA), and cerebral infarction without residual deficits: Secondary | ICD-10-CM

## 2014-03-03 DIAGNOSIS — F0390 Unspecified dementia without behavioral disturbance: Secondary | ICD-10-CM | POA: Diagnosis present

## 2014-03-03 DIAGNOSIS — Z681 Body mass index (BMI) 19 or less, adult: Secondary | ICD-10-CM | POA: Diagnosis not present

## 2014-03-03 DIAGNOSIS — R531 Weakness: Secondary | ICD-10-CM

## 2014-03-03 DIAGNOSIS — I350 Nonrheumatic aortic (valve) stenosis: Secondary | ICD-10-CM | POA: Diagnosis present

## 2014-03-03 DIAGNOSIS — I272 Pulmonary hypertension, unspecified: Secondary | ICD-10-CM

## 2014-03-03 DIAGNOSIS — F329 Major depressive disorder, single episode, unspecified: Secondary | ICD-10-CM

## 2014-03-03 DIAGNOSIS — F3289 Other specified depressive episodes: Secondary | ICD-10-CM

## 2014-03-03 DIAGNOSIS — Z8541 Personal history of malignant neoplasm of cervix uteri: Secondary | ICD-10-CM

## 2014-03-03 DIAGNOSIS — Z7901 Long term (current) use of anticoagulants: Secondary | ICD-10-CM

## 2014-03-03 HISTORY — DX: Neurologic neglect syndrome: R41.4

## 2014-03-03 LAB — DIFFERENTIAL
BASOS ABS: 0 10*3/uL (ref 0.0–0.1)
Basophils Relative: 0 % (ref 0–1)
EOS PCT: 4 % (ref 0–5)
Eosinophils Absolute: 0.4 10*3/uL (ref 0.0–0.7)
LYMPHS PCT: 6 % — AB (ref 12–46)
Lymphs Abs: 0.6 10*3/uL — ABNORMAL LOW (ref 0.7–4.0)
Monocytes Absolute: 0.3 10*3/uL (ref 0.1–1.0)
Monocytes Relative: 3 % (ref 3–12)
NEUTROS ABS: 8 10*3/uL — AB (ref 1.7–7.7)
Neutrophils Relative %: 87 % — ABNORMAL HIGH (ref 43–77)

## 2014-03-03 LAB — COMPREHENSIVE METABOLIC PANEL
ALT: 7 U/L (ref 0–35)
AST: 24 U/L (ref 0–37)
Albumin: 2.9 g/dL — ABNORMAL LOW (ref 3.5–5.2)
Alkaline Phosphatase: 74 U/L (ref 39–117)
Anion gap: 13 (ref 5–15)
BUN: 27 mg/dL — ABNORMAL HIGH (ref 6–23)
CALCIUM: 9 mg/dL (ref 8.4–10.5)
CO2: 23 meq/L (ref 19–32)
Chloride: 101 mEq/L (ref 96–112)
Creatinine, Ser: 1.1 mg/dL (ref 0.50–1.10)
GFR calc Af Amer: 53 mL/min — ABNORMAL LOW (ref >90)
GFR, EST NON AFRICAN AMERICAN: 46 mL/min — AB (ref >90)
Glucose, Bld: 176 mg/dL — ABNORMAL HIGH (ref 70–99)
Potassium: 3.9 mEq/L (ref 3.7–5.3)
Sodium: 137 mEq/L (ref 137–147)
Total Bilirubin: 0.8 mg/dL (ref 0.3–1.2)
Total Protein: 7.6 g/dL (ref 6.0–8.3)

## 2014-03-03 LAB — ETHANOL: Alcohol, Ethyl (B): <11 mg/dL (ref 0–11)

## 2014-03-03 LAB — CBC
HCT: 37.6 % (ref 36.0–46.0)
Hemoglobin: 12.2 g/dL (ref 12.0–15.0)
MCH: 31.9 pg (ref 26.0–34.0)
MCHC: 32.4 g/dL (ref 30.0–36.0)
MCV: 98.4 fL (ref 78.0–100.0)
PLATELETS: 154 10*3/uL (ref 150–400)
RBC: 3.82 MIL/uL — ABNORMAL LOW (ref 3.87–5.11)
RDW: 14.6 % (ref 11.5–15.5)
WBC: 9.2 10*3/uL (ref 4.0–10.5)

## 2014-03-03 LAB — PROTIME-INR
INR: 1.22 (ref 0.00–1.49)
PROTHROMBIN TIME: 15.4 s — AB (ref 11.6–15.2)

## 2014-03-03 LAB — MRSA PCR SCREENING: MRSA BY PCR: NEGATIVE

## 2014-03-03 LAB — APTT: APTT: 37 s (ref 24–37)

## 2014-03-03 LAB — TROPONIN I: Troponin I: <0.3 ng/mL (ref <0.30)

## 2014-03-03 MED ORDER — STROKE: EARLY STAGES OF RECOVERY BOOK
Freq: Once | Status: AC
  Administered 2014-03-03: 1
  Filled 2014-03-03: qty 1

## 2014-03-03 MED ORDER — SIMVASTATIN 20 MG PO TABS: 20.0000 mg | ORAL_TABLET | Freq: Every day | ORAL | Status: DC

## 2014-03-03 MED ORDER — FAMOTIDINE 20 MG PO TABS
20.0000 mg | ORAL_TABLET | Freq: Every day | ORAL | Status: DC
  Filled 2014-03-03 (×2): qty 1

## 2014-03-03 MED ORDER — NITROFURANTOIN MONOHYD MACRO 100 MG PO CAPS: 100.0000 mg | ORAL_CAPSULE | Freq: Two times a day (BID) | ORAL | Status: DC

## 2014-03-03 MED ORDER — PRAVASTATIN SODIUM 40 MG PO TABS
40.0000 mg | ORAL_TABLET | Freq: Every day | ORAL | Status: DC
  Filled 2014-03-03: qty 1

## 2014-03-03 MED ORDER — CEPHALEXIN 500 MG PO CAPS
500.0000 mg | ORAL_CAPSULE | Freq: Two times a day (BID) | ORAL | Status: DC
  Filled 2014-03-03 (×3): qty 1

## 2014-03-03 MED ORDER — FLUTICASONE PROPIONATE HFA 44 MCG/ACT IN AERO
2.0000 | INHALATION_SPRAY | Freq: Every day | RESPIRATORY_TRACT | Status: DC
  Administered 2014-03-05 – 2014-03-07 (×3): 2 via RESPIRATORY_TRACT
  Filled 2014-03-03: qty 10.6

## 2014-03-03 MED ORDER — ACETAMINOPHEN 500 MG PO TABS: 1000.0000 mg | ORAL_TABLET | Freq: Two times a day (BID) | ORAL | Status: DC | PRN

## 2014-03-03 MED ORDER — APIXABAN 2.5 MG PO TABS
2.5000 mg | ORAL_TABLET | Freq: Two times a day (BID) | ORAL | Status: DC
  Filled 2014-03-03: qty 1

## 2014-03-03 MED ORDER — GUAIFENESIN 100 MG/5ML PO SYRP
100.0000 mg | ORAL_SOLUTION | ORAL | Status: DC | PRN
  Filled 2014-03-03: qty 5

## 2014-03-03 MED ORDER — LEVOTHYROXINE SODIUM 75 MCG PO TABS
75.0000 ug | ORAL_TABLET | Freq: Every day | ORAL | Status: DC
  Filled 2014-03-03 (×2): qty 1

## 2014-03-03 MED ORDER — MELATONIN 3 MG PO TABS: 3.0000 mg | ORAL_TABLET | Freq: Every day | ORAL | Status: DC

## 2014-03-03 MED ORDER — MEMANTINE HCL 10 MG PO TABS
10.0000 mg | ORAL_TABLET | Freq: Two times a day (BID) | ORAL | Status: DC
  Filled 2014-03-03 (×3): qty 1

## 2014-03-03 MED ORDER — RIVASTIGMINE 4.6 MG/24HR TD PT24
13.3000 mg | MEDICATED_PATCH | Freq: Every day | TRANSDERMAL | Status: DC
  Filled 2014-03-03: qty 3

## 2014-03-03 MED ORDER — RIVASTIGMINE 13.3 MG/24HR TD PT24: 1.0000 | MEDICATED_PATCH | Freq: Every day | TRANSDERMAL | Status: DC

## 2014-03-03 MED ORDER — POLYETHYLENE GLYCOL 3350 17 G PO PACK
17.0000 g | PACK | Freq: Every day | ORAL | Status: DC | PRN
  Filled 2014-03-03: qty 1

## 2014-03-03 MED ORDER — DILTIAZEM HCL ER COATED BEADS 240 MG PO CP24
240.0000 mg | ORAL_CAPSULE | Freq: Every day | ORAL | Status: DC
  Filled 2014-03-03: qty 1

## 2014-03-03 MED ORDER — TIOTROPIUM BROMIDE MONOHYDRATE 18 MCG IN CAPS
18.0000 ug | ORAL_CAPSULE | Freq: Every day | RESPIRATORY_TRACT | Status: DC
  Administered 2014-03-05 – 2014-03-07 (×3): 18 ug via RESPIRATORY_TRACT
  Filled 2014-03-03: qty 5

## 2014-03-03 MED ORDER — ALBUTEROL SULFATE (2.5 MG/3ML) 0.083% IN NEBU: 3.0000 mL | INHALATION_SOLUTION | Freq: Four times a day (QID) | RESPIRATORY_TRACT | Status: DC | PRN

## 2014-03-03 NOTE — Assessment & Plan Note (Signed)
New onset full left sided neglect some time over night or this morning. Spoke with facility this am and an aide that worked all weekend confirmed she appeared in her normal state of health yesterday but woke up today with deficits. Will refer to ED for further work up

## 2014-03-03 NOTE — Assessment & Plan Note (Signed)
Right ward gaze unable to track and left visual field neglect noted

## 2014-03-03 NOTE — Progress Notes (Signed)
PHARMACIST - PHYSICIAN ORDER COMMUNICATION  CONCERNING: P&T Medication Policy on Herbal Medications  DESCRIPTION:  This patient's order for:  melatonin  has been noted.  This product(s) is classified as an "herbal" or natural product. Due to a lack of definitive safety studies or FDA approval, nonstandard manufacturing practices, plus the potential risk of unknown drug-drug interactions while on inpatient medications, the Pharmacy and Therapeutics Committee does not permit the use of "herbal" or natural products of this type within [facility].   ACTION TAKEN: The pharmacy department is unable to verify this order at this time. Please reevaluate patient's clinical condition at discharge and address if the herbal or natural product(s) should be resumed at that time.   

## 2014-03-03 NOTE — ED Notes (Signed)
Caretaker reports that last seen normal was Friday for patient. Pt has complete left sided weakness.

## 2014-03-03 NOTE — Assessment & Plan Note (Signed)
Urine culture from [facility] confirmed recent UTI is on Macrobid.

## 2014-03-03 NOTE — ED Notes (Signed)
Pt brought in for eval from PMD office for ? Stroke, last seen normal Friday PMby caregiver.  Caregiver reports leaning to left side, left side weakness.

## 2014-03-03 NOTE — Progress Notes (Addendum)
MEDICATION RELATED CONSULT NOTE - INITIAL   Allergies  Allergen Reactions  . Donepezil Hydrochloride     REACTION: heart fluttering  . Morphine Sulfate   . Oxycodone-Acetaminophen     REACTION: Hallucinations  . Percocet [Oxycodone-Acetaminophen]    Estimated Creatinine Clearance: 25.9 ml/min (by C-G formula based on Cr of 1.1).  80 y/o on macrobid PTA for UTI. Macrobid contraindicated for CrCl <60 mL/min. After discussion with Dr. Broadus John, changed to Keflex 500 mg q12h.    ALSO FYI: Patient's MAR received from nursing home. Patient has NOT been on Apixaban since 6/26, confirmed with nursing home that it was [place] on 6/26 by the patients PCP Penni Homans)  Narda Bonds 03/03/2014,11:03 PM

## 2014-03-03 NOTE — ED Notes (Signed)
Report given to Research scientist (physical sciences) at [facility]

## 2014-03-03 NOTE — H&P (Addendum)
[facility] History and Physical  Rita Miller ZOX:[id number] DOB: 1930-05-06 DOA: 03/03/2014  Referring physician: EDP PCP: Penni Homans, MD   Chief Complaint: L hemiparesis  HPI: Rita Miller is a 80 y.o. female with PMH of Right MCA CVA in 6/15, Afib on apixaban, Dementia, Aortic stenosis, CAD, COPD, tachy-brady syndrome s/p Pacemaker, h/o breast CA, resident of SNF was sent to [facility] ER by her PCP today to evaluate her L sided weakness. Reportedly per her Aides at SNF she has been unable to use her L side today, unlike yesterday, she was taken to see Dr.Blyth who then referred her to [facility]. [place] [facility], Workup, namely Ct noted Acute or subacute extension of the right middle cerebral artery distribution stroke and upon discussion with TRH and Neurology she was accepted to SDU at [facility]. Pt unable to give me any reliable information. Of note, she was seen by Palliative care last month and made DNR and recommendations made for Palliative care FU at SNF.  Review of Systems: unable to assess due to confusion Constitutional:  No weight loss, night sweats, Fevers, chills, fatigue.  HEENT:  No headaches, Difficulty swallowing,Tooth/dental problems,Sore throat,  No sneezing, itching, ear ache, nasal congestion, post nasal drip,  Cardio-vascular:  No chest pain, Orthopnea, PND, swelling in lower extremities, anasarca, dizziness, palpitations  GI:  No heartburn, indigestion, abdominal pain, nausea, vomiting, diarrhea, change in bowel habits, loss of appetite  Resp:  No shortness of breath with exertion or at rest. No excess mucus, no productive cough, No non-productive cough, No coughing up of blood.No change in color of mucus.No wheezing.No chest wall deformity  Skin:  no rash or lesions.  GU:  no dysuria, change in color of urine, no urgency or frequency. No flank pain.  Musculoskeletal:  No joint pain or swelling. No decreased range of motion. No back pain.    Psych:  No change in mood or affect. No depression or anxiety. No memory loss.   Past Medical History  Diagnosis Date  . CAD (coronary artery disease)   . History of CVA (cerebrovascular accident)   . Atrial fibrillation     peristent  . Hyperlipemia   . COPD (chronic obstructive pulmonary disease)   . Epistaxis     recurrent  . Osteoporosis   . Hypothyroidism   . History of pulmonary embolism   . History of colonic diverticulitis   . History of breast cancer   . Asthma   . Iron deficiency anemia   . History of transfusion of whole blood   . LBBB (left bundle branch block)     history of  . Aortal stenosis     severe  . Pacemaker     bradycardia s/p pacemaker implant  . PAD (peripheral artery disease)     left leg occluded viabahn stents x 3 left SFA  . Tachycardia-bradycardia syndrome     s/p PPM  . Cancer   . Arthritis   . Stroke   . HTN (hypertension) 09/25/2012  . HTN (hypertension) 09/25/2012  . Esophageal reflux 01/26/2014  . Left-sided visual neglect 03/03/2014   Past Surgical History  Procedure Laterality Date  . Thyroidectomy      due to cancer  . Total hip arthroplasty    . Abdominal hysterectomy    . Mastectomy    . Mitral valve repair    . Intraocular lens insertion      left eye implant  . Pacemaker placement  for tachy/brady syndrome   Social History:  reports that she quit smoking about 20 years ago. She has never used smokeless tobacco. She reports that she does not drink alcohol or use illicit drugs.  Allergies  Allergen Reactions  . Donepezil Hydrochloride     REACTION: heart fluttering  . Morphine Sulfate   . Oxycodone-Acetaminophen     REACTION: Hallucinations  . Percocet [Oxycodone-Acetaminophen]     Family History  Problem Relation Age of Onset  . Breast cancer    . Colon cancer    . Diabetes    . Hyperlipidemia    . Hypertension    . Heart disease Mother     unknown heart disease     Prior to Admission medications    Medication Sig Start Date End Date Taking? Authorizing Provider  acetaminophen (TYLENOL) 500 MG tablet Take 1,000 mg by mouth 2 (two) times daily as needed (pain).    Historical Provider, MD  albuterol (PROVENTIL HFA;VENTOLIN HFA) 108 (90 BASE) MCG/ACT inhaler Inhale 2 puffs into the lungs every 6 (six) hours as needed for wheezing or shortness of breath. 12/31/13   Mosie Lukes, MD  apixaban (ELIQUIS) 2.5 MG TABS tablet Take 1 tablet (2.5 mg total) by mouth 2 (two) times daily. 01/09/14   Thurnell Lose, MD  diltiazem (CARDIZEM CD) 240 MG 24 hr capsule Take 240 mg by mouth daily.     Historical Provider, MD  feeding supplement, ENSURE, (ENSURE) PUDG Take 1 Container by mouth daily. 01/09/14   Thurnell Lose, MD  fluticasone (FLOVENT HFA) 44 MCG/ACT inhaler Inhale 2 puffs into the lungs daily.     Historical Provider, MD  guaifenesin (ROBITUSSIN) 100 MG/5ML syrup Take 100 mg by mouth every 4 (four) hours as needed for cough.    Historical Provider, MD  levothyroxine (SYNTHROID, LEVOTHROID) 75 MCG tablet Take 1 tablet (75 mcg total) by mouth daily. 08/02/13   Mosie Lukes, MD  Melatonin 3 MG TABS Take 3 mg by mouth at bedtime.    Historical Provider, MD  memantine (NAMENDA) 10 MG tablet Take 10 mg by mouth 2 (two) times daily.     Historical Provider, MD  nitrofurantoin, macrocrystal-monohydrate, (MACROBID) 100 MG capsule Take 1 capsule (100 mg total) by mouth 2 (two) times daily. 02/28/14   Mosie Lukes, MD  polyethylene glycol (MIRALAX / GLYCOLAX) packet Take 17 g by mouth daily. Mix 17gm in 6-8 ounces of liquid daily.     Historical Provider, MD  pravastatin (PRAVACHOL) 20 MG tablet Take 1 tablet (20 mg total) by mouth daily. 11/26/13   Mosie Lukes, MD  ranitidine (ZANTAC) 300 MG tablet Take 1 tablet (300 mg total) by mouth at bedtime. 01/17/14   Mosie Lukes, MD  Rivastigmine (EXELON) 13.3 MG/24HR PT24 Place 1 patch onto the skin daily.    Historical Provider, MD  SALINE NASAL SPRAY NA  Place 1 spray into both nostrils 2 (two) times daily. 0.65% saline nasal spray    Historical Provider, MD  tiotropium (SPIRIVA) 18 MCG inhalation capsule Place 18 mcg into inhaler and inhale daily.      Historical Provider, MD  Vaginal Lubricant (REPLENS) GEL Place 1 application vaginally daily as needed (vaginal dryness).    Historical Provider, MD  vitamin E 400 UNIT capsule Take 400 Units by mouth daily.      Historical Provider, MD  Vitamins A & D (VITAMIN A & D) ointment Apply 1 application topically as needed for dry  skin.    Historical Provider, MD   Physical Exam: Filed Vitals:   03/03/14 1402 03/03/14 1600 03/03/14 1626 03/03/14 1920  BP: 148/57  151/75 144/66  Pulse: 86 72 90 94  Temp: 97.6 F (36.4 C)   97.9 F (36.6 C)  TempSrc: Oral   Oral  Resp: 16   27  Height:    5' (1.524 m)  Weight:    40.914 kg (90 lb 3.2 oz)  SpO2:  94% 94% 96%    Wt Readings from Last 3 Encounters:  03/03/14 40.914 kg (90 lb 3.2 oz)  02/28/14 41.277 kg (91 lb)  02/26/14 42.221 kg (93 lb 1.3 oz)    General:  Frail, elderly, confused, no distress Eyes: PERRL, normal lids, irises & conjunctiva ENT: grossly normal  lips & tongue Neck: no LAD, masses or thyromegaly Cardiovascular: Irregular rate and rhythm, systolic murmur noted. No LE edema. Respiratory: CTA bilaterally, no w/r/r. Normal respiratory effort. Abdomen: soft, ntnd Skin: no rash or induration seen on limited exam Musculoskeletal: no edema c/c Psychiatric: confused Neurologic: confused, cranial nerves 2-12 grossly intact LUE flaccid, LLE 2/5 but very poor effort, rest 4/5 Plantars mute, DTR 1plus          Labs on Admission:  Basic Metabolic Panel:  Recent Labs Lab 03/03/14 1420  NA 137  K 3.9  CL 101  CO2 23  GLUCOSE 176*  BUN 27*  CREATININE 1.10  CALCIUM 9.0   Liver Function Tests:  Recent Labs Lab 03/03/14 1420  AST 24  ALT 7  ALKPHOS 74  BILITOT 0.8  PROT 7.6  ALBUMIN 2.9*   No results found for  this basename: LIPASE, AMYLASE,  in the last 168 hours No results found for this basename: AMMONIA,  in the last 168 hours CBC:  Recent Labs Lab 03/03/14 1420  WBC 9.2  NEUTROABS 8.0*  HGB 12.2  HCT 37.6  MCV 98.4  PLT 154   Cardiac Enzymes:  Recent Labs Lab 03/03/14 1420  TROPONINI <0.30    BNP (last 3 results) No results found for this basename: PROBNP,  in the last 8760 hours CBG: No results found for this basename: GLUCAP,  in the last 168 hours  Radiological Exams on Admission: Ct Head Wo Contrast  03/03/2014   CLINICAL DATA:  Acute mental status changes. Prior history of stroke.  EXAM: CT HEAD WITHOUT CONTRAST  TECHNIQUE: Contiguous axial images were obtained from the base of the skull through the vertex without intravenous contrast.  COMPARISON:  01/07/2014, 01/06/2014, 11/07/1009.  FINDINGS: Remote large right middle cerebral artery distribution stroke with encephalomalacia in the right temporal lobe, right frontal lobe and right parietal lobe. There is evidence of extension of the stroke to involve more of the right frontal cortex and the right basal ganglia since the most recent examination 01/07/2014. No associated hemorrhage or hematoma. Mild mass effect, with effacement of cortical sulci in the right frontal lobe and minimal shift of the midline to the left approximating 3 mm. No extra-axial fluid collections. Moderate to severe cortical and deep atrophy and moderate to severe cerebellar atrophy, unchanged. Severe changes of small vessel disease of the white matter diffusely, unchanged.  No skull fracture or other focal osseous abnormality involving the skull. Visualized paranasal sinuses, bilateral mastoid air cells and bilateral middle ear cavities well-aerated. Extensive bilateral carotid siphon and vertebral artery atherosclerosis.  IMPRESSION: 1. Acute or subacute extension of the right middle cerebral artery distribution stroke in the right frontal lobe and  basal  ganglia, new since the 01/07/2014 examination, without associated hemorrhage or hematoma. 2. Minimal mass effect with effacement of cortical sulci in the right frontal lobe and slight midline shift to the left approximating 3 mm. 3. Otherwise, stable encephalomalacia involving the right temporal, parietal and posterior frontal lobes related to the remote large MCA distribution stroke. 4. Stable moderate to severe generalized atrophy and severe chronic microvascular ischemic changes of the white matter.   Electronically Signed   By: Evangeline Dakin M.D.   On: 03/03/2014 15:09    EKG: Independently reviewed. Afib, non specific T wave changes, V paced occasionally and PVCs  Assessment/Plan  CVA -Acute or subacute extension of the right MCA distribution stroke in the right frontal lobe and basal ganglia -unable to have MRI due to pacemaker -Continue Apixaban -Check ECHo and Carotid duplex -PT/OT/St eval -Neuro consulted   Chronic Atrial fibrillation -on Diltiazem and Apixaban -Apixaban not ideal in setting of valvular dz   Severe AS  -based on last ECHO -not candidate for further intervention for this    Dementia -continue exelon, namenda   COPD -stable, continue nebs PRN  Loose stools -recent Cdiff negative per PCP note today -repeat Cdiff PCR if diarrhea ensues, change Miralax to PRN    History of UTI -on macrobid, FU on Urine cultures from SNF  Adult failure to thrive -given recurrent CVA/Dementia, failure to thrive, will need Hospice/Palliative FU at SNF, seen by palliative care last month  DVT proph: on apixaban  Code Status: DNR, per last admission, Palliative notes DVT Prophylaxis: on apixaban Family Communication: no family at bedside Disposition Plan: SNF, will likely need Hospice  Time spent: 55min  [facility] Pager [phone] **Disclaimer: This note may have been dictated with voice recognition software. Similar sounding words can  inadvertently be transcribed and this note may contain transcription errors which may not have been corrected upon publication of note.**

## 2014-03-03 NOTE — Progress Notes (Signed)
Patient ID: Rita Miller, female   DOB: March 14, 1930, 80 y.o.   MRN: [id number] Arlee Bossard [id number] [birth year] 03/03/2014      Progress Note-Follow Up  Subjective  Chief Complaint  Chief Complaint  Patient presents with  . Headache    back of head hurting    HPI  Patient is a 80 year old female in today for routine medical care. Patient brought in today by an aide from her facility, [facility]. They report yesterday she was in her usual state of health a little fatigued. She was able to help with her dressing and feeding activities and engage in normal conversation. Today she has not been using her left side. She is oriented to person and place and able to identify herself today. She is being presently treated for urinary tract infection with Macrobid but on questioning offers no complaints except for urge to urinate. She denies headache, chest pain, visual loss, headache or neurologic concerns. She denies chest pain, palpitations or shortness of breath. The Aide who is accompanying being her now says she was normal last time she saw her on Friday and today is unable to use her left side. Spoke with an aide who did see the patient yesterday and today who reports yesterday she was moving all extremities in today she is not.  Past Medical History  Diagnosis Date  . CAD (coronary artery disease)   . History of CVA (cerebrovascular accident)   . Atrial fibrillation     peristent  . Hyperlipemia   . COPD (chronic obstructive pulmonary disease)   . Epistaxis     recurrent  . Osteoporosis   . Hypothyroidism   . History of pulmonary embolism   . History of colonic diverticulitis   . History of breast cancer   . Asthma   . Iron deficiency anemia   . History of transfusion of whole blood   . LBBB (left bundle branch block)     history of  . Aortal stenosis     severe  . Pacemaker     bradycardia s/p pacemaker implant  . PAD (peripheral artery disease)     left leg occluded viabahn  stents x 3 left SFA  . Tachycardia-bradycardia syndrome     s/p PPM  . Cancer   . Arthritis   . Stroke   . HTN (hypertension) 09/25/2012  . HTN (hypertension) 09/25/2012  . Esophageal reflux 01/26/2014  . Left-sided visual neglect 03/03/2014    Past Surgical History  Procedure Laterality Date  . Thyroidectomy      due to cancer  . Total hip arthroplasty    . Abdominal hysterectomy    . Mastectomy    . Mitral valve repair    . Intraocular lens insertion      left eye implant  . Pacemaker placement      for tachy/brady syndrome    Family History  Problem Relation Age of Onset  . Breast cancer    . Colon cancer    . Diabetes    . Hyperlipidemia    . Hypertension    . Heart disease Mother     unknown heart disease    History   Social History  . Marital Status: Widowed    Spouse Name: N/A    Number of Children: N/A  . Years of Education: N/A   Occupational History  . Not on file.   Social History Main Topics  . Smoking status: Former Audiological scientist  date: 02/28/1994  . Smokeless tobacco: Never Used     Comment: Quit >20 years  . Alcohol Use: No  . Drug Use: No  . Sexual Activity: Not on file   Other Topics Concern  . Not on file   Social History Narrative   Retired   Widow    1 daughter             Current Outpatient Prescriptions on File Prior to Visit  Medication Sig Dispense Refill  . acetaminophen (TYLENOL) 500 MG tablet Take 1,000 mg by mouth 2 (two) times daily as needed (pain).      Marland Kitchen albuterol (PROVENTIL HFA;VENTOLIN HFA) 108 (90 BASE) MCG/ACT inhaler Inhale 2 puffs into the lungs every 6 (six) hours as needed for wheezing or shortness of breath.  1 Inhaler  3  . apixaban (ELIQUIS) 2.5 MG TABS tablet Take 1 tablet (2.5 mg total) by mouth 2 (two) times daily.  60 tablet    . diltiazem (CARDIZEM CD) 240 MG 24 hr capsule Take 240 mg by mouth daily.       . feeding supplement, ENSURE, (ENSURE) PUDG Take 1 Container by mouth daily.    0  .  fluticasone (FLOVENT HFA) 44 MCG/ACT inhaler Inhale 2 puffs into the lungs daily.       Marland Kitchen guaifenesin (ROBITUSSIN) 100 MG/5ML syrup Take 100 mg by mouth every 4 (four) hours as needed for cough.      . levothyroxine (SYNTHROID, LEVOTHROID) 75 MCG tablet Take 1 tablet (75 mcg total) by mouth daily.  90 tablet  3  . Melatonin 3 MG TABS Take 3 mg by mouth at bedtime.      . memantine (NAMENDA) 10 MG tablet Take 10 mg by mouth 2 (two) times daily.       . metoCLOPramide (REGLAN) 10 MG tablet Take 10 mg by mouth every 6 (six) hours as needed.      . nitrofurantoin, macrocrystal-monohydrate, (MACROBID) 100 MG capsule Take 1 capsule (100 mg total) by mouth 2 (two) times daily.  14 capsule  0  . polyethylene glycol (MIRALAX / GLYCOLAX) packet Take 17 g by mouth daily. Mix 17gm in 6-8 ounces of liquid daily.       . pravastatin (PRAVACHOL) 20 MG tablet Take 1 tablet (20 mg total) by mouth daily.  30 tablet  3  . ranitidine (ZANTAC) 300 MG tablet Take 1 tablet (300 mg total) by mouth at bedtime.  30 tablet  2  . Rivastigmine (EXELON) 13.3 MG/24HR PT24 Place 1 patch onto the skin daily.      Marland Kitchen SALINE NASAL SPRAY NA Place 1 spray into both nostrils 2 (two) times daily. 0.65% saline nasal spray      . tiotropium (SPIRIVA) 18 MCG inhalation capsule Place 18 mcg into inhaler and inhale daily.        . Vaginal Lubricant (REPLENS) GEL Place 1 application vaginally daily as needed (vaginal dryness).      . vitamin E 400 UNIT capsule Take 400 Units by mouth daily.        . Vitamins A & D (VITAMIN A & D) ointment Apply 1 application topically as needed for dry skin.       No current facility-administered medications on file prior to visit.    Allergies  Allergen Reactions  . Donepezil Hydrochloride     REACTION: heart fluttering  . Morphine Sulfate   . Oxycodone-Acetaminophen     REACTION: Hallucinations  . Percocet [Oxycodone-Acetaminophen]  Review of Systems  Review of Systems  Constitutional:  Negative for fever and malaise/fatigue.  HENT: Negative for congestion.   Eyes: Negative for discharge.  Respiratory: Negative for shortness of breath.   Cardiovascular: Negative for chest pain, palpitations and leg swelling.  Gastrointestinal: Negative for nausea, abdominal pain and diarrhea.  Genitourinary: Positive for frequency. Negative for dysuria.  Musculoskeletal: Negative for falls.  Skin: Negative for rash.  Neurological: Positive for focal weakness. Negative for loss of consciousness and headaches.  Endo/Heme/Allergies: Negative for polydipsia.  Psychiatric/Behavioral: Negative for depression and suicidal ideas. The patient is not nervous/anxious and does not have insomnia.     Objective  BP 118/60  Pulse 60  Temp(Src) 97.8 F (36.6 C) (Oral)  Ht 4\' 11"  (1.499 m)  SpO2 96%  Physical Exam  Physical Exam  Constitutional: She is oriented to person, place, and time and well-developed, well-nourished, and in no distress. No distress.  HENT:  Head: Normocephalic and atraumatic.  Eyes: Conjunctivae are normal.  Neck: Neck supple. No thyromegaly present.  Cardiovascular: Normal rate, regular rhythm and normal heart sounds.   No murmur heard. Pulmonary/Chest: Effort normal and breath sounds normal. She has no wheezes.  Abdominal: She exhibits no distension and no mass.  Musculoskeletal: She exhibits no edema.  Lymphadenopathy:    She has no cervical adenopathy.  Neurological: She is alert and oriented to person, place, and time. Coordination abnormal.  Patient rightward gaze, left visual field neglect, no use of left arm or leg. When asked to move both arms and legs only moves the right side.  Skin: Skin is warm and dry. No rash noted. She is not diaphoretic.  Psychiatric: Memory, affect and judgment normal.    Lab Results  Component Value Date   TSH 1.136 11/14/2013   Lab Results  Component Value Date   WBC 6.4 01/17/2014   HGB 11.2* 01/17/2014   HCT 32.9* 01/17/2014    MCV 92.4 01/17/2014   PLT 313 01/17/2014   Lab Results  Component Value Date   CREATININE 1.00 01/17/2014   BUN 16 01/17/2014   NA 135 01/17/2014   K 4.3 01/17/2014   CL 100 01/17/2014   CO2 25 01/17/2014   Lab Results  Component Value Date   ALT 9 01/07/2014   AST 26 01/07/2014   ALKPHOS 75 01/07/2014   BILITOT 0.7 01/07/2014   Lab Results  Component Value Date   CHOL 186 01/08/2014   Lab Results  Component Value Date   HDL 73 01/08/2014   Lab Results  Component Value Date   LDLCALC 102* 01/08/2014   Lab Results  Component Value Date   TRIG 55 01/08/2014   Lab Results  Component Value Date   CHOLHDL 2.5 01/08/2014     Assessment & Plan  HTN (hypertension) Well controlled, no changes to meds. Encouraged heart healthy diet such as the DASH diet and exercise as tolerated.   Left-sided neglect New onset full left sided neglect some time over night or this morning. Spoke with facility this am and an aide that worked all weekend confirmed she appeared in her normal state of health yesterday but woke up today with deficits. Will refer to ED for further work up  History of UTI Urine culture from [facility] confirmed recent UTI is on Macrobid.  Loose stools Recent CDiff culture at [facility] negative  Left-sided visual neglect Right ward gaze unable to track and left visual field neglect noted

## 2014-03-03 NOTE — ED Notes (Signed)
Awaiting transport at this time

## 2014-03-03 NOTE — Consult Note (Signed)
Neurology Consultation Reason for Consult: Worsening left-sided weakness Referring Physician: Fanny Bien.  CC: Left-sided weakness  History is obtained from: Patient, medical record (patient is poor historian)  HPI: Rita Miller is a 80 y.o. female with a history of previous right MCA stroke secondary to atrial fibrillation who apparently was moving her left side fairly well the day prior to admission, but was not seen to be moving much at all the day of admission. She has a history of left MCA stroke, and is noted that she had 3/5 weakness of her left arm during her previous admission  She was referred to the ED where a CT was performed which shows extension of her previous infarct.  LKW: 7/26 Unclear tpa given?: no, outside of window    ROS: A 14 point ROS was performed and is negative except as noted in the HPI.   Past Medical History  Diagnosis Date  . CAD (coronary artery disease)   . History of CVA (cerebrovascular accident)   . Atrial fibrillation     peristent  . Hyperlipemia   . COPD (chronic obstructive pulmonary disease)   . Epistaxis     recurrent  . Osteoporosis   . Hypothyroidism   . History of pulmonary embolism   . History of colonic diverticulitis   . History of breast cancer   . Asthma   . Iron deficiency anemia   . History of transfusion of whole blood   . LBBB (left bundle branch block)     history of  . Aortal stenosis     severe  . Pacemaker     bradycardia s/p pacemaker implant  . PAD (peripheral artery disease)     left leg occluded viabahn stents x 3 left SFA  . Tachycardia-bradycardia syndrome     s/p PPM  . Cancer   . Arthritis   . Stroke   . HTN (hypertension) 09/25/2012  . HTN (hypertension) 09/25/2012  . Esophageal reflux 01/26/2014  . Left-sided visual neglect 03/03/2014    Family History: Heart disease, hyperlipidemia, diabetes  Social History: Tob: Previous smoker  Exam: Current vital signs: BP 144/66  Pulse 94  Temp(Src)  97.9 F (36.6 C) (Oral)  Resp 27  Ht 5' (1.524 m)  Wt 40.914 kg (90 lb 3.2 oz)  BMI 17.62 kg/m2  SpO2 96% Vital signs in last 24 hours: Temp:  [97.6 F (36.4 C)-97.9 F (36.6 C)] 97.9 F (36.6 C) (07/27 1920) Pulse Rate:  [60-94] 94 (07/27 1920) Resp:  [16-27] 27 (07/27 1920) BP: (118-151)/(57-75) 144/66 mmHg (07/27 1920) SpO2:  [94 %-96 %] 96 % (07/27 1920) Weight:  [40.914 kg (90 lb 3.2 oz)] 40.914 kg (90 lb 3.2 oz) (07/27 1920)  General: In bed, NAD CV: Regular rate and rhythm Mental Status: Patient is awake, alert, oriented to person only. (2009, June, nursing home) No signs of aphasia  She has a left neglect Cranial Nerves: II: Visual Fields with left hemianopia. Pupils are equal, round, and reactive to light.  Discs are difficult to visualize. III,IV, VI: Right gaze preference V: Facial sensation is decreased on left  VII: Facial movement is  left-sided weakness VIII: hearing is intact to voice X: Uvula elevates symmetrically XI: Shoulder shrug is symmetric. XII: tongue midline Motor: Tone is normal on right. Bulk is normal. 5/5 strength was present on the right, she has only a flicker of movement in her left hand, mild flexion to noxious stimuli. He has increased tone in both the left arm  and leg but does have some movement of the left foot. Sensory: Sensation is decreased on the left, + extinction Deep Tendon Reflexes: Hyperreflexic on the left Cerebellar: FNF intact on right  Gait:  Not performed secondary to patient safety concerns  I have reviewed labs in epic and the results pertinent to this consultation are: Mildly elevated glucose, low albumin  I have reviewed the images obtained: CT head-old right MCA infarct with area of hypodensity in the previously spared [employer].  Impression:  80 year old female with extension the right MCA infarct. In the setting of acute stroke, I would favor holding her anticoagulation for now. When restarting, I do wonder  if Coumadin in the setting of her valvular abnormalities would be reasonable.  Also, hospice has been discussed with her in the past.  Recommendations: 1. HgbA1c, fasting lipid panel 2. Frequent neuro checks 3. Echocardiogram 4. Prophylactic therapy-Antiplatelet med: Aspirin - dose 325mg  PO or 300mg  PR 5. Risk factor modification 6. Telemetry monitoring 7. PT consult, OT consult, Speech consult    Roland Rack, MD [facility] [phone]  If 7pm- 7am, please page neurology on call as listed in [place].

## 2014-03-03 NOTE — Assessment & Plan Note (Signed)
Well controlled, no changes to meds. Encouraged heart healthy diet such as the DASH diet and exercise as tolerated.  °

## 2014-03-03 NOTE — ED Provider Notes (Signed)
CSN: [id number]     Arrival date & time 03/03/14  1353 History  This chart was scribed for Osvaldo Shipper, MD by Lowella Petties, ED Scribe. The patient was seen in room MH05/MH05. Patient's care was started at 3:15 PM.     Chief Complaint  Patient presents with  . Weakness   Patient is a 80 y.o. female presenting with weakness. The history is provided by the patient and the nursing home. No language interpreter was used.  Weakness This is a new problem. The problem occurs constantly. The problem has not changed since onset.Pertinent negatives include no chest pain, no abdominal pain, no headaches and no shortness of breath.   HPI Comments: Rita Miller is a 80 y.o. female who presents to the Emergency Department complaining of generalized weakness onset earlier today. Her aide from [facility] states that she noticed complete weakness and loss of sensation in the left side of her body 3 hours ago. Unknown last seen normal as aide found her this way. Her aide reports that she is not usually weak on the left side, and does not normally appear slumped as she does now. Her aide states that she does not know when she was last seen as normal. She states that the patient has a history of stroke and weakness in her left side at baseline. Patient states she's uncomfortable lying in the bed.  Past Medical History  Diagnosis Date  . CAD (coronary artery disease)   . History of CVA (cerebrovascular accident)   . Atrial fibrillation     peristent  . Hyperlipemia   . COPD (chronic obstructive pulmonary disease)   . Epistaxis     recurrent  . Osteoporosis   . Hypothyroidism   . History of pulmonary embolism   . History of colonic diverticulitis   . History of breast cancer   . Asthma   . Iron deficiency anemia   . History of transfusion of whole blood   . LBBB (left bundle branch block)     history of  . Aortal stenosis     severe  . Pacemaker     bradycardia s/p  pacemaker implant  . PAD (peripheral artery disease)     left leg occluded viabahn stents x 3 left SFA  . Tachycardia-bradycardia syndrome     s/p PPM  . Cancer   . Arthritis   . Stroke   . HTN (hypertension) 09/25/2012  . HTN (hypertension) 09/25/2012  . Esophageal reflux 01/26/2014  . Left-sided visual neglect 03/03/2014   Past Surgical History  Procedure Laterality Date  . Thyroidectomy      due to cancer  . Total hip arthroplasty    . Abdominal hysterectomy    . Mastectomy    . Mitral valve repair    . Intraocular lens insertion      left eye implant  . Pacemaker placement      for tachy/brady syndrome   Family History  Problem Relation Age of Onset  . Breast cancer    . Colon cancer    . Diabetes    . Hyperlipidemia    . Hypertension    . Heart disease Mother     unknown heart disease   History  Substance Use Topics  . Smoking status: Former Smoker    Quit date: 02/28/1994  . Smokeless tobacco: Never Used     Comment: Quit >20 years  . Alcohol Use: No   OB History  Grav Para Term Preterm Abortions TAB SAB Ect Mult Living                 Review of Systems  Unable to perform ROS: Acuity of condition  Respiratory: Negative for shortness of breath.   Cardiovascular: Negative for chest pain.  Gastrointestinal: Negative for abdominal pain.  Neurological: Positive for weakness (left side). Negative for headaches.  All other systems reviewed and are negative.     Allergies  Donepezil hydrochloride; Morphine sulfate; Oxycodone-acetaminophen; and Percocet  Home Medications   Prior to Admission medications   Medication Sig Start Date End Date Taking? Authorizing Provider  acetaminophen (TYLENOL) 500 MG tablet Take 1,000 mg by mouth 2 (two) times daily as needed (pain).    Historical Provider, MD  albuterol (PROVENTIL HFA;VENTOLIN HFA) 108 (90 BASE) MCG/ACT inhaler Inhale 2 puffs into the lungs every 6 (six) hours as needed for wheezing or shortness of  breath. 12/31/13   Mosie Lukes, MD  apixaban (ELIQUIS) 2.5 MG TABS tablet Take 1 tablet (2.5 mg total) by mouth 2 (two) times daily. 01/09/14   Thurnell Lose, MD  diltiazem (CARDIZEM CD) 240 MG 24 hr capsule Take 240 mg by mouth daily.     Historical Provider, MD  feeding supplement, ENSURE, (ENSURE) PUDG Take 1 Container by mouth daily. 01/09/14   Thurnell Lose, MD  fluticasone (FLOVENT HFA) 44 MCG/ACT inhaler Inhale 2 puffs into the lungs daily.     Historical Provider, MD  guaifenesin (ROBITUSSIN) 100 MG/5ML syrup Take 100 mg by mouth every 4 (four) hours as needed for cough.    Historical Provider, MD  levothyroxine (SYNTHROID, LEVOTHROID) 75 MCG tablet Take 1 tablet (75 mcg total) by mouth daily. 08/02/13   Mosie Lukes, MD  Melatonin 3 MG TABS Take 3 mg by mouth at bedtime.    Historical Provider, MD  memantine (NAMENDA) 10 MG tablet Take 10 mg by mouth 2 (two) times daily.     Historical Provider, MD  nitrofurantoin, macrocrystal-monohydrate, (MACROBID) 100 MG capsule Take 1 capsule (100 mg total) by mouth 2 (two) times daily. 02/28/14   Mosie Lukes, MD  polyethylene glycol (MIRALAX / GLYCOLAX) packet Take 17 g by mouth daily. Mix 17gm in 6-8 ounces of liquid daily.     Historical Provider, MD  pravastatin (PRAVACHOL) 20 MG tablet Take 1 tablet (20 mg total) by mouth daily. 11/26/13   Mosie Lukes, MD  ranitidine (ZANTAC) 300 MG tablet Take 1 tablet (300 mg total) by mouth at bedtime. 01/17/14   Mosie Lukes, MD  Rivastigmine (EXELON) 13.3 MG/24HR PT24 Place 1 patch onto the skin daily.    Historical Provider, MD  SALINE NASAL SPRAY NA Place 1 spray into both nostrils 2 (two) times daily. 0.65% saline nasal spray    Historical Provider, MD  tiotropium (SPIRIVA) 18 MCG inhalation capsule Place 18 mcg into inhaler and inhale daily.      Historical Provider, MD  Vaginal Lubricant (REPLENS) GEL Place 1 application vaginally daily as needed (vaginal dryness).    Historical Provider, MD   vitamin E 400 UNIT capsule Take 400 Units by mouth daily.      Historical Provider, MD  Vitamins A & D (VITAMIN A & D) ointment Apply 1 application topically as needed for dry skin.    Historical Provider, MD   Triage Vitals: BP 148/57  Pulse 86  Temp(Src) 97.6 F (36.4 C) (Oral)  Resp 16  SpO2 96% Physical Exam  Nursing note and vitals reviewed. Constitutional: She appears well-developed and well-nourished. No distress.  HENT:  Head: Normocephalic and atraumatic.  Eyes: Conjunctivae and EOM are normal.  Neck: Neck supple. No tracheal deviation present.  Cardiovascular: Normal rate and regular rhythm.   Pulmonary/Chest: Effort normal. No respiratory distress.  Abdominal: Soft. There is no tenderness. There is no rebound.  Musculoskeletal: Normal range of motion.  Neurological: She is alert. A cranial nerve deficit (L sided facial weakness, eyes deviated to the R) and sensory deficit (L arm and leg) is present. She exhibits abnormal muscle tone (flaccid paralysis of L arm. Spastic paralysis of L leg). GCS eye subscore is 4. GCS verbal subscore is 4. GCS motor subscore is 6.  Eyes deviated to the R   Skin: Skin is warm and dry.  Psychiatric: She has a normal mood and affect. Her behavior is normal.    ED Course  Procedures (including critical care time) DIAGNOSTIC STUDIES: Oxygen Saturation is 96% on room air, normal by my interpretation.    COORDINATION OF CARE: 3:22 PM-Discussed treatment plan with pt at bedside and pt agreed to plan.   Labs Review Labs Reviewed  PROTIME-INR - Abnormal; Notable for the following:    Prothrombin Time 15.4 (*)    All other components within normal limits  CBC - Abnormal; Notable for the following:    RBC 3.82 (*)    All other components within normal limits  DIFFERENTIAL - Abnormal; Notable for the following:    Neutrophils Relative % 87 (*)    Neutro Abs 8.0 (*)    Lymphocytes Relative 6 (*)    Lymphs Abs 0.6 (*)    All other  components within normal limits  COMPREHENSIVE METABOLIC PANEL - Abnormal; Notable for the following:    Glucose, Bld 176 (*)    BUN 27 (*)    Albumin 2.9 (*)    GFR calc non Af Amer 46 (*)    GFR calc Af Amer 53 (*)    All other components within normal limits  ETHANOL  APTT  TROPONIN I    Imaging Review Ct Head Wo Contrast  03/03/2014   CLINICAL DATA:  Acute mental status changes. Prior history of stroke.  EXAM: CT HEAD WITHOUT CONTRAST  TECHNIQUE: Contiguous axial images were obtained from the base of the skull through the vertex without intravenous contrast.  COMPARISON:  01/07/2014, 01/06/2014, 11/07/1009.  FINDINGS: Remote large right middle cerebral artery distribution stroke with encephalomalacia in the right temporal lobe, right frontal lobe and right parietal lobe. There is evidence of extension of the stroke to involve more of the right frontal cortex and the right basal ganglia since the most recent examination 01/07/2014. No associated hemorrhage or hematoma. Mild mass effect, with effacement of cortical sulci in the right frontal lobe and minimal shift of the midline to the left approximating 3 mm. No extra-axial fluid collections. Moderate to severe cortical and deep atrophy and moderate to severe cerebellar atrophy, unchanged. Severe changes of small vessel disease of the white matter diffusely, unchanged.  No skull fracture or other focal osseous abnormality involving the skull. Visualized paranasal sinuses, bilateral mastoid air cells and bilateral middle ear cavities well-aerated. Extensive bilateral carotid siphon and vertebral artery atherosclerosis.  IMPRESSION: 1. Acute or subacute extension of the right middle cerebral artery distribution stroke in the right frontal lobe and basal ganglia, new since the 01/07/2014 examination, without associated hemorrhage or hematoma. 2. Minimal mass effect with effacement of cortical sulci in  the right frontal lobe and slight midline shift to  the left approximating 3 mm. 3. Otherwise, stable encephalomalacia involving the right temporal, parietal and posterior frontal lobes related to the remote large MCA distribution stroke. 4. Stable moderate to severe generalized atrophy and severe chronic microvascular ischemic changes of the white matter.   Electronically Signed   By: Evangeline Dakin M.D.   On: 03/03/2014 15:09     EKG Interpretation None      MDM   Final diagnoses:  Stroke  Left-sided weakness    79 year old female presents with concerns for stroke. Last seen normal unknown. Her aide found her around noon, little over 3 hours ago, slumped to the left a left-sided weakness. Had a stroke almost 2 months ago with left-sided weakness. Here today patient is confused, doesn't know why she is here. She is uncooperative with history taking. She does follow commands, but does not understand the situation. Her vitals are stable. No elevated hypertension. Head CT shows worsening right-sided stroke with mild edema. I spoke with Dr. Nicole Kindred of neurology. We agreed she is not a TPA candidate due to her being on eliquis. [facility].  I personally performed the services described in this documentation, which was scribed in my presence. The recorded information has been reviewed and is accurate.     Osvaldo Shipper, MD 03/03/14 [phone]

## 2014-03-03 NOTE — Assessment & Plan Note (Signed)
Recent CDiff culture at [facility] negative

## 2014-03-03 NOTE — Progress Notes (Signed)
Pre visit review using our clinic review tool, if applicable. No additional management support is needed unless otherwise documented below in the visit note. 

## 2014-03-03 NOTE — ED Notes (Signed)
Report called to [facility] on [address]

## 2014-03-04 ENCOUNTER — Encounter (HOSPITAL_COMMUNITY): Payer: Self-pay | Admitting: *Deleted

## 2014-03-04 DIAGNOSIS — E43 Unspecified severe protein-calorie malnutrition: Secondary | ICD-10-CM

## 2014-03-04 DIAGNOSIS — Z950 Presence of cardiac pacemaker: Secondary | ICD-10-CM

## 2014-03-04 DIAGNOSIS — M6281 Muscle weakness (generalized): Secondary | ICD-10-CM

## 2014-03-04 DIAGNOSIS — I359 Nonrheumatic aortic valve disorder, unspecified: Secondary | ICD-10-CM

## 2014-03-04 DIAGNOSIS — J441 Chronic obstructive pulmonary disease with (acute) exacerbation: Secondary | ICD-10-CM

## 2014-03-04 DIAGNOSIS — I422 Other hypertrophic cardiomyopathy: Secondary | ICD-10-CM

## 2014-03-04 DIAGNOSIS — I4891 Unspecified atrial fibrillation: Secondary | ICD-10-CM

## 2014-03-04 DIAGNOSIS — I10 Essential (primary) hypertension: Secondary | ICD-10-CM

## 2014-03-04 DIAGNOSIS — Z86711 Personal history of pulmonary embolism: Secondary | ICD-10-CM

## 2014-03-04 DIAGNOSIS — Z7901 Long term (current) use of anticoagulants: Secondary | ICD-10-CM

## 2014-03-04 DIAGNOSIS — I2789 Other specified pulmonary heart diseases: Secondary | ICD-10-CM

## 2014-03-04 DIAGNOSIS — Z515 Encounter for palliative care: Secondary | ICD-10-CM

## 2014-03-04 LAB — LIPID PANEL
CHOL/HDL RATIO: 2.6 ratio
Cholesterol: 148 mg/dL (ref 0–200)
HDL: 58 mg/dL (ref >39)
LDL CALC: 74 mg/dL (ref 0–99)
Triglycerides: 79 mg/dL (ref <150)
VLDL: 16 mg/dL (ref 0–40)

## 2014-03-04 LAB — HEMOGLOBIN A1C
Hgb A1c MFr Bld: 5.5 % (ref <5.7)
Mean Plasma Glucose: 111 mg/dL (ref <117)

## 2014-03-04 MED ORDER — CHLORHEXIDINE GLUCONATE 0.12 % MT SOLN
15.0000 mL | Freq: Two times a day (BID) | OROMUCOSAL | Status: DC
  Administered 2014-03-04 – 2014-03-07 (×6): 15 mL via OROMUCOSAL
  Filled 2014-03-04 (×8): qty 15

## 2014-03-04 MED ORDER — ASPIRIN EC 325 MG PO TBEC
325.0000 mg | DELAYED_RELEASE_TABLET | Freq: Every day | ORAL | Status: DC
  Filled 2014-03-04: qty 1

## 2014-03-04 MED ORDER — MEMANTINE HCL 10 MG PO TABS
10.0000 mg | ORAL_TABLET | Freq: Two times a day (BID) | ORAL | Status: DC
  Administered 2014-03-04 – 2014-03-07 (×7): 10 mg via ORAL
  Filled 2014-03-04 (×8): qty 1

## 2014-03-04 MED ORDER — CEFAZOLIN SODIUM 1-5 GM-% IV SOLN
1.0000 g | Freq: Two times a day (BID) | INTRAVENOUS | Status: DC
  Administered 2014-03-04 – 2014-03-07 (×7): 1 g via INTRAVENOUS
  Filled 2014-03-04 (×8): qty 50

## 2014-03-04 MED ORDER — RIVASTIGMINE 13.3 MG/24HR TD PT24
13.3000 mg | MEDICATED_PATCH | TRANSDERMAL | Status: DC
  Administered 2014-03-04 – 2014-03-06 (×3): 13.3 mg via TRANSDERMAL
  Filled 2014-03-04 (×4): qty 1

## 2014-03-04 MED ORDER — ENSURE COMPLETE PO LIQD
237.0000 mL | Freq: Two times a day (BID) | ORAL | Status: DC
  Administered 2014-03-04 – 2014-03-06 (×4): 237 mL via ORAL

## 2014-03-04 MED ORDER — APIXABAN 2.5 MG PO TABS
2.5000 mg | ORAL_TABLET | Freq: Two times a day (BID) | ORAL | Status: DC
  Administered 2014-03-04 – 2014-03-05 (×3): 2.5 mg via ORAL
  Filled 2014-03-04 (×4): qty 1

## 2014-03-04 MED ORDER — METOPROLOL TARTRATE 1 MG/ML IV SOLN: 5.0000 mg | INTRAVENOUS | Status: DC | PRN

## 2014-03-04 MED ORDER — ASPIRIN 600 MG RE SUPP
600.0000 mg | Freq: Every day | RECTAL | Status: DC
Start: 2014-03-04 — End: 2014-03-04
  Filled 2014-03-04: qty 1

## 2014-03-04 MED ORDER — FAMOTIDINE IN NACL 20-0.9 MG/50ML-% IV SOLN
20.0000 mg | Freq: Every day | INTRAVENOUS | Status: DC
  Administered 2014-03-04 – 2014-03-06 (×3): 20 mg via INTRAVENOUS
  Filled 2014-03-04 (×3): qty 50

## 2014-03-04 MED ORDER — BIOTENE DRY MOUTH MT LIQD
15.0000 mL | Freq: Two times a day (BID) | OROMUCOSAL | Status: DC
  Administered 2014-03-04 – 2014-03-06 (×5): 15 mL via OROMUCOSAL

## 2014-03-04 MED ORDER — RIVASTIGMINE 4.6 MG/24HR TD PT24
13.3000 mg | MEDICATED_PATCH | TRANSDERMAL | Status: DC
  Filled 2014-03-04: qty 3

## 2014-03-04 MED ORDER — LEVOTHYROXINE SODIUM 100 MCG IV SOLR
37.5000 ug | Freq: Every day | INTRAVENOUS | Status: DC
  Administered 2014-03-04 – 2014-03-06 (×3): 37.5 ug via INTRAVENOUS
  Filled 2014-03-04 (×3): qty 5

## 2014-03-04 MED ORDER — ASPIRIN 300 MG RE SUPP: 300.0000 mg | Freq: Every day | RECTAL | Status: DC

## 2014-03-04 NOTE — Consult Note (Signed)
Patient Rita Miller      DOB: Dec 26, 1929      GOT:[id number]     Consult Note from the Palliative Medicine Team at [employer] Requested by: Erin Hearing, NP     PCP: Penni Homans, MD Reason for Consultation: [place] and hospice options.   Phone Number:[phone]  Assessment of patients Current state: Brief HPI: Rita Miller is an 80 yo female admitted with extension of recent stroke with left hemiparesis. PMH significant for recent right MCA CVA, atrial fibrillation, severe aortic stenosis, tachy-brady syndrome with pacemaker, CAD, COPD (2L [place] at home according to family), PE, diverticulitis, h/o breast cancer.   I spoke today with Rita Miller daughter, Rita Miller. I met Rita Miller last month when Rita Miller had her original stroke. Rita Miller was happy that her mother had recovered some function and able to toss a ball and walk with walker to the point where she was able to bring her to her home on the weekends as she was hoping. We discussed how difficult it will be for Rita Miller to get back to this point but Rita Miller wishes to try. We did discuss Rita Miller's severe muscle wasting and weight loss from 98 lbs 01/07/14 to 90 lbs now. Rita Miller says she has been drinking boost and ensure but has not been eating well. Rita Miller says that she is accepting if Rita Miller is at end of life and passes but she also doesn't want to make that happen any sooner. She wishes to continue IV fluids, antibiotics and gentle treatment but confirms DNR and NO feeding tube. We also discussed the use of hospice and why she may decide not to bring her back to the hospital in the future and Rita Miller is open to this for consideration in the future but not at this point. We discussed hospice last admission and Rita Miller met with them at [facility] but did not wish to proceed at that time (was also told Rita Miller was no eligible). With her recent weight loss and decreased appetite that will be further complicated by extension of her stroke as well as no desire for  tube feeding, prognosis remains poor <6 months and should be hospice eligible. However, Rita Miller would like to pursue rehab at first if she is able to participate. I will continue to follow and support Rita Miller and Rita Miller.    Goals of Care: 1.  Code Status: DNR   2. Scope of Treatment: Continue supportive medical therapies and physical therapy when able. DNR and no feeding tube.    4. Disposition: SNF.    3. Symptom Management:   1. Weakness: Continue medical management. SLP following. Consider PT/OT when appropriate.   4. Psychosocial: Emotional support provided to patient and family.    ROS: Denies pain. Does say "bed is uncomfortable."    PMH:  Past Medical History  Diagnosis Date  . CAD (coronary artery disease)   . History of CVA (cerebrovascular accident)   . Atrial fibrillation     peristent  . Hyperlipemia   . COPD (chronic obstructive pulmonary disease)   . Epistaxis     recurrent  . Osteoporosis   . Hypothyroidism   . History of pulmonary embolism   . History of colonic diverticulitis   . History of breast cancer   . Asthma   . Iron deficiency anemia   . History of transfusion of whole blood   . LBBB (left bundle branch block)     history of  .  Aortal stenosis     severe  . Pacemaker     bradycardia s/p pacemaker implant  . PAD (peripheral artery disease)     left leg occluded viabahn stents x 3 left SFA  . Tachycardia-bradycardia syndrome     s/p PPM  . Cancer   . Arthritis   . Stroke   . HTN (hypertension) 09/25/2012  . HTN (hypertension) 09/25/2012  . Esophageal reflux 01/26/2014  . Left-sided visual neglect 03/03/2014     PSH: Past Surgical History  Procedure Laterality Date  . Thyroidectomy      due to cancer  . Total hip arthroplasty    . Abdominal hysterectomy    . Mitral valve repair    . Intraocular lens insertion      left eye implant  . Pacemaker placement      for tachy/brady syndrome  . Mastectomy     I have reviewed the FH and  SH and  If appropriate update it with new information. Allergies  Allergen Reactions  . Donepezil Hydrochloride     REACTION: heart fluttering  . Morphine Sulfate   . Oxycodone-Acetaminophen     REACTION: Hallucinations   Scheduled Meds: . apixaban  5 mg Oral BID  .  ceFAZolin (ANCEF) IV  1 g Intravenous Q12H  . famotidine (PEPCID) IV  20 mg Intravenous Daily  . fluticasone  2 puff Inhalation Daily  . levothyroxine  37.5 mcg Intravenous Daily  . memantine  10 mg Oral BID  . tiotropium  18 mcg Inhalation Daily   Continuous Infusions:  PRN Meds:.albuterol, metoprolol    BP 140/54  Pulse 83  Temp(Src) 97.5 F (36.4 C) (Oral)  Resp 24  Ht 5' (1.524 m)  Wt 40.914 kg (90 lb 3.2 oz)  BMI 17.62 kg/m2  SpO2 97%   PPS: 30% at best  No intake or output data in the 24 hours ending 03/04/14 1326 LBM: 03/04/14                         Physical Exam:  General: NAD, pleasant, cachectic HEENT: Severe temporal muscle wasting, no JVD, oral mucosa moist without exudate Chest: CTA throughout, no labored breathing, symmetric CVS: RRR, S1 S2 Abdomen: Soft, NT, ND, +BS Ext: Bilateral feet cool to touch, weakened left side  Neuro: Arousable, speaks softly, oriented to person only  Labs: CBC    Component Value Date/Time   WBC 9.2 03/03/2014 1420   WBC 6.6 08/30/2013 1049   RBC 3.82* 03/03/2014 1420   RBC 3.64* 08/30/2013 1049   RBC 3.25* 09/21/2011 1402   HGB 12.2 03/03/2014 1420   HGB 11.2* 08/30/2013 1049   HCT 37.6 03/03/2014 1420   HCT 35.9 08/30/2013 1049   PLT 154 03/03/2014 1420   PLT 233 08/30/2013 1049   MCV 98.4 03/03/2014 1420   MCV 99 08/30/2013 1049   MCH 31.9 03/03/2014 1420   MCH 30.8 08/30/2013 1049   MCHC 32.4 03/03/2014 1420   MCHC 31.2* 08/30/2013 1049   RDW 14.6 03/03/2014 1420   RDW 14.9 08/30/2013 1049   LYMPHSABS 0.6* 03/03/2014 1420   LYMPHSABS 1.0 08/30/2013 1049   MONOABS 0.3 03/03/2014 1420   EOSABS 0.4 03/03/2014 1420   EOSABS 0.1 08/30/2013 1049   BASOSABS 0.0  03/03/2014 1420   BASOSABS 0.0 08/30/2013 1049    BMET    Component Value Date/Time   NA 137 03/03/2014 1420   K 3.9 03/03/2014 1420   CL  101 03/03/2014 1420   CO2 23 03/03/2014 1420   GLUCOSE 176* 03/03/2014 1420   BUN 27* 03/03/2014 1420   CREATININE 1.10 03/03/2014 1420   CREATININE 1.00 01/17/2014 1352   CALCIUM 9.0 03/03/2014 1420   GFRNONAA 46* 03/03/2014 1420   GFRNONAA 49* 11/04/2010 1120   GFRAA 53* 03/03/2014 1420   GFRAA 59* 11/04/2010 1120    CMP     Component Value Date/Time   NA 137 03/03/2014 1420   K 3.9 03/03/2014 1420   CL 101 03/03/2014 1420   CO2 23 03/03/2014 1420   GLUCOSE 176* 03/03/2014 1420   BUN 27* 03/03/2014 1420   CREATININE 1.10 03/03/2014 1420   CREATININE 1.00 01/17/2014 1352   CALCIUM 9.0 03/03/2014 1420   PROT 7.6 03/03/2014 1420   ALBUMIN 2.9* 03/03/2014 1420   AST 24 03/03/2014 1420   ALT 7 03/03/2014 1420   ALKPHOS 74 03/03/2014 1420   BILITOT 0.8 03/03/2014 1420   GFRNONAA 46* 03/03/2014 1420   GFRNONAA 49* 11/04/2010 1120   GFRAA 53* 03/03/2014 1420   GFRAA 59* 11/04/2010 1120    Time In Time Out Total Time Spent with Patient Total Overall Time  1230 1340 34mn 775m    Greater than 50%  of this time was spent counseling and coordinating care related to the above assessment and plan.   AlVinie SillNP Palliative Medicine Team Pager # [phone]-F 8a-5p) Team Phone # [phone]/Weekends)

## 2014-03-04 NOTE — Progress Notes (Signed)
Utilization Review Completed.  

## 2014-03-04 NOTE — Progress Notes (Signed)
VASCULAR LAB PRELIMINARY  PRELIMINARY  PRELIMINARY  PRELIMINARY  Carotid duplex completed.    Preliminary report:  Bilateral:  1-39% ICA stenosis.  Vertebral artery flow is antegrade.     Wesly Whisenant, [place], RVS 03/04/2014, 5:49 PM

## 2014-03-04 NOTE — Evaluation (Signed)
Occupational Therapy Evaluation Patient Details Name: Rita Miller MRN: [id number] DOB: 1930/01/31 Today's Date: 03/04/2014    History of Present Illness 80 y.o. female with PMH of Right MCA CVA in 6/15, Afib, dementia, Aortic stenosis, CAD, COPD, tachy-brady syndrome s/p Pacemaker, GERD, h/o breast CA admitted for left sided weakness.  CXR COPD and chronic scarring in the peripheral right upper lung. No definite acute cardiopulmonary disease.  CT revealed Acute or subacute extension of the right middle cerebral artery distribution stroke in the right frontal lobe and basal ganglia, new since the 01/07/2014 examination, without associated hemorrhage or hematoma.  Per MD notes, pt. seen by Palliative care last month and recommendations made for Palliative care FU at SNF.    Clinical Impression   Pt admitted with above. She demonstrates the below listed deficits and will benefit from continued OT to maximize safety and independence with BADLs.  Pt presents to OT with Lt hemiplegia; Lt neglect; pusher syndrome; impaired cognition.  Currently, she requires total A with all BADLs.   Pt admitted from SNF with plan to return to SNF and palliative consult is pending.  Feel all further OT needs can be addressed at SNF.  Will sign off.       Follow Up Recommendations  SNF    Equipment Recommendations  None recommended by OT    Recommendations for Other Services       Precautions / Restrictions Precautions Precautions: Fall Precaution Comments: L sided hemiplegia; Lt neglect Restrictions Weight Bearing Restrictions: No      Mobility Bed Mobility Overal bed mobility: Needs Assistance;+2 for physical assistance Bed Mobility: Supine to Sit     Supine to sit: Mod assist;+2 for physical assistance     General bed mobility comments: Bed pad used to assist in scooting. Assist for LE movement and trunk elevation. Pt able to grab bed rail with RUE with cueing.   Transfers Overall transfer  level: Needs assistance Equipment used: 2 person hand held assist Transfers: Sit to/from Stand Sit to Stand: Total assist Stand pivot transfers: Max assist;+2 physical assistance       General transfer comment: Attempted to move pt into standing position, but unable to do so with +1 total assist.  Pt pushes significantly to the Left    Balance Overall balance assessment: Needs assistance Sitting-balance support: Feet supported Sitting balance-Leahy Scale: Poor     Standing balance support: Single extremity supported Standing balance-Leahy Scale: Zero                              ADL Overall ADL's : Needs assistance/impaired Eating/Feeding: Maximal assistance;Sitting   Grooming: Wash/dry hands;Wash/dry face;Brushing hair;Maximal assistance;Sitting Grooming Details (indicate cue type and reason): cues and assist for thoroughness and to attend to Lt.  Upper Body Bathing: Total assistance;Sitting   Lower Body Bathing: Total assistance;Sit to/from stand   Upper Body Dressing : Total assistance;Sitting   Lower Body Dressing: Total assistance;Sit to/from stand   Toilet Transfer: Total assistance;+2 for physical assistance;Squat-pivot;BSC   Toileting- Clothing Manipulation and Hygiene: Total assistance;Sit to/from stand       Functional mobility during ADLs: Total assistance;+2 for physical assistance (Pt pushes to Lt. ) General ADL Comments: Pt unable to sustain attention to complete BADLs     Vision                 Additional Comments: Pt unable to participate in formal visual assessment due to cognitive deficits.  Pt with Rt gaze preference.  She is unable to locate items on her Left even with max verbal and tactile cues.  Unable to determine if she has a field deficit due to severity of neglect.     Perception Perception Perception Tested?: Yes Perception Deficits: Inattention/neglect Inattention/Neglect: Does not attend to left visual field;Does not  attend to left side of body   Praxis Praxis Praxis tested?: Deficits Deficits: Initiation    Pertinent Vitals/Pain Denied pain      Hand Dominance Right   Extremity/Trunk Assessment Upper Extremity Assessment Upper Extremity Assessment: LUE deficits/detail LUE Deficits / Details: Pt demonstrates shoulder shrug actively when she is asked to lift her arm.  No other active movment noted.  PROM WFL LUE Sensation: decreased light touch;decreased proprioception (assessed via observation) LUE Coordination: decreased fine motor;decreased gross motor   Lower Extremity Assessment Lower Extremity Assessment: Defer to PT evaluation LLE Deficits / Details: Residual weakness. Unable to MMT, however was able to show small AROM while sitting EOB in knee extensors.    Cervical / Trunk Assessment Cervical / Trunk Assessment: Kyphotic   Communication Communication Communication: Expressive difficulties   Cognition Arousal/Alertness: Lethargic Behavior During Therapy: Flat affect Overall Cognitive Status: No family/caregiver present to determine baseline cognitive functioning Area of Impairment: Attention;Following commands;Problem solving   Current Attention Level: Sustained   Following Commands: Follows one step commands inconsistently (with increased time and with verbal cues)     Problem Solving: Slow processing;Decreased initiation;Difficulty sequencing;Requires verbal cues;Requires tactile cues General Comments: No family present to provide info re: pt's baseline   General Comments       Exercises Exercises: General Lower Extremity     Shoulder Instructions      Home Living Family/patient expects to be discharged to:: Skilled nursing facility   Available Help at Discharge: [place] Type of Home: [place]                                  Prior Functioning/Environment          Comments: No family members present during  session. Pt unable to provide any history.     OT Diagnosis: Generalized weakness;Cognitive deficits;Disturbance of vision;Hemiplegia non-dominant side   OT Problem List: Decreased strength;Decreased range of motion;Decreased activity tolerance;Impaired balance (sitting and/or standing);Impaired vision/perception;Decreased coordination;Decreased cognition;Decreased safety awareness;Decreased knowledge of use of DME or AE;Cardiopulmonary status limiting activity;Impaired UE functional use   OT Treatment/Interventions:      OT Goals(Current goals can be found in the care plan section) Acute Rehab OT Goals Patient Stated Goal: None stated  OT Frequency:     Barriers to D/C:            Co-evaluation              End of Session    Activity Tolerance: Patient limited by fatigue Patient left: in chair;with call bell/phone within reach;with chair alarm set   Time: 0272-5366 OT Time Calculation (min): 11 min Charges:  OT Evaluation $Initial OT Evaluation Tier I: 1 Procedure G-Codes:    Lucille Passy M 03/18/14, 3:57 PM

## 2014-03-04 NOTE — Evaluation (Signed)
Speech Language Pathology Evaluation Patient Details Name: Rita Miller MRN: [id number] DOB: [birth year] Today's Date: 03/04/2014 Time: 1308-6578 SLP Time Calculation (min): 10 min  Problem List:  Patient Active Problem List   Diagnosis Date Noted  . Left-sided neglect 03/03/2014  . Left-sided visual neglect 03/03/2014  . Stroke 03/03/2014  . Loose stools 02/18/2014  . History of UTI 02/18/2014  . Esophageal reflux 01/26/2014  . Protein-calorie malnutrition, severe 01/09/2014  . Palliative care encounter 01/08/2014  . CVA (cerebral infarction) 01/07/2014  . PAD (peripheral artery disease)   . Pacemaker   . COPD (chronic obstructive pulmonary disease)   . Hyperlipemia   . History of pulmonary embolism   . History of breast cancer   . Abdominal distention 12/24/2013  . Dementia 11/19/2013  . PVD (peripheral vascular disease) 12/21/2012  . Atherosclerosis of lower extremity with intermittent claudication 12/21/2012  . HTN (hypertension) 09/25/2012  . Hyperglycemia 04/02/2012  . Anticoagulant long-term use 12/28/2011  . UTI (urinary tract infection) 12/22/2011  . Critical lower limb ischaemia 06/03/2011  . Tachycardia-bradycardia syndrome 12/01/2010  . Gait instability 11/04/2010  . HYPERTRIGLYCERIDEMIA 07/23/2010  . Unspecified peripheral vascular disease 12/04/2009  . DEPRESSION 11/05/2009  . BREAST PAIN, LEFT 10/30/2009  . JAUNDICE 10/30/2009  . INSOMNIA, CHRONIC 08/31/2009  . PROLAPSE OF VAGINAL VAULT AFTER HYSTERECTOMY 08/31/2009  . MEMORY LOSS 08/05/2009  . Aortic valve disorders 06/24/2009  . MITRAL VALVE REPLACEMENT, HX OF 06/24/2009  . PACEMAKER-[employer] 06/24/2009  . COPD 06/08/2009  . EPISTAXIS, RECURRENT 06/08/2009  . TOBACCO USE, QUIT 06/08/2009  . HYPOTHYROIDISM 05/25/2009  . HYPERLIPIDEMIA 05/25/2009  . ANEMIA-IRON DEFICIENCY 05/25/2009  . CORONARY ARTERY DISEASE 05/25/2009  . Atrial fibrillation 05/25/2009  . ASTHMA 05/25/2009  . OSTEOPOROSIS  05/25/2009  . BREAST CANCER, HX OF 05/25/2009  . CERVICAL CANCER, HX OF 05/25/2009  . CEREBROVASCULAR ACCIDENT, HX OF 05/25/2009  . PULMONARY EMBOLISM, HX OF 05/25/2009  . DIVERTICULITIS, HX OF 05/25/2009   Past Medical History:  Past Medical History  Diagnosis Date  . CAD (coronary artery disease)   . History of CVA (cerebrovascular accident)   . Atrial fibrillation     peristent  . Hyperlipemia   . COPD (chronic obstructive pulmonary disease)   . Epistaxis     recurrent  . Osteoporosis   . Hypothyroidism   . History of pulmonary embolism   . History of colonic diverticulitis   . History of breast cancer   . Asthma   . Iron deficiency anemia   . History of transfusion of whole blood   . LBBB (left bundle branch block)     history of  . Aortal stenosis     severe  . Pacemaker     bradycardia s/p pacemaker implant  . PAD (peripheral artery disease)     left leg occluded viabahn stents x 3 left SFA  . Tachycardia-bradycardia syndrome     s/p PPM  . Cancer   . Arthritis   . Stroke   . HTN (hypertension) 09/25/2012  . HTN (hypertension) 09/25/2012  . Esophageal reflux 01/26/2014  . Left-sided visual neglect 03/03/2014   Past Surgical History:  Past Surgical History  Procedure Laterality Date  . Thyroidectomy      due to cancer  . Total hip arthroplasty    . Abdominal hysterectomy    . Mitral valve repair    . Intraocular lens insertion      left eye implant  . Pacemaker placement      for tachy/brady syndrome  .  Mastectomy     HPI:  80 y.o. female with PMH of Right MCA CVA in 6/15, Afib, dementia, Aortic stenosis, CAD, COPD, tachy-brady syndrome s/p Pacemaker, GERD, h/o breast CA admitted for left sided weakness.  CXR COPD and chronic scarring in the peripheral right upper lung. No definite acute cardiopulmonary disease.  CT revealed Acute or subacute extension of the right middle cerebral artery distribution stroke in the right frontal lobe and basal ganglia, new  since the 01/07/2014 examination, without associated hemorrhage or hematoma.  Per MD notes, pt. seen by Palliative care last month and recommendations made for Palliative care FU at SNF.   Assessment / Plan / Recommendation Clinical Impression  Pt. demonstrated moderate dysarthria affecting her respiratory and phonatory processes resulting in decreased breath support and decreased vocal intensity.  Treatment strategies for enhancement of intensity, pausing and over-articulation were effective given moderate verbal/visual cues.  Spatial and situational orientation requires max cues with choices with inaccurate responses and decreased intellectual, anticipatory awareness and problem solving.  Daughter reports cognition continues to decline.  ST will follow while in hospital.    SLP Assessment  Patient needs continued Speech Lanaguage Pathology Services    Follow Up Recommendations  Skilled Nursing facility    Frequency and Duration min 2x/week  2 weeks   Pertinent Vitals/Pain WDL   SLP Goals  SLP Goals Potential to Achieve Goals:  (fair-good) Potential Considerations: Ability to learn/carryover information;Previous level of function  SLP Evaluation Prior Functioning  Cognitive/Linguistic Baseline: Baseline deficits Baseline deficit details:  (dysarthria, orientation, awareness) Type of Home: [place] Available Help at Discharge: [place]  Overall Cognitive Status: Impaired/Different from baseline Arousal/Alertness:  (awake but drowsy) Orientation Level: Oriented to person;Disoriented to place;Oriented to situation Attention: Sustained Sustained Attention: Impaired Sustained Attention Impairment: Verbal basic Memory: Impaired Memory Impairment: Prospective memory;Decreased recall of new information;Retrieval deficit;Decreased short term memory Awareness: Impaired Awareness Impairment: Intellectual impairment;Emergent  impairment;Anticipatory impairment Problem Solving: Impaired Problem Solving Impairment: Verbal basic;Functional basic Safety/Judgment: Impaired    Comprehension  Auditory Comprehension Overall Auditory Comprehension: Appears within functional limits for tasks assessed Yes/No Questions: Within Functional Limits Commands: Impaired Two Step Basic Commands: 50-74% accurate (likely due to decr attention) Conversation: Simple Interfering Components: Processing speed EffectiveTechniques: Extra processing time Visual Recognition/Discrimination Discrimination: Not tested Reading Comprehension Reading Status: Not tested    Expression Expression Primary Mode of Expression: Verbal Verbal Expression Overall Verbal Expression: Appears within functional limits for tasks assessed Initiation: No impairment Level of Generative/Spontaneous Verbalization: Sentence Repetition:  (NT) Naming: No impairment Pragmatics: No impairment Interfering Components: Attention Written Expression Dominant Hand: Right Written Expression: Not tested   Oral / Motor Oral Motor/Sensory Function Overall Oral Motor/Sensory Function:  (generalized weakness, decr ROM) Motor Speech Overall Motor Speech: Impaired Respiration: Impaired Level of Impairment: Conversation Phonation: Hoarse;Low vocal intensity Resonance: Within functional limits Articulation: Within functional limitis Intelligibility: Intelligibility reduced Word: 75-100% accurate Phrase: 50-74% accurate Sentence: 25-49% accurate Motor Planning: Witnin functional limits Effective Techniques: Slow rate;Increased vocal intensity;Over-articulate;Pause   GO     Orbie Pyo Marketia Stallsmith M.Ed [employer] [phone]  03/04/2014

## 2014-03-04 NOTE — Progress Notes (Signed)
Stroke Team Progress Note  HISTORY Rita Miller is a 80 y.o. female with a history of previous right MCA stroke secondary to atrial fibrillation who apparently was moving her left side fairly well the day prior to admission, but was not seen to be moving much at all the day of admission. She has a history of left MCA stroke, and is noted that she had 3/5 weakness of her left arm during her previous admission  She was referred to the ED where a CT was performed which shows extension of her previous infarct.  LKW: 7/26 Unclear  tpa given?: no, outside of window . She was admitted to the neuro step down bed for further evaluation and treatment.  SUBJECTIVE Her RN is at the bedside.  Overall she feels her condition is gradually worsening.  She continues to have dense left hemiplegia without improvement. Her voice is very soft and she can be understood with some difficulty. Speech therapy saw her this morning and put her on a dysphagia diet  OBJECTIVE Most recent Vital Signs: Filed Vitals:   03/04/14 0400 03/04/14 0700 03/04/14 0806 03/04/14 0900  BP: 152/78 171/65 174/68 133/44  Pulse: 91 89 98 79  Temp: 97.6 F (36.4 C)  97.4 F (36.3 C)   TempSrc: Oral  Oral   Resp:  24 27 18   Height:      Weight:      SpO2: 96% 97% 96% 97%   CBG (last 3)  No results found for this basename: GLUCAP,  in the last 72 hours  IV Fluid Intake:     MEDICATIONS  . [START ON 03/05/2014] aspirin  300 mg Rectal Daily  .  ceFAZolin (ANCEF) IV  1 g Intravenous Q12H  . famotidine (PEPCID) IV  20 mg Intravenous Daily  . fluticasone  2 puff Inhalation Daily  . levothyroxine  37.5 mcg Intravenous Daily  . tiotropium  18 mcg Inhalation Daily   PRN:  albuterol, metoprolol  Diet:  Dysphagia   Activity:  Bedrest    DVT Prophylaxis:  SCDs CLINICALLY SIGNIFICANT STUDIES Basic Metabolic Panel:  Recent Labs Lab 03/03/14 1420  NA 137  K 3.9  CL 101  CO2 23  GLUCOSE 176*  BUN 27*  CREATININE 1.10  CALCIUM  9.0   Liver Function Tests:  Recent Labs Lab 03/03/14 1420  AST 24  ALT 7  ALKPHOS 74  BILITOT 0.8  PROT 7.6  ALBUMIN 2.9*   CBC:  Recent Labs Lab 03/03/14 1420  WBC 9.2  NEUTROABS 8.0*  HGB 12.2  HCT 37.6  MCV 98.4  PLT 154   Coagulation:  Recent Labs Lab 03/03/14 1420  LABPROT 15.4*  INR 1.22   Cardiac Enzymes:  Recent Labs Lab 03/03/14 1420  TROPONINI <0.30   Urinalysis: No results found for this basename: COLORURINE, APPERANCEUR, LABSPEC, PHURINE, GLUCOSEU, HGBUR, BILIRUBINUR, KETONESUR, PROTEINUR, UROBILINOGEN, NITRITE, LEUKOCYTESUR,  in the last 168 hours Lipid Panel    Component Value Date/Time   CHOL 148 03/04/2014 0316   TRIG 79 03/04/2014 0316   HDL 58 03/04/2014 0316   CHOLHDL 2.6 03/04/2014 0316   VLDL 16 03/04/2014 0316   LDLCALC 74 03/04/2014 0316   HgbA1C  Lab Results  Component Value Date   HGBA1C 5.9* 01/08/2014    Urine Drug Screen:   No results found for this basename: labopia, cocainscrnur, labbenz, amphetmu, thcu, labbarb    Alcohol Level:  Recent Labs Lab 03/03/14 1420  ETH <11    Dg Chest 2  View  03/03/2014   CLINICAL DATA:  Stroke. History pulmonary embolism, hypertension, asthma, PAD  EXAM: CHEST  2 VIEW  COMPARISON:  Chest radiograph 01/07/2014, 01/07/2014, 5 04/27/2014, and chest CT 03/04/2011  FINDINGS: Patient rotated to the left. Stable cardiomegaly. Right chest wall dual lead pacer is stable. Heavy atherosclerotic calcification of the thoracic aorta noted. Chronic focal peripheral opacity in the right upper lung field, likely reflects a focal area of pulmonary scarring. There is mild left basilar atelectasis. Emphysematous changes are noted.  Right mastectomy right axillary nodal dissection.  Diffuse osteopenia.  IMPRESSION: COPD and chronic scarring in the peripheral right upper lung. No definite acute cardiopulmonary disease.  Cardiomegaly and thoracic aorta atherosclerosis.  Osteopenia.   Electronically Signed   By: Curlene Dolphin M.D.   On: 03/03/2014 22:02   Ct Head Wo Contrast  03/03/2014   CLINICAL DATA:  Acute mental status changes. Prior history of stroke.  EXAM: CT HEAD WITHOUT CONTRAST  TECHNIQUE: Contiguous axial images were obtained from the base of the skull through the vertex without intravenous contrast.  COMPARISON:  01/07/2014, 01/06/2014, 11/07/1009.  FINDINGS: Remote large right middle cerebral artery distribution stroke with encephalomalacia in the right temporal lobe, right frontal lobe and right parietal lobe. There is evidence of extension of the stroke to involve more of the right frontal cortex and the right basal ganglia since the most recent examination 01/07/2014. No associated hemorrhage or hematoma. Mild mass effect, with effacement of cortical sulci in the right frontal lobe and minimal shift of the midline to the left approximating 3 mm. No extra-axial fluid collections. Moderate to severe cortical and deep atrophy and moderate to severe cerebellar atrophy, unchanged. Severe changes of small vessel disease of the white matter diffusely, unchanged.  No skull fracture or other focal osseous abnormality involving the skull. Visualized paranasal sinuses, bilateral mastoid air cells and bilateral middle ear cavities well-aerated. Extensive bilateral carotid siphon and vertebral artery atherosclerosis.  IMPRESSION: 1. Acute or subacute extension of the right middle cerebral artery distribution stroke in the right frontal lobe and basal ganglia, new since the 01/07/2014 examination, without associated hemorrhage or hematoma. 2. Minimal mass effect with effacement of cortical sulci in the right frontal lobe and slight midline shift to the left approximating 3 mm. 3. Otherwise, stable encephalomalacia involving the right temporal, parietal and posterior frontal lobes related to the remote large MCA distribution stroke. 4. Stable moderate to severe generalized atrophy and severe chronic microvascular ischemic  changes of the white matter.   Electronically Signed   By: Evangeline Dakin M.D.   On: 03/03/2014 15:09        MRI of the brain  pacer  MRA of the brain  pacer  Carotid Doppler  pending 2D Echocardiogram  The estimated ejection fraction was 55%. Wall motion was normal; there were no regional wall motion abnormalities.  CXR  COPD and chronic scarring in the peripheral right upper lung. No  definite acute cardiopulmonary disease.  Cardiomegaly and thoracic aorta atherosclerosis.   EKG Atrial fibrillation with occasional ventricular-paced complexes and with premature ventricular or aberrantly conducted complexes ST & T wave abnormality, consider inferolateral ischemia  Therapy Recommendations pending Physical Exam   Frail cachectic malnourished elderly Caucasian lady not in distress.Awake alert. Afebrile. Head is nontraumatic. Neck is supple without bruit. Hearing is normal. Cardiac exam no murmur or gallop. Lungs are clear to auscultation. Distal pulses are well felt. Neurological Exam : Awake alert oriented to person only. Diminished attention, registration  and recall. Speech is hypophonic and very soft and at times difficult to understand. No aphasia. Extraocular movements show right gaze preference but can follow this up to midline but not all the way to the left. Blinks to threat on the right but does not do so on the left. Mild left lower facial weakness. Tongue midline. Weak cough and gag. Dense spastic left hemiplegia with only ability to bend fingers and toes minimally on the left. Purposeful antigravity strength on the right. Plantar upgoing right is downgoing. Diminished sensation on the left hemibody. Gait not tested ASSESSMENT Ms. Rita Miller is a 80 y.o. female presenting with worsening of existing left hemiparesis secondary to a extension of right MCA infarct from cardiogenic embolism from atrial fibrillation despite anticoagulation with apixaban   On eliquis (apixaban) prior  to admission. Now on eliquis (apixaban) for secondary stroke prevention. Patient with resultant  Dense left hemiplegia and field cut. Stroke work up underway.   Chronic atrial fibrillation on apaixaban  LDL 74 mg %on Pravastatin 20 mg  Baseline dementia on namenda  Old rt MCA infarct with lef themiparesis 2 years ago   Hospital day # 1  TREATMENT/PLAN  Continue   eliquis (apixaban) for secondary stroke prevention. But increase dose to 5 mg twice daily  Mobilize out of bed  Pt/OT/ST consults  Check CUS   I have personally examined this patient, reviewed notes, independently viewed imaging studies, participated in medical decision making and plan of care.  Antony Contras, MD Medical Director [place] Pager: [phone] 03/04/2014 1:04 PM      To contact Stroke Continuity provider, please refer to [email or web address] After hours, contact General Neurology

## 2014-03-04 NOTE — Progress Notes (Signed)
RN for 4N came to do stroke swallow screen on pt. Pt. Did not pass swallow screen. meds were not given, diet was not advanced. Will await SLP assessment. Will continue to monitor pt.

## 2014-03-04 NOTE — Evaluation (Signed)
Physical Therapy Evaluation Patient Details Name: Rita Miller MRN: [id number] DOB: 1929-08-25 Today's Date: 03/04/2014   History of Present Illness  80 y.o. female with PMH of Right MCA CVA in 6/15, Afib, dementia, Aortic stenosis, CAD, COPD, tachy-brady syndrome s/p Pacemaker, GERD, h/o breast CA admitted for left sided weakness.  CXR COPD and chronic scarring in the peripheral right upper lung. No definite acute cardiopulmonary disease.  CT revealed Acute or subacute extension of the right middle cerebral artery distribution stroke in the right frontal lobe and basal ganglia, new since the 01/07/2014 examination, without associated hemorrhage or hematoma.  Per MD notes, pt. seen by Palliative care last month and recommendations made for Palliative care FU at SNF.   Clinical Impression  Pt admitted with the above. Pt currently with functional limitations due to the deficits listed below (see PT Problem List). At the time of PT eval pt was able to transfer bed to chair with +2 total assist. Pt will benefit from skilled PT to increase their independence and safety with mobility to allow discharge to the venue listed below.       Follow Up Recommendations SNF;Supervision/Assistance - 24 hour    Equipment Recommendations  Rolling walker with 5" wheels;3in1 (PT)    Recommendations for Other Services       Precautions / Restrictions Precautions Precautions: Fall Precaution Comments: L sided hemiplegia Restrictions Weight Bearing Restrictions: No      Mobility  Bed Mobility Overal bed mobility: Needs Assistance;+2 for physical assistance Bed Mobility: Supine to Sit     Supine to sit: Mod assist;+2 for physical assistance     General bed mobility comments: Bed pad used to assist in scooting. Assist for LE movement and trunk elevation. Pt able to grab bed rail with RUE with cueing.   Transfers Overall transfer level: Needs assistance Equipment used: 2 person hand held  assist Transfers: Stand Pivot Transfers   Stand pivot transfers: Max assist;+2 physical assistance       General transfer comment: Use of gait belt and bed pad under hips for support. Pt with minimal participation during transfer.   Ambulation/Gait                Stairs            Wheelchair Mobility    Modified Rankin (Stroke Patients Only) Modified Rankin (Stroke Patients Only) Pre-Morbid Rankin Score: Moderately severe disability Modified Rankin: Severe disability     Balance Overall balance assessment: Needs assistance Sitting-balance support: Feet supported;Single extremity supported Sitting balance-Leahy Scale: Poor     Standing balance support: Bilateral upper extremity supported;During functional activity Standing balance-Leahy Scale: Zero                               Pertinent Vitals/Pain Vitals stable throughout session.     Home Living Family/patient expects to be discharged to:: Skilled nursing facility   Available Help at Discharge: [place] Type of Home: [place]                Prior Function           Comments: No family members present during session. Pt unable to provide any history.      Hand Dominance   Dominant Hand: Right    Extremity/Trunk Assessment   Upper Extremity Assessment: LUE deficits/detail       LUE Deficits / Details: Residual weakness. No grip strength or  active movement when tested.    Lower Extremity Assessment: Generalized weakness;LLE deficits/detail   LLE Deficits / Details: Residual weakness. Unable to MMT, however was able to show small AROM while sitting EOB in knee extensors.   Cervical / Trunk Assessment: Kyphotic  Communication   Communication: Expressive difficulties  Cognition Arousal/Alertness: Lethargic Behavior During Therapy: Flat affect Overall Cognitive Status: No family/caregiver present to determine baseline cognitive  functioning                      General Comments      Exercises General Exercises - Lower Extremity Long Arc Quad: 5 reps      Assessment/Plan    PT Assessment Patient needs continued PT services  PT Diagnosis Difficulty walking;Generalized weakness   PT Problem List Decreased strength;Decreased range of motion;Decreased activity tolerance;Decreased balance;Decreased mobility;Decreased knowledge of use of DME;Decreased safety awareness;Decreased knowledge of precautions;Cardiopulmonary status limiting activity  PT Treatment Interventions DME instruction;Stair training;Gait training;Functional mobility training;Therapeutic activities;Therapeutic exercise;Neuromuscular re-education;Patient/family education   PT Goals (Current goals can be found in the Care Plan section) Acute Rehab PT Goals Patient Stated Goal: None stated PT Goal Formulation: Patient unable to participate in goal setting Time For Goal Achievement: 03/18/14 Potential to Achieve Goals: Fair    Frequency Min 2X/week   Barriers to discharge        Co-evaluation               End of Session Equipment Utilized During Treatment: Gait belt;Oxygen Activity Tolerance: Patient limited by fatigue Patient left: in chair;with chair alarm set;with call bell/phone within reach Nurse Communication: Mobility status         Time: 1340-1415 PT Time Calculation (min): 35 min   Charges:   PT Evaluation $Initial PT Evaluation Tier I: 1 Procedure PT Treatments $Therapeutic Activity: 23-37 mins   PT G Codes:          Jolyn Lent 03/04/2014, 3:37 PM  Jolyn Lent, PT, DPT Acute Rehabilitation Services Pager: [phone]

## 2014-03-04 NOTE — Evaluation (Signed)
Clinical/Bedside Swallow Evaluation Patient Details  Name: Rita Miller MRN: [id number] Date of Birth: 25-Sep-1929  Today's Date: 03/04/2014 Time: 1025-1050 SLP Time Calculation (min): 25 min  Past Medical History:  Past Medical History  Diagnosis Date  . CAD (coronary artery disease)   . History of CVA (cerebrovascular accident)   . Atrial fibrillation     peristent  . Hyperlipemia   . COPD (chronic obstructive pulmonary disease)   . Epistaxis     recurrent  . Osteoporosis   . Hypothyroidism   . History of pulmonary embolism   . History of colonic diverticulitis   . History of breast cancer   . Asthma   . Iron deficiency anemia   . History of transfusion of whole blood   . LBBB (left bundle branch block)     history of  . Aortal stenosis     severe  . Pacemaker     bradycardia s/p pacemaker implant  . PAD (peripheral artery disease)     left leg occluded viabahn stents x 3 left SFA  . Tachycardia-bradycardia syndrome     s/p PPM  . Cancer   . Arthritis   . Stroke   . HTN (hypertension) 09/25/2012  . HTN (hypertension) 09/25/2012  . Esophageal reflux 01/26/2014  . Left-sided visual neglect 03/03/2014   Past Surgical History:  Past Surgical History  Procedure Laterality Date  . Thyroidectomy      due to cancer  . Total hip arthroplasty    . Abdominal hysterectomy    . Mitral valve repair    . Intraocular lens insertion      left eye implant  . Pacemaker placement      for tachy/brady syndrome  . Mastectomy     HPI:  80 y.o. female with PMH of Right MCA CVA in 6/15, Afib, dementia, Aortic stenosis, CAD, COPD, tachy-brady syndrome s/p Pacemaker, GERD, h/o breast CA admitted for left sided weakness.  CXR COPD and chronic scarring in the peripheral right upper lung. No definite acute cardiopulmonary disease.  CT revealed Acute or subacute extension of the right middle cerebral artery distribution stroke in the right frontal lobe and basal ganglia, new since the  01/07/2014 examination, without associated hemorrhage or hematoma.  Per MD notes, pt. seen by Palliative care last month and recommendations made for Palliative care FU at SNF.   Assessment / Plan / Recommendation Clinical Impression  Pt. appears safe for a Dys 1 diet texture and thin liquids via cup with daughter present during assessment.  Oral prep, manipulation and transit functional with thin and puree.  According to daughter pt.'s dentures do not fit and are not present in hospital. Possible delayed swallow initiation with thin.  Recommend puree diet (Dys 1 ) and thin liquid via cup, pills whole in applesauce, sit upright, small sips and meals only when alert.  ST will continue to follow.     Aspiration Risk  Moderate    Diet Recommendation Dysphagia 1 (Puree);Thin liquid   Liquid Administration via: Cup;No straw Medication Administration: Whole meds with puree Supervision: Full supervision/cueing for compensatory strategies;Staff to assist with self feeding Compensations: Slow rate;Small sips/bites Postural Changes and/or Swallow Maneuvers: Seated upright 90 degrees    Other  Recommendations Oral Care Recommendations: Oral care BID   Follow Up Recommendations   (TBD)    Frequency and Duration min 1 x/week  2 weeks   Pertinent Vitals/Pain WDL         Swallow Study  Oral/Motor/Sensory Function Overall Oral Motor/Sensory Function:  (generalized weakness, decr ROM)   Ice Chips Ice chips: Not tested   Thin Liquid Thin Liquid: Impaired Presentation: Cup;Straw Pharyngeal  Phase Impairments: Suspected delayed Swallow (suspect delay with thin only)    Nectar Thick Nectar Thick Liquid: Not tested   Honey Thick Honey Thick Liquid: Not tested   Puree Puree: Within functional limits   Solid   GO    Solid: Not tested       Houston Siren M.Ed [employer] [phone]  03/04/2014

## 2014-03-04 NOTE — Progress Notes (Signed)
INITIAL NUTRITION ASSESSMENT  DOCUMENTATION CODES Per approved criteria  -Severe malnutrition in the context of chronic illness -Underweight   INTERVENTION:  Chocolate Ensure Complete po BID, each supplement provides 350 kcal and 13 grams of protein RD to follow for nutrition care plan  NUTRITION DIAGNOSIS: Increased nutrient needs related to malnutrition and underweight status as evidenced by BMI of 17.6 and muscle wasting  Goal: Pt to meet >/= 90% of their estimated nutrition needs   Monitor:  PO & supplemental intake, weight, labs, I/O's  Reason for Assessment: Malnutrition Screening Tool Report  80 y.o. female  Admitting Dx: L hemiparesis   ASSESSMENT: 80 y.o. Female with PMH of right MCA CVA, Afib on apixaban, dementia, aortic stenosis, CAD, COPD, tachy-brady syndrome s/p pacemaker, h/o breast CA, resident of SNF was sent to [facility] ER by her PCP today to evaluate her L sided weakness.    RD unable to obtain nutrition hx from patient; no family at bedside; pt seen per Clinical Nutrition during previous hospitalization; pt with hx of poor PO intake; has had 8% weight loss x 2 months (severe for time frame); s/p bedside swallow evaluation this AM -- SLP recommending Dys 1, thin liquid diet; per RN, pt's daughter stated pt likes chocolate Ensure supplements; RD to order.  Palliative Care Team consulted.  Nutrition Focused Physical Exam:   Subcutaneous Fat:  Orbital Region: mild wasting  Upper Arm Region: mild wasting  Thoracic and Lumbar Region: N/A   Muscle:  Temple Region: severe wasting  Clavicle Bone Region: severe wasting  Clavicle and Acromion Bone Region: moderate wasting  Scapular Bone Region: N/A  Dorsal Hand: moderate wasting  Patellar Region: mild wasting  Anterior Thigh Region: mild wasting  Posterior Calf Region: mild wasting   Edema: none  Patient continues to meet criteria for severe malnutrition in the context of chronic illness as evidenced  by 8% weight loss x 2 months, severe muscle loss and severe subcutaneous fat loss.  Height: Ht Readings from Last 1 Encounters:  03/03/14 5' (1.524 m)    Weight: Wt Readings from Last 1 Encounters:  03/03/14 90 lb 3.2 oz (40.914 kg)    Ideal Body Weight: 100 lb  % Ideal Body Weight: 90%  Wt Readings from Last 10 Encounters:  03/03/14 90 lb 3.2 oz (40.914 kg)  02/28/14 91 lb (41.277 kg)  02/26/14 93 lb 1.3 oz (42.221 kg)  02/13/14 93 lb 4 oz (42.298 kg)  01/17/14 95 lb (43.092 kg)  01/07/14 98 lb 1.7 oz (44.5 kg)  01/03/14 98 lb (44.453 kg)  12/27/13 98 lb (44.453 kg)  12/19/13 98 lb 8 oz (44.679 kg)  11/26/13 102 lb 1.9 oz (46.321 kg)    Usual Body Weight: 98 lb -- May 2015  % Usual Body Weight: 92%  BMI:  Body mass index is 17.62 kg/(m^2).  Estimated Nutritional Needs: Kcal: 1200-1400 Protein: 60-70 gm Fluid: >/= 1.5 L  Skin: Intact  Diet Order: Dysphagia 1, thin liquids  EDUCATION NEEDS: -No education needs identified at this time  Labs:   Recent Labs Lab 03/03/14 1420  NA 137  K 3.9  CL 101  CO2 23  BUN 27*  CREATININE 1.10  CALCIUM 9.0  GLUCOSE 176*    CBG (last 3)  No results found for this basename: GLUCAP,  in the last 72 hours  Scheduled Meds: . apixaban  2.5 mg Oral BID  .  ceFAZolin (ANCEF) IV  1 g Intravenous Q12H  . famotidine (PEPCID) IV  20 mg Intravenous Daily  . fluticasone  2 puff Inhalation Daily  . levothyroxine  37.5 mcg Intravenous Daily  . memantine  10 mg Oral BID  . tiotropium  18 mcg Inhalation Daily    Continuous Infusions:   Past Medical History  Diagnosis Date  . CAD (coronary artery disease)   . History of CVA (cerebrovascular accident)   . Atrial fibrillation     peristent  . Hyperlipemia   . COPD (chronic obstructive pulmonary disease)   . Epistaxis     recurrent  . Osteoporosis   . Hypothyroidism   . History of pulmonary embolism   . History of colonic diverticulitis   . History of breast  cancer   . Asthma   . Iron deficiency anemia   . History of transfusion of whole blood   . LBBB (left bundle branch block)     history of  . Aortal stenosis     severe  . Pacemaker     bradycardia s/p pacemaker implant  . PAD (peripheral artery disease)     left leg occluded viabahn stents x 3 left SFA  . Tachycardia-bradycardia syndrome     s/p PPM  . Cancer   . Arthritis   . Stroke   . HTN (hypertension) 09/25/2012  . HTN (hypertension) 09/25/2012  . Esophageal reflux 01/26/2014  . Left-sided visual neglect 03/03/2014    Past Surgical History  Procedure Laterality Date  . Thyroidectomy      due to cancer  . Total hip arthroplasty    . Abdominal hysterectomy    . Mitral valve repair    . Intraocular lens insertion      left eye implant  . Pacemaker placement      for tachy/brady syndrome  . Mastectomy      Arthur Holms, RD, LDN Pager #: [phone] After-Hours Pager #: [phone]

## 2014-03-04 NOTE — Progress Notes (Signed)
[facility]  Rita Miller XTK:[id number] DOB: 1930/04/30 DOA: 03/03/2014 PCP: Penni Homans, MD   Brief narrative: 80 y.o. female with PMH of Right MCA CVA in 6/15, Afib on apixaban, Dementia, Aortic stenosis, CAD, COPD, tachy-brady syndrome s/p Pacemaker, h/o breast CA, resident of SNF was sent to [facility] ER by her PCP for evaluation of L sided weakness. Reportedly per her Aides at SNF she had been unable to use her L side, unlike the prior 24 hours. She was taken to see Dr.Blyth who then referred her to [facility].   [place] ER, CT head revealed acute or subacute extension of the right middle cerebral artery distribution stroke and upon discussion with [facility] and Neurology she was accepted to SDU at [facility].  Pt unable to give any reliable information to the admitting MD.   Of note, she was seen by Palliative care last month and made DNR and recommendations made for Palliative care FU at SNF.  HPI/Subjective: Pt sleepy and initially did not respond to my attempts to communicate with her but more responsive after her daughter arrived to bedside.  Assessment/Plan: Active Problems:   Extension of Stroke/Underlying Dementia -recent admit for CVA June 2015 -SLP rec D1 diet with specifications (see their note) -suspect was non ambulatory at SNF -appreciate Neuro assistance: cont ASA-initially rec hold anticoagulation. 7/28 Neuro recommends to CONTINUE Apixaban but to increase to BID dosing. PLEASE NOTE according to the patient's MAR from the nursing facility her Apixaban was discontinued on 6/26 and per package insert should not be used in patients with valvular heart disease and AF-will need to discuss further with attending and Cardiology/Neurology -ECHO; cardiomyopathy see results below -can resume dementia meds-PO meds had been converted to IV when failed bedside swallow screen-re convert other meds in am -per daughter: Hospice came to SNF to  evaluate but was told not enough info/inadequate documentation to support Hospice at SNF - have asked Palliative to see here with daughter's permission  Cardiomyopathy -Most likely secondary to patient's severe aortic stenosis, and pulmonary hypertension -Patient is a poor candidate for any type of cardiac surgery, we'll control risk factors. -BP within AHA guidelines continue metoprolol IV 5 PRN  Severe aortic stenosis -ECHO this admit demonstrates VERY severe AoS--Vmax 0.41 cm^2 -OP Cardiology notes reviewed ([place]) -not a surgical candidate and pt/family made aware this dx will ultimately lead to end of life -also Cards documented Apixaban with underlying valvular heart disease not best choice for anticoagulation agent  Pulmonary hypertension -Continue the metoprolol IV -If patient's BP increases would start Imdur to help with pulmonary hypertension  Atrial fibrillation -rate controlled -h/o TBS post pacer -prior Coumadin dc'd in favor of Apixaban due to difficulty maintaing safe INR -Given patient's poor medical condition and high fall risk and difficulty maintaining therapeutic INR would not restart Coumadin.  -Continue Eliquis 2.5 mg BID     History of pulmonary embolism -unable to locate actual date of PE but notes dating back to 2013 with "h/o PE" so suspect is now remote enough that anti coagulation related to this not indicated    Protein-calorie malnutrition, severe/FTT -ongoing and progressively worse -diet as per SLP recs-daughter says no PEG/feeding tube -Palliative to meet with daughter-suspect focus from this point forward should be on comfort    HYPOTHYROIDISM -cont Synthroid- IV today then if proves can take PO consistently then change back to PO route -TSH in April was normal  ANEMIA-IRON DEFICIENCY -Hgb stable and greater than baseline 11    HTN /moderate LVH -BP controlled -on CCB preadmit    COPD  -compensated and no wheezing     HYPERLIPIDEMIA    PVD (peripheral vascular disease) -feet cool at baseline    Loose stools -has been on anbx's/Macrobid for UTI -follow    History of UTI -recent start OP of Keflex for UTI (no cx obtained) -history enterococcus and E Coli in May -begin Ancef -check culture here for completeness   DVT prophylaxis: SCDs Code Status: DO NOT RESUSCITATE Family Communication: Spoke at length with patient's daughter Rita Miller regarding patient's current status and plans to pursue palliative evaluation to determine if hospice eligible at time of discharge; also discuss patient's current skilled nursing facility and daughter reiterates that needs to stay in the [place] area Disposition Plan/Expected LOS: Stepdown   Consultants: Neuro hospitalists Palliative medicine  Procedures: 7/27 CT head without contrast - Acute/ subacute extension right middle cerebral artery distribution stroke in right frontal lobe and basal ganglia - Minimal mass effect with effacement of cortical sulci in right frontal lobe and slight midline shift left  approximating 3 mm.  -stable encephalomalacia involving right temporal,parietal and posterior frontal lobes related to the remote large MCA distribution stroke.  -Stable moderate to severe generalized atrophy and severe chronic microvascular ischemic changes of the white matter. 2-D echocardiogram  - Left ventricle: moderate LVH. LVEF= 55%.  - Aortic valve: very severe aortic stenosis.  - Left atrium: severely dilated. - Right atrium: mildly dilated. - Pulmonary arteries: PA peak pressure: 42 mm Hg (S). - Impressions: No cardiac source of embolism was identified,    Carotid Dopplers pending  Cultures: Urine culture pending  Antibiotics: Keflex at skilled nursing facility start date unknown Ancef 7/28 >>>  Objective: Blood pressure 133/44, pulse 79, temperature 97.4 F (36.3 C), temperature source Oral, resp. rate 18, height 5' (1.524 m),  weight 90 lb 3.2 oz (40.914 kg), SpO2 97.00%. No intake or output data in the 24 hours ending 03/04/14 1223   Exam: Gen: No acute respiratory distress Chest: Clear to auscultation bilaterally without wheezes, rhonchi or crackles, room air Cardiac: Regular rate and rhythm, S1-S2, no rubs or gallops, loud grade 4/6 systolic murmur left sternal border, no peripheral edema, no JVD-diminished pulses feet Abdomen: Soft nontender nondistended without obvious hepatosplenomegaly, no ascites Extremities: Symmetrical in appearance without cyanosis, clubbing or effusion-feet notably cool to touch especially toes slightly blue discolored which according to daughter is her baseline  Scheduled Meds:  Scheduled Meds: . [START ON 03/05/2014] aspirin  300 mg Rectal Daily  .  ceFAZolin (ANCEF) IV  1 g Intravenous Q12H  . famotidine (PEPCID) IV  20 mg Intravenous Daily  . fluticasone  2 puff Inhalation Daily  . levothyroxine  37.5 mcg Intravenous Daily  . tiotropium  18 mcg Inhalation Daily   Continuous Infusions:   Data Reviewed: Basic Metabolic Panel:  Recent Labs Lab 03/03/14 1420  NA 137  K 3.9  CL 101  CO2 23  GLUCOSE 176*  BUN 27*  CREATININE 1.10  CALCIUM 9.0   Liver Function Tests:  Recent Labs Lab 03/03/14 1420  AST 24  ALT 7  ALKPHOS 74  BILITOT 0.8  PROT 7.6  ALBUMIN 2.9*   No results found for this basename: LIPASE, AMYLASE,  in the last 168 hours No results found for this basename: AMMONIA,  in the last 168 hours CBC:  Recent Labs Lab 03/03/14 1420  WBC  9.2  NEUTROABS 8.0*  HGB 12.2  HCT 37.6  MCV 98.4  PLT 154   Cardiac Enzymes:  Recent Labs Lab 03/03/14 1420  TROPONINI <0.30   BNP (last 3 results) No results found for this basename: PROBNP,  in the last 8760 hours CBG: No results found for this basename: GLUCAP,  in the last 168 hours  Recent Results (from the past 240 hour(s))  MRSA PCR SCREENING     Status: None   Collection Time    03/03/14   7:16 PM      Result Value Ref Range Status   MRSA by PCR NEGATIVE  NEGATIVE Final   Comment:            The GeneXpert MRSA Assay (FDA     approved for NASAL specimens     only), is one component of a     comprehensive MRSA colonization     surveillance program. It is not     intended to diagnose MRSA     infection nor to guide or     monitor treatment for     MRSA infections.     Studies:  Recent x-ray studies have been reviewed in detail by the Attending Physician  Time spent :      Erin Hearing, [place] [facility] Office  [phone] Pager [phone]   **If unable to reach the above provider after paging please contact the [place] @ [phone]  On-Call/Text Page:      [email or web address]      password [email or web address]  If 7PM-7AM, please contact night-coverage [email or web address] Password [email or web address] 03/04/2014, 12:23 PM   LOS: 1 day   Examined patient with ANP Ebony Hail and agree with assessment and plan Spoke at length with daughter regarding plan of care answer all questions Patient with multiple complex medical problems> 60 minutes was spent in direct patient care

## 2014-03-04 NOTE — Progress Notes (Signed)
  Echocardiogram 2D Echocardiogram has been performed.  Lauralye Kinn 03/04/2014, 10:28 AM

## 2014-03-05 ENCOUNTER — Inpatient Hospital Stay (HOSPITAL_COMMUNITY): Payer: Medicare Other

## 2014-03-05 LAB — CBC
HCT: 36.3 % (ref 36.0–46.0)
HEMOGLOBIN: 11.9 g/dL — AB (ref 12.0–15.0)
MCH: 31.5 pg (ref 26.0–34.0)
MCHC: 32.8 g/dL (ref 30.0–36.0)
MCV: 96 fL (ref 78.0–100.0)
Platelets: 162 10*3/uL (ref 150–400)
RBC: 3.78 MIL/uL — AB (ref 3.87–5.11)
RDW: 14.6 % (ref 11.5–15.5)
WBC: 7 10*3/uL (ref 4.0–10.5)

## 2014-03-05 LAB — BASIC METABOLIC PANEL
Anion gap: 15 (ref 5–15)
BUN: 28 mg/dL — ABNORMAL HIGH (ref 6–23)
CALCIUM: 8.5 mg/dL (ref 8.4–10.5)
CHLORIDE: 104 meq/L (ref 96–112)
CO2: 22 meq/L (ref 19–32)
Creatinine, Ser: 0.85 mg/dL (ref 0.50–1.10)
GFR calc Af Amer: 72 mL/min — ABNORMAL LOW (ref >90)
GFR calc non Af Amer: 63 mL/min — ABNORMAL LOW (ref >90)
GLUCOSE: 90 mg/dL (ref 70–99)
Potassium: 3.8 mEq/L (ref 3.7–5.3)
SODIUM: 141 meq/L (ref 137–147)

## 2014-03-05 MED ORDER — DOCUSATE SODIUM 50 MG/5ML PO LIQD
200.0000 mg | Freq: Two times a day (BID) | ORAL | Status: DC
  Administered 2014-03-05 – 2014-03-07 (×4): 200 mg via ORAL
  Filled 2014-03-05 (×5): qty 20

## 2014-03-05 MED ORDER — COUMADIN BOOK
Freq: Once | Status: AC
  Administered 2014-03-05: 16:00:00
  Filled 2014-03-05: qty 1

## 2014-03-05 MED ORDER — TRAMADOL 5 MG/ML ORAL SUSPENSION: 25.0000 mg | Freq: Four times a day (QID) | ORAL | Status: DC | PRN

## 2014-03-05 MED ORDER — BISACODYL 10 MG RE SUPP
10.0000 mg | Freq: Once | RECTAL | Status: AC
  Administered 2014-03-05: 10 mg via RECTAL
  Filled 2014-03-05: qty 1

## 2014-03-05 MED ORDER — WARFARIN - PHARMACIST DOSING INPATIENT: Freq: Every day | Status: DC

## 2014-03-05 MED ORDER — WARFARIN SODIUM 5 MG PO TABS
5.0000 mg | ORAL_TABLET | Freq: Once | ORAL | Status: AC
Start: 2014-03-05 — End: 2014-03-05
  Administered 2014-03-05: 5 mg via ORAL
  Filled 2014-03-05: qty 1

## 2014-03-05 MED ORDER — TRAMADOL HCL 50 MG PO TABS
25.0000 mg | ORAL_TABLET | Freq: Four times a day (QID) | ORAL | Status: DC | PRN
  Administered 2014-03-06: 25 mg via ORAL
  Filled 2014-03-05: qty 1

## 2014-03-05 MED ORDER — MAGNESIUM HYDROXIDE 400 MG/5ML PO SUSP
30.0000 mL | Freq: Once | ORAL | Status: AC
  Administered 2014-03-05: 30 mL via ORAL
  Filled 2014-03-05: qty 30

## 2014-03-05 NOTE — Progress Notes (Signed)
[facility]  Rita Miller RXV:[id number] DOB: 1930-04-08 DOA: 03/03/2014 PCP: Penni Homans, MD  Brief narrative: 80 y.o. female with PMH of Right MCA CVA in 6/15, Afib on apixaban, Dementia, Aortic stenosis, CAD, COPD, tachy-brady syndrome s/p Pacemaker, h/o breast CA, resident of SNF was sent to [facility] by her PCP for evaluation of L sided weakness. Reportedly per her Aides at SNF she had been unable to use her L side, unlike the prior 24 hours. She was taken to see Dr.Blyth who then referred her to [facility].   [facility], CT head revealed acute or subacute extension of the right middle cerebral artery distribution stroke and upon discussion with [facility] and Neurology she was accepted to SDU at [facility].  Pt unable to give any reliable information to the admitting MD.   Of note, she was seen by Palliative care last month and made DNR and recommendations made for Palliative care FU at SNF.  HPI/Subjective: Awake- no specific complaints- soft spoken and speech hindered by lack of dentition  Assessment/Plan:    Extension of Stroke / Underlying Dementia -recent admit for CVA June 2015 -SLP rec D1 diet with specifications (see their note) -appreciate Neuro assistance: cont ASA-7/28 Neuro recommended to CONTINUE Apixaban but to increase to BID dosing. PLEASE NOTE according to the patient's MAR from the nursing facility her Apixaban was discontinued on 6/26 and per package insert should not be used in patients with valvular heart disease and AF -after reviewing ALL the NOAC it was determined that NONE of these agents are approved for use in patients with valvular heart disease since there is no data to support their use therefore plan is to change to Coumadin (see below) -ECHO without obvious embolic source for CVA -can resume dementia meds-PO meds had been converted to IV when failed bedside swallow screen-re convert other meds in am -per  daughter: Hospice came to SNF to evaluate but was told not enough info/inadequate documentation to support Hospice at SNF - Appreciate Palliative assistance- daughter amenable to continued DNR status and confirmed no feeding tube but not ready for Hospice or comfort measures- note Palliative estimates pt has < 6 months based on current clinical picture  Severe aortic stenosis -ECHO this admit demonstrates VERY severe AoS--Vmax 0.41 cm^2 -OP Cardiology notes reviewed ([place]) -not a surgical candidate and pt/family made aware this dx will ultimately lead to end of life -Cards documented Apixaban with underlying valvular heart disease not best choice for anticoagulation agent  Pulmonary hypertension -If patient's BP increases would start Imdur to help with pulmonary hypertension  Atrial fibrillation -rate controlled -h/o TBS post pacer -Had extensive d/w pt's daughter [name] on 7/29 to discuss rationale for dc of NOAC-prior Coumadin dc'd in favor of Apixaban due to difficulty obtaining INR due to pt poor venous access- dtr agreeable to resuming Coumadin and says SNF DOES NOT draw labs or manage INR but PCP Dr. Charlett Blake did previously so at DC that office needs to be notified of need for follow up -pharmacy to dose Coumadin    Abdominal pain -bladder scan with <150 cc urine -suspected constipation so checked KUB-significant stool noted so will give 1x Dulcolax supp and MOM and begin BID Colace liquid -daughter refuses Tylenol and since concern over constipation will not give narcs -Begin Ultram suspension 25 mg prn q 6 hrs    History of pulmonary embolism -unable to locate actual date of PE  but notes dating back to 2013 with "h/o PE" so suspect is now remote enough that anti coagulation related to this not indicated    Protein-calorie malnutrition, severe/FTT -ongoing and progressively worse -diet as per SLP recs-daughter says no PEG/feeding tube -Palliative met with daughter (see above)      HYPOTHYROIDISM -cont Synthroid -TSH in April was normal    ANEMIA-IRON DEFICIENCY -Hgb stable and greater than baseline 11    HTN /moderate LVH -BP controlled -on CCB preadmit    COPD  -compensated and no wheezing    HYPERLIPIDEMIA    Peripheral vascular disease -feet cool at baseline    History of UTI -recent start OP of Keflex for UTI (no cx obtained) -history enterococcus and E Coli in May -begin Ancef -check culture here for completeness  DVT prophylaxis: SCDs Code Status: DO NOT RESUSCITATE Family Communication: Spoke at length with patient's daughter [name] (see above) Disposition Plan/Expected LOS: Transfer to Telemetry  Consultants: Neuro hospitalists Palliative medicine  Procedures: 7/27 CT head without contrast - Acute/ subacute extension right middle cerebral artery distribution stroke in right frontal lobe and basal ganglia - Minimal mass effect with effacement of cortical sulci in right frontal lobe and slight midline shift left  approximating 3 mm.  -stable encephalomalacia involving right temporal,parietal and posterior frontal lobes related to the remote large MCA distribution stroke.  -Stable moderate to severe generalized atrophy and severe chronic microvascular ischemic changes of the white matter.  2-D echocardiogram  - Left ventricle: moderate LVH. LVEF= 55%.  - Aortic valve: very severe aortic stenosis.  - Left atrium: severely dilated. - Right atrium: mildly dilated. - Pulmonary arteries: PA peak pressure: 42 mm Hg (S). - Impressions: No cardiac source of embolism was identified,   Carotid Dopplers  -normal  Cultures: Urine culture pending  Antibiotics: Keflex at skilled nursing facility start date unknown Ancef 7/28 >>>  Objective: Blood pressure 155/65, pulse 85, temperature 98.2 F (36.8 C), temperature source Oral, resp. rate 21, height 5' (1.524 m), weight 90 lb 2.7 oz (40.9 kg), SpO2 97.00%.  Intake/Output Summary  (Last 24 hours) at 03/05/14 1255 Last data filed at 03/05/14 1100  Gross per 24 hour  Intake     50 ml  Output    350 ml  Net   -300 ml    Exam: Gen: No acute respiratory distress Chest: Clear to auscultation bilaterally, room air Cardiac: Regular rate - no rubs or gallops, 3/6 systolic murmur left sternal border, no peripheral edema-diminished pulses feet Abdomen: Soft nontender nondistended without obvious hepatosplenomegaly, no ascites Extremities: without cyanosis, clubbing or effusion-feet notably cool to touch especially toes slightly blue discolored which according to daughter is her baseline Neuro: Awake but confused, note left hemiplegia arm weaker than leg  Scheduled Meds:  Scheduled Meds: . antiseptic oral rinse  15 mL Mouth Rinse q12n4p  .  ceFAZolin (ANCEF) IV  1 g Intravenous Q12H  . chlorhexidine  15 mL Mouth Rinse BID  . famotidine (PEPCID) IV  20 mg Intravenous Daily  . feeding supplement (ENSURE COMPLETE)  237 mL Oral BID BM  . fluticasone  2 puff Inhalation Daily  . levothyroxine  37.5 mcg Intravenous Daily  . memantine  10 mg Oral BID  . Rivastigmine  13.3 mg Transdermal Q24H  . tiotropium  18 mcg Inhalation Daily  . warfarin  5 mg Oral ONCE-1800  . Warfarin - Pharmacist Dosing Inpatient   Does not apply q1800   Data Reviewed: Basic Metabolic Panel:  Recent Labs Lab 03/03/14 1420 03/05/14 0343  NA 137 141  K 3.9 3.8  CL 101 104  CO2 23 22  GLUCOSE 176* 90  BUN 27* 28*  CREATININE 1.10 0.85  CALCIUM 9.0 8.5   Liver Function Tests:  Recent Labs Lab 03/03/14 1420  AST 24  ALT 7  ALKPHOS 74  BILITOT 0.8  PROT 7.6  ALBUMIN 2.9*   CBC:  Recent Labs Lab 03/03/14 1420 03/05/14 0343  WBC 9.2 7.0  NEUTROABS 8.0*  --   HGB 12.2 11.9*  HCT 37.6 36.3  MCV 98.4 96.0  PLT 154 162   Cardiac Enzymes:  Recent Labs Lab 03/03/14 1420  TROPONINI <0.30    Recent Results (from the past 240 hour(s))  MRSA PCR SCREENING     Status: None    Collection Time    03/03/14  7:16 PM      Result Value Ref Range Status   MRSA by PCR NEGATIVE  NEGATIVE Final   Comment:            The GeneXpert MRSA Assay (FDA     approved for NASAL specimens     only), is one component of a     comprehensive MRSA colonization     surveillance program. It is not     intended to diagnose MRSA     infection nor to guide or     monitor treatment for     MRSA infections.     Studies:  Recent x-ray studies have been reviewed in detail by the Attending Physician  Time spent :  83mns      Allison Ellis, ANP [facility] Office  [phone] [phone]  **If unable to reach the above provider after paging please contact the [place]@ [phone] On-Call/Text Page:      [email or web address]      password [email or web address]  If 7PM-7AM, please contact night-coverage [email or web address] Password [email or web address] 03/05/2014, 12:55 PM   LOS: 2 days   I have personally examined this patient and reviewed the entire database. I have reviewed the above note, made any necessary editorial changes, and agree with its content.  JCherene Altes MD [facility]

## 2014-03-05 NOTE — Progress Notes (Signed)
Pt arrived to the floor via bed at 1745. Pt Alert and verbally responsive; pt oriented to the room and floor; vitals taken; placed on telemetry; IV intact; pt call light within reach and soft touch call light ordered for pt. Pt daughter in to visit pt and at bedside. Will continue to monitor pt quietly. Francis Gaines Letty Salvi RN.

## 2014-03-05 NOTE — Progress Notes (Signed)
ANTICOAGULATION CONSULT NOTE - Initial Consult  Pharmacy Consult for coumadin Indication: afib with severe aortic stenosis  Allergies  Allergen Reactions  . Donepezil Hydrochloride     REACTION: heart fluttering  . Morphine Sulfate   . Oxycodone-Acetaminophen     REACTION: Hallucinations    Patient Measurements: Height: 5' (152.4 cm) Weight: 90 lb 2.7 oz (40.9 kg) IBW/kg (Calculated) : 45.5  Vital Signs: Temp: 98.2 F (36.8 C) (07/29 0905) Temp src: Oral (07/29 0905) BP: 155/65 mmHg (07/29 0905) Pulse Rate: 85 (07/29 0905)  Labs:  Recent Labs  03/03/14 1420 03/05/14 0343  HGB 12.2 11.9*  HCT 37.6 36.3  PLT 154 162  APTT 37  --   LABPROT 15.4*  --   INR 1.22  --   CREATININE 1.10 0.85  TROPONINI <0.30  --     Estimated Creatinine Clearance: 33.5 ml/min (by C-G formula based on Cr of 0.85).   Medical History: Past Medical History  Diagnosis Date  . CAD (coronary artery disease)   . History of CVA (cerebrovascular accident)   . Atrial fibrillation     peristent  . Hyperlipemia   . COPD (chronic obstructive pulmonary disease)   . Epistaxis     recurrent  . Osteoporosis   . Hypothyroidism   . History of pulmonary embolism   . History of colonic diverticulitis   . History of breast cancer   . Asthma   . Iron deficiency anemia   . History of transfusion of whole blood   . LBBB (left bundle branch block)     history of  . Aortal stenosis     severe  . Pacemaker     bradycardia s/p pacemaker implant  . PAD (peripheral artery disease)     left leg occluded viabahn stents x 3 left SFA  . Tachycardia-bradycardia syndrome     s/p PPM  . Cancer   . Arthritis   . Stroke   . HTN (hypertension) 09/25/2012  . HTN (hypertension) 09/25/2012  . Esophageal reflux 01/26/2014  . Left-sided visual neglect 03/03/2014    Medications:  Scheduled:  . antiseptic oral rinse  15 mL Mouth Rinse q12n4p  .  ceFAZolin (ANCEF) IV  1 g Intravenous Q12H  . chlorhexidine   15 mL Mouth Rinse BID  . famotidine (PEPCID) IV  20 mg Intravenous Daily  . feeding supplement (ENSURE COMPLETE)  237 mL Oral BID BM  . fluticasone  2 puff Inhalation Daily  . levothyroxine  37.5 mcg Intravenous Daily  . memantine  10 mg Oral BID  . Rivastigmine  13.3 mg Transdermal Q24H  . tiotropium  18 mcg Inhalation Daily    Assessment: 80 yo female here with with extension a right MCA infarct and with afib and severe aortic stenosis on apixiban and to transition to coumadin. INR= 1.22 on 7/27.  Goal of Therapy:  INR 2-3 Monitor platelets by anticoagulation protocol: Yes   Plan:  -Coumadin 5mg  po today -Dailly PT/INR  Hildred Laser, Pharm D 03/05/2014 11:33 AM

## 2014-03-05 NOTE — Progress Notes (Signed)
RT changed ATC set up and reduced Fi02 to 28% - RN aware.

## 2014-03-05 NOTE — Progress Notes (Signed)
Speech Language Pathology Treatment: Cognitive-Linquistic  Patient Details Name: Rita Miller MRN: [id number] DOB: 1929/09/16 Today's Date: 03/05/2014 Time: 1683-7290 SLP Time Calculation (min): 18 min  Assessment / Plan / Recommendation Clinical Impression  Pt was seen for speech/cognitive treatment this afternoon. Pt consumed one sip of thin liquid, however further PO trials were held due to positioning. Pt was internally distracted by need to have a bowel movement despite Max cues from SLP, and therefore was reclined in bed and placed on the bed pan. Sip of thin liquid appeared to thin secretions that had been in pt's oropharynx, and allowed SLP to facilitate oral clearance with pt expectoration as well as oral suction. Pt's speech appeared to be increasingly intelligible today as compared to SLE 7/28, with environmental adjustments made and Min cues provided to increase vocal intensity. Recommend to f/u tomorrow to further assess diet tolerance - for today she remains afebrile with lung sounds unchanged.   HPI HPI: 80 y.o. female with PMH of Right MCA CVA in 6/15, Afib, dementia, Aortic stenosis, CAD, COPD, tachy-brady syndrome s/p Pacemaker, GERD, h/o breast CA admitted for left sided weakness.  CXR COPD and chronic scarring in the peripheral right upper lung. No definite acute cardiopulmonary disease.  CT revealed Acute or subacute extension of the right middle cerebral artery distribution stroke in the right frontal lobe and basal ganglia, new since the 01/07/2014 examination, without associated hemorrhage or hematoma.  Per MD notes, pt. seen by Palliative care last month and recommendations made for Palliative care FU at SNF.   Pertinent Vitals n/a  SLP Plan  Continue with current plan of care    Recommendations Diet recommendations: Dysphagia 1 (puree);Thin liquid Liquids provided via: Cup;No straw Medication Administration: Whole meds with puree Supervision: Full supervision/cueing for  compensatory strategies;Staff to assist with self feeding Compensations: Slow rate;Small sips/bites Postural Changes and/or Swallow Maneuvers: Seated upright 90 degrees              Oral Care Recommendations: Oral care BID Follow up Recommendations: Skilled Nursing facility Plan: Continue with current plan of care    GO       Germain Osgood, M.A. CCC-SLP [phone]  Germain Osgood 03/05/2014, 5:01 PM

## 2014-03-05 NOTE — Progress Notes (Signed)
Rita Miller is lying in bed today resting when I enter. She smiles at me but is complaining about her back hurting and that the bed is uncomfortable. I offer a heating pad to help and Rita Miller agrees this might help her feel better. [name] is not at bedside today.   [name], NP Palliative Medicine Team Pager # [phone] (M-F 8a-5p) Team Phone # [phone] (Nights/Weekends)

## 2014-03-05 NOTE — Progress Notes (Signed)
Stroke Team Progress Note  HISTORY Rita Miller is a 80 y.o. female with a history of previous right MCA stroke secondary to atrial fibrillation who apparently was moving her left side fairly well the day prior to admission, but was not seen to be moving much at all the day of admission. She has a history of left MCA stroke, and is noted that she had 3/5 weakness of her left arm during her previous admission  She was referred to the ED where a CT was performed which shows extension of her previous infarct.  LKW: 7/26 Unclear  tpa given?: no, outside of window . She was admitted to the neuro step down bed for further evaluation and treatment.  SUBJECTIVE  Overall she feels her condition is gradually worsening.  She continues to have dense left hemiplegia without improvement. Her voice is very soft and she can be understood with some difficulty. Carotid dopplers and 2DEcho are both unremarkable. OBJECTIVE Most recent Vital Signs: Filed Vitals:   03/05/14 0000 03/05/14 0400 03/05/14 0905 03/05/14 1601  BP: 155/69 153/74 155/65 148/77  Pulse: 93 86 85 86  Temp: 98.2 F (36.8 C) 98.2 F (36.8 C) 98.2 F (36.8 C) 98.2 F (36.8 C)  TempSrc: Oral Oral Oral Oral  Resp: 28 22 21 18   Height:      Weight:  40.9 kg (90 lb 2.7 oz)    SpO2: 96% 97% 97% 99%   CBG (last 3)  No results found for this basename: GLUCAP,  in the last 72 hours  IV Fluid Intake:     MEDICATIONS  . antiseptic oral rinse  15 mL Mouth Rinse q12n4p  .  ceFAZolin (ANCEF) IV  1 g Intravenous Q12H  . chlorhexidine  15 mL Mouth Rinse BID  . docusate  200 mg Oral BID  . famotidine (PEPCID) IV  20 mg Intravenous Daily  . feeding supplement (ENSURE COMPLETE)  237 mL Oral BID BM  . fluticasone  2 puff Inhalation Daily  . levothyroxine  37.5 mcg Intravenous Daily  . memantine  10 mg Oral BID  . Rivastigmine  13.3 mg Transdermal Q24H  . tiotropium  18 mcg Inhalation Daily  . warfarin  5 mg Oral ONCE-1800  . Warfarin -  Pharmacist Dosing Inpatient   Does not apply q1800   PRN:  albuterol, metoprolol, traMADol  Diet:  Dysphagia   Activity:  Bedrest    DVT Prophylaxis:  SCDs CLINICALLY SIGNIFICANT STUDIES Basic Metabolic Panel:   Recent Labs Lab 03/03/14 1420 03/05/14 0343  NA 137 141  K 3.9 3.8  CL 101 104  CO2 23 22  GLUCOSE 176* 90  BUN 27* 28*  CREATININE 1.10 0.85  CALCIUM 9.0 8.5   Liver Function Tests:   Recent Labs Lab 03/03/14 1420  AST 24  ALT 7  ALKPHOS 74  BILITOT 0.8  PROT 7.6  ALBUMIN 2.9*   CBC:   Recent Labs Lab 03/03/14 1420 03/05/14 0343  WBC 9.2 7.0  NEUTROABS 8.0*  --   HGB 12.2 11.9*  HCT 37.6 36.3  MCV 98.4 96.0  PLT 154 162   Coagulation:   Recent Labs Lab 03/03/14 1420  LABPROT 15.4*  INR 1.22   Cardiac Enzymes:   Recent Labs Lab 03/03/14 1420  TROPONINI <0.30   Urinalysis: No results found for this basename: COLORURINE, APPERANCEUR, LABSPEC, PHURINE, GLUCOSEU, HGBUR, BILIRUBINUR, KETONESUR, PROTEINUR, UROBILINOGEN, NITRITE, LEUKOCYTESUR,  in the last 168 hours Lipid Panel    Component Value Date/Time  CHOL 148 03/04/2014 0316   TRIG 79 03/04/2014 0316   HDL 58 03/04/2014 0316   CHOLHDL 2.6 03/04/2014 0316   VLDL 16 03/04/2014 0316   LDLCALC 74 03/04/2014 0316   HgbA1C  Lab Results  Component Value Date   HGBA1C 5.5 03/04/2014    Urine Drug Screen:   No results found for this basename: labopia,  cocainscrnur,  labbenz,  amphetmu,  thcu,  labbarb    Alcohol Level:   Recent Labs Lab 03/03/14 1420  ETH <11    Dg Chest 2 View  03/03/2014   CLINICAL DATA:  Stroke. History pulmonary embolism, hypertension, asthma, PAD  EXAM: CHEST  2 VIEW  COMPARISON:  Chest radiograph 01/07/2014, 01/07/2014, 5 04/27/2014, and chest CT 03/04/2011  FINDINGS: Patient rotated to the left. Stable cardiomegaly. Right chest wall dual lead pacer is stable. Heavy atherosclerotic calcification of the thoracic aorta noted. Chronic focal peripheral opacity in  the right upper lung field, likely reflects a focal area of pulmonary scarring. There is mild left basilar atelectasis. Emphysematous changes are noted.  Right mastectomy right axillary nodal dissection.  Diffuse osteopenia.  IMPRESSION: COPD and chronic scarring in the peripheral right upper lung. No definite acute cardiopulmonary disease.  Cardiomegaly and thoracic aorta atherosclerosis.  Osteopenia.   Electronically Signed   By: Curlene Dolphin M.D.   On: 03/03/2014 22:02   Dg Abd Portable 1v  03/05/2014   CLINICAL DATA:  Diarrhea and pain.  EXAM: PORTABLE ABDOMEN - 1 VIEW  COMPARISON:  CT 01/20/2014.  FINDINGS: Soft tissue structures are unremarkable. Gas pattern is nonspecific. Stool noted throughout the colon. No free air. Aortoiliac atherosclerotic vascular disease. Left iliac stent. Degenerative changes lumbar spine. Postsurgical changes both hips. Calcified injection granulomas.  IMPRESSION: Nonspecific exam. No evidence of significant bowel distention. No free air. Stool is present in the colon.   Electronically Signed   By: Marcello Moores  Register   On: 03/05/2014 14:43        MRI of the brain  pacer  MRA of the brain  pacer  Carotid Doppler  pending 2D Echocardiogram  The estimated ejection fraction was 55%. Wall motion was normal; there were no regional wall motion abnormalities.  CXR  COPD and chronic scarring in the peripheral right upper lung. No  definite acute cardiopulmonary disease.  Cardiomegaly and thoracic aorta atherosclerosis.   EKG Atrial fibrillation with occasional ventricular-paced complexes and with premature ventricular or aberrantly conducted complexes ST & T wave abnormality, consider inferolateral ischemia  Therapy Recommendations pending Physical Exam   Frail cachectic malnourished elderly Caucasian lady not in distress.Awake alert. Afebrile. Head is nontraumatic. Neck is supple without bruit. Hearing is normal. Cardiac exam no murmur or gallop. Lungs are clear to  auscultation. Distal pulses are well felt. Neurological Exam : Awake alert oriented to person only. Diminished attention, registration and recall. Speech is hypophonic and very soft and at times difficult to understand. No aphasia. Extraocular movements show right gaze preference but can follow this up to midline but not all the way to the left. Blinks to threat on the right but does not do so on the left. Mild left lower facial weakness. Tongue midline. Weak cough and gag. Dense spastic left hemiplegia with only ability to bend fingers and toes minimally on the left. Purposeful antigravity strength on the right. Plantar upgoing right is downgoing. Diminished sensation on the left hemibody. Gait not tested ASSESSMENT Ms. Rita Miller is a 80 y.o. female presenting with worsening of existing left  hemiparesis secondary to a extension of right MCA infarct from cardiogenic embolism from atrial fibrillation despite anticoagulation with apixaban   On eliquis (apixaban) prior to admission. Now on eliquis (apixaban) for secondary stroke prevention. Patient with resultant  Dense left hemiplegia and field cut. Stroke work up underway.   Chronic atrial fibrillation on apaixaban  LDL 74 mg %on Pravastatin 20 mg  Baseline dementia on namenda  Old rt MCA infarct with lef themiparesis 2 years ago   Hospital day # 2  TREATMENT/PLAN  Continue   eliquis (apixaban) for secondary stroke prevention. Dose decrease back to  2.5 mg twice daily as she was apparently off it for few days prior to present admission per pharmacy.  Mobilize out of bed Stroke team will sign off. Call for questions.   Antony Contras, MD Medical Director [facility] Pager: [phone] 03/05/2014 4:19 PM      To contact Stroke Continuity provider, please refer to [email or web address] After hours, contact General Neurology

## 2014-03-06 DIAGNOSIS — D509 Iron deficiency anemia, unspecified: Secondary | ICD-10-CM

## 2014-03-06 DIAGNOSIS — E039 Hypothyroidism, unspecified: Secondary | ICD-10-CM

## 2014-03-06 DIAGNOSIS — Z87440 Personal history of urinary (tract) infections: Secondary | ICD-10-CM

## 2014-03-06 LAB — URINE CULTURE
Colony Count: NO GROWTH
Culture: NO GROWTH

## 2014-03-06 LAB — PROTIME-INR
INR: 1.54 — ABNORMAL HIGH (ref 0.00–1.49)
Prothrombin Time: 18.5 seconds — ABNORMAL HIGH (ref 11.6–15.2)

## 2014-03-06 LAB — HEPARIN LEVEL (UNFRACTIONATED): HEPARIN UNFRACTIONATED: 0.82 [IU]/mL — AB (ref 0.30–0.70)

## 2014-03-06 LAB — APTT: aPTT: 55 seconds — ABNORMAL HIGH (ref 24–37)

## 2014-03-06 MED ORDER — TRAMADOL HCL 50 MG PO TABS: 25.0000 mg | ORAL_TABLET | Freq: Four times a day (QID) | ORAL | Status: DC | PRN

## 2014-03-06 MED ORDER — TRAMADOL 5 MG/ML ORAL SUSPENSION: 12.5000 mg | Freq: Four times a day (QID) | ORAL | Status: DC | PRN

## 2014-03-06 MED ORDER — WARFARIN SODIUM 5 MG PO TABS
5.0000 mg | ORAL_TABLET | Freq: Once | ORAL | Status: AC
Start: 2014-03-06 — End: 2014-03-06
  Administered 2014-03-06: 5 mg via ORAL
  Filled 2014-03-06: qty 1

## 2014-03-06 MED ORDER — HEPARIN (PORCINE) IN NACL 100-0.45 UNIT/ML-% IJ SOLN
700.0000 [IU]/h | INTRAMUSCULAR | Status: DC
  Administered 2014-03-06: 600 [IU]/h via INTRAVENOUS
  Administered 2014-03-06: 550 [IU]/h via INTRAVENOUS
  Administered 2014-03-07: 700 [IU]/h via INTRAVENOUS
  Filled 2014-03-06: qty 250

## 2014-03-06 MED ORDER — FAMOTIDINE 20 MG PO TABS
20.0000 mg | ORAL_TABLET | Freq: Every day | ORAL | Status: DC
  Administered 2014-03-07: 20 mg via ORAL
  Filled 2014-03-06: qty 1

## 2014-03-06 MED ORDER — LEVOTHYROXINE SODIUM 25 MCG PO TABS
75.0000 ug | ORAL_TABLET | Freq: Every day | ORAL | Status: DC
  Administered 2014-03-07: 75 ug via ORAL
  Filled 2014-03-06 (×2): qty 1

## 2014-03-06 NOTE — Progress Notes (Addendum)
ANTICOAGULATION CONSULT NOTE - Follow Up Consult  Pharmacy Consult for warfarin Indication: atrial fibrillation S/P R MCA Infarct  Allergies  Allergen Reactions  . Donepezil Hydrochloride     REACTION: heart fluttering  . Morphine Sulfate   . Oxycodone-Acetaminophen     REACTION: Hallucinations    Patient Measurements: Height: 5' (152.4 cm) Weight: 90 lb 2.7 oz (40.9 kg) IBW/kg (Calculated) : 45.5  Vital Signs: Temp: 98.2 F (36.8 C) (07/30 0942) Temp src: Oral (07/30 0942) BP: 176/68 mmHg (07/30 0942) Pulse Rate: 85 (07/30 0942)  Labs:  Recent Labs  03/03/14 1420 03/05/14 0343 03/06/14 0719  HGB 12.2 11.9*  --   HCT 37.6 36.3  --   PLT 154 162  --   APTT 37  --   --   LABPROT 15.4*  --  18.5*  INR 1.22  --  1.54*  CREATININE 1.10 0.85  --   TROPONINI <0.30  --   --     Estimated Creatinine Clearance: 33.5 ml/min (by C-G formula based on Cr of 0.85).  Assessment: 80 yo female with extension of right MCA infarct, with afib and severe aortic stenosis who was transitioned from [place] (X 3 doses 7/28-7/29)  to coumadin on 7/29. She received warfarin 5 mg last night and INR has trended up from BL 1.22 to 1.54, likely due to residual apixaban. Patient to begin IV heparin to bridge warfarin for 5 days and until INR > 2.  Goal of Therapy:  INR 2-3 Heparin level 0.3-0.5 units/ml   Plan:  1) Coumadin 5mg  po X 1 2) Heparin 550 units/hr IV continuous 3) 1900 heparin level and aPTT (Goal 60-85) 4) daily PT/INR, CBC, heparin level, and aPTT   Theron Arista, PharmD Clinical Pharmacist - Resident Pager: [phone] 7/30/201511:17 AM  Heparin level 0.82 above goal, mostly like level still affected by apixaban. aPTT 55 close to low end goal. Per RN, no s/sx of bleeding, no issue with infusion  Plan:  Increase heparin rate slightly to 600 unit/hr F/u AM heparin level, aPTT, PT/INR and CBC

## 2014-03-06 NOTE — Progress Notes (Signed)
[facility]                                                                              Patient Demographics  Rita Miller, is a 80 y.o. female, DOB - 09-05-1929, GMW:[id number]  Admit date - 03/03/2014   Admitting Physician Domenic Polite, MD  Outpatient Primary MD for the patient is Penni Homans, MD  LOS - 3   Chief Complaint  Patient presents with  . Weakness        Assessment & Plan   Extension of Stroke / Underlying Dementia  -recent admit for CVA June 2015  -SLP rec D1 diet with specifications (see their note)  -appreciate Neuro assistance: cont ASA-7/28 Neuro recommended to CONTINUE Apixaban but to increase to BID dosing. PLEASE NOTE according to the patient's MAR from the nursing facility her Apixaban was discontinued on 6/26 and per package insert should not be used in patients with valvular heart disease and AF (per previous hospitalist) -Anticoagulation will have to done with coumadin as all others have not been approved for use in the patients with valvular heart disease. -ECHO without obvious embolic source for CVA  -Appreciate Palliative assistance- daughter amenable to continued DNR status and confirmed no feeding tube but not ready for Hospice or comfort measures- note Palliative estimates pt has < 6 months based on current clinical picture  -Will start patient on heparin drip to bridge to Coumadin.  Severe aortic stenosis  -ECHO this admit demonstrates VERY severe AoS--Vmax 0.41 cm^2  -Oupateint Cardiology notes from Dr. Stanford Breed reviewed, patient is not a surgical candidate -Patient and family made aware this and understand that this could lead to death -Cardiology documented Apixaban with underlying valvular heart disease not best choice for anticoagulation agent   Pulmonary hypertension  -If patient's BP increases would start Imdur to help with pulmonary hypertension   Atrial fibrillation  -rate controlled  -h/o TBS post pacer  -pharmacy to dose  Coumadin  -INR 1.54  Abdominal pain  -bladder scan with <150 cc urine  -suspected constipation so checked KUB which showed significant stool. -Patient was given Dulcolax suppository and milk of magnesia along with Colace. -Patient did have a bowel movement on 03/05/2014 -Will continue to monitor and avoid narcotics.  History of pulmonary embolism  -unable to locate actual date of PE but notes dating back to 2013 with "h/o PE" so suspect is now remote enough that anti coagulation related to this not indicated   Protein-calorie malnutrition, severe/FTT  -ongoing and progressively worse  -diet as per SLP recs-daughter says no PEG/feeding tube  -Continue dysphagia 1 diet and supplementation  HYPOTHYROIDISM  -continue Synthroid  -TSH in April was normal   ANEMIA-IRON DEFICIENCY  -Hemoglobin remained stable, 11.9  HTN /moderate LVH  -BP controlled  -on CCB preadmit   COPD  -compensated and no wheezing   Peripheral vascular disease  -feet cool at baseline   History of UTI  -recent start outpatient of Keflex for UTI (no cx obtained)  -history enterococcus and E Coli in May  -begin Ancef  -Urine culture and process  Code Status: DNR  Family Communication: Left message with daughter, [name]  Disposition Plan: Admitted  Time  Spent in minutes   30 minutes  Procedures  7/27 CT head without contrast  - Acute/ subacute extension right middle cerebral artery distribution stroke in right frontal lobe and basal ganglia  - Minimal mass effect with effacement of cortical sulci in right frontal lobe and slight midline shift left  approximating 3 mm.  -stable encephalomalacia involving right temporal,parietal and posterior frontal lobes related to the remote large MCA distribution stroke.  -Stable moderate to severe generalized atrophy and severe chronic microvascular ischemic changes of the white matter.   2-D echocardiogram  - Left ventricle: moderate LVH. LVEF= 55%.  - Aortic  valve: very severe aortic stenosis.  - Left atrium: severely dilated. - Right atrium: mildly dilated. - Pulmonary arteries: PA peak pressure: 42 mm Hg (S). - Impressions: No cardiac source of embolism was identified,   Carotid Dopplers  -normal   Consults   Neurology Palliative Care  DVT Prophylaxis  SCDs/Coumadin/heparin  Lab Results  Component Value Date   PLT 162 03/05/2014    Medications  Scheduled Meds: . antiseptic oral rinse  15 mL Mouth Rinse q12n4p  .  ceFAZolin (ANCEF) IV  1 g Intravenous Q12H  . chlorhexidine  15 mL Mouth Rinse BID  . docusate  200 mg Oral BID  . [START ON 03/07/2014] famotidine  20 mg Oral Daily  . feeding supplement (ENSURE COMPLETE)  237 mL Oral BID BM  . fluticasone  2 puff Inhalation Daily  . [START ON 03/07/2014] levothyroxine  75 mcg Oral QAC breakfast  . memantine  10 mg Oral BID  . Rivastigmine  13.3 mg Transdermal Q24H  . tiotropium  18 mcg Inhalation Daily  . warfarin  5 mg Oral ONCE-1800  . Warfarin - Pharmacist Dosing Inpatient   Does not apply q1800   Continuous Infusions: . heparin 550 Units/hr (03/06/14 1351)   PRN Meds:.albuterol, metoprolol, traMADol  Antibiotics    Anti-infectives   Start     Dose/Rate Route Frequency Ordered Stop   03/04/14 1100  ceFAZolin (ANCEF) IVPB 1 g/50 mL premix     1 g 100 mL/hr over 30 Minutes Intravenous Every 12 hours 03/04/14 0947     03/03/14 2315  cephALEXin (KEFLEX) capsule 500 mg  Status:  Discontinued     500 mg Oral Every 12 hours 03/03/14 2302 03/04/14 0944        Subjective:   [name] seen and examined today.  Patient continues to complain of abdominal pain but does state she had a bowel movement yesterday. She denies any dizziness, headache, diarrhea, shortness of breath or chest pain at this time.  Objective:   Filed Vitals:   03/06/14 0223 03/06/14 0559 03/06/14 0942 03/06/14 1349  BP: 154/65 161/69 176/68 142/64  Pulse: 86 71 85 100  Temp: 98.6 F (37 C) 98.2  F (36.8 C) 98.2 F (36.8 C) 97.8 F (36.6 C)  TempSrc: Oral Oral Oral Oral  Resp: 16 16 17    Height:      Weight:      SpO2: 96% 94% 96% 96%    Wt Readings from Last 3 Encounters:  03/05/14 40.9 kg (90 lb 2.7 oz)  02/28/14 41.277 kg (91 lb)  02/26/14 42.221 kg (93 lb 1.3 oz)    No intake or output data in the 24 hours ending 03/06/14 1429  Exam  General: Well developed, thin, NAD, appears stated age  HEENT: NCAT, PERRLA, EOMI, Anicteic Sclera, mucous membranes moist.   Cardiovascular: S1 S2 auscultated, 3/6 SEM, Regular  rate and rhythm.  Respiratory: Clear to auscultation bilaterally with equal chest rise  Abdomen: Soft, nontender, nondistended, + bowel sounds  Extremities: warm dry without cyanosis clubbing or edema  Neuro: AAOx3, cranial nerves grossly intact.   Skin: Without rashes exudates or nodules  Psych: Normal affect and demeanor with intact judgement and insight  Data Review   Micro Results Recent Results (from the past 240 hour(s))  MRSA PCR SCREENING     Status: None   Collection Time    03/03/14  7:16 PM      Result Value Ref Range Status   MRSA by PCR NEGATIVE  NEGATIVE Final   Comment:            The GeneXpert MRSA Assay (FDA     approved for NASAL specimens     only), is one component of a     comprehensive MRSA colonization     surveillance program. It is not     intended to diagnose MRSA     infection nor to guide or     monitor treatment for     MRSA infections.    Radiology Reports Dg Chest 2 View  03/03/2014   CLINICAL DATA:  Stroke. History pulmonary embolism, hypertension, asthma, PAD  EXAM: CHEST  2 VIEW  COMPARISON:  Chest radiograph 01/07/2014, 01/07/2014, 5 04/27/2014, and chest CT 03/04/2011  FINDINGS: Patient rotated to the left. Stable cardiomegaly. Right chest wall dual lead pacer is stable. Heavy atherosclerotic calcification of the thoracic aorta noted. Chronic focal peripheral opacity in the right upper lung field,  likely reflects a focal area of pulmonary scarring. There is mild left basilar atelectasis. Emphysematous changes are noted.  Right mastectomy right axillary nodal dissection.  Diffuse osteopenia.  IMPRESSION: COPD and chronic scarring in the peripheral right upper lung. No definite acute cardiopulmonary disease.  Cardiomegaly and thoracic aorta atherosclerosis.  Osteopenia.   Electronically Signed   By: Curlene Dolphin M.D.   On: 03/03/2014 22:02   Ct Head Wo Contrast  03/03/2014   CLINICAL DATA:  Acute mental status changes. Prior history of stroke.  EXAM: CT HEAD WITHOUT CONTRAST  TECHNIQUE: Contiguous axial images were obtained from the base of the skull through the vertex without intravenous contrast.  COMPARISON:  01/07/2014, 01/06/2014, 11/07/1009.  FINDINGS: Remote large right middle cerebral artery distribution stroke with encephalomalacia in the right temporal lobe, right frontal lobe and right parietal lobe. There is evidence of extension of the stroke to involve more of the right frontal cortex and the right basal ganglia since the most recent examination 01/07/2014. No associated hemorrhage or hematoma. Mild mass effect, with effacement of cortical sulci in the right frontal lobe and minimal shift of the midline to the left approximating 3 mm. No extra-axial fluid collections. Moderate to severe cortical and deep atrophy and moderate to severe cerebellar atrophy, unchanged. Severe changes of small vessel disease of the white matter diffusely, unchanged.  No skull fracture or other focal osseous abnormality involving the skull. Visualized paranasal sinuses, bilateral mastoid air cells and bilateral middle ear cavities well-aerated. Extensive bilateral carotid siphon and vertebral artery atherosclerosis.  IMPRESSION: 1. Acute or subacute extension of the right middle cerebral artery distribution stroke in the right frontal lobe and basal ganglia, new since the 01/07/2014 examination, without associated  hemorrhage or hematoma. 2. Minimal mass effect with effacement of cortical sulci in the right frontal lobe and slight midline shift to the left approximating 3 mm. 3. Otherwise, stable encephalomalacia involving the  right temporal, parietal and posterior frontal lobes related to the remote large MCA distribution stroke. 4. Stable moderate to severe generalized atrophy and severe chronic microvascular ischemic changes of the white matter.   Electronically Signed   By: Evangeline Dakin M.D.   On: 03/03/2014 15:09   Dg Abd Portable 1v  03/05/2014   CLINICAL DATA:  Diarrhea and pain.  EXAM: PORTABLE ABDOMEN - 1 VIEW  COMPARISON:  CT 01/20/2014.  FINDINGS: Soft tissue structures are unremarkable. Gas pattern is nonspecific. Stool noted throughout the colon. No free air. Aortoiliac atherosclerotic vascular disease. Left iliac stent. Degenerative changes lumbar spine. Postsurgical changes both hips. Calcified injection granulomas.  IMPRESSION: Nonspecific exam. No evidence of significant bowel distention. No free air. Stool is present in the colon.   Electronically Signed   By: Marcello Moores  Register   On: 03/05/2014 14:43    CBC  Recent Labs Lab 03/03/14 1420 03/05/14 0343  WBC 9.2 7.0  HGB 12.2 11.9*  HCT 37.6 36.3  PLT 154 162  MCV 98.4 96.0  MCH 31.9 31.5  MCHC 32.4 32.8  RDW 14.6 14.6  LYMPHSABS 0.6*  --   MONOABS 0.3  --   EOSABS 0.4  --   BASOSABS 0.0  --     Chemistries   Recent Labs Lab 03/03/14 1420 03/05/14 0343  NA 137 141  K 3.9 3.8  CL 101 104  CO2 23 22  GLUCOSE 176* 90  BUN 27* 28*  CREATININE 1.10 0.85  CALCIUM 9.0 8.5  AST 24  --   ALT 7  --   ALKPHOS 74  --   BILITOT 0.8  --    ------------------------------------------------------------------------------------------------------------------ estimated creatinine clearance is 33.5 ml/min (by C-G formula based on Cr of  0.85). ------------------------------------------------------------------------------------------------------------------  Recent Labs  03/04/14 0316  HGBA1C 5.5   ------------------------------------------------------------------------------------------------------------------  Recent Labs  03/04/14 0316  CHOL 148  HDL 58  LDLCALC 74  TRIG 79  CHOLHDL 2.6   ------------------------------------------------------------------------------------------------------------------ No results found for this basename: TSH, T4TOTAL, FREET3, T3FREE, THYROIDAB,  in the last 72 hours ------------------------------------------------------------------------------------------------------------------ No results found for this basename: VITAMINB12, FOLATE, FERRITIN, TIBC, IRON, RETICCTPCT,  in the last 72 hours  Coagulation profile  Recent Labs Lab 03/03/14 1420 03/06/14 0719  INR 1.22 1.54*    No results found for this basename: DDIMER,  in the last 72 hours  Cardiac Enzymes  Recent Labs Lab 03/03/14 1420  TROPONINI <0.30   ------------------------------------------------------------------------------------------------------------------ No components found with this basename: POCBNP,     Zienna Ahlin D.O. on 03/06/2014 at 2:29 PM  Between 7am to 7pm - Pager - [phone]  After 7pm go to [email or web address] - password [email or web address]  And look for the night coverage person covering for me after hours  [facility] Office  [phone]

## 2014-03-06 NOTE — Clinical Social Work Note (Signed)
Patient transferred from 2C02 to 4N24. Handoff provided to receiving CSW.   Davarion Cuffee Givens, MSW, LCSW [phone]

## 2014-03-06 NOTE — Consult Note (Signed)
I have reviewed and discussed case with Nurse Practitioner And agree with documentation and plan as noted above   Dominga Mcduffie J. Hance Caspers D.O.  Palliative Medicine Team at [facility]  Team Phone: [phone]    

## 2014-03-06 NOTE — Progress Notes (Signed)
Speech Language Pathology Treatment: Dysphagia  Patient Details Name: Rita Miller MRN: [id number] DOB: 03-09-1930 Today's Date: 03/06/2014 Time: 6286-3817 SLP Time Calculation (min): 33 min  Assessment / Plan / Recommendation Clinical Impression  Patient reports difficulty swallowing, with frequent cough.  SLP observed patient with thin liquids and noted delayed swallow initiation and intermittent coughing after swallowing.  Nectar liquids appeared to be tolerated without coughing.  Double swallows noted with purees, indicative of laryngeal residue.  Will change to nectar thick liquids.  If patient is unhappy with thickened liquids, may change back to thin, with known risks, as pallative care is following.   HPI HPI: 80 y.o. female with PMH of Right MCA CVA in 6/15, Afib, dementia, Aortic stenosis, CAD, COPD, tachy-brady syndrome s/p Pacemaker, GERD, h/o breast CA admitted for left sided weakness.  CXR COPD and chronic scarring in the peripheral right upper lung. No definite acute cardiopulmonary disease.  CT revealed Acute or subacute extension of the right middle cerebral artery distribution stroke in the right frontal lobe and basal ganglia, new since the 01/07/2014 examination, without associated hemorrhage or hematoma.  Per MD notes, pt. seen by Palliative care last month and recommendations made for Palliative care FU at SNF.   Pertinent Vitals afebrile  SLP Plan  Continue with current plan of care    Recommendations Diet recommendations: Nectar-thick liquid;Dysphagia 1 (puree) Liquids provided via: Cup;No straw Medication Administration: Whole meds with puree Supervision: Full supervision/cueing for compensatory strategies;Staff to assist with self feeding Compensations: Slow rate;Small sips/bites Postural Changes and/or Swallow Maneuvers: Seated upright 90 degrees              Oral Care Recommendations: Oral care BID Follow up Recommendations: Skilled Nursing facility Plan:  Continue with current plan of care    GO     Quinn Axe T 03/06/2014, 10:54 AM

## 2014-03-06 NOTE — Progress Notes (Signed)
IM ( Important Message) given to patient explaining their rights as a patientAneta Miller [phone]

## 2014-03-06 NOTE — Progress Notes (Addendum)
CSW has left a message for pt daughter to discuss discharge plans. CSW has also visited pt room however no family present.  Full assessment to follow. Hunt Oris, MSW, [place]

## 2014-03-06 NOTE — Progress Notes (Addendum)
Ms. Bernath daughter, Rita Miller, has asked to be contacted/updated by any providers that see her. Cell is [phone] and should be able to reach her there. Thank you.  [name], NP Palliative Medicine Team Pager # [phone] (M-F 8a-5p) Team Phone # [phone] (Nights/Weekends)

## 2014-03-06 NOTE — Progress Notes (Signed)
Progress Note from the Palliative Medicine Team at [facility]: Rita Miller is sitting up in chair today and is complaining of soreness and that the bed is uncomfortable. I will order air mattress for her comfort as well as for skin protection as she is cachectic and high risk for skin breakdown. She is awake but appears exhausted but she did just work with physical therapy.   I called her daughter, Rita Miller, but Rita Miller says that she is not well and having back pain at home. She is very upset about her mother receiving tramadol 25 mg po and says "25 is too much and it's going to knock her out." I explained that tramadol is not as sedating as other narcotics Rita Miller has received and that 25 mg is a very small and appropriate dose. Rita Miller insists that we "cut it in half" even after explaining that Rita Miller seemed awake and not lethargic when I saw her after she had received a dose of tramadol. I attempted to order tramadol 12.5 mg in elixir but we do not have elixir in stock in pharmacy and 25 mg is lowest dose available in pill form. I did tell Rita Miller I would have to check with pharmacy as a smaller dose may not be possible. This is a very difficult situation as Rita Miller is struggling with her mother's decline and fixated on specifics of medications and dosages. She was previously upset that her mother had been given acetaminophen 1000 mg as "1000 mg is too high for a small 90 pound women" as she was describing to Dr. Sherral Hammers when I came by yesterday. I will continue to follow and try and support Rita Miller and Rita Miller her decline.       Objective: Allergies  Allergen Reactions  . Donepezil Hydrochloride     REACTION: heart fluttering  . Morphine Sulfate   . Oxycodone-Acetaminophen     REACTION: Hallucinations   Scheduled Meds: . antiseptic oral rinse  15 mL Mouth Rinse q12n4p  .  ceFAZolin (ANCEF) IV  1 g Intravenous Q12H  . chlorhexidine  15 mL Mouth Rinse BID  . docusate  200 mg Oral BID  .  famotidine (PEPCID) IV  20 mg Intravenous Daily  . feeding supplement (ENSURE COMPLETE)  237 mL Oral BID BM  . fluticasone  2 puff Inhalation Daily  . levothyroxine  37.5 mcg Intravenous Daily  . memantine  10 mg Oral BID  . Rivastigmine  13.3 mg Transdermal Q24H  . tiotropium  18 mcg Inhalation Daily  . Warfarin - Pharmacist Dosing Inpatient   Does not apply q1800   Continuous Infusions:  PRN Meds:.albuterol, metoprolol, traMADol  BP 176/68  Pulse 85  Temp(Src) 98.2 F (36.8 C) (Oral)  Resp 17  Ht 5' (1.524 m)  Wt 40.9 kg (90 lb 2.7 oz)  BMI 17.61 kg/m2  SpO2 96%   PPS: 30% at best     Intake/Output Summary (Last 24 hours) at 03/06/14 1011 Last data filed at 03/05/14 1334  Gross per 24 hour  Intake     50 ml  Output    350 ml  Net   -300 ml      LBM: 03/05/14      Physical Exam:  General: NAD, pleasant, cachectic  HEENT: Severe temporal muscle wasting, no JVD, oral mucosa moist without exudate  Chest: CTA throughout, no labored breathing, symmetric  CVS: RRR, S1 S2  Abdomen: Soft, NT, ND, +BS  Ext: Bilateral feet cool to  touch, weakened left side  Neuro: Arousable, speaks softly, oriented to person only   Labs: CBC    Component Value Date/Time   WBC 7.0 03/05/2014 0343   WBC 6.6 08/30/2013 1049   RBC 3.78* 03/05/2014 0343   RBC 3.64* 08/30/2013 1049   RBC 3.25* 09/21/2011 1402   HGB 11.9* 03/05/2014 0343   HGB 11.2* 08/30/2013 1049   HCT 36.3 03/05/2014 0343   HCT 35.9 08/30/2013 1049   PLT 162 03/05/2014 0343   PLT 233 08/30/2013 1049   MCV 96.0 03/05/2014 0343   MCV 99 08/30/2013 1049   MCH 31.5 03/05/2014 0343   MCH 30.8 08/30/2013 1049   MCHC 32.8 03/05/2014 0343   MCHC 31.2* 08/30/2013 1049   RDW 14.6 03/05/2014 0343   RDW 14.9 08/30/2013 1049   LYMPHSABS 0.6* 03/03/2014 1420   LYMPHSABS 1.0 08/30/2013 1049   MONOABS 0.3 03/03/2014 1420   EOSABS 0.4 03/03/2014 1420   EOSABS 0.1 08/30/2013 1049   BASOSABS 0.0 03/03/2014 1420   BASOSABS 0.0 08/30/2013 1049     BMET    Component Value Date/Time   NA 141 03/05/2014 0343   K 3.8 03/05/2014 0343   CL 104 03/05/2014 0343   CO2 22 03/05/2014 0343   GLUCOSE 90 03/05/2014 0343   BUN 28* 03/05/2014 0343   CREATININE 0.85 03/05/2014 0343   CREATININE 1.00 01/17/2014 1352   CALCIUM 8.5 03/05/2014 0343   GFRNONAA 63* 03/05/2014 0343   GFRNONAA 49* 11/04/2010 1120   GFRAA 72* 03/05/2014 0343   GFRAA 59* 11/04/2010 1120    CMP     Component Value Date/Time   NA 141 03/05/2014 0343   K 3.8 03/05/2014 0343   CL 104 03/05/2014 0343   CO2 22 03/05/2014 0343   GLUCOSE 90 03/05/2014 0343   BUN 28* 03/05/2014 0343   CREATININE 0.85 03/05/2014 0343   CREATININE 1.00 01/17/2014 1352   CALCIUM 8.5 03/05/2014 0343   PROT 7.6 03/03/2014 1420   ALBUMIN 2.9* 03/03/2014 1420   AST 24 03/03/2014 1420   ALT 7 03/03/2014 1420   ALKPHOS 74 03/03/2014 1420   BILITOT 0.8 03/03/2014 1420   GFRNONAA 63* 03/05/2014 0343   GFRNONAA 49* 11/04/2010 1120   GFRAA 72* 03/05/2014 0343   GFRAA 59* 11/04/2010 1120    Assessment and Plan: 1. Code Status: DNR 2. Symptom Control: 1. Pain: Continue Tramadol 25 mg po every 6 hours prn as 12.5 mg is not an option. I do not see any negative effect from this dosage. Will continue to discuss this with daughter Rita Miller as needed.  2. Weakness: Continue medical management. PT eval. 3. Dysphagia: SLP following. Dysphagia I pureed food with recommendations today for nectar thick liquids instead of thin. 4. Severe protein calorie malnutrition: NO FEEDING TUBE now or in future. Encourage liked foods. Ensure BID.  3. Psycho/Social: Emotional support provided to patient and family via telephone.  4. Disposition: SNF. Palliative if able to get at facility.   Patient Documents Completed or Given: Document Given Completed  Advanced Directives Pkt    MOST    DNR    Gone from My Sight    Hard Choices Previously given     Time In Time Out Total Time Spent with Patient Total Overall Time  0950 1015 57min  61min    Greater than 50%  of this time was spent counseling and coordinating care related to the above assessment and plan.  [name], NP Palliative Medicine Team Pager #  [phone] (M-F 8a-5p) Team Phone # [phone] (Nights/Weekends)   1

## 2014-03-06 NOTE — Progress Notes (Signed)
Physical Therapy Treatment Patient Details Name: Akire Rennert MRN: [id number] DOB: 1930/01/03 Today's Date: 03/06/2014    History of Present Illness 80 y.o. female with PMH of Right MCA CVA in 6/15, Afib, dementia, Aortic stenosis, CAD, COPD, tachy-brady syndrome s/p Pacemaker, GERD, h/o breast CA admitted for left sided weakness.  CXR COPD and chronic scarring in the peripheral right upper lung. No definite acute cardiopulmonary disease.  CT revealed Acute or subacute extension of the right middle cerebral artery distribution stroke in the right frontal lobe and basal ganglia, new since the 01/07/2014 examination, without associated hemorrhage or hematoma.  Per MD notes, pt. seen by Palliative care last month and recommendations made for Palliative care FU at SNF.     PT Comments    Pt transferred +2 total assist from bed to chair due to pt's inattention to left side and the patient being unaware of deficits. Plan to work with pt next session on sitting balance (as she has a posterior lean), standing, and step pivot to chair using R LE and reaching with R UE. Pt would benefit from SNF in order to increase strength and independence. PT to follow acutely for the below listed deficits.  Follow Up Recommendations  SNF;Supervision/Assistance - 24 hour     Equipment Recommendations  3in1 (PT);Wheelchair (measurements PT);Hospital bed      Precautions / Restrictions Precautions Precautions: Fall Precaution Comments: L sided hemiplegia; Lt neglect Restrictions Weight Bearing Restrictions: No    Mobility  Bed Mobility Overal bed mobility: Needs Assistance;+2 for physical assistance Bed Mobility: Supine to Sit;Rolling Rolling: Mod assist   Supine to sit: Mod assist;+2 for physical assistance     General bed mobility comments: Bed pad used to assist in scooting. Assist for LE movement and trunk elevation. Pt able to grab bed rail with RUE with cueing. Once EOB needs assistance scooting hips  forward.  Transfers Overall transfer level: Needs assistance Equipment used: 2 person hand held assist Transfers: Sit to/from [employer] Sit to Stand: Total assist;+2 physical assistance   Squat pivot transfers: Total assist;+2 physical assistance     General transfer comment: Cued to hold onto tech with RUE as pushes. Total assist to transfer from bed to chair using gait belt and hand under ischium to transfer.     Modified Rankin (Stroke Patients Only) Modified Rankin (Stroke Patients Only) Pre-Morbid Rankin Score: Moderately severe disability Modified Rankin: Severe disability     Balance Overall balance assessment: Needs assistance Sitting-balance support: Feet supported;Bilateral upper extremity supported Sitting balance-Leahy Scale: Poor Sitting balance - Comments: Pt unable to maintain sitting balance consistently with posterior lean. Postural control: Posterior lean Standing balance support: During functional activity;Bilateral upper extremity supported Standing balance-Leahy Scale: Zero                      Cognition Arousal/Alertness: Lethargic Behavior During Therapy: Flat affect Overall Cognitive Status: No family/caregiver present to determine baseline cognitive functioning Area of Impairment: Attention;Following commands;Problem solving;Awareness   Current Attention Level: Sustained   Following Commands: Follows one step commands inconsistently     Problem Solving: Slow processing;Decreased initiation;Difficulty sequencing;Requires verbal cues;Requires tactile cues General Comments: No family present to provide info re: pt's baseline (Pt demonstrated inattention to L side, unaware of deficits)           Pertinent Vitals/Pain See vitals flow sheet.            PT Goals (current goals can now be found in the care  plan section) Acute Rehab PT Goals PT Goal Formulation: Patient unable to participate in goal setting Time For  Goal Achievement: 03/18/14 Potential to Achieve Goals: Fair    Frequency  Min 2X/week    PT Plan Current plan remains appropriate       End of Session Equipment Utilized During Treatment: Gait belt Activity Tolerance: Patient limited by fatigue Patient left: in chair;with chair alarm set;with call bell/phone within reach     Time: 0936-0959 PT Time Calculation (min): 23 min  Charges:   2 TA                    G CodesEber Jones, [place] [phone]

## 2014-03-07 ENCOUNTER — Other Ambulatory Visit: Payer: Self-pay

## 2014-03-07 ENCOUNTER — Telehealth: Payer: Self-pay | Admitting: Physician Assistant

## 2014-03-07 ENCOUNTER — Inpatient Hospital Stay (HOSPITAL_COMMUNITY): Payer: Medicare Other

## 2014-03-07 DIAGNOSIS — I739 Peripheral vascular disease, unspecified: Secondary | ICD-10-CM

## 2014-03-07 DIAGNOSIS — E785 Hyperlipidemia, unspecified: Secondary | ICD-10-CM

## 2014-03-07 LAB — PROTIME-INR
INR: 2.96 — ABNORMAL HIGH (ref 0.00–1.49)
Prothrombin Time: 30.8 seconds — ABNORMAL HIGH (ref 11.6–15.2)

## 2014-03-07 LAB — CBC
HCT: 39.8 % (ref 36.0–46.0)
Hemoglobin: 12.9 g/dL (ref 12.0–15.0)
MCH: 32 pg (ref 26.0–34.0)
MCHC: 32.4 g/dL (ref 30.0–36.0)
MCV: 98.8 fL (ref 78.0–100.0)
Platelets: 198 10*3/uL (ref 150–400)
RBC: 4.03 MIL/uL (ref 3.87–5.11)
RDW: 14.4 % (ref 11.5–15.5)
WBC: 11.5 10*3/uL — ABNORMAL HIGH (ref 4.0–10.5)

## 2014-03-07 LAB — HEPARIN LEVEL (UNFRACTIONATED)
Heparin Unfractionated: 0.49 IU/mL (ref 0.30–0.70)
Heparin Unfractionated: 0.33 IU/mL (ref 0.30–0.70)

## 2014-03-07 LAB — APTT
APTT: 60 s — AB (ref 24–37)
aPTT: 48 seconds — ABNORMAL HIGH (ref 24–37)

## 2014-03-07 MED ORDER — CHLORHEXIDINE GLUCONATE 0.12 % MT SOLN: 15.0000 mL | Freq: Two times a day (BID) | OROMUCOSAL | Status: AC

## 2014-03-07 MED ORDER — BIOTENE DRY MOUTH MT LIQD: 15.0000 mL | Freq: Two times a day (BID) | OROMUCOSAL | Status: DC

## 2014-03-07 MED ORDER — ENSURE COMPLETE PO LIQD: 237.0000 mL | Freq: Two times a day (BID) | ORAL | Status: DC

## 2014-03-07 MED ORDER — WARFARIN SODIUM 2.5 MG PO TABS: 2.5000 mg | ORAL_TABLET | Freq: Every day | ORAL | Status: DC

## 2014-03-07 MED ORDER — DOCUSATE SODIUM 50 MG/5ML PO LIQD: 200.0000 mg | Freq: Two times a day (BID) | ORAL | Status: DC

## 2014-03-07 NOTE — Progress Notes (Signed)
D/C orders received. Pt notified of d/c. Report called to [facility]. IV removed. Pt transported to SNF via [facility].

## 2014-03-07 NOTE — Discharge Summary (Signed)
Physician Discharge Summary  Rita Miller WUX:[id number] DOB: 01-20-1930 DOA: 03/03/2014  PCP: Penni Homans, MD  Admit date: 03/03/2014 Discharge date: 03/07/2014  Time spent: 45 minutes  Recommendations for Outpatient Follow-up:  Patient will be discharged to [facility] facility. Patient to continue taking her medications as prescribed. She should continue therapy as set forth by the nursing facility. Patient will be followed by palliative care at the nursing home. Patient should continue a dysphagia 1 diet, with thin liquids however no straw. Patient should have her INR checked on 03/10/2014.  Discharge Diagnoses:  Exception of stroke/underlying dementia Superior stenosis Pulmonary hypertension Atrial fibrillation Abdominal pain History of pulmonary embolism Protein calorie malnutrition/severe failure to thrive Hypothyroidism Anemia, iron deficiency Hypertension with moderate LVH COPD Peripheral vascular disease History of UTI  Discharge Condition: Stable  Diet recommendation: Dysphagia 1 diet.  Filed Weights   03/03/14 1920 03/05/14 0400  Weight: 40.914 kg (90 lb 3.2 oz) 40.9 kg (90 lb 2.7 oz)    History of present illness:  Rita Miller is a 80 y.o. female with PMH of Right MCA CVA in 6/15, Afib on apixaban, Dementia, Aortic stenosis, CAD, COPD, tachy-brady syndrome s/p Pacemaker, h/o breast CA, resident of SNF was sent to [place] ER by her PCP today to evaluate her Left sided weakness. Reportedly per her Aides at SNF, she had been unable to use her L side the day of admission. She was taken to see Dr.Blyth who then referred her to [place] [facility].  In [place] ER, workup, namely CT noted Acute or subacute extension of the right middle cerebral artery distribution stroke and upon discussion with [facility] and Neurology she was accepted to SDU at [facility]. Patient was unable to provide the admitting physician with any information. Of note, she was seen by Palliative care  last month and made DNR and recommendations made for Palliative care followup at SNF.  Hospital Course:  Extension of Stroke / Underlying Dementia  -recent admit for CVA June 2015  -SLP rec D1 diet with specifications (see their note)  -appreciate Neuro assistance: cont ASA-7/28 Neuro recommended to CONTINUE Apixaban but to increase to BID dosing. PLEASE NOTE according to the patient's MAR from the nursing facility her Apixaban was discontinued on 6/26 and per package insert should not be used in patients with valvular heart disease and AF (per previous hospitalist)  -Anticoagulation will have to done with coumadin as all others have not been approved for use in the patients with valvular heart disease.  -ECHO without obvious embolic source for CVA  -Appreciate Palliative assistance- daughter amenable to continued DNR status and confirmed no feeding tube but not ready for Hospice or comfort measures- note Palliative estimates pt has < 6 months based on current clinical picture  -Patient was started on heparin drip and productive Coumadin. -Patient currently therapeutic with Coumadin, INR 2.96 -Patient will be discharged with Coumadin, 2.5 mg nightly however should be started on 03/08/2014 -Patient will need to have her INR checked on Monday, 03/10/2014  Severe aortic stenosis  -ECHO this admit demonstrates VERY severe AoS--Vmax 0.41 cm^2  -Oupateint Cardiology notes from Dr. Stanford Breed reviewed, patient is not a surgical candidate  -Patient and family made aware this and understand that this could lead to death  -Cardiology documented Apixaban with underlying valvular heart disease not best choice for anticoagulation agent   Pulmonary hypertension  -If patient's BP increases would start Imdur to help with pulmonary hypertension   Atrial fibrillation  -rate controlled  -  h/o TBS post pacer  -INR 2.96  Abdominal pain  -bladder scan with <150 cc urine  -suspected constipation so checked KUB  which showed significant stool.  -Patient was given Dulcolax suppository and milk of magnesia along with Colace.  -Patient did have a bowel movement on 03/05/2014  -Will continue to monitor and avoid narcotics and Tylenol, at the request of her daughter, Rita Miller.  History of pulmonary embolism  -unable to locate actual date of PE but notes dating back to 2013 with "h/o PE" so suspect is now remote enough that anti coagulation related to this not indicated   Protein-calorie malnutrition, severe/FTT  -ongoing and progressively worse  -diet as per SLP recs-daughter says no PEG/feeding tube  -Continue dysphagia 1 diet and supplementation   HYPOTHYROIDISM  -continue Synthroid  -TSH in April was normal   ANEMIA-IRON DEFICIENCY  -Hemoglobin remained stable, 11.9   HTN /moderate LVH  -BP controlled  -on CCB preadmit   COPD  -compensated and no wheezing   Peripheral vascular disease  -feet cool at baseline   History of UTI  -recent start outpatient of Keflex for UTI (no cx obtained)  -history enterococcus and E Coli in May  -Urine culture negative for any growth.  Procedures  7/27 CT head without contrast  - Acute/ subacute extension right middle cerebral artery distribution stroke in right frontal lobe and basal ganglia  - Minimal mass effect with effacement of cortical sulci in right frontal lobe and slight midline shift left  approximating 3 mm.  -stable encephalomalacia involving right temporal,parietal and posterior frontal lobes related to the remote large MCA distribution stroke.  -Stable moderate to severe generalized atrophy and severe chronic microvascular ischemic changes of the white matter.  2-D echocardiogram  - Left ventricle: moderate LVH. LVEF= 55%.  - Aortic valve: very severe aortic stenosis.  - Left atrium: severely dilated. - Right atrium: mildly dilated. - Pulmonary arteries: PA peak pressure: 42 mm Hg (S). - Impressions: No cardiac source of embolism was  identified,  Carotid Dopplers  -normal   Consults  Neurology  Palliative Care   Discharge Exam: Filed Vitals:   03/07/14 1000  BP: 139/59  Pulse: 92  Temp: 98.5 F (36.9 C)  Resp: 18   Exam  General: Well developed, thin, NAD, appears stated age  HEENT: NCAT, mucous membranes moist.  Cardiovascular: S1 S2 auscultated, 3/6 SEM, Regular rate and rhythm.  Respiratory: Clear to auscultation bilaterally with equal chest rise  Abdomen: Soft, nontender, nondistended, + bowel sounds  Extremities: warm dry without cyanosis clubbing or edema  Neuro: AAOx3, no new focal deficits Psych: Normal affect and demeanor  Discharge Instructions      Discharge Instructions   Discharge instructions    Complete by:  As directed   Patient will be discharged to [place] facility. Patient to continue taking her medications as prescribed. She should continue therapy as set forth by the nursing facility. Patient will be followed by palliative care at the nursing home. Patient should continue a dysphagia 1 diet, with thin liquids however no straw. Patient should have her INR checked on 03/10/2014.            Medication List    STOP taking these medications       diltiazem 240 MG 24 hr capsule  Commonly known as:  CARDIZEM CD     nitrofurantoin (macrocrystal-monohydrate) 100 MG capsule  Commonly known as:  MACROBID      TAKE these medications  albuterol 108 (90 BASE) MCG/ACT inhaler  Commonly known as:  PROVENTIL HFA;VENTOLIN HFA  Inhale 2 puffs into the lungs every 6 (six) hours as needed for wheezing or shortness of breath.     antiseptic oral rinse Liqd  15 mLs by Mouth Rinse route 2 times daily at 12 noon and 4 pm.     chlorhexidine 0.12 % solution  Commonly known as:  PERIDEX  15 mLs by Mouth Rinse route 2 (two) times daily.     docusate 50 MG/5ML liquid  Commonly known as:  COLACE  Take 20 mLs (200 mg total) by mouth 2 (two) times daily.     EXELON 13.3  MG/24HR Pt24  Generic drug:  Rivastigmine  Place 1 patch onto the skin daily.     feeding supplement (ENSURE COMPLETE) Liqd  Take 237 mLs by mouth 2 (two) times daily between meals.     fluticasone 44 MCG/ACT inhaler  Commonly known as:  FLOVENT HFA  Inhale 2 puffs into the lungs daily.     guaifenesin 100 MG/5ML syrup  Commonly known as:  ROBITUSSIN  Take 100 mg by mouth every 4 (four) hours as needed for cough.     levothyroxine 75 MCG tablet  Commonly known as:  SYNTHROID, LEVOTHROID  Take 75 mcg by mouth daily before breakfast.     Melatonin 5 MG Tabs  Take 5 mg by mouth at bedtime.     memantine 10 MG tablet  Commonly known as:  NAMENDA  Take 10 mg by mouth 2 (two) times daily.     polyethylene glycol packet  Commonly known as:  MIRALAX / GLYCOLAX  Take 17 g by mouth every other day.     pravastatin 20 MG tablet  Commonly known as:  PRAVACHOL  Take 20 mg by mouth daily.     ranitidine 300 MG tablet  Commonly known as:  ZANTAC  Take 300 mg by mouth at bedtime.     REPLENS Gel  Place 1 application vaginally daily as needed (vaginal dryness).     sodium chloride 0.65 % Soln nasal spray  Commonly known as:  OCEAN  Place 1 spray into both nostrils 2 (two) times daily.     tiotropium 18 MCG inhalation capsule  Commonly known as:  SPIRIVA  Place 18 mcg into inhaler and inhale daily.     vitamin E 400 UNIT capsule  Take 400 Units by mouth daily.     warfarin 2.5 MG tablet  Commonly known as:  COUMADIN  Take 1 tablet (2.5 mg total) by mouth daily.  Start taking on:  03/08/2014       Allergies  Allergen Reactions  . Donepezil Hydrochloride     REACTION: heart fluttering  . Morphine Sulfate   . Oxycodone-Acetaminophen     REACTION: Hallucinations   Follow-up Information   Follow up with Penni Homans, MD. Schedule an appointment as soon as possible for a visit in 1 week. [facility] followup)    Specialty:  Family Medicine   Contact information:   [place] [address] [phone]       Schedule an appointment as soon as possible for a visit with Xu,Jindong, MD. (As needed, stroke clinic)    Specialty:  Neurology   Contact information:   [address] [place] [address] [phone]        The results of significant diagnostics from this hospitalization (including imaging, microbiology, ancillary and laboratory) are listed  below for reference.    Significant Diagnostic Studies: Dg Chest 2 View  03/07/2014   CLINICAL DATA:  Leukocytosis.  EXAM: CHEST  2 VIEW  COMPARISON:  March 03, 2014.  FINDINGS: Stable cardiomegaly. Right-sided pacemaker is unchanged in position. No pneumothorax or significant pleural effusion is noted. Surgical clips are noted in right axillary region. Density is again noted laterally in right upper lobe most consistent with scarring.  IMPRESSION: Stable chronic findings as described above. No significant change compared to prior exam. No acute cardiopulmonary abnormality seen.   Electronically Signed   By: Sabino Dick M.D.   On: 03/07/2014 08:04   Dg Chest 2 View  03/03/2014   CLINICAL DATA:  Stroke. History pulmonary embolism, hypertension, asthma, PAD  EXAM: CHEST  2 VIEW  COMPARISON:  Chest radiograph 01/07/2014, 01/07/2014, 5 04/27/2014, and chest CT 03/04/2011  FINDINGS: Patient rotated to the left. Stable cardiomegaly. Right chest wall dual lead pacer is stable. Heavy atherosclerotic calcification of the thoracic aorta noted. Chronic focal peripheral opacity in the right upper lung field, likely reflects a focal area of pulmonary scarring. There is mild left basilar atelectasis. Emphysematous changes are noted.  Right mastectomy right axillary nodal dissection.  Diffuse osteopenia.  IMPRESSION: COPD and chronic scarring in the peripheral right upper lung. No definite acute cardiopulmonary disease.  Cardiomegaly and thoracic aorta atherosclerosis.  Osteopenia.   Electronically  Signed   By: Curlene Dolphin M.D.   On: 03/03/2014 22:02   Ct Head Wo Contrast  03/03/2014   CLINICAL DATA:  Acute mental status changes. Prior history of stroke.  EXAM: CT HEAD WITHOUT CONTRAST  TECHNIQUE: Contiguous axial images were obtained from the base of the skull through the vertex without intravenous contrast.  COMPARISON:  01/07/2014, 01/06/2014, 11/07/1009.  FINDINGS: Remote large right middle cerebral artery distribution stroke with encephalomalacia in the right temporal lobe, right frontal lobe and right parietal lobe. There is evidence of extension of the stroke to involve more of the right frontal cortex and the right basal ganglia since the most recent examination 01/07/2014. No associated hemorrhage or hematoma. Mild mass effect, with effacement of cortical sulci in the right frontal lobe and minimal shift of the midline to the left approximating 3 mm. No extra-axial fluid collections. Moderate to severe cortical and deep atrophy and moderate to severe cerebellar atrophy, unchanged. Severe changes of small vessel disease of the white matter diffusely, unchanged.  No skull fracture or other focal osseous abnormality involving the skull. Visualized paranasal sinuses, bilateral mastoid air cells and bilateral middle ear cavities well-aerated. Extensive bilateral carotid siphon and vertebral artery atherosclerosis.  IMPRESSION: 1. Acute or subacute extension of the right middle cerebral artery distribution stroke in the right frontal lobe and basal ganglia, new since the 01/07/2014 examination, without associated hemorrhage or hematoma. 2. Minimal mass effect with effacement of cortical sulci in the right frontal lobe and slight midline shift to the left approximating 3 mm. 3. Otherwise, stable encephalomalacia involving the right temporal, parietal and posterior frontal lobes related to the remote large MCA distribution stroke. 4. Stable moderate to severe generalized atrophy and severe chronic  microvascular ischemic changes of the white matter.   Electronically Signed   By: Evangeline Dakin M.D.   On: 03/03/2014 15:09   Dg Abd Portable 1v  03/05/2014   CLINICAL DATA:  Diarrhea and pain.  EXAM: PORTABLE ABDOMEN - 1 VIEW  COMPARISON:  CT 01/20/2014.  FINDINGS: Soft tissue structures are unremarkable. Gas pattern is nonspecific. Stool  noted throughout the colon. No free air. Aortoiliac atherosclerotic vascular disease. Left iliac stent. Degenerative changes lumbar spine. Postsurgical changes both hips. Calcified injection granulomas.  IMPRESSION: Nonspecific exam. No evidence of significant bowel distention. No free air. Stool is present in the colon.   Electronically Signed   By: Marcello Moores  Register   On: 03/05/2014 14:43    Microbiology: Recent Results (from the past 240 hour(s))  MRSA PCR SCREENING     Status: None   Collection Time    03/03/14  7:16 PM      Result Value Ref Range Status   MRSA by PCR NEGATIVE  NEGATIVE Final   Comment:            The GeneXpert MRSA Assay (FDA     approved for NASAL specimens     only), is one component of a     comprehensive MRSA colonization     surveillance program. It is not     intended to diagnose MRSA     infection nor to guide or     monitor treatment for     MRSA infections.  URINE CULTURE     Status: None   Collection Time    03/05/14 11:19 AM      Result Value Ref Range Status   Specimen Description URINE, CATHETERIZED   Final   Special Requests NONE   Final   Culture  Setup Time     Final   Value: 03/05/2014 16:47     Performed at [facility]     Final   Value: NO GROWTH     Performed at [facility]   Culture     Final   Value: NO GROWTH     Performed at [facility]   Report Status 03/06/2014 FINAL   Final     Labs: Basic Metabolic Panel:  Recent Labs Lab 03/03/14 1420 03/05/14 0343  NA 137 141  K 3.9 3.8  CL 101 104  CO2 23 22  GLUCOSE 176* 90  BUN 27* 28*  CREATININE  1.10 0.85  CALCIUM 9.0 8.5   Liver Function Tests:  Recent Labs Lab 03/03/14 1420  AST 24  ALT 7  ALKPHOS 74  BILITOT 0.8  PROT 7.6  ALBUMIN 2.9*   No results found for this basename: LIPASE, AMYLASE,  in the last 168 hours No results found for this basename: AMMONIA,  in the last 168 hours CBC:  Recent Labs Lab 03/03/14 1420 03/05/14 0343 03/07/14 0548  WBC 9.2 7.0 11.5*  NEUTROABS 8.0*  --   --   HGB 12.2 11.9* 12.9  HCT 37.6 36.3 39.8  MCV 98.4 96.0 98.8  PLT 154 162 198   Cardiac Enzymes:  Recent Labs Lab 03/03/14 1420  TROPONINI <0.30   BNP: BNP (last 3 results) No results found for this basename: PROBNP,  in the last 8760 hours CBG: No results found for this basename: GLUCAP,  in the last 168 hours     Signed:  Cristal Ford  [facility] 03/07/2014, 11:32 AM

## 2014-03-07 NOTE — Progress Notes (Signed)
Clinical Social Work Department BRIEF PSYCHOSOCIAL ASSESSMENT 03/07/2014  Patient:  Rita Miller, Rita Miller     Account Number:  [id number]     Admit date:  03/03/2014  Clinical Social Worker:  Valda Lamb  Date/Time:  03/07/2014 08:32 AM  Referred by:  Physician  Date Referred:  03/07/2014 Referred for  Psychosocial assessment   Other Referral:   Interview type:  Family Other interview type:   CSW completed assessment with pt's daughter Rita Miller.    PSYCHOSOCIAL DATA Living Status:  FACILITY Admitted from facility:  [facility] Level of care:  [place] Primary support name:  Rita Miller Primary support relationship to patient:  CHILD, ADULT Degree of support available:   Pt's daughter appears actively involved in pt care.    CURRENT CONCERNS Current Concerns  Post-Acute Placement   Other Concerns:    SOCIAL WORK ASSESSMENT / PLAN Assisting CSW informed that pt is from [facility]    CSW visited pt room however unable to complete assessment with pt. CSW contacted pt's daughter Rita Miller who confirmed that pt has been at [facility] for 3 years and that the plan would be for her to return at discharge. Daughter also informed CSW that she did not want pt on Tramadol. CSW quickly informed daughter that it would be best to contact the nurse with the request. Daughter stated she has already made her aware. Daughter informed the daughter that any medically requests would be best expressed to the RN, daughter understanding.    CSW also contacted Hassan Rowan from [facility] who informed CSW that pt is able to return today.    CSW to fax dc summary to [facility] at [phone].   Assessment/plan status:  Psychosocial Support/Ongoing Assessment of Needs Other assessment/ plan:   Information/referral to community resources:   No resources needed at this time.    PATIENT'S/FAMILY'S RESPONSE TO PLAN OF CARE: Assessment unable to be completed with patient.  CSW completed assessment with pt's daughter  who is agreeable of intervention.    CSW to assist with dc today.       Hunt Oris, MSW, [place]

## 2014-03-07 NOTE — Telephone Encounter (Addendum)
FYI  Patient's daughter [name] called in very upset about her mother's recent stroke. States that she should have never been put on Eliquis and is asking why our clinic did this.  Reminded daughter that the patient was placed on Eliquis by a neurologist during her mother's previous admission.  No provider, myself included, ever placed prescribed this medication to the patient. She was also changed from Eliquis to coumadin at her current hospital admission.  The daughter is upset and wants to know why her mother can't be seen at the hospital downstairs so that Dr. Charlett Blake can take care of her mother.  Reminded her that there is only a free-standing ER here at [facility] and that we do not have an inpatient facility here. Discussed with the daughter that if she has concerns over her mother's care in the hospital, she needs to contact the hospital or [facility].  Reminded her of her mother's upcoming appointments with Dr. Charlett Blake and with Dr. Erlinda Hong at the stroke clinic.  Patient seems better after discussion.  Of note, patient's daughter seems highly intoxicated at time of call.

## 2014-03-07 NOTE — Telephone Encounter (Signed)
Debbie from [facility] called stating the pt had been released from the hospital and was coming back to the facility today. Debbie wanted to review medications. Meds reviewed. New medication are Peridex 15 ml bid, Colace 5 ml bid, Melatonin 5 mg at bedtime. Coumadin 2.5 mg daily. Verified that new medications were on pt's current list. INR will be checked on Monday.

## 2014-03-07 NOTE — Progress Notes (Signed)
CSW informed that pt is ready for dc today. DC packet placed in pt shadow chart. Daughter contacted and aware. No additional needs, CSW signing off. Hunt Oris, MSW, [place]

## 2014-03-07 NOTE — Discharge Instructions (Signed)

## 2014-03-07 NOTE — Progress Notes (Signed)
ANTICOAGULATION CONSULT NOTE - Follow Up Consult  Pharmacy Consult for heparin Indication: atrial fibrillation w/ MCA infarct  Labs:  Recent Labs  03/05/14 0343 03/06/14 0719 03/06/14 1945 03/07/14 0548  HGB 11.9*  --   --  12.9  HCT 36.3  --   --  39.8  PLT 162  --   --  198  APTT  --   --  55* 48*  LABPROT  --  18.5*  --  30.8*  INR  --  1.54*  --  2.96*  HEPARINUNFRC  --   --  0.82* 0.49  CREATININE 0.85  --   --   --     Assessment: 80yo female now w/ lower PTT (heparin level still affected by apixaban) despite increased heparin rate last night, no gtt issues overnight per RN.  Goal of Therapy:  Heparin level 0.3-0.5 units/ml   Plan:  Will increase heparin gtt by 2 units/kg/hr to 700 units/hr and check level in [place], PharmD, BCPS  03/07/2014,6:51 AM

## 2014-03-10 ENCOUNTER — Telehealth: Payer: Self-pay | Admitting: Family Medicine

## 2014-03-10 ENCOUNTER — Telehealth: Payer: Self-pay | Admitting: *Deleted

## 2014-03-10 DIAGNOSIS — I639 Cerebral infarction, unspecified: Secondary | ICD-10-CM

## 2014-03-10 NOTE — Telephone Encounter (Signed)
[facility] Triage Call Report Triage Record Num: [id number] Operator: Janit Pagan Patient Name: Aanchal Cope Call Date & Time: 03/08/2014 3:22:05PM Patient Phone: [phone] PCP: Drema Pry Patient Gender: Female PCP Fax : [phone] Patient DOB: [birth year] Practice Name: [facility] [place] Reason for Call: Caller: Brenda/LPN; PCP: Penni Homans (Family Practice); CB#: [phone]; Call regarding Shortness of breath, pt on inhaler. Wanting order for breathing treatment; Patient returning to unit after second CVA and is very weak. Is trying to give flovent and spiriva inhaler but does not seem to be strong enough to have inhalers to be effective. Daughter here all day, requesting order for Albuterol Inhaler or she will go home and get one, give Rx herself. Nurse noting crackling in lungs, dimished lung sounds. Current VS: oxygen sats 92 on 2 L oxygen, BP 100/64, p 91, R 18, temp 98.4 Triaged in Breathing problems Guideline - Activate EMS due to worsening SOB and new onset of fatigue, weakness. Facility requesting orders. Spoke with Dr Charlett Blake - Stop Spiriva, Add Albuterol Nebulizer Q4Hrs prn, Pulmicort nebulizer BID. Observe for oxygen saturation to drop or increased difficulty breathing, if facility or daughter feels needing ER, call back. Protocol(s) Used: Breathing Problems Recommended Outcome per Protocol: Activate EMS 911 Reason for Outcome: New or worsening shortness of breath/difficulty breathing AND new onset of fatigue, weakness, confusion, or change in ability to care out daily activities

## 2014-03-10 NOTE — Telephone Encounter (Signed)
Patient daughter called in stating that the nursing home is requesting an order for ice and an air mattress

## 2014-03-10 NOTE — Telephone Encounter (Signed)
Please check with nursing home I have no objection to ice or air mattress but do they actually want it and what is the ice for?

## 2014-03-11 ENCOUNTER — Telehealth: Payer: Self-pay

## 2014-03-11 DIAGNOSIS — Z515 Encounter for palliative care: Secondary | ICD-10-CM

## 2014-03-11 NOTE — Telephone Encounter (Signed)
I left a message with Helene Kelp to have the nurse call me back

## 2014-03-11 NOTE — Telephone Encounter (Signed)
Petite w/ [facility] left a message stating that pts daughter doesn't want patient to have Tylenol but patient is in a lot of pain and needs to have pain management.

## 2014-03-11 NOTE — Telephone Encounter (Signed)
I called and spoke to Rita Miller and she states patient is saying her whole body hurts and around her pacemaker but all her vitals are normal.  They can't give patient Tylenol because it was DC'd off of her medlist?  Please advise?

## 2014-03-11 NOTE — Telephone Encounter (Signed)
RX and ice instructions sent to [facility]

## 2014-03-11 NOTE — Telephone Encounter (Signed)
Restart Tylenol 500 mg tabs 1 tab po tid x 7 days then change to 1 tab po tid prn pain after that and see if that helps

## 2014-03-11 NOTE — Telephone Encounter (Signed)
Petite called back from [facility] stating that pt is complaining that her leg (didn't say which one) is hurting and the daughter would like ice applied to it. And they need an RX for the air mattress because this is purchased outside of the facility.  Please advise?

## 2014-03-11 NOTE — Telephone Encounter (Signed)
OK to write a DME rx for air mattress for recent CVA and hi risk of decubitus ulcer and ice to affected area tid prn pain x 10 minutes x 30 days

## 2014-03-12 ENCOUNTER — Telehealth: Payer: Self-pay | Admitting: *Deleted

## 2014-03-12 NOTE — Telephone Encounter (Signed)
Nurse was notified at the nursing home to restart tylenol.  Nurse wanted to know when pt needed to get her coumadin lab drawn, because her coumadin dose has been changed. Please advise

## 2014-03-12 NOTE — Telephone Encounter (Signed)
No further instructions

## 2014-03-12 NOTE — Telephone Encounter (Signed)
Please advise if further instructions:  [facility] Triage Call Report Triage Record Num: [id number] Operator: Claudine Mouton Patient Name: Rita Miller Call Date & Time: 03/11/2014 5:15:16PM Patient Phone: [phone] PCP: Drema Pry Patient Gender: Female PCP Fax : [phone] Patient DOB: [birth year] Practice Name: [facility] [place] Reason for Call: Caller: Linda/LPN; PCP: Penni Homans (Family Practice); CB#: [phone]; Call regarding INR 2. 88 PT 27. 9; Per standing orders, No change in coumadin dose. Call office in am for any futher orders. Protocol(s) Used: PCP Calls, No Triage (Adult) Recommended Outcome per Protocol: Call Provider within 24 Hours Reason for Outcome: Lab calling with test results Care Advice:

## 2014-03-12 NOTE — Telephone Encounter (Signed)
Patient's daughter, [name], called this afternoon after finding out that Tylenol 500 mg TID had been re-started [believing that [name] had given the order] voicing her opinion that "this was too much Tylenol for pt's weak heart & pacemaker and that Dr. Shawna Orleans knew this and has been her doctor since the beginning [??] and that she wants the dosage lowered by half". Explained to patient first, that [name] did not make medication change, nor would he unless during a OV as needed, as Dr. Charlett Blake is patient's PCP [and that Dr. Shawna Orleans was previous PCP and then Dr. Elizebeth Koller, as I was his Assistant and knew that for sure] and that after Dr.Hodgin went out on medical leave that patient then transferred to Dr. Frederik Pear care and is still currently, so this would be Dr. Frederik Pear decision as her medical provider. Caller continued to ask for agreement with her on this matter, kept reiterating that I am not the provider and I cannot speak and/or answer for a provider. Informed caller that her concerns would be forwarded to provider and that we would call her with response Kerin Salen to LMOVM]/SLS Please Advise.

## 2014-03-12 NOTE — Telephone Encounter (Signed)
This is not a significant amount of Tylenol even in her health and given her c/o it is my opinion we should try and make her comfortable but they insist I will  D/c the Tylenol but anything else we would use would have even greater risks

## 2014-03-13 NOTE — Telephone Encounter (Signed)
See note

## 2014-03-13 NOTE — Telephone Encounter (Signed)
I do not know what to tell her. There are very limited options. The hospital lists Percocet as an allergy causing hallucinations. The most likely ingredient to cause this is the Oxycodone which is related to hydrocodone, fentanyl and alll of the narcotics so I am afraid if we try anything else she will have hallucinations. We can d/c the Tylenol and try a 5 day trial of Tramadol 50 mg 1 tab po bid prn severe pain. Then have the home let us know how she did. This is stronger than Tylenol

## 2014-03-13 NOTE — Telephone Encounter (Signed)
Spoke with pt's daughter, [name], and informed of provider's response; Ms. Tye Savoy once again began to inform me that "Dr. Shawna Orleans says that this is too much for patient's weak heart" and once again caller was reminded that we can not attest to conversations between her and Dr. Shawna Orleans when he was pt's former PCP and it has been years since patient was under his care. Ms. Tye Savoy would like to know if patient can be prescribed a suppository form of Tylenol, as "the nursing facility will just dope her mother up with pills" but then in the same breath suggested that we prescribe more sleeping medication to keep pt asleep, as to avoid feeling her pain as an option; caller was informed that this was an unscrupulous suggestion to make to a provider. Caller also stated that in the hospital, she was told by provider that patient was allergic to Tylenol; asked her what was the reasoning/reaction given for this & she stated that she did not know. Explained that provider would not only give a reason for noting that a patient was allergic to a medication, but also would have entered the medication into the pt's EMR Allergy list and this had not been done. Caller stated that maybe "[facility] is not as connected as they think and we should look at the hospital encounter for notation of this". Informed her that that could definitely be done [look at physician note]. Informed caller that this information would be forwarded to PCP for response/SLS Please Advise.

## 2014-03-14 ENCOUNTER — Encounter: Payer: Self-pay | Admitting: Physician Assistant

## 2014-03-14 ENCOUNTER — Other Ambulatory Visit: Payer: Self-pay | Admitting: *Deleted

## 2014-03-14 ENCOUNTER — Encounter: Payer: Self-pay | Admitting: *Deleted

## 2014-03-14 ENCOUNTER — Ambulatory Visit (INDEPENDENT_AMBULATORY_CARE_PROVIDER_SITE_OTHER): Payer: Medicare Other | Admitting: Physician Assistant

## 2014-03-14 VITALS — BP 125/56 | HR 82 | Temp 97.4°F | Resp 14 | Ht 59.0 in

## 2014-03-14 DIAGNOSIS — I6350 Cerebral infarction due to unspecified occlusion or stenosis of unspecified cerebral artery: Secondary | ICD-10-CM

## 2014-03-14 DIAGNOSIS — I639 Cerebral infarction, unspecified: Secondary | ICD-10-CM

## 2014-03-14 DIAGNOSIS — I4891 Unspecified atrial fibrillation: Secondary | ICD-10-CM

## 2014-03-14 DIAGNOSIS — I2510 Atherosclerotic heart disease of native coronary artery without angina pectoris: Secondary | ICD-10-CM

## 2014-03-14 DIAGNOSIS — I10 Essential (primary) hypertension: Secondary | ICD-10-CM

## 2014-03-14 MED ORDER — ACETAMINOPHEN 325 MG RE SUPP: 325.0000 mg | Freq: Three times a day (TID) | RECTAL | Status: DC | PRN

## 2014-03-14 MED ORDER — VITAMIN E 500 UNIT/GM POWD: 400.0000 [IU] | Freq: Every day | Status: DC

## 2014-03-14 NOTE — Telephone Encounter (Signed)
Ivin Booty and [name] will inform patient at appt today

## 2014-03-14 NOTE — Progress Notes (Signed)
Read, reviewed, edited and agree with student's findings and recommendations.  Harlie Buening B. Estill Llerena, PT, DPT #[phone]  

## 2014-03-14 NOTE — Progress Notes (Signed)
Pre visit review using our clinic review tool, if applicable. No additional management support is needed unless otherwise documented below in the visit note/SLS  

## 2014-03-14 NOTE — Telephone Encounter (Signed)
Patient's daughter not present at today's visit due to illness.  Discussed medication options with patient and nursing staff.  Called patient's daughter to discuss pain management.  Daughter wishing to decrease dose of Tylenol and consider alternative route of administration giving patient's dysphagia.  Discussed this with patient's PCP who is ok with allowing [place] to change to a PR acetaminophen.  New prescription for Acephen 325 mg suppository sent to [facility].  Can be taken up to 3 times daily if needed.

## 2014-03-24 ENCOUNTER — Encounter: Payer: Self-pay | Admitting: *Deleted

## 2014-03-24 ENCOUNTER — Other Ambulatory Visit: Payer: Self-pay | Admitting: *Deleted

## 2014-03-24 ENCOUNTER — Encounter: Payer: Self-pay | Admitting: Family Medicine

## 2014-03-24 ENCOUNTER — Telehealth: Payer: Self-pay | Admitting: Physician Assistant

## 2014-03-24 MED ORDER — ACETAMINOPHEN 325 MG RE SUPP: 325.0000 mg | Freq: Two times a day (BID) | RECTAL | Status: DC | PRN

## 2014-03-24 NOTE — Telephone Encounter (Signed)
Spoke with daughter regarding concerns.  Patient is not comatose. Patient seems somnolent after being given PR acetaminophen. Is prescribed up to TID only if needed.  Daughter feels home is giving to patient around the clock.  Is wanting [place] to send a new order to reduce dosing of tylenol.  I have sent over Rx change to PR Acetaminophen twice daily if needed for pain.

## 2014-03-24 NOTE — Progress Notes (Signed)
Physician Order sent to [facility] for new dosing directions on APAP suppositories per provider VO/SLS

## 2014-03-24 NOTE — Telephone Encounter (Signed)
Daughter calling stating her mother is comatose due to Tylenol suppositories. Spoke with [name] and was advise to send patient to ER. Daughter does not think she needs to go to ER, she refused to speak with Call a nurse and is requesting to speak with [facility].

## 2014-03-25 ENCOUNTER — Telehealth: Payer: Self-pay

## 2014-03-25 NOTE — Telephone Encounter (Signed)
Per MD:  we received a urine culture from [facility]- Dr Charlett Blake needs to make sure this infection got taken care of.   Please call Rita Miller is on my desk

## 2014-04-01 ENCOUNTER — Encounter: Payer: Self-pay | Admitting: *Deleted

## 2014-04-01 ENCOUNTER — Telehealth: Payer: Self-pay

## 2014-04-01 NOTE — Telephone Encounter (Signed)
Rita Miller with [facility] left a message stating that pt has not been able to get her Pulmicort because the pharmacy needs to know if its .25 mg, .5 mg or 1 mg?  Rita Miller stated on message that the pharmacy has sent something 3 times and we haven't responded?  Please advise?

## 2014-04-01 NOTE — Telephone Encounter (Signed)
FYI:   Just realized these lab results were from 02-16-14.  Patient has been seen several times and admitted to hospital since this date.  I'm sure this was taken care of?

## 2014-04-01 NOTE — Telephone Encounter (Signed)
i will send an order to [facility]

## 2014-04-01 NOTE — Telephone Encounter (Signed)
I believe she was treated for this I just do not fully trust the home. Let's give them an order for a new UA and urine C&S

## 2014-04-02 NOTE — Telephone Encounter (Signed)
[facility] called RE: this matter and provider response given; understood & agreed/SLS They are requesting response from fax [handwritten notes] RE: vitamins, etc sent last week, as they need to know d/t patient's inability to swallow medications/SLS Please Advise.

## 2014-04-02 NOTE — Telephone Encounter (Signed)
I have not seen this before Pulmicort Nebs 0.5 via neb po bid

## 2014-04-02 NOTE — Telephone Encounter (Signed)
I wrote a note and it was faxed over to d/c all of the supplements the day I got it

## 2014-04-03 NOTE — Telephone Encounter (Signed)
Resent provider D/C Orders via fax to [place] at [phone] and called to inform nurse of these orders/SLS

## 2014-04-07 LAB — PROTIME-INR: INR: 11.4 — AB (ref 0.9–1.1)

## 2014-04-07 NOTE — Patient Instructions (Addendum)
Please continue care.  I will call your daughter regarding your visit today. I hope you feel better!

## 2014-04-07 NOTE — Progress Notes (Signed)
Patient presents to clinic today with nursing home attendant for Hospital Follow-up.  Patient was evaluated in clinic by her PCP on 03/03/14.  Was triaged to the ER for a more acute workup.  Patient admitted to hospital for CVA workup. CT scan obtained revealing acute Right MCA stroke with minimal mass effect.  Patient subsequently admitted to the hospital for management, and Neurology was consulted.  Patient was stabilized and placed on Coumadin for anticoagulation as Eliquis is not indicated for those with heart valves. Echocardiogram revealed severe aortic stenosis.  Cardiology (Dr. Kirk Ruths) consulted.  Patient not a surgical candidate.  Patient discharged on anticoagulation.  Given order for PT/OT and ST.  Daughter denies Palliative measures for patient as discharge.  Since discharge patient is being evaluated by PT/OT twice weekly to build up strength in her extremities.  Is currently on a Dysphagia I diet until re-evaluation by ST. Denies new or worsening symptoms.  Patient has had INR checked recently on 03/10/14.  INR at 2.88.  Instructions on doing given by PCP.  Past Medical History  Diagnosis Date  . CAD (coronary artery disease)   . History of CVA (cerebrovascular accident)   . Atrial fibrillation     peristent  . Hyperlipemia   . COPD (chronic obstructive pulmonary disease)   . Epistaxis     recurrent  . Osteoporosis   . Hypothyroidism   . History of pulmonary embolism   . History of colonic diverticulitis   . History of breast cancer   . Asthma   . Iron deficiency anemia   . History of transfusion of whole blood   . LBBB (left bundle branch block)     history of  . Aortal stenosis     severe  . Pacemaker     bradycardia s/p pacemaker implant  . PAD (peripheral artery disease)     left leg occluded viabahn stents x 3 left SFA  . Tachycardia-bradycardia syndrome     s/p PPM  . Cancer   . Arthritis   . Stroke   . HTN (hypertension) 09/25/2012  . HTN (hypertension)  09/25/2012  . Esophageal reflux 01/26/2014  . Left-sided visual neglect 03/03/2014    Current Outpatient Prescriptions on File Prior to Visit  Medication Sig Dispense Refill  . albuterol (PROVENTIL HFA;VENTOLIN HFA) 108 (90 BASE) MCG/ACT inhaler Inhale 2 puffs into the lungs every 6 (six) hours as needed for wheezing or shortness of breath.  1 Inhaler  3  . antiseptic oral rinse (BIOTENE) LIQD 15 mLs by Mouth Rinse route 2 times daily at 12 noon and 4 pm.      . chlorhexidine (PERIDEX) 0.12 % solution 15 mLs by Mouth Rinse route 2 (two) times daily.  120 mL  0  . docusate (COLACE) 50 MG/5ML liquid Take 20 mLs (200 mg total) by mouth 2 (two) times daily.  100 mL  0  . feeding supplement, ENSURE COMPLETE, (ENSURE COMPLETE) LIQD Take 237 mLs by mouth 2 (two) times daily between meals.      . fluticasone (FLOVENT HFA) 44 MCG/ACT inhaler Inhale 2 puffs into the lungs daily.       . Rivastigmine (EXELON) 13.3 MG/24HR PT24 Place 1 patch onto the skin daily.      Marland Kitchen guaifenesin (ROBITUSSIN) 100 MG/5ML syrup Take 100 mg by mouth every 4 (four) hours as needed for cough.      . levothyroxine (SYNTHROID, LEVOTHROID) 75 MCG tablet Take 75 mcg by mouth daily before breakfast.      .  Melatonin 5 MG TABS Take 5 mg by mouth at bedtime.      . memantine (NAMENDA) 10 MG tablet Take 10 mg by mouth 2 (two) times daily.       . polyethylene glycol (MIRALAX / GLYCOLAX) packet Take 17 g by mouth every other day.       . pravastatin (PRAVACHOL) 20 MG tablet Take 20 mg by mouth daily.      . ranitidine (ZANTAC) 300 MG tablet Take 300 mg by mouth at bedtime.      . sodium chloride (OCEAN) 0.65 % SOLN nasal spray Place 1 spray into both nostrils 2 (two) times daily.      Marland Kitchen tiotropium (SPIRIVA) 18 MCG inhalation capsule Place 18 mcg into inhaler and inhale daily.       . Vaginal Lubricant (REPLENS) GEL Place 1 application vaginally daily as needed (vaginal dryness).      . warfarin (COUMADIN) 2.5 MG tablet Take 1 tablet  (2.5 mg total) by mouth daily.       No current facility-administered medications on file prior to visit.    Allergies  Allergen Reactions  . Donepezil Hydrochloride     REACTION: heart fluttering  . Morphine Sulfate   . Oxycodone-Acetaminophen     REACTION: Hallucinations    Family History  Problem Relation Age of Onset  . Breast cancer    . Colon cancer    . Diabetes    . Hyperlipidemia    . Hypertension    . Heart disease Mother     unknown heart disease    History   Social History  . Marital Status: Widowed    Spouse Name: N/A    Number of Children: N/A  . Years of Education: N/A   Social History Main Topics  . Smoking status: Former Smoker    Quit date: 02/28/1994  . Smokeless tobacco: Never Used     Comment: Quit >20 years  . Alcohol Use: No  . Drug Use: No  . Sexual Activity: None   Other Topics Concern  . None   Social History Narrative   Retired   Widow    1 daughter            Review of Systems - See HPI.  All other ROS are negative.  BP 125/56  Pulse 82  Temp(Src) 97.4 F (36.3 C) (Oral)  Resp 14  Ht _0  (1.499 m)  SpO2 94%  Physical Exam  Vitals reviewed. Cardiovascular: Normal rate, regular rhythm and intact distal pulses.   Murmur heard. Pulmonary/Chest: Effort normal and breath sounds normal. No respiratory distress. She has no wheezes. She has no rales. She exhibits no tenderness.  Neurological: She is alert. She has normal sensation. She displays weakness. She displays facial symmetry. No cranial nerve deficit.  Alert to person and place but not time. Patient is wheelchair bound.  Strength is diminished at 3/5 but LLE is 2/5.  Skin: Skin is warm and dry. No rash noted.    Recent Results (from the past 2160 hour(s))  PROTIME-INR     Status: Abnormal   Collection Time    01/17/14  1:52 PM      Result Value Ref Range   Prothrombin Time 18.9 (*) 11.6 - 15.2 seconds   INR 1.62 (*) <1.50   Comment: The INR is of principal  utility in following patients on stable doses     of oral anticoagulants.  The therapeutic range is generally 2.0 to  3.0, but may be 3.0 to 4.0 in patients with mechanical cardiac valves,     recurrent embolisms and antiphospholipid antibodies (including lupus     inhibitors).  CBC     Status: Abnormal   Collection Time    01/17/14  1:52 PM      Result Value Ref Range   WBC 6.4  4.0 - 10.5 K/uL   RBC 3.56 (*) 3.87 - 5.11 MIL/uL   Hemoglobin 11.2 (*) 12.0 - 15.0 g/dL   HCT 32.9 (*) 36.0 - 46.0 %   MCV 92.4  78.0 - 100.0 fL   MCH 31.5  26.0 - 34.0 pg   MCHC 34.0  30.0 - 36.0 g/dL   RDW 16.9 (*) 11.5 - 15.5 %   Platelets 313  150 - 400 K/uL  RENAL FUNCTION PANEL     Status: None   Collection Time    01/17/14  1:52 PM      Result Value Ref Range   Sodium 135  135 - 145 mEq/L   Potassium 4.3  3.5 - 5.3 mEq/L   Chloride 100  96 - 112 mEq/L   CO2 25  19 - 32 mEq/L   Glucose, Bld 77  70 - 99 mg/dL   BUN 16  6 - 23 mg/dL   Creat 1.00  0.50 - 1.10 mg/dL   Albumin 3.5  3.5 - 5.2 g/dL   Calcium 8.9  8.4 - 10.5 mg/dL   Phosphorus 3.8  2.3 - 4.6 mg/dL  ETHANOL     Status: None   Collection Time    03/03/14  2:20 PM      Result Value Ref Range   Alcohol, Ethyl (B) <11  0 - 11 mg/dL   Comment:            LOWEST DETECTABLE LIMIT FOR     SERUM ALCOHOL IS 11 mg/dL     FOR MEDICAL PURPOSES ONLY  PROTIME-INR     Status: Abnormal   Collection Time    03/03/14  2:20 PM      Result Value Ref Range   Prothrombin Time 15.4 (*) 11.6 - 15.2 seconds   INR 1.22  0.00 - 1.49  APTT     Status: None   Collection Time    03/03/14  2:20 PM      Result Value Ref Range   aPTT 37  24 - 37 seconds   Comment:            IF BASELINE aPTT IS ELEVATED,     SUGGEST PATIENT RISK ASSESSMENT     BE USED TO DETERMINE APPROPRIATE     ANTICOAGULANT THERAPY.  CBC     Status: Abnormal   Collection Time    03/03/14  2:20 PM      Result Value Ref Range   WBC 9.2  4.0 - 10.5 K/uL   RBC 3.82 (*) 3.87 -  5.11 MIL/uL   Hemoglobin 12.2  12.0 - 15.0 g/dL   HCT 37.6  36.0 - 46.0 %   MCV 98.4  78.0 - 100.0 fL   MCH 31.9  26.0 - 34.0 pg   MCHC 32.4  30.0 - 36.0 g/dL   RDW 14.6  11.5 - 15.5 %   Platelets 154  150 - 400 K/uL  DIFFERENTIAL     Status: Abnormal   Collection Time    03/03/14  2:20 PM      Result Value Ref Range   Neutrophils Relative %  87 (*) 43 - 77 %   Neutro Abs 8.0 (*) 1.7 - 7.7 K/uL   Lymphocytes Relative 6 (*) 12 - 46 %   Lymphs Abs 0.6 (*) 0.7 - 4.0 K/uL   Monocytes Relative 3  3 - 12 %   Monocytes Absolute 0.3  0.1 - 1.0 K/uL   Eosinophils Relative 4  0 - 5 %   Eosinophils Absolute 0.4  0.0 - 0.7 K/uL   Basophils Relative 0  0 - 1 %   Basophils Absolute 0.0  0.0 - 0.1 K/uL  COMPREHENSIVE METABOLIC PANEL     Status: Abnormal   Collection Time    03/03/14  2:20 PM      Result Value Ref Range   Sodium 137  137 - 147 mEq/L   Potassium 3.9  3.7 - 5.3 mEq/L   Chloride 101  96 - 112 mEq/L   CO2 23  19 - 32 mEq/L   Glucose, Bld 176 (*) 70 - 99 mg/dL   BUN 27 (*) 6 - 23 mg/dL   Creatinine, Ser 1.10  0.50 - 1.10 mg/dL   Calcium 9.0  8.4 - 10.5 mg/dL   Total Protein 7.6  6.0 - 8.3 g/dL   Albumin 2.9 (*) 3.5 - 5.2 g/dL   AST 24  0 - 37 U/L   ALT 7  0 - 35 U/L   Alkaline Phosphatase 74  39 - 117 U/L   Total Bilirubin 0.8  0.3 - 1.2 mg/dL   GFR calc non Af Amer 46 (*) >90 mL/min   GFR calc Af Amer 53 (*) >90 mL/min   Comment: (NOTE)     The eGFR has been calculated using the CKD EPI equation.     This calculation has not been validated in all clinical situations.     eGFR's persistently <90 mL/min signify possible Chronic Kidney     Disease.   Anion gap 13  5 - 15  TROPONIN I     Status: None   Collection Time    03/03/14  2:20 PM      Result Value Ref Range   Troponin I <0.30  <0.30 ng/mL   Comment:            Due to the release kinetics of cTnI,     a negative result within the first hours     of the onset of symptoms does not rule out     myocardial  infarction with certainty.     If myocardial infarction is still suspected,     repeat the test at appropriate intervals.  MRSA PCR SCREENING     Status: None   Collection Time    03/03/14  7:16 PM      Result Value Ref Range   MRSA by PCR NEGATIVE  NEGATIVE   Comment:            The GeneXpert MRSA Assay (FDA     approved for NASAL specimens     only), is one component of a     comprehensive MRSA colonization     surveillance program. It is not     intended to diagnose MRSA     infection nor to guide or     monitor treatment for     MRSA infections.  HEMOGLOBIN A1C     Status: None   Collection Time    03/04/14  3:16 AM      Result Value Ref Range   Hemoglobin  A1C 5.5  <5.7 %   Comment: (NOTE)                                                                               According to the ADA Clinical Practice Recommendations for 2011, when     HbA1c is used as a screening test:      >=6.5%   Diagnostic of Diabetes Mellitus               (if abnormal result is confirmed)     5.7-6.4%   Increased risk of developing Diabetes Mellitus     References:Diagnosis and Classification of Diabetes Mellitus,Diabetes     OJJK,0938,18(EXHBZ 1):S62-S69 and Standards of Medical Care in             Diabetes - 2011,Diabetes JIRC,7893,81 (Suppl 1):S11-S61.   Mean Plasma Glucose 111  <117 mg/dL   Comment: Performed at [facility]     Status: None   Collection Time    03/04/14  3:16 AM      Result Value Ref Range   Cholesterol 148  0 - 200 mg/dL   Triglycerides 79  <150 mg/dL   HDL 58  >39 mg/dL   Total CHOL/HDL Ratio 2.6     VLDL 16  0 - 40 mg/dL   LDL Cholesterol 74  0 - 99 mg/dL   Comment:            Total Cholesterol/HDL:CHD Risk     Coronary Heart Disease Risk Table                         Men   Women      1/2 Average Risk   3.4   3.3      Average Risk       5.0   4.4      2 X Average Risk   9.6   7.1      3 X Average Risk  23.4   11.0                Use the  calculated Patient Ratio     above and the CHD Risk Table     to determine the patient's CHD Risk.                ATP III CLASSIFICATION (LDL):      <100     mg/dL   Optimal      100-129  mg/dL   Near or Above                        Optimal      130-159  mg/dL   Borderline      160-189  mg/dL   High      >190     mg/dL   Very High  CBC     Status: Abnormal   Collection Time    03/05/14  3:43 AM      Result Value Ref Range   WBC 7.0  4.0 - 10.5 K/uL   RBC 3.78 (*) 3.87 - 5.11 MIL/uL   Hemoglobin 11.9 (*)  12.0 - 15.0 g/dL   HCT 36.3  36.0 - 46.0 %   MCV 96.0  78.0 - 100.0 fL   MCH 31.5  26.0 - 34.0 pg   MCHC 32.8  30.0 - 36.0 g/dL   RDW 14.6  11.5 - 15.5 %   Platelets 162  150 - 400 K/uL  BASIC METABOLIC PANEL     Status: Abnormal   Collection Time    03/05/14  3:43 AM      Result Value Ref Range   Sodium 141  137 - 147 mEq/L   Potassium 3.8  3.7 - 5.3 mEq/L   Chloride 104  96 - 112 mEq/L   CO2 22  19 - 32 mEq/L   Glucose, Bld 90  70 - 99 mg/dL   BUN 28 (*) 6 - 23 mg/dL   Creatinine, Ser 0.85  0.50 - 1.10 mg/dL   Calcium 8.5  8.4 - 10.5 mg/dL   GFR calc non Af Amer 63 (*) >90 mL/min   GFR calc Af Amer 72 (*) >90 mL/min   Comment: (NOTE)     The eGFR has been calculated using the CKD EPI equation.     This calculation has not been validated in all clinical situations.     eGFR's persistently <90 mL/min signify possible Chronic Kidney     Disease.   Anion gap 15  5 - 15  URINE CULTURE     Status: None   Collection Time    03/05/14 11:19 AM      Result Value Ref Range   Specimen Description URINE, CATHETERIZED     Special Requests NONE     Culture  Setup Time       Value: 03/05/2014 16:47     Performed at [facility]       Value: NO GROWTH     Performed at [facility]   Culture       Value: NO GROWTH     Performed at [facility]   Report Status 03/06/2014 FINAL    PROTIME-INR     Status: Abnormal   Collection Time     03/06/14  7:19 AM      Result Value Ref Range   Prothrombin Time 18.5 (*) 11.6 - 15.2 seconds   INR 1.54 (*) 0.00 - 1.49  APTT     Status: Abnormal   Collection Time    03/06/14  7:45 PM      Result Value Ref Range   aPTT 55 (*) 24 - 37 seconds   Comment:            IF BASELINE aPTT IS ELEVATED,     SUGGEST PATIENT RISK ASSESSMENT     BE USED TO DETERMINE APPROPRIATE     ANTICOAGULANT THERAPY.  HEPARIN LEVEL (UNFRACTIONATED)     Status: Abnormal   Collection Time    03/06/14  7:45 PM      Result Value Ref Range   Heparin Unfractionated 0.82 (*) 0.30 - 0.70 IU/mL   Comment:            IF HEPARIN RESULTS ARE BELOW     EXPECTED VALUES, AND PATIENT     DOSAGE HAS BEEN CONFIRMED,     SUGGEST FOLLOW UP TESTING     OF ANTITHROMBIN III LEVELS.  PROTIME-INR     Status: Abnormal   Collection Time    03/07/14  5:48 AM  Result Value Ref Range   Prothrombin Time 30.8 (*) 11.6 - 15.2 seconds   INR 2.96 (*) 0.00 - 1.49  CBC     Status: Abnormal   Collection Time    03/07/14  5:48 AM      Result Value Ref Range   WBC 11.5 (*) 4.0 - 10.5 K/uL   RBC 4.03  3.87 - 5.11 MIL/uL   Hemoglobin 12.9  12.0 - 15.0 g/dL   HCT 39.8  36.0 - 46.0 %   MCV 98.8  78.0 - 100.0 fL   MCH 32.0  26.0 - 34.0 pg   MCHC 32.4  30.0 - 36.0 g/dL   RDW 14.4  11.5 - 15.5 %   Platelets 198  150 - 400 K/uL  HEPARIN LEVEL (UNFRACTIONATED)     Status: None   Collection Time    03/07/14  5:48 AM      Result Value Ref Range   Heparin Unfractionated 0.49  0.30 - 0.70 IU/mL   Comment:            IF HEPARIN RESULTS ARE BELOW     EXPECTED VALUES, AND PATIENT     DOSAGE HAS BEEN CONFIRMED,     SUGGEST FOLLOW UP TESTING     OF ANTITHROMBIN III LEVELS.  APTT     Status: Abnormal   Collection Time    03/07/14  5:48 AM      Result Value Ref Range   aPTT 48 (*) 24 - 37 seconds   Comment:            IF BASELINE aPTT IS ELEVATED,     SUGGEST PATIENT RISK ASSESSMENT     BE USED TO DETERMINE APPROPRIATE      ANTICOAGULANT THERAPY.  HEPARIN LEVEL (UNFRACTIONATED)     Status: None   Collection Time    03/07/14  2:39 PM      Result Value Ref Range   Heparin Unfractionated 0.33  0.30 - 0.70 IU/mL   Comment:            IF HEPARIN RESULTS ARE BELOW     EXPECTED VALUES, AND PATIENT     DOSAGE HAS BEEN CONFIRMED,     SUGGEST FOLLOW UP TESTING     OF ANTITHROMBIN III LEVELS.  APTT     Status: Abnormal   Collection Time    03/07/14  2:39 PM      Result Value Ref Range   aPTT 60 (*) 24 - 37 seconds   Comment:            IF BASELINE aPTT IS ELEVATED,     SUGGEST PATIENT RISK ASSESSMENT     BE USED TO DETERMINE APPROPRIATE     ANTICOAGULANT THERAPY.    Assessment/Plan: Atrial fibrillation Rate controlled.  Continue current regimen.  Continue Coumadin as directed.  Will check PT/INR every 2 weeks for the next month.  HTN (hypertension) Controlled.  Do not feel Imdur should be started at present time as noted in discharge summary.  CVA (cerebral vascular accident) At location of right MCA.  Continue anticoagulation.  Continue ST/PT/OT.  Will speak with daughter concerning pain management and Palliative care consultation.  Follow-up with PCP in 1 month.

## 2014-04-08 ENCOUNTER — Encounter: Payer: Self-pay | Admitting: Family Medicine

## 2014-04-11 ENCOUNTER — Telehealth: Payer: Self-pay | Admitting: Physician Assistant

## 2014-04-11 DIAGNOSIS — I639 Cerebral infarction, unspecified: Secondary | ICD-10-CM | POA: Insufficient documentation

## 2014-04-11 NOTE — Assessment & Plan Note (Signed)
At location of right MCA.  Continue anticoagulation.  Continue ST/PT/OT.  Will speak with daughter concerning pain management and Palliative care consultation.  Follow-up with PCP in 1 month.

## 2014-04-11 NOTE — Telephone Encounter (Signed)
Tried to reach [facility] concerning PT/INR results. Was kept on hold for 20 minutes. Dr. Charlett Blake was only given copy of PT results.  We need INR results. If they were not obtained then a repeat PT/INR must be drawn and faxed to Dr. Charlett Blake.  Please try to reach the nurse at [facility].

## 2014-04-11 NOTE — Telephone Encounter (Signed)
[place] they will have the nurse call back

## 2014-04-11 NOTE — Assessment & Plan Note (Signed)
Rate controlled.  Continue current regimen.  Continue Coumadin as directed.  Will check PT/INR every 2 weeks for the next month.

## 2014-04-11 NOTE — Assessment & Plan Note (Signed)
Controlled.  Do not feel Imdur should be started at present time as noted in discharge summary.

## 2014-04-16 ENCOUNTER — Telehealth: Payer: Self-pay | Admitting: Family Medicine

## 2014-04-16 NOTE — Telephone Encounter (Signed)
[facility] is needing to get orders changed for the pt, and states they have been trying to call in a INR for pt, but has not gotten a response yet. Would like to speak with someone asap.

## 2014-04-16 NOTE — Telephone Encounter (Signed)
The paperwork was just faxed to [facility]

## 2014-04-17 ENCOUNTER — Encounter: Payer: Self-pay | Admitting: Family Medicine

## 2014-04-17 ENCOUNTER — Encounter: Payer: Self-pay | Admitting: Neurology

## 2014-04-17 ENCOUNTER — Ambulatory Visit (INDEPENDENT_AMBULATORY_CARE_PROVIDER_SITE_OTHER): Payer: Medicare Other | Admitting: Neurology

## 2014-04-17 VITALS — BP 147/61 | HR 77

## 2014-04-17 DIAGNOSIS — I350 Nonrheumatic aortic (valve) stenosis: Secondary | ICD-10-CM

## 2014-04-17 DIAGNOSIS — I482 Chronic atrial fibrillation, unspecified: Secondary | ICD-10-CM

## 2014-04-17 DIAGNOSIS — I6350 Cerebral infarction due to unspecified occlusion or stenosis of unspecified cerebral artery: Secondary | ICD-10-CM

## 2014-04-17 DIAGNOSIS — I4891 Unspecified atrial fibrillation: Secondary | ICD-10-CM

## 2014-04-17 DIAGNOSIS — I359 Nonrheumatic aortic valve disorder, unspecified: Secondary | ICD-10-CM

## 2014-04-17 DIAGNOSIS — I639 Cerebral infarction, unspecified: Secondary | ICD-10-CM

## 2014-04-17 NOTE — Progress Notes (Signed)
STROKE NEUROLOGY FOLLOW UP NOTE  NAME: Rita Miller DOB: 1930/08/07  REASON FOR VISIT: stroke follow up HISTORY FROM: nurse aide and chart  Today we had the pleasure of seeing Rita Miller in follow-up at our Neurology Clinic. Pt was accompanied by nurse aide.   History Summary Rita Miller is a 80 y.o. 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 CT concerning for acute or subacute extension of the right middle cerebral artery distribution stroke.   She was admitted early June this year for decreased responsiveness and left-sided weakness, and a CT of the head showed right MCA large encephalomalacia, indicating previous stroke on the right MCA territory. Exactly what date she had the stroke was not known. She was on Coumadin before admission in June, but INR was subtherapeutic, so it was changed to Eliquis. During the admission in July, she was seen by cardiology Dr. Stanford Breed, Eliquis was changed back to Coumadin because of very severe aortic stenosis.   Interval History After discharge, she was sent back to SNF with frequent INR checking. However, the last INR was on 04/07/14 showed 11.4 on our chart. We called the SNF and nurse said her INR was actually 1.14 and PT was 11.4. It meant that her INR still subtherapeutic, but her Coumadin dose has not been adjusted.   Overall, her condition was stable. She had a PT OT in SNF, and she made some progress on the left lower extremity and upper extremity strengths. However, she still has significant weakness, and she was still wheelchair-bound, and not able to use left arm.  REVIEW OF SYSTEMS: Full 14 system review of systems performed and notable only for those listed below and in HPI above, all others are negative:    Constitutional: N/A  Cardiovascular: N/A  Ear/Nose/Throat: N/A  Skin: N/A  Eyes: N/A  Respiratory: N/A  Gastroitestinal: N/A  Genitourinary: N/A Hematology/Lymphatic: N/A  Endocrine: N/A  Musculoskeletal: N/A  Allergy/Immunology: N/A  Neurological: N/A  Psychiatric: N/A  The following represents the patient's updated allergies and side effects list: Allergies  Allergen Reactions  . Donepezil Hydrochloride     REACTION: heart fluttering  . Morphine Sulfate   . Oxycodone-Acetaminophen     REACTION: Hallucinations    Labs since last visit of relevance include the following: Results for orders placed in visit on 04/17/14  PROTIME-INR      Result Value Ref Range   INR 11.4 (*) 0.9 - 1.1    The neurologically relevant items on the patient's problem list were reviewed on today's visit.  Neurologic Examination  A problem focused neurological exam (12 or more points of the single system neurologic examination, vital signs counts as 1 point, cranial nerves count for 8 points) was performed.  Blood pressure 147/61, pulse 77, height 0' (0 m), weight 0 lb (0 kg).  General - Well nourished, well developed, in no apparent distress.  Ophthalmologic - not able to see through.  Cardiovascular - irregularly irregular heart rate and rhythm.  Mental Status -  Level of arousal and orientation to place, and person were intact, but not to time. Language including expression, naming, repetition, comprehension was assessed and found intact.  Cranial Nerves II - XII - II - Visual field intact OU, but with left visual neglect. III, IV, VI -  Extraocular movements intact. V - Facial sensation intact bilaterally. VII - left facial droop. VIII - Hearing & vestibular intact bilaterally. X - Palate elevates symmetrically. XI - Chin turning & shoulder shrug intact bilaterally. XII - Tongue protrusion intact.  Motor Strength - The patient's strength was left UE 2/5 proximal and 0/5 distal  and Left LE 3/5 distal and proximal. Left UE and LE mildly Spastic left.    Reflexes - The patient's reflexes were 1+ in all extremities and she had no pathological reflexes.  Sensory - Light touch, temperature/pinprick were assessed and were normal.    Coordination - The patient had normal movements in the right hand with no ataxia or dysmetria.  Tremor was absent.  Gait and Station - not able to test.  Data reviewed: I personally reviewed the images and agree with the radiology interpretations.  CT head 03/03/14 1. Acute or subacute extension of the right middle cerebral artery  distribution stroke in the right frontal lobe and basal ganglia, new  since the 01/07/2014 examination, without associated hemorrhage or  hematoma.  2. Minimal mass effect with effacement of cortical sulci in the  right frontal lobe and slight midline shift to the left  approximating 3 mm.  3. Otherwise, stable encephalomalacia involving the right temporal,  parietal and posterior frontal lobes related to the remote large MCA  distribution stroke.  4. Stable moderate to severe generalized atrophy and severe chronic  microvascular ischemic changes of the white matter. CT angio head 01/07/14  1. Stable non contrast CT appearance of the brain since 01/06/2014.  Chronic large right MCA infarct and small chronic left cerebellar  lacunar infarct.  2. CTA attempt was aborted following extravasation of IV contrast,  and no additional IV access could be obtained. CT head 01/07/14 1. No intracranial hemorrhage or acute infarct identified.  2. Remote right MCA territory infarct.  3. Left maxillary sinusitis. Carotid Doppler Bilateral - 1% to 39 % ICA stenosis. Vertebral artery flow is antegrade  2D Echocardiogram The estimated ejection fraction was 55%. Wall motion was normal; there were no regional wall motion abnormalities. Aortic valve: Heavy calcification with very severe aortic stenosis. CXR COPD and chronic  scarring in the peripheral right upper lung. No  definite acute cardiopulmonary disease.  Cardiomegaly and thoracic aorta atherosclerosis.  EKG Atrial fibrillation with occasional ventricular-paced complexes and with premature ventricular or aberrantly conducted complexes. ST & T wave abnormality, consider inferolateral ischemia  LDL 74 and A1C 5.5   Assessment: As you may recall, she is a 80 y.o. Caucasian female with PMH of Right MCA CVA with left-sided residual weakness, Afib on apixaban, Dementia, severe aortic stenosis, CAD, COPD, tachy-brady syndrome s/p Pacemaker, h/o breast CA, resident of SNF was admitted to [facility] on 03/03/14 for acute worsening left-sided weakness. She also was admitted in June for her decreased responsiveness and left-sided weakness and CT at that time showed remote right MCA stroke large stroke. CT in the recent admission showed extension of right MCA stroke. She has been on Eliquis up in the admission in June, Eliquis was changed back to Coumadin on admission in July  Do to very severe aortic stenosis. Currently, she is in SNF for Coumadin therapy with INR checking. However her last INR on 04/07/14 was only 1.14. Will check INR today and that asked her to followup with PCP for Coumadin adjustment. She needs continued PT OT for left-sided muscle strengthening.  Plan:  - Check INR today, goal is to 0.5-3.5 as per  nursing staff in SNF due to severe aortic stenosis. - Followup with her PCP for Coumadin dose adjustment - Continue pravastatin for stroke prevention - Aggressive PT OT for left arm and leg weakness - RTC in 2 months  Orders Placed This Encounter  Procedures  . Protime-INR  . CMP    There are no Patient Instructions on file for this visit.  Rosalin Hawking, MD PhD [facility] [address] [place], [place] [address] [phone]

## 2014-04-18 ENCOUNTER — Telehealth: Payer: Self-pay

## 2014-04-18 LAB — COMPREHENSIVE METABOLIC PANEL
ALT: 10 IU/L (ref 0–32)
AST: 30 IU/L (ref 0–40)
Albumin/Globulin Ratio: 0.9 — ABNORMAL LOW (ref 1.1–2.5)
Albumin: 4 g/dL (ref 3.5–4.7)
Alkaline Phosphatase: 76 IU/L (ref 39–117)
BUN/Creatinine Ratio: 24 (ref 11–26)
BUN: 30 mg/dL — ABNORMAL HIGH (ref 8–27)
CALCIUM: 10 mg/dL (ref 8.7–10.3)
CHLORIDE: 97 mmol/L (ref 97–108)
CO2: 18 mmol/L (ref 18–29)
Creatinine, Ser: 1.24 mg/dL — ABNORMAL HIGH (ref 0.57–1.00)
GFR calc Af Amer: 47 mL/min/{1.73_m2} — ABNORMAL LOW (ref >59)
GFR calc non Af Amer: 41 mL/min/{1.73_m2} — ABNORMAL LOW (ref >59)
GLUCOSE: 96 mg/dL (ref 65–99)
Globulin, Total: 4.4 g/dL (ref 1.5–4.5)
POTASSIUM: 4.2 mmol/L (ref 3.5–5.2)
Sodium: 138 mmol/L (ref 134–144)
TOTAL PROTEIN: 8.4 g/dL (ref 6.0–8.5)
Total Bilirubin: 0.6 mg/dL (ref 0.0–1.2)

## 2014-04-18 LAB — PROTIME-INR
INR: 1.2 (ref 0.8–1.2)
PROTHROMBIN TIME: 12.2 s — AB (ref 9.1–12.0)

## 2014-04-18 NOTE — Progress Notes (Signed)
So INR now suppressed need to know what home is currently dosing so I can adjust

## 2014-04-18 NOTE — Telephone Encounter (Signed)
Per md we need to contact [facility] and get pts coumadin dosing/schedule:  Left a message with front staff to fax [place] coumadin dosing/schedule

## 2014-04-21 ENCOUNTER — Telehealth: Payer: Self-pay | Admitting: Family Medicine

## 2014-04-21 ENCOUNTER — Other Ambulatory Visit: Payer: Self-pay

## 2014-04-21 MED ORDER — BUDESONIDE 1 MG/2ML IN SUSP: 1.0000 mg | Freq: Two times a day (BID) | RESPIRATORY_TRACT | Status: AC

## 2014-04-21 MED ORDER — BUDESONIDE 180 MCG/ACT IN AEPB: 1.0000 | INHALATION_SPRAY | Freq: Two times a day (BID) | RESPIRATORY_TRACT | Status: DC

## 2014-04-21 NOTE — Telephone Encounter (Signed)
Pulmicort nebs 1mg /35ml 1 dose po bid via neb

## 2014-04-21 NOTE — Addendum Note (Signed)
Addended by: Varney Daily on: 04/21/2014 03:59 PM   Modules accepted: Orders

## 2014-04-21 NOTE — Telephone Encounter (Signed)
Caller name: [name] Relation to pt: daughter Call back number: [phone] Pharmacy:  Reason for call:   Patient daughter states that patient needs to stop taking the stool softener because patients diaper is "always full". [name] is requesting that we send an order to [facility] for this.

## 2014-04-21 NOTE — Telephone Encounter (Signed)
I spoke with [place] at [facility] and she states that pts Pulmicort is with the nebulizor.

## 2014-04-21 NOTE — Telephone Encounter (Signed)
RX sent to pharmacy  

## 2014-04-21 NOTE — Telephone Encounter (Signed)
OK to give order to d/c her stool softener.

## 2014-04-21 NOTE — Telephone Encounter (Signed)
Per MD: Check PT/INR weekly X 4 weeks then go to once a week X 4 weeks, then monthly X 11 months RESULTS NEED TO BE SENT TO DR BLYTH  Pt is to take 2.5 mg of coumadin daily except on Saturday take 5 mg  RX for inhaler attached

## 2014-04-22 NOTE — Telephone Encounter (Signed)
Faxed to [facility]

## 2014-04-24 ENCOUNTER — Telehealth: Payer: Self-pay

## 2014-04-24 NOTE — Telephone Encounter (Signed)
PA has already been started, case #[id number], [employer] faxed over request needing more info, forward paperwork to nurse

## 2014-04-24 NOTE — Telephone Encounter (Signed)
We received paperwork stating that [facility] needs more information for RX of pulmicort  Paperwork given to md

## 2014-04-24 NOTE — Telephone Encounter (Signed)
Can you get the prior authorization started for this

## 2014-04-24 NOTE — Telephone Encounter (Signed)
Caller name:  Mickie Hillier  Relation to pt: other / [employer]  Call back number: [phone]   Reason for call: prior-auth for budesonide (PULMICORT) 1 MG/2ML nebulizer solution

## 2014-04-29 ENCOUNTER — Telehealth: Payer: Self-pay | Admitting: *Deleted

## 2014-04-29 NOTE — Telephone Encounter (Deleted)
FYIMickel Duhamel Triage Call Report Triage Record Num: [id number] Operator: Arbutus Leas Patient Name: Rita Miller Call Date & Time: 04/27/2014 7:26:27PM Patient Phone: [phone] PCP: Dimple Nanas Patient Gender: Female PCP Fax : [phone] Patient DOB: 01/20/1984 Practice Name: [facility] Reason for Call: Caller: Zachary/Patient; PCP: Midge Minium.; CB#: [phone]; Call regarding EAR PAIN; Onset-1 month ago.Temp- Afebrile per patient. Patient states that he was diagnosed with an Ear infection today at [place] urgent care. He was given a rx for Amoxicillin and Prednisone. He c/o of ear pain that he rates at a 8 on the pain scale. He has tried OTC Ibuprofen. He states that there is some swelling near the lymph nodes around the ear. Emergent s/s of Ear symptoms protocol r/o. See provider within 4 hrs. Per protocol AB Otic ear drops instill 2-3 gtts to affected ear q2-3 hrs prn pain no refill called to [facility] [phone]. Protocol(s) Used: Ear: Symptoms Recommended Outcome per Protocol: See Provider within 4 hours Override Outcome if Used in Protocol: Call Dispensing Pharmacy or Provider Immediately RN Reason for Override Outcome: Rx Standing Orders Used. Reason for Outcome: Swelling, tenderness, OR redness of visible portion of outer ear Care Advice: A warm washcloth or heating pad set on low to the affected ear may help relieve the discomfort. May apply for 15 to 20 minutes, 3 to 4 times a day. ~ Analgesic/Antipyretic Advice - NSAIDs: Consider aspirin, ibuprofen, naproxen or ketoprofen for pain or fever as directed on label or by pharmacist/provider. PRECAUTIONS: - If over 24 years of age, should not take longer than 1 week without consulting provider. EXCEPTIONS: - Should not be used if taking blood thinners or have bleeding problems. - Do not use if have history of sensitivity/allergy to any of these medications; or history of  cardiovascular, ulcer, kidney, liver disease or diabetes unless approved by provider. - Do not exceed recommended dose or frequency. ~ 04/27/2014 8:12:13PM Page 1 of 1 [email or web address]

## 2014-04-29 NOTE — Telephone Encounter (Signed)
FYIMickel Duhamel Triage Call Report Triage Record Num: [id number] Operator: Amada Kingfisher Patient Name: Phillipa Morden Call Date & Time: 04/27/2014 11:16:24AM Patient Phone: [phone] PCP: Gwyneth Revels Patient Gender: Female PCP Fax : [phone] Patient DOB: 1930/05/18 Practice Name: [facility] Reason for Call: Caller: Moni/LPN; PCP: Penni Homans (Family Practice); CB#: [phone]; Call regarding Pressure Wound; found 04/27/14; approximately 1cm around; open, but not draining; says it was treated per standing facility protocol; just letting office know Protocol(s) Used: Office Note Recommended Outcome per Protocol: Information Noted and Sent to Office Reason for Outcome: Caller information to office Care Advice: ~ 04/27/2014 11:19:25AM Page 1 of 1 [email or web address]

## 2014-05-05 NOTE — Telephone Encounter (Signed)
Caller name: Rosanne Ashing Relation to pt: Chanda Busing  Call back number: [phone]   Reason for call:  Pt had INR scheduled for today, but after 2 sticks, they were unable to do.  They want to know if they can do it Thursday when lab comes back or give advice on if needed sooner.  Please call back.

## 2014-05-05 NOTE — Telephone Encounter (Signed)
Spoke with PCP and PCP's nurse.  Ok to wait for Thursday.  Please call nursing home and inform them.

## 2014-05-06 NOTE — Telephone Encounter (Signed)
Rosanne Ashing informed

## 2014-05-08 ENCOUNTER — Telehealth: Payer: Self-pay | Admitting: Family Medicine

## 2014-05-08 NOTE — Telephone Encounter (Signed)
Caller name:vanessa-[facility] Relation to XN:TZGY Call back FVCBSW:[phone] Pharmacy:  Reason for call: needing verification, pt has an order for rx flovent and pulmicort, need verification if both are needed. Requesting call back today if possible pt needs for in the morning.

## 2014-05-08 NOTE — Telephone Encounter (Signed)
She needs Pulmicort not Flovent

## 2014-05-09 ENCOUNTER — Telehealth: Payer: Self-pay

## 2014-05-09 NOTE — Telephone Encounter (Signed)
Increase Coumadin to 5 mg po 3 x a week and 2.5 mg the other days. Recheck INR in 1 week

## 2014-05-09 NOTE — Telephone Encounter (Signed)
Lorriane Shire informed and order faxed to [phone]

## 2014-05-09 NOTE — Telephone Encounter (Signed)
PT/INR was 1.35 05-09-14  2.5 mg of coumadin everyday except Saturday takes 5 mg   Please advise if this is ok and if pt is still on a monthly PT/INR check  Monia Pouch also stated they are sending an order for MD to sign off on o2 and to D/C Flovent.  I informed Monia Pouch that I spoke to someone today and sent this over and she stated it didn't say D/C. I informed Monia Pouch to send [place] what it needs to say and MD would sign off.   Monia Pouch voiced understanding

## 2014-05-12 NOTE — Telephone Encounter (Signed)
Rita Miller at [facility] was informed and she repeated information to me

## 2014-05-13 ENCOUNTER — Encounter: Payer: Self-pay | Admitting: *Deleted

## 2014-05-15 ENCOUNTER — Telehealth: Payer: Self-pay | Admitting: Family Medicine

## 2014-05-15 NOTE — Telephone Encounter (Signed)
Caller name: [name] Relation to pt: daughter Call back number: [phone] Pharmacy:  Reason for call:   Patient daughter states that the nursing home cleaned the floors and now patient daughter is requesting an order for eye drops to be sent to [facility]. Or, patient wants to know if she should go to the store and buy systane for patient?

## 2014-05-15 NOTE — Telephone Encounter (Signed)
Corrinne Eagle from [facility] called stating that an order is needed if eye drops are ordered. Lorriane Shire states that patient could be moved into another room but patient daughter says no. Please Advise. Linton Hall P: [phone]

## 2014-05-15 NOTE — Telephone Encounter (Signed)
HAVE THEM try visine allergy drops 3 drop b/l eyes bid x 7 days then as needed afer that

## 2014-05-16 NOTE — Telephone Encounter (Signed)
Order faxed.

## 2014-05-19 ENCOUNTER — Telehealth: Payer: Self-pay | Admitting: Family Medicine

## 2014-05-19 LAB — PROTIME-INR: INR: 4.1 — AB (ref 0.9–1.1)

## 2014-05-19 NOTE — Telephone Encounter (Signed)
Sidon in with pt's INR - 4.05  Pro Time 40.1  Derived Fibrogen - greater than 1000

## 2014-05-20 ENCOUNTER — Telehealth: Payer: Self-pay | Admitting: Family Medicine

## 2014-05-20 NOTE — Telephone Encounter (Signed)
I have not seen a copy of these lab results yet. Would like a copy. For now hold coumadin x 2 days then restart at 2.5 mg daily but just 6 days a week not 7 which is what I think she is taking now then recheck PT/INR next week

## 2014-05-20 NOTE — Telephone Encounter (Signed)
Call a Nurse notes: Reason for Call: Caller: [name]/LPN; PCP: Penni Homans [facility]); CB#: [phone]; Call regarding PT/INR; PT-40.1, INR- 4.05. Collected 05/19/14. Denies bleeding. Coumadin dose is 5mg  on M/W/F, 2.5mg  on T/TH/Sat/Sun. Triaged per "PCP Calls, No Triage (Adult)" guideline. Disposition is to Call Provider within 24 hours; caller answered yes to "Lab calling with test results." Per office standing orders, advised to hold Coumadin dose and contact office 05/20/14 for further orders.  Please advise

## 2014-05-20 NOTE — Telephone Encounter (Signed)
Caller name: Betite  Relation to pt: other / [employer] back number: [phone]   Reason for call;  As per office due to lab results would like to know if coumadin RX can be changed.

## 2014-05-20 NOTE — Telephone Encounter (Signed)
Caller name:morique-[facility] Relation to FS:ELTR Call back [phone]  Pharmacy:  Reason for call: needing to report pt's INR results 4.05 coumadin dose is 5 mg on mon, wed,and fri, and 2.5 ,mg on tues, thurs, sat, and sun. Need to know if you would like for her to keep as is, and when to draw again. States you can just ask for her nurse.

## 2014-05-20 NOTE — Telephone Encounter (Signed)
See previous note

## 2014-05-20 NOTE — Telephone Encounter (Signed)
Fax: [phone] Spoke with Data processing manager at [facility]. Advised per recommendations. Needs new order; written and faxed.

## 2014-05-20 NOTE — Telephone Encounter (Signed)
So they let us know her Coumadin was different than I thought. She actually needs to restart in 2 days at 2.5 mg po daily x 6 days a week and 5 mg po one day a week

## 2014-05-21 NOTE — Telephone Encounter (Signed)
Attempted to reach [facility] and call was unanswered, no voicemail x 2.

## 2014-05-23 NOTE — Telephone Encounter (Signed)
Faxed this to [facility]

## 2014-05-27 NOTE — Telephone Encounter (Signed)
Rita Miller with [facility] facility called with new results. PT-13.1   INR--1.33 Currently taking 2.5mg  all days with exception of 1 day take 5mg . Please advise. Call back# [phone] Fax# [phone]

## 2014-05-28 ENCOUNTER — Telehealth: Payer: Self-pay | Admitting: Family Medicine

## 2014-05-28 ENCOUNTER — Encounter: Payer: Self-pay | Admitting: Cardiology

## 2014-05-28 ENCOUNTER — Ambulatory Visit (INDEPENDENT_AMBULATORY_CARE_PROVIDER_SITE_OTHER): Payer: Medicare Other | Admitting: Cardiology

## 2014-05-28 VITALS — BP 132/50 | HR 85 | Ht 59.0 in

## 2014-05-28 DIAGNOSIS — I482 Chronic atrial fibrillation, unspecified: Secondary | ICD-10-CM

## 2014-05-28 DIAGNOSIS — Z9889 Other specified postprocedural states: Secondary | ICD-10-CM

## 2014-05-28 DIAGNOSIS — I350 Nonrheumatic aortic (valve) stenosis: Secondary | ICD-10-CM

## 2014-05-28 DIAGNOSIS — Z950 Presence of cardiac pacemaker: Secondary | ICD-10-CM

## 2014-05-28 DIAGNOSIS — I2510 Atherosclerotic heart disease of native coronary artery without angina pectoris: Secondary | ICD-10-CM

## 2014-05-28 NOTE — Assessment & Plan Note (Signed)
Continue SBE prophylaxis. 

## 2014-05-28 NOTE — Telephone Encounter (Signed)
Caller name:Kathy From [facility] Relation to pt: Nurse Call back number: fax num. [phone] Pharmacy:  Reason for call: Juliann Pulse sent a fax of video test of pt. Nurse requesting for pt to have a PTINR drawing (bloodwork). Needs form to be signed. Please advise.

## 2014-05-28 NOTE — Patient Instructions (Signed)
Your physician wants you to follow-up in: 6 MONTHS WITH DR CRENSHAW You will receive a reminder letter in the mail two months in advance. If you don't receive a letter, please call our office to schedule the follow-up appointment.  

## 2014-05-28 NOTE — Assessment & Plan Note (Signed)
Continue statin. Not on aspirin given need for Coumadin. 

## 2014-05-28 NOTE — Telephone Encounter (Signed)
Increase Coumadin up again to 2.5 mg daily except 2 days a week take 5 mg daily on Tues and Saturday. Recheck PT/INR in 1 week.

## 2014-05-28 NOTE — Progress Notes (Signed)
HPI: FU atrial fibrillation, prior pacemaker, coronary artery disease, aortic stenosis, mitral regurgitation s/p MV repair (apparently felt not to be a candidate for AVR at the time). Last Myoview was performed in May of 2010 and showed normal perfusion and an ejection fraction of 53%. Patient also with severe PVD followed by vasc surgery. Abdominal CT in June of 2015 showed a 4.1 x 3.7 cm abdominal aortic aneurysm. Common bile duct dilated raising concern for mass. Patient apparently admitted in June of 2015 with altered mental status. Possible new stroke. Head CT June 2015 showed chronic large right MCA infarct and small chronic left cerebellar lacunar infarct. Echocardiogram in July of 2015 showed normal LV function. There was severe aortic stenosis with a mean gradient of 64 mm mercury. There was mild aortic insufficiency. Biatrial enlargement. Patient has failure to thrive and dementia. Patient resides in a nursing home. She was again admitted in July with extension of prior CVA. At present she denies dyspnea, chest pain, palpitations or syncope. She does not ambulate.   Current Outpatient Prescriptions  Medication Sig Dispense Refill  . acetaminophen (TYLENOL) 650 MG CR tablet Take 650 mg by mouth as directed. TAKE BY MOUTH EVERY 4 HOURS PRN ---- FEVER      . albuterol (PROVENTIL HFA;VENTOLIN HFA) 108 (90 BASE) MCG/ACT inhaler Inhale 2 puffs into the lungs every 6 (six) hours as needed for wheezing or shortness of breath.  1 Inhaler  3  . albuterol (PROVENTIL) (2.5 MG/3ML) 0.083% nebulizer solution Take 2.5 mg by nebulization every 4 (four) hours as needed for wheezing or shortness of breath.      . budesonide (PULMICORT) 1 MG/2ML nebulizer solution Take 2 mLs (1 mg total) by nebulization 2 (two) times daily.  60 mL  1  . budesonide (PULMICORT) 180 MCG/ACT inhaler Inhale 1 puff into the lungs 2 (two) times daily.      . chlorhexidine (PERIDEX) 0.12 % solution 15 mLs by Mouth Rinse route 2  (two) times daily.  120 mL  0  . guaifenesin (ROBITUSSIN) 100 MG/5ML syrup Take 100 mg by mouth every 4 (four) hours as needed for cough.      . LEE SALINE NASAL SPRAY NA Place into the nose. 0.65%--- USE 1 (ONE) SPRAY EACH NOSTRIL TWICE DAILY      . levothyroxine (SYNTHROID, LEVOTHROID) 75 MCG tablet Take 75 mcg by mouth daily before breakfast.      . Melatonin 5 MG TABS Take 5 mg by mouth at bedtime.      . memantine (NAMENDA) 10 MG tablet Take 10 mg by mouth 2 (two) times daily.       . Multiple Vitamin (MULTIVITAMIN) tablet Take 1 tablet by mouth daily. WITH MINERALS---PO QD--- WOUND      . NON FORMULARY PROSTAT 30 ML PO QD      . NON FORMULARY OXYGEN      . pravastatin (PRAVACHOL) 20 MG tablet Take 20 mg by mouth daily.      . ranitidine (ZANTAC) 300 MG tablet Take 300 mg by mouth at bedtime.      . Rivastigmine (EXELON) 13.3 MG/24HR PT24 Place 1 patch onto the skin daily.      . vitamin C (ASCORBIC ACID) 500 MG tablet Take 500 mg by mouth 2 (two) times daily. X 14 DAYS      . warfarin (COUMADIN) 2.5 MG tablet Take 2.5 mg by mouth as directed. Daily X 6 days a week and 5 mg  po one day a week      . zinc sulfate 220 MG capsule Take 220 mg by mouth daily. X 14 DAYS "WOUND"       No current facility-administered medications for this visit.     Past Medical History  Diagnosis Date  . CAD (coronary artery disease)   . History of CVA (cerebrovascular accident)   . Atrial fibrillation     peristent  . Hyperlipemia   . COPD (chronic obstructive pulmonary disease)   . Epistaxis     recurrent  . Osteoporosis   . Hypothyroidism   . History of pulmonary embolism   . History of colonic diverticulitis   . History of breast cancer   . Asthma   . Iron deficiency anemia   . History of transfusion of whole blood   . LBBB (left bundle branch block)     history of  . Aortal stenosis     severe  . Pacemaker     bradycardia s/p pacemaker implant  . PAD (peripheral artery disease)     left  leg occluded viabahn stents x 3 left SFA  . Tachycardia-bradycardia syndrome     s/p PPM  . Cancer   . Arthritis   . Stroke   . HTN (hypertension) 09/25/2012  . HTN (hypertension) 09/25/2012  . Esophageal reflux 01/26/2014  . Left-sided visual neglect 03/03/2014    Past Surgical History  Procedure Laterality Date  . Thyroidectomy      due to cancer  . Total hip arthroplasty    . Abdominal hysterectomy    . Mitral valve repair    . Intraocular lens insertion      left eye implant  . Pacemaker placement      for tachy/brady syndrome  . Mastectomy      History   Social History  . Marital Status: Widowed    Spouse Name: N/A    Number of Children: 1  . Years of Education: 12th   Occupational History  .     Social History Main Topics  . Smoking status: Former Smoker    Quit date: 02/28/1994  . Smokeless tobacco: Never Used     Comment: Quit >20 years  . Alcohol Use: No  . Drug Use: No  . Sexual Activity: Not on file   Other Topics Concern  . Not on file   Social History Narrative   Retired   Widow    1 daughter             ROS: no fevers or chills, productive cough, hemoptysis, dysphasia, odynophagia, melena, hematochezia, dysuria, hematuria, rash, seizure activity, orthopnea, PND, pedal edema, claudication. Remaining systems are negative.  Physical Exam: Well-developed frail in no acute distress.  Skin is warm and dry.  HEENT is normal.  Neck is supple.  Chest is clear to auscultation with normal expansion.  Cardiovascular exam is irregular. 3/6 systolic murmur LSB; S2 diminished. Abdominal exam nontender or distended. No masses palpated. Extremities show no edema. Neuro dementia; right side weakness  ECG 03/03/2014-atrial fibrillation with intermittent ventricular pacing, inferior lateral T-wave inversion, cannot rule out prior septal infarct.

## 2014-05-28 NOTE — Assessment & Plan Note (Signed)
Continue statin. 

## 2014-05-28 NOTE — Assessment & Plan Note (Signed)
Patient has severe aortic stenosis. She has multiple medical problems including recent CVA, dementia and frail body habitus. She is not a candidate for any further cardiac interventions. She is a no CODE BLUE. She would benefit from palliative care consult. I will leave this to primary care.

## 2014-05-28 NOTE — Assessment & Plan Note (Signed)
Management per electrophysiology. 

## 2014-05-28 NOTE — Assessment & Plan Note (Signed)
Patient remains in atrial fibrillation on examination. She is on Coumadin which is managed by her nursing home. Rate is controlled on no medications.

## 2014-05-28 NOTE — Telephone Encounter (Signed)
Forms forwarded to Dr. Charlett Blake. JG//CMA

## 2014-05-30 ENCOUNTER — Telehealth: Payer: Self-pay

## 2014-05-30 NOTE — Telephone Encounter (Signed)
NO I have seen paper work on this twice this week and given orders to increase her Coumadin to 5 mg twice a week on Tues and Saturday and to stay at 2.5 mg daily the other days of the week and recheck PT/INR next week.

## 2014-05-30 NOTE — Telephone Encounter (Signed)
[facility] called with pts PT/INR results:  Pt 13.1 Inr 1.33  Coumadin 5 mg on Friday and 2.5 days of the week

## 2014-05-30 NOTE — Telephone Encounter (Signed)
Forms faxed back to [facility]. JG//CMA

## 2014-06-02 NOTE — Telephone Encounter (Signed)
Noted under separate cover

## 2014-06-02 NOTE — Telephone Encounter (Signed)
Faxed this to [facility]

## 2014-06-05 ENCOUNTER — Telehealth: Payer: Self-pay | Admitting: Family Medicine

## 2014-06-05 NOTE — Telephone Encounter (Signed)
Caller name: Andrey Cota RN Relation to pt: other  Call back number: [phone]   Reason for call: Eleve the RN called wanted to advise pt fell and will give further details when call

## 2014-06-05 NOTE — Telephone Encounter (Signed)
Called and left a message for call back  

## 2014-06-06 ENCOUNTER — Telehealth: Payer: Self-pay | Admitting: Family Medicine

## 2014-06-06 NOTE — Telephone Encounter (Signed)
A user error has taken place.

## 2014-06-06 NOTE — Telephone Encounter (Signed)
Orders faxed to [facility]

## 2014-06-06 NOTE — Telephone Encounter (Signed)
Increase Coumadin to 5 mg 3 x a week Tues, Thurs, Sat and stay at 2.5 mg every other day and recheck INR in 1 week

## 2014-06-06 NOTE — Telephone Encounter (Signed)
Caller name:  Nelida Gores From: [facility] Busing Call back number: [phone] Fax number:  [phone]  06/05/14 PT: 15.4 INR: 1.59 Derived fibrinogen: 620  Coumadin 5 mg on Tuesday and Saturday, 2.5 mg all other days.    Results and current Coumadin dose read back for accuracy.  Juliann Pulse asked that new orders be faxed back to facility.    Also spoke to [place] regarding patient's recent fall (06/05/14), Juliann Pulse states that patient slide out of wheelchair.  No injuries were sustained.  Nurse called yesterday to make [place] aware.    Please advise

## 2014-06-06 NOTE — Telephone Encounter (Signed)
Please advise 

## 2014-06-06 NOTE — Telephone Encounter (Signed)
Called and left a message for call back  

## 2014-06-06 NOTE — Telephone Encounter (Signed)
Caller name: Debbie Relation to pt: Rita Miller Call back number: [phone]    Reason for call:  Wants to speak about lab results

## 2014-06-11 ENCOUNTER — Telehealth: Payer: Self-pay | Admitting: Family Medicine

## 2014-06-11 NOTE — Telephone Encounter (Signed)
Please see telephone note on 06/06/14.

## 2014-06-11 NOTE — Telephone Encounter (Signed)
Caller name: kathy--[facility] Relation to pt: Call back number: [phone] Pharmacy:  Reason for call:   Juliann Pulse states that they thought patient was aspirating but come to find out patient has GERD. They need a new medication for this. Med needs to be crushed.

## 2014-06-11 NOTE — Telephone Encounter (Signed)
Spoke with Helene Kelp and she says that a Speech therapist came to see patient and determined that patient has GERD and is not aspirating.  She has Zantac on her medication list, however, Helene Kelp states that she's unsure of whether or not Zantac is on her MAR.  She was unable to check MAR, due to chart being in a room in which they were counting narcotics.    Please advise.

## 2014-06-11 NOTE — Telephone Encounter (Signed)
Have them start her on Omeprazole 20 mg daily regardless of whether or not she is on the Zantac or not but clarify whether they have been giving it to her.

## 2014-06-12 NOTE — Telephone Encounter (Signed)
I informed Rita Miller at [facility] and she states pt takes Zantac 300 mg every night according to patients chart.  Pt stated she didn't need an RX for omeprazole 20 mg and would have pt start taking it every am

## 2014-06-13 ENCOUNTER — Encounter (HOSPITAL_COMMUNITY): Payer: Self-pay

## 2014-06-13 ENCOUNTER — Emergency Department (HOSPITAL_COMMUNITY): Payer: Medicare Other

## 2014-06-13 ENCOUNTER — Inpatient Hospital Stay (HOSPITAL_COMMUNITY)
Admission: EM | Admit: 2014-06-13 | Discharge: 2014-07-08 | DRG: 871 | Disposition: E | Payer: Medicare Other | Attending: Internal Medicine | Admitting: Internal Medicine

## 2014-06-13 DIAGNOSIS — F419 Anxiety disorder, unspecified: Secondary | ICD-10-CM | POA: Diagnosis not present

## 2014-06-13 DIAGNOSIS — Z950 Presence of cardiac pacemaker: Secondary | ICD-10-CM | POA: Diagnosis present

## 2014-06-13 DIAGNOSIS — R6521 Severe sepsis with septic shock: Secondary | ICD-10-CM | POA: Diagnosis present

## 2014-06-13 DIAGNOSIS — R0600 Dyspnea, unspecified: Secondary | ICD-10-CM

## 2014-06-13 DIAGNOSIS — Z8673 Personal history of transient ischemic attack (TIA), and cerebral infarction without residual deficits: Secondary | ICD-10-CM

## 2014-06-13 DIAGNOSIS — N136 Pyonephrosis: Secondary | ICD-10-CM | POA: Diagnosis present

## 2014-06-13 DIAGNOSIS — J449 Chronic obstructive pulmonary disease, unspecified: Secondary | ICD-10-CM | POA: Diagnosis present

## 2014-06-13 DIAGNOSIS — R0602 Shortness of breath: Secondary | ICD-10-CM

## 2014-06-13 DIAGNOSIS — Y95 Nosocomial condition: Secondary | ICD-10-CM | POA: Diagnosis present

## 2014-06-13 DIAGNOSIS — R509 Fever, unspecified: Secondary | ICD-10-CM

## 2014-06-13 DIAGNOSIS — R54 Age-related physical debility: Secondary | ICD-10-CM | POA: Diagnosis present

## 2014-06-13 DIAGNOSIS — I350 Nonrheumatic aortic (valve) stenosis: Secondary | ICD-10-CM | POA: Diagnosis present

## 2014-06-13 DIAGNOSIS — E039 Hypothyroidism, unspecified: Secondary | ICD-10-CM | POA: Diagnosis present

## 2014-06-13 DIAGNOSIS — I10 Essential (primary) hypertension: Secondary | ICD-10-CM | POA: Diagnosis present

## 2014-06-13 DIAGNOSIS — I2510 Atherosclerotic heart disease of native coronary artery without angina pectoris: Secondary | ICD-10-CM | POA: Diagnosis present

## 2014-06-13 DIAGNOSIS — J440 Chronic obstructive pulmonary disease with acute lower respiratory infection: Secondary | ICD-10-CM | POA: Diagnosis present

## 2014-06-13 DIAGNOSIS — Z9889 Other specified postprocedural states: Secondary | ICD-10-CM

## 2014-06-13 DIAGNOSIS — G309 Alzheimer's disease, unspecified: Secondary | ICD-10-CM | POA: Diagnosis present

## 2014-06-13 DIAGNOSIS — M810 Age-related osteoporosis without current pathological fracture: Secondary | ICD-10-CM | POA: Diagnosis present

## 2014-06-13 DIAGNOSIS — J189 Pneumonia, unspecified organism: Secondary | ICD-10-CM | POA: Diagnosis present

## 2014-06-13 DIAGNOSIS — F0390 Unspecified dementia without behavioral disturbance: Secondary | ICD-10-CM | POA: Diagnosis present

## 2014-06-13 DIAGNOSIS — L89159 Pressure ulcer of sacral region, unspecified stage: Secondary | ICD-10-CM | POA: Diagnosis present

## 2014-06-13 DIAGNOSIS — E43 Unspecified severe protein-calorie malnutrition: Secondary | ICD-10-CM | POA: Diagnosis present

## 2014-06-13 DIAGNOSIS — I481 Persistent atrial fibrillation: Secondary | ICD-10-CM | POA: Diagnosis present

## 2014-06-13 DIAGNOSIS — R627 Adult failure to thrive: Secondary | ICD-10-CM | POA: Diagnosis present

## 2014-06-13 DIAGNOSIS — I4891 Unspecified atrial fibrillation: Secondary | ICD-10-CM

## 2014-06-13 DIAGNOSIS — N390 Urinary tract infection, site not specified: Secondary | ICD-10-CM

## 2014-06-13 DIAGNOSIS — N179 Acute kidney failure, unspecified: Secondary | ICD-10-CM

## 2014-06-13 DIAGNOSIS — N1330 Unspecified hydronephrosis: Secondary | ICD-10-CM | POA: Diagnosis present

## 2014-06-13 DIAGNOSIS — R680 Hypothermia, not associated with low environmental temperature: Secondary | ICD-10-CM | POA: Diagnosis present

## 2014-06-13 DIAGNOSIS — Q6239 Other obstructive defects of renal pelvis and ureter: Secondary | ICD-10-CM

## 2014-06-13 DIAGNOSIS — Z8249 Family history of ischemic heart disease and other diseases of the circulatory system: Secondary | ICD-10-CM

## 2014-06-13 DIAGNOSIS — Z66 Do not resuscitate: Secondary | ICD-10-CM | POA: Diagnosis present

## 2014-06-13 DIAGNOSIS — I739 Peripheral vascular disease, unspecified: Secondary | ICD-10-CM | POA: Diagnosis present

## 2014-06-13 DIAGNOSIS — A414 Sepsis due to anaerobes: Secondary | ICD-10-CM | POA: Diagnosis present

## 2014-06-13 DIAGNOSIS — D509 Iron deficiency anemia, unspecified: Secondary | ICD-10-CM | POA: Diagnosis present

## 2014-06-13 DIAGNOSIS — L89152 Pressure ulcer of sacral region, stage 2: Secondary | ICD-10-CM | POA: Diagnosis present

## 2014-06-13 DIAGNOSIS — F0280 Dementia in other diseases classified elsewhere without behavioral disturbance: Secondary | ICD-10-CM | POA: Diagnosis present

## 2014-06-13 DIAGNOSIS — J45909 Unspecified asthma, uncomplicated: Secondary | ICD-10-CM | POA: Diagnosis present

## 2014-06-13 DIAGNOSIS — K5900 Constipation, unspecified: Secondary | ICD-10-CM | POA: Diagnosis not present

## 2014-06-13 DIAGNOSIS — A419 Sepsis, unspecified organism: Secondary | ICD-10-CM

## 2014-06-13 DIAGNOSIS — K6819 Other retroperitoneal abscess: Secondary | ICD-10-CM | POA: Diagnosis present

## 2014-06-13 DIAGNOSIS — A4189 Other specified sepsis: Secondary | ICD-10-CM | POA: Diagnosis not present

## 2014-06-13 DIAGNOSIS — E860 Dehydration: Secondary | ICD-10-CM | POA: Diagnosis present

## 2014-06-13 DIAGNOSIS — Z681 Body mass index (BMI) 19 or less, adult: Secondary | ICD-10-CM

## 2014-06-13 DIAGNOSIS — Z86711 Personal history of pulmonary embolism: Secondary | ICD-10-CM

## 2014-06-13 DIAGNOSIS — Z9010 Acquired absence of unspecified breast and nipple: Secondary | ICD-10-CM | POA: Diagnosis present

## 2014-06-13 DIAGNOSIS — I5033 Acute on chronic diastolic (congestive) heart failure: Secondary | ICD-10-CM | POA: Diagnosis present

## 2014-06-13 DIAGNOSIS — R0689 Other abnormalities of breathing: Secondary | ICD-10-CM

## 2014-06-13 DIAGNOSIS — Z7901 Long term (current) use of anticoagulants: Secondary | ICD-10-CM

## 2014-06-13 DIAGNOSIS — Z515 Encounter for palliative care: Secondary | ICD-10-CM

## 2014-06-13 DIAGNOSIS — IMO0001 Reserved for inherently not codable concepts without codable children: Secondary | ICD-10-CM | POA: Insufficient documentation

## 2014-06-13 DIAGNOSIS — E874 Mixed disorder of acid-base balance: Secondary | ICD-10-CM | POA: Diagnosis present

## 2014-06-13 DIAGNOSIS — Z952 Presence of prosthetic heart valve: Secondary | ICD-10-CM

## 2014-06-13 DIAGNOSIS — J9601 Acute respiratory failure with hypoxia: Secondary | ICD-10-CM | POA: Diagnosis present

## 2014-06-13 DIAGNOSIS — R7881 Bacteremia: Secondary | ICD-10-CM | POA: Insufficient documentation

## 2014-06-13 DIAGNOSIS — R64 Cachexia: Secondary | ICD-10-CM | POA: Diagnosis present

## 2014-06-13 DIAGNOSIS — Z853 Personal history of malignant neoplasm of breast: Secondary | ICD-10-CM

## 2014-06-13 DIAGNOSIS — Z803 Family history of malignant neoplasm of breast: Secondary | ICD-10-CM

## 2014-06-13 DIAGNOSIS — E785 Hyperlipidemia, unspecified: Secondary | ICD-10-CM | POA: Diagnosis present

## 2014-06-13 DIAGNOSIS — R109 Unspecified abdominal pain: Secondary | ICD-10-CM | POA: Insufficient documentation

## 2014-06-13 DIAGNOSIS — R791 Abnormal coagulation profile: Secondary | ICD-10-CM

## 2014-06-13 DIAGNOSIS — I714 Abdominal aortic aneurysm, without rupture: Secondary | ICD-10-CM | POA: Diagnosis present

## 2014-06-13 DIAGNOSIS — B966 Bacteroides fragilis [B. fragilis] as the cause of diseases classified elsewhere: Secondary | ICD-10-CM | POA: Diagnosis present

## 2014-06-13 DIAGNOSIS — Z87891 Personal history of nicotine dependence: Secondary | ICD-10-CM

## 2014-06-13 DIAGNOSIS — A498 Other bacterial infections of unspecified site: Secondary | ICD-10-CM | POA: Insufficient documentation

## 2014-06-13 DIAGNOSIS — K219 Gastro-esophageal reflux disease without esophagitis: Secondary | ICD-10-CM | POA: Diagnosis present

## 2014-06-13 DIAGNOSIS — N151 Renal and perinephric abscess: Secondary | ICD-10-CM

## 2014-06-13 DIAGNOSIS — Z833 Family history of diabetes mellitus: Secondary | ICD-10-CM

## 2014-06-13 LAB — CBC WITH DIFFERENTIAL/PLATELET
BASOS ABS: 0 10*3/uL (ref 0.0–0.1)
Basophils Relative: 0 % (ref 0–1)
EOS ABS: 0 10*3/uL (ref 0.0–0.7)
EOS PCT: 0 % (ref 0–5)
HCT: 29.6 % — ABNORMAL LOW (ref 36.0–46.0)
Hemoglobin: 9.8 g/dL — ABNORMAL LOW (ref 12.0–15.0)
Lymphocytes Relative: 5 % — ABNORMAL LOW (ref 12–46)
Lymphs Abs: 0.8 10*3/uL (ref 0.7–4.0)
MCH: 30.3 pg (ref 26.0–34.0)
MCHC: 33.1 g/dL (ref 30.0–36.0)
MCV: 91.6 fL (ref 78.0–100.0)
Monocytes Absolute: 0.7 10*3/uL (ref 0.1–1.0)
Monocytes Relative: 4 % (ref 3–12)
Neutro Abs: 16.8 10*3/uL — ABNORMAL HIGH (ref 1.7–7.7)
Neutrophils Relative %: 91 % — ABNORMAL HIGH (ref 43–77)
Platelets: 267 10*3/uL (ref 150–400)
RBC: 3.23 MIL/uL — ABNORMAL LOW (ref 3.87–5.11)
RDW: 15.9 % — AB (ref 11.5–15.5)
WBC: 18.4 10*3/uL — AB (ref 4.0–10.5)

## 2014-06-13 LAB — URINALYSIS, ROUTINE W REFLEX MICROSCOPIC
Bilirubin Urine: NEGATIVE
Glucose, UA: NEGATIVE mg/dL
KETONES UR: NEGATIVE mg/dL
NITRITE: NEGATIVE
Protein, ur: 100 mg/dL — AB
SPECIFIC GRAVITY, URINE: 1.015 (ref 1.005–1.030)
Urobilinogen, UA: 1 mg/dL (ref 0.0–1.0)
pH: 6.5 (ref 5.0–8.0)

## 2014-06-13 LAB — COMPREHENSIVE METABOLIC PANEL
ALBUMIN: 2.1 g/dL — AB (ref 3.5–5.2)
ALT: 18 U/L (ref 0–35)
AST: 56 U/L — AB (ref 0–37)
Alkaline Phosphatase: 113 U/L (ref 39–117)
Anion gap: 20 — ABNORMAL HIGH (ref 5–15)
BUN: 88 mg/dL — ABNORMAL HIGH (ref 6–23)
CALCIUM: 8.5 mg/dL (ref 8.4–10.5)
CO2: 15 mEq/L — ABNORMAL LOW (ref 19–32)
Chloride: 97 mEq/L (ref 96–112)
Creatinine, Ser: 2 mg/dL — ABNORMAL HIGH (ref 0.50–1.10)
GFR calc Af Amer: 26 mL/min — ABNORMAL LOW (ref >90)
GFR calc non Af Amer: 22 mL/min — ABNORMAL LOW (ref >90)
Glucose, Bld: 206 mg/dL — ABNORMAL HIGH (ref 70–99)
Potassium: 4.2 mEq/L (ref 3.7–5.3)
SODIUM: 132 meq/L — AB (ref 137–147)
Total Bilirubin: 0.6 mg/dL (ref 0.3–1.2)
Total Protein: 7 g/dL (ref 6.0–8.3)

## 2014-06-13 LAB — I-STAT ARTERIAL BLOOD GAS, ED
ACID-BASE DEFICIT: 4 mmol/L — AB (ref 0.0–2.0)
BICARBONATE: 17.5 meq/L — AB (ref 20.0–24.0)
O2 Saturation: 89 %
PCO2 ART: 22.5 mmHg — AB (ref 35.0–45.0)
PO2 ART: 50 mmHg — AB (ref 80.0–100.0)
Patient temperature: 98.6
TCO2: 18 mmol/L (ref 0–100)
pH, Arterial: 7.499 — ABNORMAL HIGH (ref 7.350–7.450)

## 2014-06-13 LAB — URINE MICROSCOPIC-ADD ON

## 2014-06-13 LAB — TROPONIN I

## 2014-06-13 LAB — I-STAT CG4 LACTIC ACID, ED: Lactic Acid, Venous: 3.54 mmol/L — ABNORMAL HIGH (ref 0.5–2.2)

## 2014-06-13 LAB — LIPASE, BLOOD: LIPASE: 20 U/L (ref 11–59)

## 2014-06-13 MED ORDER — VANCOMYCIN HCL 500 MG IV SOLR: 500.0000 mg | INTRAVENOUS | Status: DC

## 2014-06-13 MED ORDER — DEXTROSE 5 % IV SOLN
2.0000 g | Freq: Once | INTRAVENOUS | Status: AC
  Administered 2014-06-13: 2 g via INTRAVENOUS
  Filled 2014-06-13: qty 2

## 2014-06-13 MED ORDER — TRAMADOL HCL 50 MG PO TABS
50.0000 mg | ORAL_TABLET | Freq: Once | ORAL | Status: AC
  Administered 2014-06-13: 50 mg via ORAL
  Filled 2014-06-13: qty 1

## 2014-06-13 MED ORDER — IOHEXOL 300 MG/ML  SOLN: 25.0000 mL | INTRAMUSCULAR | Status: AC

## 2014-06-13 MED ORDER — DEXTROSE 5 % IV SOLN
1.0000 g | INTRAVENOUS | Status: DC
  Filled 2014-06-13: qty 1

## 2014-06-13 MED ORDER — SODIUM CHLORIDE 0.9 % IV BOLUS (SEPSIS)
1000.0000 mL | INTRAVENOUS | Status: AC
  Administered 2014-06-13 (×2): 1000 mL via INTRAVENOUS

## 2014-06-13 MED ORDER — VANCOMYCIN HCL IN DEXTROSE 1-5 GM/200ML-% IV SOLN
1000.0000 mg | Freq: Once | INTRAVENOUS | Status: AC
Start: 2014-06-13 — End: 2014-06-14
  Administered 2014-06-13: 1000 mg via INTRAVENOUS
  Filled 2014-06-13: qty 200

## 2014-06-13 NOTE — ED Notes (Signed)
Pt comes from [facility] via [facility] c/o SOB and fever. Pt in Afib has hx of it. Received neb via EMS .

## 2014-06-13 NOTE — ED Provider Notes (Addendum)
CSN: [id number]     Arrival date & time 07/04/2014  2029 History   First MD Initiated Contact with Patient 06/09/2014 2032     Chief Complaint  Patient presents with  . Shortness of Breath  . Fever     (Consider location/radiation/quality/duration/timing/severity/associated sxs/prior Treatment) HPI Comments: Patient sent to the emergency department from nursing home for evaluation of fever. Patient reportedly developed fever to 104 and shortness of breath earlier today. Patient was given Tylenol and sent to the ER for evaluation of possible pneumonia. Upon arrival to the ER, patient reports that she is tired and weak. She is not experiencing any chest pain. She does not feel short of breath any longer. Patient is complaining of abdominal discomfort. No vomiting or diarrhea.  Patient is a 80 y.o. female presenting with shortness of breath and fever.  Shortness of Breath Associated symptoms: abdominal pain and fever   Fever   Past Medical History  Diagnosis Date  . CAD (coronary artery disease)   . History of CVA (cerebrovascular accident)   . Atrial fibrillation     peristent  . Hyperlipemia   . COPD (chronic obstructive pulmonary disease)   . Epistaxis     recurrent  . Osteoporosis   . Hypothyroidism   . History of pulmonary embolism   . History of colonic diverticulitis   . History of breast cancer   . Asthma   . Iron deficiency anemia   . History of transfusion of whole blood   . LBBB (left bundle branch block)     history of  . Aortal stenosis     severe  . Pacemaker     bradycardia s/p pacemaker implant  . PAD (peripheral artery disease)     left leg occluded viabahn stents x 3 left SFA  . Tachycardia-bradycardia syndrome     s/p PPM  . Cancer   . Arthritis   . Stroke   . HTN (hypertension) 09/25/2012  . HTN (hypertension) 09/25/2012  . Esophageal reflux 01/26/2014  . Left-sided visual neglect 03/03/2014   Past Surgical History  Procedure Laterality Date  .  Thyroidectomy      due to cancer  . Total hip arthroplasty    . Abdominal hysterectomy    . Mitral valve repair    . Intraocular lens insertion      left eye implant  . Pacemaker placement      for tachy/brady syndrome  . Mastectomy     Family History  Problem Relation Age of Onset  . Breast cancer    . Colon cancer    . Diabetes    . Hyperlipidemia    . Hypertension    . Heart disease Mother     unknown heart disease   History  Substance Use Topics  . Smoking status: Former Smoker    Quit date: 02/28/1994  . Smokeless tobacco: Never Used     Comment: Quit >20 years  . Alcohol Use: No   OB History    No data available     Review of Systems  Constitutional: Positive for fever and fatigue.  Respiratory: Positive for shortness of breath.   Gastrointestinal: Positive for abdominal pain.  All other systems reviewed and are negative.     Allergies  Donepezil hydrochloride; Oxycodone-acetaminophen; and Morphine sulfate  Home Medications   Prior to Admission medications   Medication Sig Start Date End Date Taking? Authorizing Provider  acetaminophen (TYLENOL) 650 MG CR tablet Take 650 mg by mouth  as directed. TAKE BY MOUTH EVERY 4 HOURS PRN ---- FEVER   Yes Historical Provider, MD  albuterol (PROVENTIL HFA;VENTOLIN HFA) 108 (90 BASE) MCG/ACT inhaler Inhale 2 puffs into the lungs every 6 (six) hours as needed for wheezing or shortness of breath. 12/31/13  Yes Mosie Lukes, MD  albuterol (PROVENTIL) (2.5 MG/3ML) 0.083% nebulizer solution Take 2.5 mg by nebulization every 4 (four) hours as needed for wheezing or shortness of breath.   Yes Historical Provider, MD  Amino Acids-Protein Hydrolys (FEEDING SUPPLEMENT, PRO-STAT SUGAR FREE 64,) LIQD Take 30 mLs by mouth daily.   Yes Historical Provider, MD  budesonide (PULMICORT) 1 MG/2ML nebulizer solution Take 2 mLs (1 mg total) by nebulization 2 (two) times daily. 04/21/14  Yes Mosie Lukes, MD  budesonide (PULMICORT) 180  MCG/ACT inhaler Inhale 1 puff into the lungs 2 (two) times daily.   Yes Historical Provider, MD  chlorhexidine (PERIDEX) 0.12 % solution 15 mLs by Mouth Rinse route 2 (two) times daily. 03/07/14  Yes Maryann Mikhail, DO  docusate sodium (COLACE) 100 MG capsule Take 100 mg by mouth 2 (two) times daily.   Yes Historical Provider, MD  LEE SALINE NASAL SPRAY NA Place into the nose. 0.65%--- USE 1 (ONE) SPRAY EACH NOSTRIL TWICE DAILY   Yes Historical Provider, MD  levothyroxine (SYNTHROID, LEVOTHROID) 75 MCG tablet Take 75 mcg by mouth daily before breakfast.   Yes Historical Provider, MD  Melatonin 5 MG TABS Take 5 mg by mouth at bedtime.   Yes Historical Provider, MD  memantine (NAMENDA) 10 MG tablet Take 10 mg by mouth 2 (two) times daily.    Yes Historical Provider, MD  Multiple Vitamin (MULTIVITAMIN) tablet Take 1 tablet by mouth daily. WITH MINERALS---PO QD--- WOUND   Yes Historical Provider, MD  NON FORMULARY OXYGEN   Yes Historical Provider, MD  pravastatin (PRAVACHOL) 20 MG tablet Take 20 mg by mouth at bedtime.    Yes Historical Provider, MD  ranitidine (ZANTAC) 300 MG tablet Take 300 mg by mouth at bedtime.   Yes Historical Provider, MD  Rivastigmine (EXELON) 13.3 MG/24HR PT24 Place 1 patch onto the skin daily.   Yes Historical Provider, MD  warfarin (COUMADIN) 5 MG tablet Take 2.5-5 mg by mouth daily at 6 PM. Takes 5mg  on Tues, Thurs and Sat Takes 2.5mg  all other days   Yes Historical Provider, MD  omeprazole (PRILOSEC) 20 MG capsule Take 20 mg by mouth daily.    Historical Provider, MD   BP 165/66 mmHg  Pulse 117  Temp(Src) 97.8 F (36.6 C) (Oral)  Resp 44  Ht 4\' 11"  (1.499 m)  Wt 90 lb (40.824 kg)  BMI 18.17 kg/m2  SpO2 95% Physical Exam  Constitutional: She is oriented to person, place, and time. She appears well-developed and well-nourished. No distress.  HENT:  Head: Normocephalic and atraumatic.  Right Ear: Hearing normal.  Left Ear: Hearing normal.  Nose: Nose normal.   Mouth/Throat: Oropharynx is clear and moist and mucous membranes are normal.  Eyes: Conjunctivae and EOM are normal. Pupils are equal, round, and reactive to light.  Neck: Normal range of motion. Neck supple.  Cardiovascular: S1 normal and S2 normal.  An irregularly irregular rhythm present. Tachycardia present.  Exam reveals no gallop and no friction rub.   No murmur heard. Pulmonary/Chest: Effort normal and breath sounds normal. No respiratory distress. She exhibits no tenderness.  Abdominal: Soft. Normal appearance and bowel sounds are normal. There is no hepatosplenomegaly. There is generalized tenderness. There  is no rebound, no guarding, no tenderness at McBurney's point and negative Murphy's sign. No hernia.  Musculoskeletal: Normal range of motion.  Neurological: She is alert and oriented to person, place, and time. She has normal strength. No cranial nerve deficit or sensory deficit. Coordination normal. GCS eye subscore is 4. GCS verbal subscore is 5. GCS motor subscore is 6.  Skin: Skin is warm, dry and intact. No rash noted. No cyanosis.  Psychiatric: She has a normal mood and affect. Her speech is normal and behavior is normal. Thought content normal.    ED Course  Procedures (including critical care time) Labs Review Labs Reviewed  CBC WITH DIFFERENTIAL - Abnormal; Notable for the following:    WBC 18.4 (*)    RBC 3.23 (*)    Hemoglobin 9.8 (*)    HCT 29.6 (*)    RDW 15.9 (*)    Neutrophils Relative % 91 (*)    Neutro Abs 16.8 (*)    Lymphocytes Relative 5 (*)    All other components within normal limits  COMPREHENSIVE METABOLIC PANEL - Abnormal; Notable for the following:    Sodium 132 (*)    CO2 15 (*)    Glucose, Bld 206 (*)    BUN 88 (*)    Creatinine, Ser 2.00 (*)    Albumin 2.1 (*)    AST 56 (*)    GFR calc non Af Amer 22 (*)    GFR calc Af Amer 26 (*)    Anion gap 20 (*)    All other components within normal limits  URINALYSIS, ROUTINE W REFLEX  MICROSCOPIC - Abnormal; Notable for the following:    APPearance TURBID (*)    Hgb urine dipstick TRACE (*)    Protein, ur 100 (*)    Leukocytes, UA LARGE (*)    All other components within normal limits  URINE MICROSCOPIC-ADD ON - Abnormal; Notable for the following:    Bacteria, UA MANY (*)    All other components within normal limits  I-STAT CG4 LACTIC ACID, ED - Abnormal; Notable for the following:    Lactic Acid, Venous 3.54 (*)    All other components within normal limits  I-STAT ARTERIAL BLOOD GAS, ED - Abnormal; Notable for the following:    pH, Arterial 7.499 (*)    pCO2 arterial 22.5 (*)    pO2, Arterial 50.0 (*)    Bicarbonate 17.5 (*)    Acid-base deficit 4.0 (*)    All other components within normal limits  URINE CULTURE  CULTURE, BLOOD (ROUTINE X 2)  CULTURE, BLOOD (ROUTINE X 2)  LIPASE, BLOOD  TROPONIN I  PRO B NATRIURETIC PEPTIDE    Imaging Review Dg Chest Port 1 View  06/17/2014   CLINICAL DATA:  Shortness of breath and fever for 1 day  EXAM: PORTABLE CHEST - 1 VIEW  COMPARISON:  03/07/2014  FINDINGS: Cardiac pacemaker. Postoperative changes in the right breast and right axilla. Mild cardiac enlargement. Shallow inspiration with infiltration or atelectasis in the lung bases. This is increasing since previous study. No pneumothorax. Calcified and tortuous aorta with prominent pulmonary outflow tract.  IMPRESSION: Bilateral pleural effusions with basilar atelectasis or infiltration, increasing since previous study. Mild cardiac enlargement with prominent pulmonary outflow tract   Electronically Signed   By: Lucienne Capers M.D.   On: 07/03/2014 21:22     EKG Interpretation   Date/Time:  Friday June 13 2014 20:45:22 EST Ventricular Rate:  125 PR Interval:    QRS Duration:  102 QT Interval:  309 QTC Calculation: 446 R Axis:   -53 Text Interpretation:  Atrial fibrillation LAD, consider left anterior  fascicular block LVH with secondary repolarization  abnormality No  significant change since last tracing other than tachycardia Confirmed by  POLLINA  MD, CHRISTOPHER ([id number]) on 06/14/2014 12:16:53 AM      MDM   Final diagnoses:  Shortness of breath  Fever  Abdominal pain  Atrial fibrillation with RVR  Sepsis, due to unspecified organism   Patient presented to the ER for evaluation of fever and shortness of breath. Patient presents from the nursing home. Patient was very tachypneic upon arrival to the ER, mildly hypoxic with room air oxygen saturation of 90%. Patient reportedly had a temperature of 104 at the nursing home and was given Tylenol before arrival to the ER. She was persistently febrile initially here in the ER. Examination also revealed that she has diffuse abdominal tenderness. This was not mentioned initially by the patient or nursing home staff. Daughter tells me that the patient has a history of diverticulitis. CT scan will be performed to further evaluate for intra-abdominal infection as a cause of her sepsis.  Blood gas did reveal hypoxia with respiratory alkalosis consistent with the tachypnea and hyperventilation seen upon arrival. Patient has significant leukocytosis. She has mild anemia. Comprehensive metabolic panel revealed essentially normal electrolytes, with some evidence of anion gap acidosis. Patient also has significant elevation of BUN/creatinine over her baseline, likely very dehydrated and prerenal.  Chest x-ray shows bilateral pleural effusions with basilar atelectasis or infiltration. Based on her current symptoms, likely healthcare associated pneumonia. Patient was treated for early sepsis with cefepime and vancomycin, fluid bolus. She was initially normotensive, but did become somewhat hypotensive. She did respond to fluid bolus. She is much more awake and alert now, having a conversation with her nurse.  Urinalysis also shows some signs of infection, likely the source of the fever as well. Culture  pending.  Patient was tachycardic upon arrival to the ER. EKG reveals narrow complex tachycardia, likely atrial fibrillation. No evidence of ischemia, no infarct.  I did discuss the case with the patient's daughter, who is her power of attorney. She is not able to get to the ER tonight. Daughter was told that the patient is critically ill and may not survive this illness. She understands, wishes to be called if the patient's additional changes. She did confirm that the patient is a DO NOT RESUSCITATE, DO NOT INTUBATE, no CPR.  CRITICAL CARE Performed by: Orpah Greek   Total critical care time: 31min  Critical care time was exclusive of separately billable procedures and treating other patients.  Critical care was necessary to treat or prevent imminent or life-threatening deterioration.  Critical care was time spent personally by me on the following activities: development of treatment plan with patient and/or surrogate as well as nursing, discussions with consultants, evaluation of patient's response to treatment, examination of patient, obtaining history from patient or surrogate, ordering and performing treatments and interventions, ordering and review of laboratory studies, ordering and review of radiographic studies, pulse oximetry and re-evaluation of patient's condition.   Orpah Greek, MD 06/14/14 [id number]  Orpah Greek, MD 06/14/14 0021

## 2014-06-13 NOTE — Progress Notes (Signed)
   06/12/2014 2232  BiPAP/CPAP/SIPAP  BiPAP/CPAP/SIPAP Pt Type Adult  Mask Type Full face mask  Mask Size Medium  Set Rate 10 breaths/min  Respiratory Rate 56 breaths/min  IPAP 8 cmH20  EPAP 4 cmH2O  Oxygen Percent 30 %  Minute Ventilation 17.6  Leak 44  Peak Inspiratory Pressure (PIP) 8  Tidal Volume (Vt) 655  BiPAP/CPAP/SIPAP BiPAP  Patient Home Equipment No  Auto Titrate No  Press High Alarm 25 cmH2O  Press Low Alarm 5 cmH2O  Patient does not tolerate the BIPAP. She keep asking RT to remove the mask. Patient is not a good candidate for BIPAP.

## 2014-06-13 NOTE — Progress Notes (Signed)
ANTIBIOTIC CONSULT NOTE - INITIAL  Pharmacy Consult for Cefepime, Vancomycin Indication: pneumonia  Allergies  Allergen Reactions  . Donepezil Hydrochloride Palpitations and Other (See Comments)    REACTION: heart fluttering  . Oxycodone-Acetaminophen Other (See Comments)    REACTION: Hallucinations  . Morphine Sulfate Other (See Comments)    unknown    Patient Measurements: Height: 4\' 11"  (149.9 cm) Weight: 90 lb (40.824 kg) IBW/kg (Calculated) : 43.2  Vital Signs: Temp: 97.8 F (36.6 C) (11/06 2035) Temp Source: Oral (11/06 2035) Pulse Rate: 133 (11/06 2036) Intake/Output from previous day:   Intake/Output from this shift:    Labs:  Recent Labs  06/30/2014 2108  WBC 18.4*  HGB 9.8*  PLT 267   Estimated Creatinine Clearance: 22.5 mL/min (by C-G formula based on Cr of 1.24). No results for input(s): VANCOTROUGH, VANCOPEAK, VANCORANDOM, GENTTROUGH, GENTPEAK, GENTRANDOM, TOBRATROUGH, TOBRAPEAK, TOBRARND, AMIKACINPEAK, AMIKACINTROU, AMIKACIN in the last 72 hours.   Microbiology: No results found for this or any previous visit (from the past 720 hour(s)).  Medical History: Past Medical History  Diagnosis Date  . CAD (coronary artery disease)   . History of CVA (cerebrovascular accident)   . Atrial fibrillation     peristent  . Hyperlipemia   . COPD (chronic obstructive pulmonary disease)   . Epistaxis     recurrent  . Osteoporosis   . Hypothyroidism   . History of pulmonary embolism   . History of colonic diverticulitis   . History of breast cancer   . Asthma   . Iron deficiency anemia   . History of transfusion of whole blood   . LBBB (left bundle branch block)     history of  . Aortal stenosis     severe  . Pacemaker     bradycardia s/p pacemaker implant  . PAD (peripheral artery disease)     left leg occluded viabahn stents x 3 left SFA  . Tachycardia-bradycardia syndrome     s/p PPM  . Cancer   . Arthritis   . Stroke   . HTN (hypertension)  09/25/2012  . HTN (hypertension) 09/25/2012  . Esophageal reflux 01/26/2014  . Left-sided visual neglect 03/03/2014    Medications:   (Not in a hospital admission) Assessment: 84 YOF sent to the ED from nursing home for evaluation of shortness of breath and fever of up to 104 F. Chest xray shows bilateral infiltrates. WBC is elevated at 18.4. LA elevated at 3.54. Pt is afebrile in the ED. Patient is scheduled to receive a dose of Vancomycin and Cefepime in the ED.   11/6: Blood Cx x2 >>  11/6: Urine Cx >>   Goal of Therapy:  Vancomycin trough level 15-20 mcg/ml  Plan:  -Cefepime 1 gm IV Q 24 hours -Vancomycin 500 mg IV Q 48 hours -Monitor CBC, renal fx, cultures and patient's clinical progress   Albertina Parr, PharmD.  Clinical Pharmacist Pager [phone]

## 2014-06-14 ENCOUNTER — Emergency Department (HOSPITAL_COMMUNITY): Payer: Medicare Other

## 2014-06-14 ENCOUNTER — Encounter (HOSPITAL_COMMUNITY): Payer: Self-pay | Admitting: Cardiology

## 2014-06-14 DIAGNOSIS — Z952 Presence of prosthetic heart valve: Secondary | ICD-10-CM | POA: Diagnosis not present

## 2014-06-14 DIAGNOSIS — Z9010 Acquired absence of unspecified breast and nipple: Secondary | ICD-10-CM | POA: Diagnosis present

## 2014-06-14 DIAGNOSIS — I482 Chronic atrial fibrillation: Secondary | ICD-10-CM

## 2014-06-14 DIAGNOSIS — L89152 Pressure ulcer of sacral region, stage 2: Secondary | ICD-10-CM | POA: Diagnosis present

## 2014-06-14 DIAGNOSIS — R680 Hypothermia, not associated with low environmental temperature: Secondary | ICD-10-CM | POA: Diagnosis present

## 2014-06-14 DIAGNOSIS — Z7901 Long term (current) use of anticoagulants: Secondary | ICD-10-CM | POA: Diagnosis not present

## 2014-06-14 DIAGNOSIS — J440 Chronic obstructive pulmonary disease with acute lower respiratory infection: Secondary | ICD-10-CM

## 2014-06-14 DIAGNOSIS — I739 Peripheral vascular disease, unspecified: Secondary | ICD-10-CM

## 2014-06-14 DIAGNOSIS — Q6239 Other obstructive defects of renal pelvis and ureter: Secondary | ICD-10-CM | POA: Diagnosis not present

## 2014-06-14 DIAGNOSIS — N136 Pyonephrosis: Secondary | ICD-10-CM | POA: Diagnosis present

## 2014-06-14 DIAGNOSIS — M810 Age-related osteoporosis without current pathological fracture: Secondary | ICD-10-CM | POA: Diagnosis present

## 2014-06-14 DIAGNOSIS — R6521 Severe sepsis with septic shock: Secondary | ICD-10-CM | POA: Diagnosis present

## 2014-06-14 DIAGNOSIS — Z8673 Personal history of transient ischemic attack (TIA), and cerebral infarction without residual deficits: Secondary | ICD-10-CM | POA: Diagnosis not present

## 2014-06-14 DIAGNOSIS — K219 Gastro-esophageal reflux disease without esophagitis: Secondary | ICD-10-CM | POA: Diagnosis present

## 2014-06-14 DIAGNOSIS — I350 Nonrheumatic aortic (valve) stenosis: Secondary | ICD-10-CM | POA: Diagnosis present

## 2014-06-14 DIAGNOSIS — N390 Urinary tract infection, site not specified: Secondary | ICD-10-CM | POA: Diagnosis present

## 2014-06-14 DIAGNOSIS — J189 Pneumonia, unspecified organism: Secondary | ICD-10-CM | POA: Diagnosis present

## 2014-06-14 DIAGNOSIS — R64 Cachexia: Secondary | ICD-10-CM | POA: Diagnosis present

## 2014-06-14 DIAGNOSIS — F0280 Dementia in other diseases classified elsewhere without behavioral disturbance: Secondary | ICD-10-CM | POA: Diagnosis present

## 2014-06-14 DIAGNOSIS — I2510 Atherosclerotic heart disease of native coronary artery without angina pectoris: Secondary | ICD-10-CM | POA: Diagnosis present

## 2014-06-14 DIAGNOSIS — F419 Anxiety disorder, unspecified: Secondary | ICD-10-CM | POA: Diagnosis not present

## 2014-06-14 DIAGNOSIS — K6819 Other retroperitoneal abscess: Secondary | ICD-10-CM | POA: Diagnosis present

## 2014-06-14 DIAGNOSIS — E039 Hypothyroidism, unspecified: Secondary | ICD-10-CM | POA: Diagnosis present

## 2014-06-14 DIAGNOSIS — Z515 Encounter for palliative care: Secondary | ICD-10-CM | POA: Diagnosis not present

## 2014-06-14 DIAGNOSIS — I481 Persistent atrial fibrillation: Secondary | ICD-10-CM

## 2014-06-14 DIAGNOSIS — K5900 Constipation, unspecified: Secondary | ICD-10-CM | POA: Diagnosis not present

## 2014-06-14 DIAGNOSIS — Z87891 Personal history of nicotine dependence: Secondary | ICD-10-CM | POA: Diagnosis not present

## 2014-06-14 DIAGNOSIS — J9601 Acute respiratory failure with hypoxia: Secondary | ICD-10-CM | POA: Diagnosis present

## 2014-06-14 DIAGNOSIS — Y95 Nosocomial condition: Secondary | ICD-10-CM | POA: Diagnosis present

## 2014-06-14 DIAGNOSIS — I4891 Unspecified atrial fibrillation: Secondary | ICD-10-CM

## 2014-06-14 DIAGNOSIS — Z8249 Family history of ischemic heart disease and other diseases of the circulatory system: Secondary | ICD-10-CM | POA: Diagnosis not present

## 2014-06-14 DIAGNOSIS — D509 Iron deficiency anemia, unspecified: Secondary | ICD-10-CM

## 2014-06-14 DIAGNOSIS — A419 Sepsis, unspecified organism: Secondary | ICD-10-CM | POA: Diagnosis present

## 2014-06-14 DIAGNOSIS — N179 Acute kidney failure, unspecified: Secondary | ICD-10-CM | POA: Insufficient documentation

## 2014-06-14 DIAGNOSIS — Z950 Presence of cardiac pacemaker: Secondary | ICD-10-CM | POA: Diagnosis not present

## 2014-06-14 DIAGNOSIS — E785 Hyperlipidemia, unspecified: Secondary | ICD-10-CM | POA: Diagnosis present

## 2014-06-14 DIAGNOSIS — E860 Dehydration: Secondary | ICD-10-CM | POA: Diagnosis present

## 2014-06-14 DIAGNOSIS — Z853 Personal history of malignant neoplasm of breast: Secondary | ICD-10-CM | POA: Diagnosis not present

## 2014-06-14 DIAGNOSIS — R627 Adult failure to thrive: Secondary | ICD-10-CM | POA: Diagnosis present

## 2014-06-14 DIAGNOSIS — I714 Abdominal aortic aneurysm, without rupture: Secondary | ICD-10-CM | POA: Diagnosis present

## 2014-06-14 DIAGNOSIS — Z66 Do not resuscitate: Secondary | ICD-10-CM | POA: Diagnosis present

## 2014-06-14 DIAGNOSIS — Z833 Family history of diabetes mellitus: Secondary | ICD-10-CM | POA: Diagnosis not present

## 2014-06-14 DIAGNOSIS — Z803 Family history of malignant neoplasm of breast: Secondary | ICD-10-CM | POA: Diagnosis not present

## 2014-06-14 DIAGNOSIS — I5033 Acute on chronic diastolic (congestive) heart failure: Secondary | ICD-10-CM | POA: Diagnosis present

## 2014-06-14 DIAGNOSIS — Z86711 Personal history of pulmonary embolism: Secondary | ICD-10-CM | POA: Diagnosis not present

## 2014-06-14 DIAGNOSIS — G309 Alzheimer's disease, unspecified: Secondary | ICD-10-CM | POA: Diagnosis present

## 2014-06-14 DIAGNOSIS — J45909 Unspecified asthma, uncomplicated: Secondary | ICD-10-CM | POA: Diagnosis present

## 2014-06-14 DIAGNOSIS — A4189 Other specified sepsis: Secondary | ICD-10-CM | POA: Diagnosis present

## 2014-06-14 DIAGNOSIS — E874 Mixed disorder of acid-base balance: Secondary | ICD-10-CM | POA: Diagnosis present

## 2014-06-14 DIAGNOSIS — E43 Unspecified severe protein-calorie malnutrition: Secondary | ICD-10-CM | POA: Diagnosis present

## 2014-06-14 DIAGNOSIS — I10 Essential (primary) hypertension: Secondary | ICD-10-CM | POA: Diagnosis present

## 2014-06-14 DIAGNOSIS — N1330 Unspecified hydronephrosis: Secondary | ICD-10-CM | POA: Diagnosis present

## 2014-06-14 DIAGNOSIS — Z681 Body mass index (BMI) 19 or less, adult: Secondary | ICD-10-CM | POA: Diagnosis not present

## 2014-06-14 DIAGNOSIS — B966 Bacteroides fragilis [B. fragilis] as the cause of diseases classified elsewhere: Secondary | ICD-10-CM | POA: Diagnosis present

## 2014-06-14 DIAGNOSIS — N151 Renal and perinephric abscess: Secondary | ICD-10-CM | POA: Diagnosis present

## 2014-06-14 DIAGNOSIS — R0602 Shortness of breath: Secondary | ICD-10-CM | POA: Diagnosis present

## 2014-06-14 DIAGNOSIS — R54 Age-related physical debility: Secondary | ICD-10-CM | POA: Diagnosis present

## 2014-06-14 LAB — PRO B NATRIURETIC PEPTIDE: Pro B Natriuretic peptide (BNP): 29340 pg/mL — ABNORMAL HIGH (ref 0–450)

## 2014-06-14 LAB — CBC WITH DIFFERENTIAL/PLATELET
Basophils Absolute: 0 10*3/uL (ref 0.0–0.1)
Basophils Relative: 0 % (ref 0–1)
Eosinophils Absolute: 0 10*3/uL (ref 0.0–0.7)
Eosinophils Relative: 0 % (ref 0–5)
HCT: 28.5 % — ABNORMAL LOW (ref 36.0–46.0)
Hemoglobin: 9.5 g/dL — ABNORMAL LOW (ref 12.0–15.0)
LYMPHS PCT: 3 % — AB (ref 12–46)
Lymphs Abs: 0.5 10*3/uL — ABNORMAL LOW (ref 0.7–4.0)
MCH: 30.8 pg (ref 26.0–34.0)
MCHC: 33.3 g/dL (ref 30.0–36.0)
MCV: 92.5 fL (ref 78.0–100.0)
MONOS PCT: 4 % (ref 3–12)
Monocytes Absolute: 0.7 10*3/uL (ref 0.1–1.0)
NEUTROS ABS: 14.6 10*3/uL — AB (ref 1.7–7.7)
NEUTROS PCT: 93 % — AB (ref 43–77)
Platelets: 218 10*3/uL (ref 150–400)
RBC: 3.08 MIL/uL — ABNORMAL LOW (ref 3.87–5.11)
RDW: 15.7 % — ABNORMAL HIGH (ref 11.5–15.5)
WBC: 15.8 10*3/uL — ABNORMAL HIGH (ref 4.0–10.5)

## 2014-06-14 LAB — COMPREHENSIVE METABOLIC PANEL
ALK PHOS: 108 U/L (ref 39–117)
ALT: 17 U/L (ref 0–35)
ANION GAP: 17 — AB (ref 5–15)
AST: 43 U/L — ABNORMAL HIGH (ref 0–37)
Albumin: 1.9 g/dL — ABNORMAL LOW (ref 3.5–5.2)
BILIRUBIN TOTAL: 0.9 mg/dL (ref 0.3–1.2)
BUN: 84 mg/dL — AB (ref 6–23)
CHLORIDE: 98 meq/L (ref 96–112)
CO2: 16 meq/L — AB (ref 19–32)
Calcium: 7.9 mg/dL — ABNORMAL LOW (ref 8.4–10.5)
Creatinine, Ser: 1.78 mg/dL — ABNORMAL HIGH (ref 0.50–1.10)
GFR calc Af Amer: 29 mL/min — ABNORMAL LOW (ref >90)
GFR calc non Af Amer: 25 mL/min — ABNORMAL LOW (ref >90)
GLUCOSE: 99 mg/dL (ref 70–99)
Potassium: 3.9 mEq/L (ref 3.7–5.3)
Sodium: 131 mEq/L — ABNORMAL LOW (ref 137–147)
Total Protein: 6.5 g/dL (ref 6.0–8.3)

## 2014-06-14 LAB — PROTIME-INR
INR: 5.59 — AB (ref 0.00–1.49)
INR: 6.11 (ref 0.00–1.49)
Prothrombin Time: 51 seconds — ABNORMAL HIGH (ref 11.6–15.2)
Prothrombin Time: 54.7 seconds — ABNORMAL HIGH (ref 11.6–15.2)

## 2014-06-14 LAB — MRSA PCR SCREENING: MRSA by PCR: NEGATIVE

## 2014-06-14 LAB — LIPASE, BLOOD: Lipase: 14 U/L (ref 11–59)

## 2014-06-14 LAB — INFLUENZA PANEL BY PCR (TYPE A & B)
H1N1 flu by pcr: NOT DETECTED
Influenza A By PCR: NEGATIVE
Influenza B By PCR: NEGATIVE

## 2014-06-14 LAB — STREP PNEUMONIAE URINARY ANTIGEN: STREP PNEUMO URINARY ANTIGEN: NEGATIVE

## 2014-06-14 LAB — LACTIC ACID, PLASMA: Lactic Acid, Venous: 1.9 mmol/L (ref 0.5–2.2)

## 2014-06-14 MED ORDER — LEVALBUTEROL HCL 0.63 MG/3ML IN NEBU
0.6300 mg | INHALATION_SOLUTION | RESPIRATORY_TRACT | Status: DC
  Administered 2014-06-14: 0.63 mg via RESPIRATORY_TRACT
  Filled 2014-06-14: qty 3

## 2014-06-14 MED ORDER — DILTIAZEM HCL 100 MG IV SOLR
5.0000 mg/h | INTRAVENOUS | Status: DC
  Administered 2014-06-14 – 2014-06-16 (×4): 5 mg/h via INTRAVENOUS
  Administered 2014-06-17 (×2): 10 mg/h via INTRAVENOUS
  Filled 2014-06-14: qty 100

## 2014-06-14 MED ORDER — GUAIFENESIN ER 600 MG PO TB12
600.0000 mg | ORAL_TABLET | Freq: Two times a day (BID) | ORAL | Status: DC
  Administered 2014-06-14 – 2014-06-16 (×6): 600 mg via ORAL
  Filled 2014-06-14 (×8): qty 1

## 2014-06-14 MED ORDER — DOCUSATE SODIUM 100 MG PO CAPS
100.0000 mg | ORAL_CAPSULE | Freq: Two times a day (BID) | ORAL | Status: DC
  Administered 2014-06-15 – 2014-06-16 (×3): 100 mg via ORAL
  Filled 2014-06-14 (×5): qty 1

## 2014-06-14 MED ORDER — STARCH (THICKENING) PO POWD
ORAL | Status: DC | PRN
  Filled 2014-06-14: qty 227

## 2014-06-14 MED ORDER — VANCOMYCIN HCL 500 MG IV SOLR
500.0000 mg | INTRAVENOUS | Status: DC
  Administered 2014-06-14: 500 mg via INTRAVENOUS
  Filled 2014-06-14: qty 500

## 2014-06-14 MED ORDER — IPRATROPIUM-ALBUTEROL 0.5-2.5 (3) MG/3ML IN SOLN
3.0000 mL | RESPIRATORY_TRACT | Status: DC
  Administered 2014-06-14: 3 mL via RESPIRATORY_TRACT
  Filled 2014-06-14: qty 3

## 2014-06-14 MED ORDER — CETYLPYRIDINIUM CHLORIDE 0.05 % MT LIQD
7.0000 mL | Freq: Two times a day (BID) | OROMUCOSAL | Status: DC
  Administered 2014-06-14 – 2014-06-19 (×10): 7 mL via OROMUCOSAL

## 2014-06-14 MED ORDER — IPRATROPIUM BROMIDE 0.02 % IN SOLN
0.5000 mg | RESPIRATORY_TRACT | Status: DC
  Administered 2014-06-14 (×3): 0.5 mg via RESPIRATORY_TRACT
  Filled 2014-06-14 (×3): qty 2.5

## 2014-06-14 MED ORDER — IPRATROPIUM BROMIDE 0.02 % IN SOLN
0.5000 mg | Freq: Four times a day (QID) | RESPIRATORY_TRACT | Status: DC
  Administered 2014-06-15 – 2014-06-18 (×12): 0.5 mg via RESPIRATORY_TRACT
  Filled 2014-06-14 (×12): qty 2.5

## 2014-06-14 MED ORDER — DILTIAZEM HCL 25 MG/5ML IV SOLN
10.0000 mg | Freq: Once | INTRAVENOUS | Status: AC
  Administered 2014-06-14: 10 mg via INTRAVENOUS
  Filled 2014-06-14: qty 5

## 2014-06-14 MED ORDER — LEVOTHYROXINE SODIUM 75 MCG PO TABS
75.0000 ug | ORAL_TABLET | Freq: Every day | ORAL | Status: DC
  Administered 2014-06-15 – 2014-06-16 (×2): 75 ug via ORAL
  Filled 2014-06-14 (×6): qty 1

## 2014-06-14 MED ORDER — IPRATROPIUM BROMIDE 0.02 % IN SOLN
0.5000 mg | RESPIRATORY_TRACT | Status: DC
  Administered 2014-06-14: 0.5 mg via RESPIRATORY_TRACT
  Filled 2014-06-14: qty 2.5

## 2014-06-14 MED ORDER — PANTOPRAZOLE SODIUM 40 MG PO TBEC
40.0000 mg | DELAYED_RELEASE_TABLET | Freq: Every day | ORAL | Status: DC
  Administered 2014-06-15 – 2014-06-16 (×2): 40 mg via ORAL
  Filled 2014-06-14 (×2): qty 1

## 2014-06-14 MED ORDER — MEMANTINE HCL 10 MG PO TABS
10.0000 mg | ORAL_TABLET | Freq: Two times a day (BID) | ORAL | Status: DC
Start: 2014-06-14 — End: 2014-06-17
  Administered 2014-06-14 – 2014-06-16 (×6): 10 mg via ORAL
  Filled 2014-06-14 (×9): qty 1

## 2014-06-14 MED ORDER — CHLORHEXIDINE GLUCONATE 0.12 % MT SOLN
15.0000 mL | Freq: Two times a day (BID) | OROMUCOSAL | Status: DC
  Administered 2014-06-14 – 2014-06-19 (×11): 15 mL via OROMUCOSAL
  Filled 2014-06-14 (×7): qty 15

## 2014-06-14 MED ORDER — RIVASTIGMINE 13.3 MG/24HR TD PT24
1.0000 | MEDICATED_PATCH | Freq: Every day | TRANSDERMAL | Status: DC
  Administered 2014-06-14 – 2014-06-17 (×4): 13.3 mg via TRANSDERMAL
  Filled 2014-06-14 (×5): qty 1

## 2014-06-14 MED ORDER — LEVALBUTEROL HCL 0.63 MG/3ML IN NEBU
0.6300 mg | INHALATION_SOLUTION | RESPIRATORY_TRACT | Status: DC
  Administered 2014-06-14 (×3): 0.63 mg via RESPIRATORY_TRACT
  Filled 2014-06-14 (×3): qty 3

## 2014-06-14 MED ORDER — BUDESONIDE 1 MG/2ML IN SUSP
1.0000 mg | Freq: Two times a day (BID) | RESPIRATORY_TRACT | Status: DC
  Filled 2014-06-14: qty 2

## 2014-06-14 MED ORDER — DEXTROSE 5 % IV SOLN: 1.0000 g | Freq: Three times a day (TID) | INTRAVENOUS | Status: DC

## 2014-06-14 MED ORDER — ACETAMINOPHEN 325 MG PO TABS
650.0000 mg | ORAL_TABLET | Freq: Four times a day (QID) | ORAL | Status: DC | PRN
  Administered 2014-06-15: 650 mg via ORAL
  Filled 2014-06-14: qty 2

## 2014-06-14 MED ORDER — BUDESONIDE 0.5 MG/2ML IN SUSP
0.5000 mg | Freq: Two times a day (BID) | RESPIRATORY_TRACT | Status: DC
  Administered 2014-06-14 – 2014-06-17 (×6): 0.5 mg via RESPIRATORY_TRACT
  Filled 2014-06-14 (×10): qty 2

## 2014-06-14 MED ORDER — LEVALBUTEROL HCL 0.63 MG/3ML IN NEBU
0.6300 mg | INHALATION_SOLUTION | Freq: Four times a day (QID) | RESPIRATORY_TRACT | Status: DC
Start: 2014-06-15 — End: 2014-06-18
  Administered 2014-06-15 – 2014-06-18 (×12): 0.63 mg via RESPIRATORY_TRACT
  Filled 2014-06-14 (×27): qty 3

## 2014-06-14 MED ORDER — PRAVASTATIN SODIUM 20 MG PO TABS
20.0000 mg | ORAL_TABLET | Freq: Every day | ORAL | Status: DC
  Administered 2014-06-14 – 2014-06-16 (×3): 20 mg via ORAL
  Filled 2014-06-14 (×5): qty 1

## 2014-06-14 MED ORDER — SODIUM CHLORIDE 0.9 % IV SOLN
INTRAVENOUS | Status: DC
  Administered 2014-06-14: 04:00:00 via INTRAVENOUS

## 2014-06-14 MED ORDER — DEXTROSE 5 % IV SOLN
500.0000 mg | INTRAVENOUS | Status: DC
  Administered 2014-06-14 – 2014-06-15 (×2): 500 mg via INTRAVENOUS
  Filled 2014-06-14 (×3): qty 0.5

## 2014-06-14 MED ORDER — METHYLPREDNISOLONE SODIUM SUCC 125 MG IJ SOLR
60.0000 mg | Freq: Four times a day (QID) | INTRAMUSCULAR | Status: DC
  Administered 2014-06-14 – 2014-06-15 (×6): 60 mg via INTRAVENOUS
  Filled 2014-06-14 (×8): qty 0.96
  Filled 2014-06-14: qty 2

## 2014-06-14 NOTE — Consult Note (Signed)
Urology Consult   Physician requesting consult: Dr. Tana Coast  Reason for consult: Right hydronephrosis  History of Present Illness: Rita Miller is a 80 y.o. with dementia and multiple medical problems who presented to [facility] from her nursing facility with fever to 104 F and complaints of diffuse abdominal pain. She is unable to provide an adequate history due to her dementia. She underwent a CT scan of abdomen and pelvis without contrast which demonstrated right hydronephrosis without ureteral dilation along with fluid collections in the right upper quadrant and right pelvis.  No obstructing ureteral stone or other etiology for obstruction was identified. On review of her prior imaging studies, a CT of the abdomen and pelvis from June 2015 did demonstrate dilation of the right renal pelvis without frank hydronephrosis and without a dilated ureter.  On a CT of the chest from 2011, the lower cuts suggest the possibility of hydronephrosis then as well.  These findings would suggest the possibility of chronic right UPJ obstruction as a possible explanation for her imaging findings.  Her renal function is also acutely impaired with a Cr of 2.0 up from 1.28 at baseline a few months ago.  Her urinalysis demonstrated bacteruria although there was a suggestion of contamination on the specimen with a few squamous cells present.  UA was nitrite negative. Urine and blood cultures are pending.  She is currently on Vancomycin and Cefepime.  She is unable to provide any history herself. Her other medical issues are significant including CAD, severe aortic stenosis, history of CVA, atrial fibrillation, pulmonary embolism, PAD, pacemaker for bradycardia, and COPD.  She is anticoagulated and her INR is currently supratherapeutic at 6.1.  Past Medical History  Diagnosis Date  . CAD (coronary artery disease)   . History of CVA (cerebrovascular accident)   . Atrial fibrillation     peristent  . Hyperlipemia   .  COPD (chronic obstructive pulmonary disease)   . Epistaxis     recurrent  . Osteoporosis   . Hypothyroidism   . History of pulmonary embolism   . History of colonic diverticulitis   . History of breast cancer   . Asthma   . Iron deficiency anemia   . History of transfusion of whole blood   . LBBB (left bundle branch block)     history of  . Aortal stenosis     severe  . Pacemaker     bradycardia s/p pacemaker implant  . PAD (peripheral artery disease)     left leg occluded viabahn stents x 3 left SFA  . Tachycardia-bradycardia syndrome     s/p PPM  . Cancer   . Arthritis   . Stroke   . HTN (hypertension) 09/25/2012  . HTN (hypertension) 09/25/2012  . Esophageal reflux 01/26/2014  . Left-sided visual neglect 03/03/2014    Past Surgical History  Procedure Laterality Date  . Thyroidectomy      due to cancer  . Total hip arthroplasty    . Abdominal hysterectomy    . Mitral valve repair    . Intraocular lens insertion      left eye implant  . Pacemaker placement      for tachy/brady syndrome  . Mastectomy       Current Hospital Medications:  Home meds:    Medication List    ASK your doctor about these medications        acetaminophen 650 MG CR tablet  Commonly known as:  TYLENOL  Take 650 mg by mouth  as directed. TAKE BY MOUTH EVERY 4 HOURS PRN ---- FEVER     albuterol (2.5 MG/3ML) 0.083% nebulizer solution  Commonly known as:  PROVENTIL  Take 2.5 mg by nebulization every 4 (four) hours as needed for wheezing or shortness of breath.     albuterol 108 (90 BASE) MCG/ACT inhaler  Commonly known as:  PROVENTIL HFA;VENTOLIN HFA  Inhale 2 puffs into the lungs every 6 (six) hours as needed for wheezing or shortness of breath.     budesonide 180 MCG/ACT inhaler  Commonly known as:  PULMICORT  Inhale 1 puff into the lungs 2 (two) times daily.     budesonide 1 MG/2ML nebulizer solution  Commonly known as:  PULMICORT  Take 2 mLs (1 mg total) by nebulization 2 (two)  times daily.     chlorhexidine 0.12 % solution  Commonly known as:  PERIDEX  15 mLs by Mouth Rinse route 2 (two) times daily.     docusate sodium 100 MG capsule  Commonly known as:  COLACE  Take 100 mg by mouth 2 (two) times daily.     EXELON 13.3 MG/24HR Pt24  Generic drug:  Rivastigmine  Place 1 patch onto the skin daily.     feeding supplement (PRO-STAT SUGAR FREE 64) Liqd  Take 30 mLs by mouth daily.     LEE SALINE NASAL SPRAY NA  Place into the nose. 0.65%--- USE 1 (ONE) SPRAY EACH NOSTRIL TWICE DAILY     levothyroxine 75 MCG tablet  Commonly known as:  SYNTHROID, LEVOTHROID  Take 75 mcg by mouth daily before breakfast.     Melatonin 5 MG Tabs  Take 5 mg by mouth at bedtime.     memantine 10 MG tablet  Commonly known as:  NAMENDA  Take 10 mg by mouth 2 (two) times daily.     multivitamin tablet  Take 1 tablet by mouth daily. WITH MINERALS---PO QD--- WOUND     NON FORMULARY  OXYGEN     omeprazole 20 MG capsule  Commonly known as:  PRILOSEC  Take 20 mg by mouth daily.     pravastatin 20 MG tablet  Commonly known as:  PRAVACHOL  Take 20 mg by mouth at bedtime.     ranitidine 300 MG tablet  Commonly known as:  ZANTAC  Take 300 mg by mouth at bedtime.     warfarin 5 MG tablet  Commonly known as:  COUMADIN  - Take 2.5-5 mg by mouth daily at 6 PM. Takes 5mg  on Tues, Thurs and Sat  - Takes 2.5mg  all other days        Scheduled Meds: . antiseptic oral rinse  7 mL Mouth Rinse q12n4p  . budesonide (PULMICORT) nebulizer solution  0.5 mg Nebulization BID  . ceFEPime (MAXIPIME) IV  1 g Intravenous Q24H  . chlorhexidine  15 mL Mouth Rinse BID  . docusate sodium  100 mg Oral BID  . guaiFENesin  600 mg Oral BID  . levalbuterol  0.63 mg Nebulization Q4H   And  . ipratropium  0.5 mg Nebulization Q4H  . levothyroxine  75 mcg Oral QAC breakfast  . memantine  10 mg Oral BID  . methylPREDNISolone (SOLU-MEDROL) injection  60 mg Intravenous Q6H  . pantoprazole  40  mg Oral Daily  . pravastatin  20 mg Oral QHS  . Rivastigmine  1 patch Transdermal Daily  . [START ON 06/15/2014] vancomycin  500 mg Intravenous Q48H   Continuous Infusions: . sodium chloride 50 mL/hr at 06/14/14  0700  . diltiazem (CARDIZEM) infusion 5 mg/hr (06/14/14 0401)   PRN Meds:.  Allergies:  Allergies  Allergen Reactions  . Donepezil Hydrochloride Palpitations and Other (See Comments)    REACTION: heart fluttering  . Oxycodone-Acetaminophen Other (See Comments)    REACTION: Hallucinations  . Morphine Sulfate Other (See Comments)    unknown    Family History  Problem Relation Age of Onset  . Breast cancer    . Colon cancer    . Diabetes    . Hyperlipidemia    . Hypertension    . Heart disease Mother     unknown heart disease    Social History:  reports that she quit smoking about 20 years ago. She has never used smokeless tobacco. She reports that she does not drink alcohol or use illicit drugs.  ROS: She is unable to provide any answers about her ROS.  Physical Exam:  Vital signs in last 24 hours: Temp:  [97.2 F (36.2 C)-97.9 F (36.6 C)] 97.6 F (36.4 C) (11/07 1118) Pulse Rate:  [89-135] 89 (11/07 1300) Resp:  [22-56] 38 (11/07 1300) BP: (94-168)/(35-96) 98/49 mmHg (11/07 1300) SpO2:  [90 %-100 %] 94 % (11/07 1300) Weight:  [32.3 kg (71 lb 3.3 oz)-40.824 kg (90 lb)] 32.3 kg (71 lb 3.3 oz) (11/07 0430) Constitutional:  Alert and oriented, No acute distress Cardiovascular: Regular rate and rhythm, No JVD Respiratory: Labored breathing, Lungs with bilateral rhonchi GI: Abdomen is soft.  She appears tender diffusely including her R CVA although it is impossible to localize her pain on exam.  No rebound tenderness or guarding. Lymphatic: No lymphadenopathy Neurologic: Grossly intact Psychiatric: Her affect is flat.  Laboratory Data:   Recent Labs  06/23/2014 2108 06/14/14 0530  WBC 18.4* 15.8*  HGB 9.8* 9.5*  HCT 29.6* 28.5*  PLT 267 218      Recent Labs  07/06/2014 2108 06/14/14 0530  NA 132* 131*  K 4.2 3.9  CL 97 98  GLUCOSE 206* 99  BUN 88* 84*  CALCIUM 8.5 7.9*  CREATININE 2.00* 1.78*     Results for orders placed or performed during the hospital encounter of 06/15/2014 (from the past 24 hour(s))  Urinalysis, Routine w reflex microscopic     Status: Abnormal   Collection Time: 06/23/2014  9:01 PM  Result Value Ref Range   Color, Urine YELLOW YELLOW   APPearance TURBID (A) CLEAR   Specific Gravity, Urine 1.015 1.005 - 1.030   pH 6.5 5.0 - 8.0   Glucose, UA NEGATIVE NEGATIVE mg/dL   Hgb urine dipstick TRACE (A) NEGATIVE   Bilirubin Urine NEGATIVE NEGATIVE   Ketones, ur NEGATIVE NEGATIVE mg/dL   Protein, ur 100 (A) NEGATIVE mg/dL   Urobilinogen, UA 1.0 0.0 - 1.0 mg/dL   Nitrite NEGATIVE NEGATIVE   Leukocytes, UA LARGE (A) NEGATIVE  Urine microscopic-add on     Status: Abnormal   Collection Time: 06/25/2014  9:01 PM  Result Value Ref Range   Squamous Epithelial / LPF RARE RARE   WBC, UA 11-20 <3 WBC/hpf   RBC / HPF 0-2 <3 RBC/hpf   Bacteria, UA MANY (A) RARE  CBC with Differential     Status: Abnormal   Collection Time: 06/12/2014  9:08 PM  Result Value Ref Range   WBC 18.4 (H) 4.0 - 10.5 K/uL   RBC 3.23 (L) 3.87 - 5.11 MIL/uL   Hemoglobin 9.8 (L) 12.0 - 15.0 g/dL   HCT 29.6 (L) 36.0 - 46.0 %  MCV 91.6 78.0 - 100.0 fL   MCH 30.3 26.0 - 34.0 pg   MCHC 33.1 30.0 - 36.0 g/dL   RDW 15.9 (H) 11.5 - 15.5 %   Platelets 267 150 - 400 K/uL   Neutrophils Relative % 91 (H) 43 - 77 %   Neutro Abs 16.8 (H) 1.7 - 7.7 K/uL   Lymphocytes Relative 5 (L) 12 - 46 %   Lymphs Abs 0.8 0.7 - 4.0 K/uL   Monocytes Relative 4 3 - 12 %   Monocytes Absolute 0.7 0.1 - 1.0 K/uL   Eosinophils Relative 0 0 - 5 %   Eosinophils Absolute 0.0 0.0 - 0.7 K/uL   Basophils Relative 0 0 - 1 %   Basophils Absolute 0.0 0.0 - 0.1 K/uL  Comprehensive metabolic panel     Status: Abnormal   Collection Time: 06/12/2014  9:08 PM  Result Value  Ref Range   Sodium 132 (L) 137 - 147 mEq/L   Potassium 4.2 3.7 - 5.3 mEq/L   Chloride 97 96 - 112 mEq/L   CO2 15 (L) 19 - 32 mEq/L   Glucose, Bld 206 (H) 70 - 99 mg/dL   BUN 88 (H) 6 - 23 mg/dL   Creatinine, Ser 2.00 (H) 0.50 - 1.10 mg/dL   Calcium 8.5 8.4 - 10.5 mg/dL   Total Protein 7.0 6.0 - 8.3 g/dL   Albumin 2.1 (L) 3.5 - 5.2 g/dL   AST 56 (H) 0 - 37 U/L   ALT 18 0 - 35 U/L   Alkaline Phosphatase 113 39 - 117 U/L   Total Bilirubin 0.6 0.3 - 1.2 mg/dL   GFR calc non Af Amer 22 (L) >90 mL/min   GFR calc Af Amer 26 (L) >90 mL/min   Anion gap 20 (H) 5 - 15  Lipase, blood     Status: None   Collection Time: 06/22/2014  9:08 PM  Result Value Ref Range   Lipase 20 11 - 59 U/L  Troponin I     Status: None   Collection Time: 07/03/2014  9:08 PM  Result Value Ref Range   Troponin I <0.30 <0.30 ng/mL  Pro b natriuretic peptide (BNP)     Status: Abnormal   Collection Time: 06/23/2014  9:08 PM  Result Value Ref Range   Pro B Natriuretic peptide (BNP) 29340.0 (H) 0 - 450 pg/mL  Protime-INR     Status: Abnormal   Collection Time: 06/28/2014  9:08 PM  Result Value Ref Range   Prothrombin Time 51.0 (H) 11.6 - 15.2 seconds   INR 5.59 (HH) 0.00 - 1.49  I-Stat CG4 Lactic Acid, ED     Status: Abnormal   Collection Time: 07/04/2014  9:10 PM  Result Value Ref Range   Lactic Acid, Venous 3.54 (H) 0.5 - 2.2 mmol/L  I-Stat arterial blood gas, ED     Status: Abnormal   Collection Time: 06/24/2014  9:21 PM  Result Value Ref Range   pH, Arterial 7.499 (H) 7.350 - 7.450   pCO2 arterial 22.5 (L) 35.0 - 45.0 mmHg   pO2, Arterial 50.0 (L) 80.0 - 100.0 mmHg   Bicarbonate 17.5 (L) 20.0 - 24.0 mEq/L   TCO2 18 0 - 100 mmol/L   O2 Saturation 89.0 %   Acid-base deficit 4.0 (H) 0.0 - 2.0 mmol/L   Patient temperature 98.6 F    Collection site BRACHIAL ARTERY    Drawn by Operator    Sample type ARTERIAL   CBC with Differential  Status: Abnormal   Collection Time: 06/14/14  5:30 AM  Result Value Ref Range    WBC 15.8 (H) 4.0 - 10.5 K/uL   RBC 3.08 (L) 3.87 - 5.11 MIL/uL   Hemoglobin 9.5 (L) 12.0 - 15.0 g/dL   HCT 28.5 (L) 36.0 - 46.0 %   MCV 92.5 78.0 - 100.0 fL   MCH 30.8 26.0 - 34.0 pg   MCHC 33.3 30.0 - 36.0 g/dL   RDW 15.7 (H) 11.5 - 15.5 %   Platelets 218 150 - 400 K/uL   Neutrophils Relative % 93 (H) 43 - 77 %   Neutro Abs 14.6 (H) 1.7 - 7.7 K/uL   Lymphocytes Relative 3 (L) 12 - 46 %   Lymphs Abs 0.5 (L) 0.7 - 4.0 K/uL   Monocytes Relative 4 3 - 12 %   Monocytes Absolute 0.7 0.1 - 1.0 K/uL   Eosinophils Relative 0 0 - 5 %   Eosinophils Absolute 0.0 0.0 - 0.7 K/uL   Basophils Relative 0 0 - 1 %   Basophils Absolute 0.0 0.0 - 0.1 K/uL  Comprehensive metabolic panel     Status: Abnormal   Collection Time: 06/14/14  5:30 AM  Result Value Ref Range   Sodium 131 (L) 137 - 147 mEq/L   Potassium 3.9 3.7 - 5.3 mEq/L   Chloride 98 96 - 112 mEq/L   CO2 16 (L) 19 - 32 mEq/L   Glucose, Bld 99 70 - 99 mg/dL   BUN 84 (H) 6 - 23 mg/dL   Creatinine, Ser 1.78 (H) 0.50 - 1.10 mg/dL   Calcium 7.9 (L) 8.4 - 10.5 mg/dL   Total Protein 6.5 6.0 - 8.3 g/dL   Albumin 1.9 (L) 3.5 - 5.2 g/dL   AST 43 (H) 0 - 37 U/L   ALT 17 0 - 35 U/L   Alkaline Phosphatase 108 39 - 117 U/L   Total Bilirubin 0.9 0.3 - 1.2 mg/dL   GFR calc non Af Amer 25 (L) >90 mL/min   GFR calc Af Amer 29 (L) >90 mL/min   Anion gap 17 (H) 5 - 15  Protime-INR     Status: Abnormal   Collection Time: 06/14/14  5:30 AM  Result Value Ref Range   Prothrombin Time 54.7 (H) 11.6 - 15.2 seconds   INR 6.11 (HH) 0.00 - 1.49  Lipase, blood     Status: None   Collection Time: 06/14/14  5:30 AM  Result Value Ref Range   Lipase 14 11 - 59 U/L  Lactic acid, plasma     Status: None   Collection Time: 06/14/14  5:30 AM  Result Value Ref Range   Lactic Acid, Venous 1.9 0.5 - 2.2 mmol/L  MRSA PCR Screening     Status: None   Collection Time: 06/14/14  6:45 AM  Result Value Ref Range   MRSA by PCR NEGATIVE NEGATIVE   Recent Results (from  the past 240 hour(s))  MRSA PCR Screening     Status: None   Collection Time: 06/14/14  6:45 AM  Result Value Ref Range Status   MRSA by PCR NEGATIVE NEGATIVE Final    Comment:        The GeneXpert MRSA Assay (FDA approved for NASAL specimens only), is one component of a comprehensive MRSA colonization surveillance program. It is not intended to diagnose MRSA infection nor to guide or monitor treatment for MRSA infections.     Renal Function:  Recent Labs  06/19/2014 2108 06/14/14  0530  CREATININE 2.00* 1.78*   Estimated Creatinine Clearance: 12.4 mL/min (by C-G formula based on Cr of 1.78).  Radiologic Imaging: Ct Abdomen Pelvis Wo Contrast  06/14/2014   CLINICAL DATA:  Fever and shortness of breath.  Abdominal pain.  EXAM: CT ABDOMEN AND PELVIS WITHOUT CONTRAST  TECHNIQUE: Multidetector CT imaging of the abdomen and pelvis was performed following the standard protocol without IV contrast.  COMPARISON:  01/20/2014  FINDINGS: Small bilateral pleural effusions with basilar atelectasis or consolidation. Extensive vascular calcifications in the Chest involving aorta and Coronary arteries. Cardiac pacemaker.  Unenhanced appearance of the liver, spleen, gallbladder, pancreas, adrenal glands, inferior vena cava, and retroperitoneal lymph nodes is unremarkable except for vascular calcifications. Since the previous study, there is new development of fluid around the liver and right kidney suggesting ascites or reactive edema. There is marked right renal hydronephrosis without definite pyelocaliectasis. Ureteral obstruction is suspected but causes not confirmed on this study. Visualization of the low pelvis and distal ureter is limited due to streak artifact from bilateral hip hardware. Multiple parenchymal lesions in both kidneys probably representing small cysts although too small to characterize. These were present previously. There is extensive calcification of the aorta and iliac arteries.  Abdominal aortic aneurysm measuring 3.8 cm maximal AP diameter. 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 This is similar to previous study. Calcification in the internal and external iliac arteries bilaterally likely represent significant stenosis. Stomach and small bowel do not appear abnormally distended. Diffusely stool-filled colon likely indicating constipation. No free air in the abdomen.  Pelvis: Limited visualization due to surgical hardware in the hips. Visualized bladder wall is not thickened. Prominent stool in the rectosigmoid colon likely indicating constipation. Colonic diverticula are present. Appendix is not identified. Suggestion of focal fluid collection in the right pelvis of nonspecific etiology. This measures about 2.9 cm diameter. This could represent ovarian cyst. Uterus is not identified and may be surgically absent or atrophic. Calcified granulomas in the subcutaneous fat over the gluteal regions consistent with injection granulomas. Diffuse bone demineralization. Endplate compression fractures at L1. No change in appearance of the bones since the previous study.  IMPRESSION: Small bilateral pleural effusions with basilar atelectasis or consolidation bilaterally. New free fluid in the right upper quadrant which may indicate ascites or reactive fluid. Marked hydronephrosis of the right kidney. Etiology is not determined. Abdominal aortic aneurysm with additional eccentric saccular aneurysm. No change since prior study. Extensive vascular calcifications with likely vascular compromise in the iliac arteries bilaterally. Small fluid collection in the right pelvis of nonspecific etiology.   Electronically Signed   By: Lucienne Capers M.D.   On: 06/14/2014 01:18      I independently reviewed the above imaging studies.  Impression/Recommendation:  1. Right hydronephrosis with fever: Her right hydronephrosis very  well may be chronic and due to a congenital right UPJ obstruction based on her prior imaging findings. Without an obvious cause of acute obstruction, this would be most likely and would not indicate a complete obstruction. She cannot undergo percutaneous nephrostomy drainage as her INR is supratherapeutic and it would be risky to reverse her anticoagulation considering her history of PE and A. Fib. Retrograde ureteral stenting would also carry significant risk considering her multiple severe medical comorbidities that would make anesthesia very risky. I would therefore favor continued antibiotic therapy pending her urine cultures considering that she is currently afebrile and hemodynamically stable.  If  she develops further hemodynamic instability or signs of sepsis/persistent fever, will have to weigh the risks of intervention with nephrostomy drainage or ureteral stenting vs the risks of her condition worsening.  This would require a detailed discussion with the family and POA.  2. AKI: This is likely multifactorial.  Again, I think her hydronephrosis is likely chronic and not likely the only cause for her change in renal function.  Would only consider intervention of her right hydronephrosis if her renal function gets significantly worse.  Ambert Virrueta,LES 06/14/2014, 2:28 PM  Pryor Curia. MD   CC: Dr. Tana Coast

## 2014-06-14 NOTE — ED Notes (Signed)
Pt spoke with daughter on speaker phone.

## 2014-06-14 NOTE — Progress Notes (Signed)
ANTIBIOTIC CONSULT NOTE - FOLLOW UP  Pharmacy Consult:  Cefepime Indication:  PNA  Allergies  Allergen Reactions  . Donepezil Hydrochloride Palpitations and Other (See Comments)    REACTION: heart fluttering  . Oxycodone-Acetaminophen Other (See Comments)    REACTION: Hallucinations  . Morphine Sulfate Other (See Comments)    unknown    Patient Measurements: Height: 4\' 11"  (149.9 cm) Weight: 71 lb 3.3 oz (32.3 kg) IBW/kg (Calculated) : 43.2  Vital Signs: Temp: 97.6 F (36.4 C) (11/07 1118) Temp Source: Oral (11/07 1118) BP: 98/49 mmHg (11/07 1300) Pulse Rate: 89 (11/07 1300) Intake/Output from previous day: 11/06 0701 - 11/07 0700 In: 180.8 [I.V.:180.8] Out: -  Intake/Output from this shift: Total I/O In: 100 [I.V.:100] Out: -   Labs:  Recent Labs  06/26/2014 2108 06/14/14 0530  WBC 18.4* 15.8*  HGB 9.8* 9.5*  PLT 267 218  CREATININE 2.00* 1.78*   Estimated Creatinine Clearance: 12.4 mL/min (by C-G formula based on Cr of 1.78). No results for input(s): VANCOTROUGH, VANCOPEAK, VANCORANDOM, GENTTROUGH, GENTPEAK, GENTRANDOM, TOBRATROUGH, TOBRAPEAK, TOBRARND, AMIKACINPEAK, AMIKACINTROU, AMIKACIN in the last 72 hours.   Microbiology: Recent Results (from the past 720 hour(s))  MRSA PCR Screening     Status: None   Collection Time: 06/14/14  6:45 AM  Result Value Ref Range Status   MRSA by PCR NEGATIVE NEGATIVE Final    Comment:        The GeneXpert MRSA Assay (FDA approved for NASAL specimens only), is one component of a comprehensive MRSA colonization surveillance program. It is not intended to diagnose MRSA infection nor to guide or monitor treatment for MRSA infections.       Assessment: 84 YOF continues on vancomycin and cefepime for PNA.  Patient's renal function is improving.  Patient's weigh changed from 90 lbs to 71 lbs per documentation.  RN thinks 71 lbs is more accurate and will assist with weighing the patient again.  Vanc 11/6  >> Cefepime 11/6 >>  11/6 BCx x2 - 11/6 UCx x2 -   Goal of Therapy:  Vancomycin trough level 15-20 mcg/ml   Plan:  - Continue vanc 500mg  IV Q48H - Change cefepime to 500mg  IV Q24H - Monitor renal fxn, clinical progress, vanc trough prior to 3rd/4th dose - Could consider reversing INR and use IV heparin bridge if nephrostomy drainage/stenting is indicated - F/U updated weight   Winnie Umali D. Mina Marble, PharmD, BCPS Pager:  [phone] 06/14/2014, 3:21 PM

## 2014-06-14 NOTE — ED Provider Notes (Signed)
Ct scan results reviewed D/w dr patel.  He will admit to stepdown Pt is awake/alert at this time   Sharyon Cable, MD 06/14/14 [phone]

## 2014-06-14 NOTE — Progress Notes (Signed)
Patient ID: Rita Miller  female  BLT:[id number]    DOB: 1929/11/25    DOA: 06/12/2014  PCP: Penni Homans, MD  Brief history of present illness Patient is a 80 year old female with history of CAD, CVA, A. Fib, COPD, hypothyroidism, on chronic", history of PE, severe aortic stenosis, mitral valve repair, chronic diastolic CHF, pacemaker, presented with shortness of breath, fever from nursing home. Patient was found to have fever of 104, also had mentioned abdominal pain and dyspnea. Chest x-ray showed bilateral pleural effusion with basilar atelectasis or infiltrates. CT of the abdomen and pelvis showed bilateral pleural effusions with basilar atelectasis or consolidation bilaterally, marked hydronephrosis of the right kidney, etiology not determined. Patient was admitted due to SIRS, HCAP, CHF exacerbation ( BNP 29,340), UTI also found to be in atrial fib relation with RVR and was treated with Cardizem. INR supratherapeutic at 6.1.   Assessment/Plan: Principal Problem:   Sepsis from HCAP (healthcare-associated pneumonia), UTI, acute respiratory failure - continue vancomycin, cefepime, follow urine cultures, blood cultures - Follow urine legionella antigen, urine strep antigen, check influenza panel - NPO until swallow evaluation  Active Problems: Bilateral pleural effusions with acute on chronic CHF/diastolic exacerbation - Likely precipitated due to sepsis/pneumonia, creatinine 2.0 at the time of arrival - 2-D echo in 7/15 showed EF of 55%, moderate LVH, very severe aortic stenosis, will likely need diuresis, cardiology consult obtained    Hypothyroidism - continue Synthroid    Severe aortic stenosis - Cardiology consulted, likely not a candidate of aortic valve repair given her multiple medical problems, age and comorbidities    Atrial fibrillation with RVR - continue Cardizem, BP borderline - INR supratherapeutic - Cardiology consult called  Urinary tract infection - Follow  urine culture and sensitivities, continue cefepime    MITRAL VALVE REPLACEMENT, HX OF, pacemaker - On chronic anticoagulation, INR supratherapeutic, monitor closely    COPD (chronic obstructive pulmonary disease) - placed on Xopenex and ipratropium nebs, Solu-Medrol,Pulmicort    Protein-calorie malnutrition, severe - Nutrition consult once able to tolerate diet  Right-sided hydronephrosis:  - urology consulted, CT abdomen and pelvis showed marked right-sided hydronephrosis  Dementia - Continue Namenda   DVT Prophylaxis:INR supratherapeutic  Code Status:DO NOT RESUSCITATE  Family Communication:  Disposition:  Consultants:  Cardiology  Urology  Procedures:  None  Antibiotics:  IV vancomycin  IV cefepime  Subjective: Patient seen and examined, difficult to understand, has some confusion with dementia,still short of breath, no chest pain, fever or chills  Objective: Weight change:   Intake/Output Summary (Last 24 hours) at 06/14/14 0948 Last data filed at 06/14/14 0700  Gross per 24 hour  Intake 180.83 ml  Output      0 ml  Net 180.83 ml   Blood pressure 105/39, pulse 100, temperature 97.2 F (36.2 C), temperature source Axillary, resp. rate 43, height 4\' 11"  (1.499 m), weight 32.3 kg (71 lb 3.3 oz), SpO2 95 %.  Physical Exam: General: Alert and awake, oriented xself CVS: S1-S2 clear,  3/6 SEM Chest: bibasilar crackles with rhonchi Abdomen: soft mildly tender, nondistended, normal bowel sounds  Extremities: no cyanosis, clubbing or edema noted bilaterally   Lab Results: Basic Metabolic Panel:  Recent Labs Lab 06/19/2014 2108 06/14/14 0530  NA 132* 131*  K 4.2 3.9  CL 97 98  CO2 15* 16*  GLUCOSE 206* 99  BUN 88* 84*  CREATININE 2.00* 1.78*  CALCIUM 8.5 7.9*   Liver Function Tests:  Recent Labs Lab 07/07/2014 2108 06/14/14 0530  AST  56* 43*  ALT 18 17  ALKPHOS 113 108  BILITOT 0.6 0.9  PROT 7.0 6.5  ALBUMIN 2.1* 1.9*    Recent  Labs Lab 07/05/2014 2108 06/14/14 0530  LIPASE 20 14   No results for input(s): AMMONIA in the last 168 hours. CBC:  Recent Labs Lab 06/11/2014 2108 06/14/14 0530  WBC 18.4* 15.8*  NEUTROABS 16.8* 14.6*  HGB 9.8* 9.5*  HCT 29.6* 28.5*  MCV 91.6 92.5  PLT 267 218   Cardiac Enzymes:  Recent Labs Lab 06/09/2014 2108  TROPONINI <0.30   BNP: Invalid input(s): POCBNP CBG: No results for input(s): GLUCAP in the last 168 hours.   Micro Results: No results found for this or any previous visit (from the past 240 hour(s)).  Studies/Results: Ct Abdomen Pelvis Wo Contrast  06/14/2014   CLINICAL DATA:  Fever and shortness of breath.  Abdominal pain.  EXAM: CT ABDOMEN AND PELVIS WITHOUT CONTRAST  TECHNIQUE: Multidetector CT imaging of the abdomen and pelvis was performed following the standard protocol without IV contrast.  COMPARISON:  01/20/2014  FINDINGS: Small bilateral pleural effusions with basilar atelectasis or consolidation. Extensive vascular calcifications in the Chest involving aorta and Coronary arteries. Cardiac pacemaker.  Unenhanced appearance of the liver, spleen, gallbladder, pancreas, adrenal glands, inferior vena cava, and retroperitoneal lymph nodes is unremarkable except for vascular calcifications. Since the previous study, there is new development of fluid around the liver and right kidney suggesting ascites or reactive edema. There is marked right renal hydronephrosis without definite pyelocaliectasis. Ureteral obstruction is suspected but causes not confirmed on this study. Visualization of the low pelvis and distal ureter is limited due to streak artifact from bilateral hip hardware. Multiple parenchymal lesions in both kidneys probably representing small cysts although too small to characterize. These were present previously. There is extensive calcification of the aorta and iliac arteries. Abdominal aortic aneurysm measuring 3.8 cm maximal AP diameter. Eccentric saccular  aneurysm arising to the right of the upper abdominal aorta and measuring 2.1 x 1.7 cm diameter. This is similar to previous study. Calcification in the internal and external iliac arteries bilaterally likely represent significant stenosis. Stomach and small bowel do not appear abnormally distended. Diffusely stool-filled colon likely indicating constipation. No free air in the abdomen.  Pelvis: Limited visualization due to surgical hardware in the hips. Visualized bladder wall is not thickened. Prominent stool in the rectosigmoid colon likely indicating constipation. Colonic diverticula are present. Appendix is not identified. Suggestion of focal fluid collection in the right pelvis of nonspecific etiology. This measures about 2.9 cm diameter. This could represent ovarian cyst. Uterus is not identified and may be surgically absent or atrophic. Calcified granulomas in the subcutaneous fat over the gluteal regions consistent with injection granulomas. Diffuse bone demineralization. Endplate compression fractures at L1. No change in appearance of the bones since the previous study.  IMPRESSION: Small bilateral pleural effusions with basilar atelectasis or consolidation bilaterally. New free fluid in the right upper quadrant which may indicate ascites or reactive fluid. Marked hydronephrosis of the right kidney. Etiology is not determined. Abdominal aortic aneurysm with additional eccentric saccular aneurysm. No change since prior study. Extensive vascular calcifications with likely vascular compromise in the iliac arteries bilaterally. Small fluid collection in the right pelvis of nonspecific etiology.   Electronically Signed   By: Lucienne Capers M.D.   On: 06/14/2014 01:18   Dg Chest Port 1 View  07/07/2014   CLINICAL DATA:  Shortness of breath and fever for 1 day  EXAM: PORTABLE CHEST - 1 VIEW  COMPARISON:  03/07/2014  FINDINGS: Cardiac pacemaker. Postoperative changes in the right breast and right axilla. Mild  cardiac enlargement. Shallow inspiration with infiltration or atelectasis in the lung bases. This is increasing since previous study. No pneumothorax. Calcified and tortuous aorta with prominent pulmonary outflow tract.  IMPRESSION: Bilateral pleural effusions with basilar atelectasis or infiltration, increasing since previous study. Mild cardiac enlargement with prominent pulmonary outflow tract   Electronically Signed   By: Lucienne Capers M.D.   On: 06/09/2014 21:22    Medications: Scheduled Meds: . antiseptic oral rinse  7 mL Mouth Rinse q12n4p  . budesonide (PULMICORT) nebulizer solution  0.5 mg Nebulization BID  . ceFEPime (MAXIPIME) IV  1 g Intravenous Q24H  . chlorhexidine  15 mL Mouth Rinse BID  . docusate sodium  100 mg Oral BID  . guaiFENesin  600 mg Oral BID  . levalbuterol  0.63 mg Nebulization Q4H   And  . ipratropium  0.5 mg Nebulization Q4H  . levothyroxine  75 mcg Oral QAC breakfast  . memantine  10 mg Oral BID  . methylPREDNISolone (SOLU-MEDROL) injection  60 mg Intravenous Q6H  . pantoprazole  40 mg Oral Daily  . pravastatin  20 mg Oral QHS  . Rivastigmine  1 patch Transdermal Daily  . [START ON 06/15/2014] vancomycin  500 mg Intravenous Q48H      LOS: 1 day   RAI,RIPUDEEP M.D. [facility] 06/14/2014, 9:48 AM Pager: [phone]  If 7PM-7AM, please contact night-coverage [email or web address] Password [email or web address]

## 2014-06-14 NOTE — Consult Note (Signed)
Primary cardiologist: Dr. Kirk Ruths Consulting cardiologist: Dr. Satira Sark  Reason for consultation: Atrial fibrillation, congestive heart failure  Clinical Summary Ms. Sandeen is a medically complex 80 y.o.female currently admitted from nursing home with shortness of breath and fevers. She has documented hydronephrosis as well as bilateral pleural effusions. Cardiac history is outlined below. She has persistent atrial fibrillation, heart rates were increased at presentation, now on intravenous diltiazem. She has undergone previous pacemaker placement due to tachycardia-bradycardia syndrome. Also status post previous mitral valve replacement, although has severe aortic stenosis at this time with mean gradient of 64 mmHg, although is a poor candidate for any specific corrective intervention. She has had general failure to thrive and also dementia, currently DO NOT RESUSCITATE.  She does not provide any history at this time. Heart rate on my examination was in the 90s in atrial fibrillation, systolic blood pressure ranging from 100-110. No significant diuresis noted yet based on urine output recordings.creatinine recently 1.7-2.0, pro-BNP when he 9340, troponin I negative.he has evidence of UTI by UA with cultures pending.ECG shows atrial fibrillation with IVCD and repolarization abnormalities.  Most recent echocardiogram in July showed moderate LVH with LVEF 24%, PA systolic pressure 42 at that point.   Allergies  Allergen Reactions  . Donepezil Hydrochloride Palpitations and Other (See Comments)    REACTION: heart fluttering  . Oxycodone-Acetaminophen Other (See Comments)    REACTION: Hallucinations  . Morphine Sulfate Other (See Comments)    unknown    Medications Scheduled Medications: . antiseptic oral rinse  7 mL Mouth Rinse q12n4p  . budesonide (PULMICORT) nebulizer solution  0.5 mg Nebulization BID  . ceFEPime (MAXIPIME) IV  1 g Intravenous Q24H  . chlorhexidine  15  mL Mouth Rinse BID  . docusate sodium  100 mg Oral BID  . guaiFENesin  600 mg Oral BID  . levalbuterol  0.63 mg Nebulization Q4H   And  . ipratropium  0.5 mg Nebulization Q4H  . levothyroxine  75 mcg Oral QAC breakfast  . memantine  10 mg Oral BID  . methylPREDNISolone (SOLU-MEDROL) injection  60 mg Intravenous Q6H  . pantoprazole  40 mg Oral Daily  . pravastatin  20 mg Oral QHS  . Rivastigmine  1 patch Transdermal Daily  . [START ON 06/15/2014] vancomycin  500 mg Intravenous Q48H    Infusions: . sodium chloride 50 mL/hr at 06/14/14 0700  . diltiazem (CARDIZEM) infusion 5 mg/hr (06/14/14 0401)     Past Medical History  Diagnosis Date  . CAD (coronary artery disease)   . History of CVA (cerebrovascular accident)   . Atrial fibrillation     peristent  . Hyperlipemia   . COPD (chronic obstructive pulmonary disease)   . Epistaxis     recurrent  . Osteoporosis   . Hypothyroidism   . History of pulmonary embolism   . History of colonic diverticulitis   . History of breast cancer   . Asthma   . Iron deficiency anemia   . History of transfusion of whole blood   . LBBB (left bundle branch block)     history of  . Aortal stenosis     severe  . Pacemaker     bradycardia s/p pacemaker implant  . PAD (peripheral artery disease)     left leg occluded viabahn stents x 3 left SFA  . Tachycardia-bradycardia syndrome     s/p PPM  . Cancer   . Arthritis   . Stroke   . HTN (  hypertension) 09/25/2012  . Esophageal reflux 01/26/2014  . Left-sided visual neglect 03/03/2014    Past Surgical History  Procedure Laterality Date  . Thyroidectomy      due to cancer  . Total hip arthroplasty    . Abdominal hysterectomy    . Mitral valve repair    . Intraocular lens insertion      left eye implant  . Pacemaker placement      for tachy/brady syndrome  . Mastectomy      Family History  Problem Relation Age of Onset  . Breast cancer    . Colon cancer    . Diabetes    .  Hyperlipidemia    . Hypertension    . Heart disease Mother     unknown heart disease    Social History Ms. Bacot reports that she quit smoking about 20 years ago. She has never used smokeless tobacco. Ms. Degroote reports that she does not drink alcohol.  Review of Systems Complete review of systems unable to be obtained as patient did not answer questions.  Physical Examination Blood pressure 98/49, pulse 89, temperature 97.6 F (36.4 C), temperature source Oral, resp. rate 38, height 4\' 11"  (1.499 m), weight 71 lb 3.3 oz (32.3 kg), SpO2 94 %.  Intake/Output Summary (Last 24 hours) at 06/14/14 1504 Last data filed at 06/14/14 0900  Gross per 24 hour  Intake 280.83 ml  Output      0 ml  Net 280.83 ml   Telemetry: Atrial fibrillation.  Frail, cachectic appearing elderly woman. HEENT: Conjunctiva and lids normal, oropharynx clear. Neck: Supple, elevated JVP, no carotid bruits, no thyromegaly. Lungs: Decreased breath sounds in mid and lower lung zones, nonlabored breathing at rest. Cardiac: Irregularly irregular, no S3, 3/6 systolic murmur, no pericardial rub. Abdomen: Soft, nontender, bowel sounds diminished, no guarding or rebound. Extremities: No pitting edema, muscle wasting, distal pulses 1-2+. Skin: Warm and dry. Musculoskeletal: Muscle wasting noted. Neuropsychiatric: Awake but does not respond to questions.   Lab Results  Basic Metabolic Panel:  Recent Labs Lab 06/22/2014 2108 06/14/14 0530  NA 132* 131*  K 4.2 3.9  CL 97 98  CO2 15* 16*  GLUCOSE 206* 99  BUN 88* 84*  CREATININE 2.00* 1.78*  CALCIUM 8.5 7.9*    Liver Function Tests:  Recent Labs Lab 06/14/2014 2108 06/14/14 0530  AST 56* 43*  ALT 18 17  ALKPHOS 113 108  BILITOT 0.6 0.9  PROT 7.0 6.5  ALBUMIN 2.1* 1.9*    CBC:  Recent Labs Lab 06/15/2014 2108 06/14/14 0530  WBC 18.4* 15.8*  NEUTROABS 16.8* 14.6*  HGB 9.8* 9.5*  HCT 29.6* 28.5*  MCV 91.6 92.5  PLT 267 218    Cardiac  Enzymes:  Recent Labs Lab 06/22/2014 2108  TROPONINI <0.30    Imaging EXAM: PORTABLE CHEST - 1 VIEW  COMPARISON: 03/07/2014  FINDINGS: Cardiac pacemaker. Postoperative changes in the right breast and right axilla. Mild cardiac enlargement. Shallow inspiration with infiltration or atelectasis in the lung bases. This is increasing since previous study. No pneumothorax. Calcified and tortuous aorta with prominent pulmonary outflow tract.  IMPRESSION: Bilateral pleural effusions with basilar atelectasis or infiltration, increasing since previous study. Mild cardiac enlargement with prominent pulmonary outflow tract   Impression  1. Atrial fibrillation, persistent, heart rate increased at presentation although stabilized in the 90s on intravenous diltiazem. She was on Coumadin as an outpatient, last saw Dr. Stanford Breed in late October.  2. Diastolic heart failure, acute on chronic,,  located by atrial fibrillation and severe aortic stenosis. Pleural effusions on examination.  3. Severe aortic stenosis, not candidate for corrective surgery/procedure.  4. History of tachycardia-bradycardia syndrome status post pacemaker placement.  5. Failure to thrive and dementia, currently DO NOT RESUSCITATE status.  6. UTI with hydronephrosis.   Recommendations  Patient is currently on broad-spectrum antibiotics for management of her infection, has had a Urological consultation as well. From a cardiac perspective, would continue intravenous diltiazem for heart rate control.can transition to oral regimen as tolerated. She seems to be quite frail and debilitated at baseline with declining status. It would be prudent to consider a Palliative Care consultation.  Satira Sark, M.D., F.A.C.C.

## 2014-06-14 NOTE — Evaluation (Addendum)
Clinical/Bedside Swallow Evaluation Patient Details  Name: Chrisy Hillebrand MRN: [id number] Date of Birth: 24-Nov-1929  Today's Date: 06/14/2014 Time: 2025-4270 SLP Time Calculation (min): 30 min  Past Medical History:  Past Medical History  Diagnosis Date  . CAD (coronary artery disease)   . History of CVA (cerebrovascular accident)   . Atrial fibrillation     peristent  . Hyperlipemia   . COPD (chronic obstructive pulmonary disease)   . Epistaxis     recurrent  . Osteoporosis   . Hypothyroidism   . History of pulmonary embolism   . History of colonic diverticulitis   . History of breast cancer   . Asthma   . Iron deficiency anemia   . History of transfusion of whole blood   . LBBB (left bundle branch block)     history of  . Aortal stenosis     severe  . Pacemaker     bradycardia s/p pacemaker implant  . PAD (peripheral artery disease)     left leg occluded viabahn stents x 3 left SFA  . Tachycardia-bradycardia syndrome     s/p PPM  . Cancer   . Arthritis   . Stroke   . HTN (hypertension) 09/25/2012  . HTN (hypertension) 09/25/2012  . Esophageal reflux 01/26/2014  . Left-sided visual neglect 03/03/2014   Past Surgical History:  Past Surgical History  Procedure Laterality Date  . Thyroidectomy      due to cancer  . Total hip arthroplasty    . Abdominal hysterectomy    . Mitral valve repair    . Intraocular lens insertion      left eye implant  . Pacemaker placement      for tachy/brady syndrome  . Mastectomy     HPI:  Pt is a 80 y.o. female with PMH of coronary artery disease, CVA, A. Fib, COPD, hypothyroidism, pulmonary embolism, on chronic anticoagulation, anemia, severe aortic stenosis, chronic diastolic dysfunction, pacemaker implant, presented to ED 11/6 with shortness of breath and fever. SNF resident. Baseline dependent for most ADLs. CXR 11/6 revealed bilateral pleural effusions with basilar atelectasis or infiltration. During recent admission July 2015,  puree/ thin diet initially recommended, then downgraded to nectar. unclear what consistency pt has had at SNF. Bedside swallow eval ordered to aide in r/o of aspiration.   Assessment / Plan / Recommendation Clinical Impression  Pt demonstrated overt s/s of aspiration of thin liquids- weak cough following about 50% of trials, suspect mildly delayed swallow initiation. No s/s of aspiration noted with trials of nectar thick liquids or puree consistency. O2s remained relatively stable in low 90s throughout evaluation. Given current respiratory status and history of dysphagia, the pt is at high risk of aspiration with regular/ thin liquid diet; with nectar thick liquids and puree, risk appears mild with supervision. Recommend initiating dysphagia 1/ nectar thick liquid diet, meds crushed in puree. Full supervision to cue pt to take small sips and to aide in self-feeding, monitoring O2s. ST will continue to follow for diet tolerance/ advancement.     Aspiration Risk  Moderate    Diet Recommendation Dysphagia 1 (Puree);Nectar-thick liquid   Liquid Administration via: Cup;Straw Medication Administration: Crushed with puree Supervision: Staff to assist with self feeding;Full supervision/cueing for compensatory strategies Compensations: Slow rate;Small sips/bites Postural Changes and/or Swallow Maneuvers: Seated upright 90 degrees;Upright 30-60 min after meal    Other  Recommendations Oral Care Recommendations: Oral care BID   Follow Up Recommendations  Skilled Nursing facility    Frequency and  Duration min 2x/week  2 weeks   Pertinent Vitals/Pain n/a    SLP Swallow Goals     Swallow Study Prior Functional Status       General HPI: Pt is a 80 y.o. female with PMH of coronary artery disease, CVA, A. Fib, COPD, hypothyroidism, pulmonary embolism, on chronic anticoagulation, anemia, severe aortic stenosis, chronic diastolic dysfunction, pacemaker implant, presented to ED 11/6 with shortness of  breath and fever. SNF resident. Baseline dependent for most ADLs. CXR 11/6 revealed bilateral pleural effusions with basilar atelectasis or infiltration. During recent admission July 2015, puree/ thin diet initially recommended, then downgraded to nectar. unclear what consistency pt has had at SNF. Bedside swallow eval ordered to aide in r/o of aspiration. Type of Study: Bedside swallow evaluation Previous Swallow Assessment: BSE July 2015- dysphagia 1/ thin, later downgrade to nectar Diet Prior to this Study: NPO Temperature Spikes Noted: No Respiratory Status: Nasal cannula History of Recent Intubation: No Behavior/Cognition: Alert;Cooperative Oral Cavity - Dentition: Dentures, not available Self-Feeding Abilities: Needs assist Patient Positioning: Upright in bed Baseline Vocal Quality: Breathy;Hoarse Volitional Cough: Weak Volitional Swallow: Unable to elicit    Oral/Motor/Sensory Function Overall Oral Motor/Sensory Function: Appears within functional limits for tasks assessed   Ice Chips Ice chips: Within functional limits Presentation: Spoon   Thin Liquid Thin Liquid: Impaired Presentation: Cup Pharyngeal  Phase Impairments: Cough - Immediate;Suspected delayed Swallow    Nectar Thick Nectar Thick Liquid: Impaired Presentation: Cup;Straw Pharyngeal Phase Impairments: Suspected delayed Swallow   Honey Thick Honey Thick Liquid: Not tested   Puree Puree: Within functional limits Presentation: Spoon   Solid   GO    Solid: Not tested       Gilmore Laroche, Amy K, MA, CCC-SLP 06/14/2014,10:24 AM

## 2014-06-14 NOTE — ED Notes (Signed)
Reported to Dr. Posey Pronto patient's heart rate reaching 138, BP 138/61.  MD acknowledges and will enter for cardizem drip.

## 2014-06-14 NOTE — Progress Notes (Signed)
CRITICAL VALUE ALERT  Critical value received:  INR 6.11  Date of notification:  06/14/14  Time of notification:  0637  Critical value read back:Yes.    Nurse who received alert:  Everlene Other RN  MD notified (1st page):  Dr. Posey Pronto   Time of first page:  [phone]  MD notified (2nd page):  Time of second page:  Responding MD:  Dr. Posey Pronto   Time MD responded:  [phone]

## 2014-06-14 NOTE — ED Notes (Signed)
Dr. Patel paged.  

## 2014-06-14 NOTE — H&P (Signed)
[facility] History and Physical  Patient: Rita Miller  WUJ:[id number]  DOB: 05/06/1930  DOS: the patient was seen and examined on 06/14/2014 PCP: Penni Homans, MD  Chief Complaint: shortness of breath  HPI: Rita Miller is a 80 y.o. female with Past medical history of coronary artery disease, CVA, A. Fib, COPD, hypothyroidism, pulmonary embolism, on chronic anticoagulation, anemia, severe aortic stenosis, chronic diastolic dysfunction, pacemaker implant. The patient is presenting with complaints of shortness of breath along with fever. Patient is a resident of a nursing home. Today on routine evaluation she was found to have a fever of 104. Patient mentions she has been having abdominal pain that has been present for a while as well as shortness of breath that has been present for a while. Patient denies any fever or chills chest pain cough diarrhea obstipation burning urination or any focal deficit. Patient denies any choking episode.  The patient is coming from [facility]. And at her baseline dependent for most of her ADL.  Review of Systems: as mentioned in the history of present illness.  A Comprehensive review of the other systems is negative.  Past Medical History  Diagnosis Date  . CAD (coronary artery disease)   . History of CVA (cerebrovascular accident)   . Atrial fibrillation     peristent  . Hyperlipemia   . COPD (chronic obstructive pulmonary disease)   . Epistaxis     recurrent  . Osteoporosis   . Hypothyroidism   . History of pulmonary embolism   . History of colonic diverticulitis   . History of breast cancer   . Asthma   . Iron deficiency anemia   . History of transfusion of whole blood   . LBBB (left bundle branch block)     history of  . Aortal stenosis     severe  . Pacemaker     bradycardia s/p pacemaker implant  . PAD (peripheral artery disease)     left leg occluded viabahn stents x 3 left SFA  . Tachycardia-bradycardia syndrome     s/p PPM  .  Cancer   . Arthritis   . Stroke   . HTN (hypertension) 09/25/2012  . HTN (hypertension) 09/25/2012  . Esophageal reflux 01/26/2014  . Left-sided visual neglect 03/03/2014   Past Surgical History  Procedure Laterality Date  . Thyroidectomy      due to cancer  . Total hip arthroplasty    . Abdominal hysterectomy    . Mitral valve repair    . Intraocular lens insertion      left eye implant  . Pacemaker placement      for tachy/brady syndrome  . Mastectomy     Social History:  reports that she quit smoking about 20 years ago. She has never used smokeless tobacco. She reports that she does not drink alcohol or use illicit drugs.  Allergies  Allergen Reactions  . Donepezil Hydrochloride Palpitations and Other (See Comments)    REACTION: heart fluttering  . Oxycodone-Acetaminophen Other (See Comments)    REACTION: Hallucinations  . Morphine Sulfate Other (See Comments)    unknown    Family History  Problem Relation Age of Onset  . Breast cancer    . Colon cancer    . Diabetes    . Hyperlipidemia    . Hypertension    . Heart disease Mother     unknown heart disease    Prior to Admission medications   Medication Sig Start Date End Date Taking? Authorizing Provider  acetaminophen (TYLENOL) 650 MG CR tablet Take 650 mg by mouth as directed. TAKE BY MOUTH EVERY 4 HOURS PRN ---- FEVER   Yes Historical Provider, MD  albuterol (PROVENTIL HFA;VENTOLIN HFA) 108 (90 BASE) MCG/ACT inhaler Inhale 2 puffs into the lungs every 6 (six) hours as needed for wheezing or shortness of breath. 12/31/13  Yes Mosie Lukes, MD  albuterol (PROVENTIL) (2.5 MG/3ML) 0.083% nebulizer solution Take 2.5 mg by nebulization every 4 (four) hours as needed for wheezing or shortness of breath.   Yes Historical Provider, MD  Amino Acids-Protein Hydrolys (FEEDING SUPPLEMENT, PRO-STAT SUGAR FREE 64,) LIQD Take 30 mLs by mouth daily.   Yes Historical Provider, MD  budesonide (PULMICORT) 1 MG/2ML nebulizer solution  Take 2 mLs (1 mg total) by nebulization 2 (two) times daily. 04/21/14  Yes Mosie Lukes, MD  budesonide (PULMICORT) 180 MCG/ACT inhaler Inhale 1 puff into the lungs 2 (two) times daily.   Yes Historical Provider, MD  chlorhexidine (PERIDEX) 0.12 % solution 15 mLs by Mouth Rinse route 2 (two) times daily. 03/07/14  Yes Maryann Mikhail, DO  docusate sodium (COLACE) 100 MG capsule Take 100 mg by mouth 2 (two) times daily.   Yes Historical Provider, MD  LEE SALINE NASAL SPRAY NA Place into the nose. 0.65%--- USE 1 (ONE) SPRAY EACH NOSTRIL TWICE DAILY   Yes Historical Provider, MD  levothyroxine (SYNTHROID, LEVOTHROID) 75 MCG tablet Take 75 mcg by mouth daily before breakfast.   Yes Historical Provider, MD  Melatonin 5 MG TABS Take 5 mg by mouth at bedtime.   Yes Historical Provider, MD  memantine (NAMENDA) 10 MG tablet Take 10 mg by mouth 2 (two) times daily.    Yes Historical Provider, MD  Multiple Vitamin (MULTIVITAMIN) tablet Take 1 tablet by mouth daily. WITH MINERALS---PO QD--- WOUND   Yes Historical Provider, MD  NON FORMULARY OXYGEN   Yes Historical Provider, MD  pravastatin (PRAVACHOL) 20 MG tablet Take 20 mg by mouth at bedtime.    Yes Historical Provider, MD  ranitidine (ZANTAC) 300 MG tablet Take 300 mg by mouth at bedtime.   Yes Historical Provider, MD  Rivastigmine (EXELON) 13.3 MG/24HR PT24 Place 1 patch onto the skin daily.   Yes Historical Provider, MD  warfarin (COUMADIN) 5 MG tablet Take 2.5-5 mg by mouth daily at 6 PM. Takes 5mg  on Tues, Thurs and Sat Takes 2.5mg  all other days   Yes Historical Provider, MD  omeprazole (PRILOSEC) 20 MG capsule Take 20 mg by mouth daily.    Historical Provider, MD    Physical Exam: Filed Vitals:   06/14/14 0345 06/14/14 0430 06/14/14 0500 06/14/14 0509  BP: 115/41 106/48 109/60 109/60  Pulse: 106 107 102 97  Temp:  97.9 F (36.6 C)    TempSrc:  Axillary    Resp: 51 33 52 47  Height:  4\' 11"  (1.499 m)    Weight:  32.3 kg (71 lb 3.3 oz)     SpO2: 92% 90% 94% 93%    General: Alert, Awake and Oriented to Time, Place and Person. Appear in moderate distress Eyes: PERRL ENT: Oral Mucosa clear moist. Neck: mild JVD Cardiovascular: S1 and S2 Present, aortic systolic Murmur, Peripheral Pulses Present Respiratory: Bilateral Air entry equal and Decreased, bilateral extensive rhonchi and Crackles, bilateral expiratory wheezes Abdomen: Bowel Sound present, Soft and diffusely mildly tender Skin: no Rash Extremities: no Pedal edema, no calf tenderness Neurologic: Grossly no focal neuro deficit.  Labs on Admission:  CBC:  Recent Labs  Lab 06/16/2014 2108  WBC 18.4*  NEUTROABS 16.8*  HGB 9.8*  HCT 29.6*  MCV 91.6  PLT 267    CMP     Component Value Date/Time   NA 132* 06/28/2014 2108   NA 138 04/17/2014 1555   K 4.2 06/19/2014 2108   CL 97 06/18/2014 2108   CO2 15* 06/30/2014 2108   GLUCOSE 206* 06/19/2014 2108   GLUCOSE 96 04/17/2014 1555   BUN 88* 06/26/2014 2108   BUN 30* 04/17/2014 1555   CREATININE 2.00* 07/02/2014 2108   CREATININE 1.00 01/17/2014 1352   CALCIUM 8.5 06/09/2014 2108   PROT 7.0 07/01/2014 2108   PROT 8.4 04/17/2014 1555   ALBUMIN 2.1* 06/18/2014 2108   AST 56* 07/04/2014 2108   ALT 18 06/25/2014 2108   ALKPHOS 113 06/09/2014 2108   BILITOT 0.6 06/22/2014 2108   GFRNONAA 22* 06/19/2014 2108   GFRNONAA 49* 11/04/2010 1120   GFRAA 26* 06/30/2014 2108   GFRAA 59* 11/04/2010 1120     Recent Labs Lab 06/30/2014 2108  LIPASE 20   No results for input(s): AMMONIA in the last 168 hours.   Recent Labs Lab 06/16/2014 2108  TROPONINI <0.30   BNP (last 3 results)  Recent Labs  06/25/2014 2108  PROBNP 29340.0*    Radiological Exams on Admission: Ct Abdomen Pelvis Wo Contrast  06/14/2014   CLINICAL DATA:  Fever and shortness of breath.  Abdominal pain.  EXAM: CT ABDOMEN AND PELVIS WITHOUT CONTRAST  TECHNIQUE: Multidetector CT imaging of the abdomen and pelvis was performed following the  standard protocol without IV contrast.  COMPARISON:  01/20/2014  FINDINGS: Small bilateral pleural effusions with basilar atelectasis or consolidation. Extensive vascular calcifications in the Chest involving aorta and Coronary arteries. Cardiac pacemaker.  Unenhanced appearance of the liver, spleen, gallbladder, pancreas, adrenal glands, inferior vena cava, and retroperitoneal lymph nodes is unremarkable except for vascular calcifications. Since the previous study, there is new development of fluid around the liver and right kidney suggesting ascites or reactive edema. There is marked right renal hydronephrosis without definite pyelocaliectasis. Ureteral obstruction is suspected but causes not confirmed on this study. Visualization of the low pelvis and distal ureter is limited due to streak artifact from bilateral hip hardware. Multiple parenchymal lesions in both kidneys probably representing small cysts although too small to characterize. These were present previously. There is extensive calcification of the aorta and iliac arteries. Abdominal aortic aneurysm measuring 3.8 cm maximal AP diameter. 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 This is similar to previous study. Calcification in the internal and external iliac arteries bilaterally likely represent significant stenosis. Stomach and small bowel do not appear abnormally distended. Diffusely stool-filled colon likely indicating constipation. No free air in the abdomen.  Pelvis: Limited visualization due to surgical hardware in the hips. Visualized bladder wall is not thickened. Prominent stool in the rectosigmoid colon likely indicating constipation. Colonic diverticula are present. Appendix is not identified. Suggestion of focal fluid collection in the right pelvis of nonspecific etiology. This measures about 2.9 cm diameter. This could represent ovarian cyst. Uterus is not  identified and may be surgically absent or atrophic. Calcified granulomas in the subcutaneous fat over the gluteal regions consistent with injection granulomas. Diffuse bone demineralization. Endplate compression fractures at L1. No change in appearance of the bones since the previous study.  IMPRESSION: Small bilateral pleural effusions with basilar atelectasis or consolidation bilaterally. New free fluid in  the right upper quadrant which may indicate ascites or reactive fluid. Marked hydronephrosis of the right kidney. Etiology is not determined. Abdominal aortic aneurysm with additional eccentric saccular aneurysm. No change since prior study. Extensive vascular calcifications with likely vascular compromise in the iliac arteries bilaterally. Small fluid collection in the right pelvis of nonspecific etiology.   Electronically Signed   By: Lucienne Capers M.D.   On: 06/14/2014 01:18   Dg Chest Port 1 View  07/07/2014   CLINICAL DATA:  Shortness of breath and fever for 1 day  EXAM: PORTABLE CHEST - 1 VIEW  COMPARISON:  03/07/2014  FINDINGS: Cardiac pacemaker. Postoperative changes in the right breast and right axilla. Mild cardiac enlargement. Shallow inspiration with infiltration or atelectasis in the lung bases. This is increasing since previous study. No pneumothorax. Calcified and tortuous aorta with prominent pulmonary outflow tract.  IMPRESSION: Bilateral pleural effusions with basilar atelectasis or infiltration, increasing since previous study. Mild cardiac enlargement with prominent pulmonary outflow tract   Electronically Signed   By: Lucienne Capers M.D.   On: 06/08/2014 21:22    EKG: Independently reviewed. atrial fibrillation, RVR.  Assessment/Plan Principal Problem:   Sepsis Active Problems:   Hypothyroidism   Iron deficiency anemia   Severe aortic stenosis   Atrial fibrillation   PACEMAKER-[employer]   PVD (peripheral vascular disease)   COPD (chronic obstructive pulmonary  disease)   HCAP (healthcare-associated pneumonia)   1. Sepsis The patient is presenting with complaints of cough and shortness of breath.chest x-ray shows ammonia. She also has elevated proBNP. Along with that she has mild acute kidney injury, metabolic acidosis secondary to lactic acid elevation as well as acute kidney injury. She also has hypoxia along with leukocytosis and anemia showing chest x-ray pneumonia. With this the patient meets criteria for sepsis. Currently she will be treated with vancomycin and cefepime due to her nursing home status. I would also place her on BiPAP as needed. Dual nebs will be provided. Solu-Medrol will be provided. Follow cultures. Gentle IV hydration currently.  2.history of severe aortic stenosis A. Fib with RVR. Patient appears to be having A. Fib with RVR with persistent heart rate above 120. She'll be treated with Cardizem. Monitor her closely as her blood pressure can drop and she has severe aortic stenosis.  3.hypothyroidism. Continue home Synthroid.  4.anemia. Patient appears to have progressively worsening anemia. At present continue iron. Monitor her for Hemoccult. Warfarin per pharmacy.  5. Abdominal pain. Patient complains of diffuse abdominal pain CT of the abdomen is negative for any acute abnormality other than fluid in the right upper quadrant area. She appears to have a mild UTI. She will be treated with vancomycin and cefepime which will cover her for UTI  Advance goals of care discussion: DNR/DNI based on the documentation   DVT Prophylaxis: on chronic anticoagulation Nutrition: nothing by mouth except medication  Disposition: Admitted to inpatient in step-down unit.  Author: Berle Mull, MD [facility] Pager: [phone] 06/14/2014, 5:45 AM    If 7PM-7AM, please contact night-coverage [email or web address] Password [email or web address]

## 2014-06-14 NOTE — ED Notes (Signed)
Daughter, Laure Kidney, left her number [phone], and will come to see the patient tomorrow.

## 2014-06-14 NOTE — ED Notes (Signed)
Informed receiving RN of colace, namenda, and mucinex due on arrival.

## 2014-06-14 NOTE — ED Notes (Signed)
Dr. Patel, hospitalist, at the bedside.  

## 2014-06-15 DIAGNOSIS — I4891 Unspecified atrial fibrillation: Secondary | ICD-10-CM

## 2014-06-15 LAB — PROTIME-INR
INR: >10 (ref 0.00–1.49)
INR: >10 (ref 0.00–1.49)
INR: >10 (ref 0.00–1.49)
Prothrombin Time: >90 seconds — ABNORMAL HIGH (ref 11.6–15.2)
Prothrombin Time: >90 seconds — ABNORMAL HIGH (ref 11.6–15.2)

## 2014-06-15 LAB — BASIC METABOLIC PANEL
Anion gap: 18 — ABNORMAL HIGH (ref 5–15)
BUN: 92 mg/dL — ABNORMAL HIGH (ref 6–23)
CHLORIDE: 106 meq/L (ref 96–112)
CO2: 14 mEq/L — ABNORMAL LOW (ref 19–32)
Calcium: 8.2 mg/dL — ABNORMAL LOW (ref 8.4–10.5)
Creatinine, Ser: 1.39 mg/dL — ABNORMAL HIGH (ref 0.50–1.10)
GFR calc Af Amer: 40 mL/min — ABNORMAL LOW (ref >90)
GFR, EST NON AFRICAN AMERICAN: 34 mL/min — AB (ref >90)
Glucose, Bld: 181 mg/dL — ABNORMAL HIGH (ref 70–99)
Potassium: 4.4 mEq/L (ref 3.7–5.3)
SODIUM: 138 meq/L (ref 137–147)

## 2014-06-15 LAB — CBC
HEMATOCRIT: 27.5 % — AB (ref 36.0–46.0)
Hemoglobin: 9.3 g/dL — ABNORMAL LOW (ref 12.0–15.0)
MCH: 30.1 pg (ref 26.0–34.0)
MCHC: 33.8 g/dL (ref 30.0–36.0)
MCV: 89 fL (ref 78.0–100.0)
PLATELETS: 171 10*3/uL (ref 150–400)
RBC: 3.09 MIL/uL — AB (ref 3.87–5.11)
RDW: 15.8 % — ABNORMAL HIGH (ref 11.5–15.5)
WBC: 20.5 10*3/uL — AB (ref 4.0–10.5)

## 2014-06-15 MED ORDER — VITAMIN K1 10 MG/ML IJ SOLN
5.0000 mg | Freq: Once | INTRAMUSCULAR | Status: AC
  Administered 2014-06-15: 5 mg via SUBCUTANEOUS
  Filled 2014-06-15: qty 0.5

## 2014-06-15 MED ORDER — FENTANYL CITRATE 0.05 MG/ML IJ SOLN
25.0000 ug | INTRAMUSCULAR | Status: DC | PRN
  Administered 2014-06-15 – 2014-06-16 (×2): 25 ug via INTRAVENOUS
  Administered 2014-06-17: 50 ug via INTRAVENOUS
  Administered 2014-06-17: 25 ug via INTRAVENOUS
  Filled 2014-06-15 (×3): qty 2

## 2014-06-15 MED ORDER — FENTANYL CITRATE 0.05 MG/ML IJ SOLN
12.5000 ug | INTRAMUSCULAR | Status: DC | PRN
  Administered 2014-06-15: 12.5 ug via INTRAVENOUS
  Filled 2014-06-15: qty 2

## 2014-06-15 MED ORDER — ONDANSETRON HCL 4 MG/2ML IJ SOLN
4.0000 mg | Freq: Four times a day (QID) | INTRAMUSCULAR | Status: DC | PRN
  Administered 2014-06-15: 4 mg via INTRAVENOUS
  Filled 2014-06-15: qty 2

## 2014-06-15 MED ORDER — METHYLPREDNISOLONE SODIUM SUCC 125 MG IJ SOLR
60.0000 mg | Freq: Two times a day (BID) | INTRAMUSCULAR | Status: DC
  Administered 2014-06-15: 60 mg via INTRAVENOUS
  Filled 2014-06-15 (×2): qty 0.96

## 2014-06-15 MED ORDER — FENTANYL CITRATE 0.05 MG/ML IJ SOLN: 12.5000 ug | INTRAMUSCULAR | Status: DC | PRN

## 2014-06-15 NOTE — Progress Notes (Signed)
CRITICAL VALUE ALERT  Critical value received:  PT>90, INR>10 and +blood culture- gram -rods Date of notification:  06/15/2014  Time of notification:  2230  Critical value read back:yes  Nurse who received alert:  Glenford Peers RN  MD notified (1st page):  Dr Rogue Bussing  Time of first page:  2231  MD notified (2nd page):  Time of second page:  Responding MD:  DrCallahan  Time MD responded:  2234

## 2014-06-15 NOTE — Progress Notes (Signed)
Pt c/o of pain in abdomen, medicated with 12.5 Fentenyl. No relief, pt continues to complain of abd pain and now nausea. Page placed to Dr Rogue Bussing

## 2014-06-15 NOTE — Progress Notes (Signed)
MD Rai made notified that patient is c/o generalized pain that is unrelieved by her prn Tylenol. No new orders received. Repositioning for comfort and offering emotional support  Will monitor   Lorre Munroe

## 2014-06-15 NOTE — Plan of Care (Signed)
Problem: Phase I Progression Outcomes Goal: Voiding-avoid urinary catheter unless indicated Outcome: Completed/Met Date Met:  06/15/14

## 2014-06-15 NOTE — Progress Notes (Signed)
CRITICAL VALUE ALERT  Critical value received:  PT >90, INR >10    Date of notification:  06/15/2014  Time of notification:  1700  Critical value read back:Yes.    Nurse who received alert:  Markus Daft  MD notified (1st page):  MD Rai    Time of first page:  1701  MD notified (2nd page):  Time of second page:  Responding MD:  MD Rai  Time MD responded:  [phone]

## 2014-06-15 NOTE — Progress Notes (Signed)
CRITICAL VALUE ALERT  Critical value received:  PT >90, INR >10  Date of notification:  06/15/2014  Time of notification:  1504  Critical value read back:Yes.    Nurse who received alert:  Markus Daft  MD notified (1st page):  MD Rai    Time of first page:  1104  MD notified (2nd page):  Time of second page:  Responding MD: MD Rai  Time MD responded:  [phone]

## 2014-06-15 NOTE — Plan of Care (Signed)
Problem: Phase I Progression Outcomes Goal: Code status addressed with pt/family Outcome: Completed/Met Date Met:  06/15/14

## 2014-06-15 NOTE — Progress Notes (Signed)
Patient ID: Rita Miller  female  DEY:[id number]    DOB: [birth year]    DOA: 07/02/2014  PCP: Penni Homans, MD  Brief history of present illness Patient is a 80 year old female with history of CAD, CVA, A. Fib, COPD, hypothyroidism, on chronic", history of PE, severe aortic stenosis, mitral valve repair, chronic diastolic CHF, pacemaker, presented with shortness of breath, fever from nursing home. Patient was found to have fever of 104, also had mentioned abdominal pain and dyspnea. Chest x-ray showed bilateral pleural effusion with basilar atelectasis or infiltrates. CT of the abdomen and pelvis showed bilateral pleural effusions with basilar atelectasis or consolidation bilaterally, marked hydronephrosis of the right kidney, etiology not determined. Patient was admitted due to SIRS, HCAP, CHF exacerbation ( BNP 29,340), UTI also found to be in atrial fib relation with RVR and was treated with Cardizem. INR supratherapeutic at 6.1on admission.   Assessment/Plan: Principal Problem:   Sepsis from HCAP (healthcare-associated pneumonia), UTI, acute respiratory failure: improving, patient much more alert and oriented today - continue vancomycin, cefepime, flu negative, blood cultures negative so far, urine strep antigen negative - urine legionella antigen pending, urine cultures pending  Active Problems: Bilateral pleural effusions with acute on chronic CHF/diastolic exacerbation - likely precipitated due to sepsis/pneumonia, creatinine 2.0 at the time of arrival - 2-D echo in 7/15 showed EF of 55%, moderate LVH, very severe aortic stenosis -cardiology following  AK I: improving  Right-sided marked hydronephrosis with abdominal pain - urology consulted, per Dr. Alinda Money, may be chronic due to congenital right UPJ obstruction however due to multiple severe medical co-morbidities and age, difficult situation for ureteral stenting or nephrostomy drainage. Currently patient is hemodynamically  stable and afebrile, will continue antibiotics and monitor closely.    Hypothyroidism - continue Synthroid    Severe aortic stenosis - Cardiology consulted, likely not a candidate of aortic valve repair given her multiple medical problems, age and comorbidities    Atrial fibrillation with RVR - continue Cardizem, BP borderline - cardiology following check PT/INR  Urinary tract infection - Follow urine culture and sensitivities, continue cefepime    MITRAL VALVE REPLACEMENT, HX OF, pacemaker - On chronic anticoagulation, INR supratherapeutic on admission, check INR    COPD (chronic obstructive pulmonary disease) - placed on Xopenex and ipratropium nebs,Taper Solu-Medrol,Pulmicort    Protein-calorie malnutrition, severe - Nutrition consult once able to tolerate diet  Dementia - Continue Namenda   DVT Prophylaxis: on Coumadin  Code Status:DO NOT RESUSCITATE  Family Communication: called Patient's daughter, [name], unable to make contact, left message  Disposition:  Consultants:  Cardiology  Urology  Procedures:  None  Antibiotics:  IV vancomycin  IV cefepime  Subjective: Patient seen and examined, feels a lot better today, no she is in the hospital, denies any specific complaints. No acute issues overnight. States abdomen still feels sore  Objective: Weight change:   Intake/Output Summary (Last 24 hours) at 06/15/14 0950 Last data filed at 06/15/14 0800  Gross per 24 hour  Intake   1455 ml  Output      0 ml  Net   1455 ml   Blood pressure 120/39, pulse 87, temperature 96.4 F (35.8 C), temperature source Axillary, resp. rate 32, height 4\' 11"  (1.499 m), weight 32.3 kg (71 lb 3.3 oz), SpO2 94 %.  Physical Exam: General: Alert and awake, oriented  CVS: S1-S2 clear,  3/6 SEM Chest:decreased breath at the bases Abdomen: soft mildly tender, nondistended, normal bowel sounds  Extremities: no cyanosis, clubbing  or edema noted bilaterally   Lab  Results: Basic Metabolic Panel:  Recent Labs Lab 07/04/2014 2108 06/14/14 0530  NA 132* 131*  K 4.2 3.9  CL 97 98  CO2 15* 16*  GLUCOSE 206* 99  BUN 88* 84*  CREATININE 2.00* 1.78*  CALCIUM 8.5 7.9*   Liver Function Tests:  Recent Labs Lab 06/11/2014 2108 06/14/14 0530  AST 56* 43*  ALT 18 17  ALKPHOS 113 108  BILITOT 0.6 0.9  PROT 7.0 6.5  ALBUMIN 2.1* 1.9*    Recent Labs Lab 06/14/2014 2108 06/14/14 0530  LIPASE 20 14   No results for input(s): AMMONIA in the last 168 hours. CBC:  Recent Labs Lab 06/12/2014 2108 06/14/14 0530  WBC 18.4* 15.8*  NEUTROABS 16.8* 14.6*  HGB 9.8* 9.5*  HCT 29.6* 28.5*  MCV 91.6 92.5  PLT 267 218   Cardiac Enzymes:  Recent Labs Lab 06/10/2014 2108  TROPONINI <0.30   BNP: Invalid input(s): POCBNP CBG: No results for input(s): GLUCAP in the last 168 hours.   Micro Results: Recent Results (from the past 240 hour(s))  MRSA PCR Screening     Status: None   Collection Time: 06/14/14  6:45 AM  Result Value Ref Range Status   MRSA by PCR NEGATIVE NEGATIVE Final    Comment:        The GeneXpert MRSA Assay (FDA approved for NASAL specimens only), is one component of a comprehensive MRSA colonization surveillance program. It is not intended to diagnose MRSA infection nor to guide or monitor treatment for MRSA infections.     Studies/Results: Ct Abdomen Pelvis Wo Contrast  06/14/2014   CLINICAL DATA:  Fever and shortness of breath.  Abdominal pain.  EXAM: CT ABDOMEN AND PELVIS WITHOUT CONTRAST  TECHNIQUE: Multidetector CT imaging of the abdomen and pelvis was performed following the standard protocol without IV contrast.  COMPARISON:  01/20/2014  FINDINGS: Small bilateral pleural effusions with basilar atelectasis or consolidation. Extensive vascular calcifications in the Chest involving aorta and Coronary arteries. Cardiac pacemaker.  Unenhanced appearance of the liver, spleen, gallbladder, pancreas, adrenal glands, inferior  vena cava, and retroperitoneal lymph nodes is unremarkable except for vascular calcifications. Since the previous study, there is new development of fluid around the liver and right kidney suggesting ascites or reactive edema. There is marked right renal hydronephrosis without definite pyelocaliectasis. Ureteral obstruction is suspected but causes not confirmed on this study. Visualization of the low pelvis and distal ureter is limited due to streak artifact from bilateral hip hardware. Multiple parenchymal lesions in both kidneys probably representing small cysts although too small to characterize. These were present previously. There is extensive calcification of the aorta and iliac arteries. Abdominal aortic aneurysm measuring 3.8 cm maximal AP diameter. 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 This is similar to previous study. Calcification in the internal and external iliac arteries bilaterally likely represent significant stenosis. Stomach and small bowel do not appear abnormally distended. Diffusely stool-filled colon likely indicating constipation. No free air in the abdomen.  Pelvis: Limited visualization due to surgical hardware in the hips. Visualized bladder wall is not thickened. Prominent stool in the rectosigmoid colon likely indicating constipation. Colonic diverticula are present. Appendix is not identified. Suggestion of focal fluid collection in the right pelvis of nonspecific etiology. This measures about 2.9 cm diameter. This could represent ovarian cyst. Uterus is not identified and may be surgically absent or atrophic. Calcified granulomas  in the subcutaneous fat over the gluteal regions consistent with injection granulomas. Diffuse bone demineralization. Endplate compression fractures at L1. No change in appearance of the bones since the previous study.  IMPRESSION: Small bilateral pleural effusions with basilar  atelectasis or consolidation bilaterally. New free fluid in the right upper quadrant which may indicate ascites or reactive fluid. Marked hydronephrosis of the right kidney. Etiology is not determined. Abdominal aortic aneurysm with additional eccentric saccular aneurysm. No change since prior study. Extensive vascular calcifications with likely vascular compromise in the iliac arteries bilaterally. Small fluid collection in the right pelvis of nonspecific etiology.   Electronically Signed   By: Lucienne Capers M.D.   On: 06/14/2014 01:18   Dg Chest Port 1 View  06/18/2014   CLINICAL DATA:  Shortness of breath and fever for 1 day  EXAM: PORTABLE CHEST - 1 VIEW  COMPARISON:  03/07/2014  FINDINGS: Cardiac pacemaker. Postoperative changes in the right breast and right axilla. Mild cardiac enlargement. Shallow inspiration with infiltration or atelectasis in the lung bases. This is increasing since previous study. No pneumothorax. Calcified and tortuous aorta with prominent pulmonary outflow tract.  IMPRESSION: Bilateral pleural effusions with basilar atelectasis or infiltration, increasing since previous study. Mild cardiac enlargement with prominent pulmonary outflow tract   Electronically Signed   By: Lucienne Capers M.D.   On: 06/28/2014 21:22    Medications: Scheduled Meds: . antiseptic oral rinse  7 mL Mouth Rinse q12n4p  . budesonide (PULMICORT) nebulizer solution  0.5 mg Nebulization BID  . ceFEPime (MAXIPIME) IV  500 mg Intravenous Q24H  . chlorhexidine  15 mL Mouth Rinse BID  . docusate sodium  100 mg Oral BID  . guaiFENesin  600 mg Oral BID  . levalbuterol  0.63 mg Nebulization QID   And  . ipratropium  0.5 mg Nebulization QID  . levothyroxine  75 mcg Oral QAC breakfast  . memantine  10 mg Oral BID  . methylPREDNISolone (SOLU-MEDROL) injection  60 mg Intravenous Q6H  . pantoprazole  40 mg Oral Daily  . pravastatin  20 mg Oral QHS  . Rivastigmine  1 patch Transdermal Daily  . vancomycin   500 mg Intravenous Q48H      LOS: 2 days   Gazelle Towe M.D. [facility] 06/15/2014, 9:50 AM Pager: [phone]  If 7PM-7AM, please contact night-coverage [email or web address] Password [email or web address]

## 2014-06-16 ENCOUNTER — Inpatient Hospital Stay (HOSPITAL_COMMUNITY): Payer: Medicare Other

## 2014-06-16 ENCOUNTER — Telehealth: Payer: Self-pay

## 2014-06-16 DIAGNOSIS — N390 Urinary tract infection, site not specified: Secondary | ICD-10-CM | POA: Diagnosis present

## 2014-06-16 DIAGNOSIS — R109 Unspecified abdominal pain: Secondary | ICD-10-CM | POA: Insufficient documentation

## 2014-06-16 DIAGNOSIS — R791 Abnormal coagulation profile: Secondary | ICD-10-CM | POA: Diagnosis present

## 2014-06-16 DIAGNOSIS — IMO0001 Reserved for inherently not codable concepts without codable children: Secondary | ICD-10-CM | POA: Insufficient documentation

## 2014-06-16 DIAGNOSIS — R7881 Bacteremia: Secondary | ICD-10-CM | POA: Insufficient documentation

## 2014-06-16 DIAGNOSIS — I2510 Atherosclerotic heart disease of native coronary artery without angina pectoris: Secondary | ICD-10-CM

## 2014-06-16 DIAGNOSIS — R000 Tachycardia, unspecified: Secondary | ICD-10-CM

## 2014-06-16 DIAGNOSIS — A499 Bacterial infection, unspecified: Secondary | ICD-10-CM

## 2014-06-16 DIAGNOSIS — J449 Chronic obstructive pulmonary disease, unspecified: Secondary | ICD-10-CM

## 2014-06-16 DIAGNOSIS — L89159 Pressure ulcer of sacral region, unspecified stage: Secondary | ICD-10-CM | POA: Diagnosis present

## 2014-06-16 DIAGNOSIS — R6521 Severe sepsis with septic shock: Secondary | ICD-10-CM

## 2014-06-16 DIAGNOSIS — E43 Unspecified severe protein-calorie malnutrition: Secondary | ICD-10-CM | POA: Diagnosis present

## 2014-06-16 LAB — URINALYSIS, ROUTINE W REFLEX MICROSCOPIC
Bilirubin Urine: NEGATIVE
Glucose, UA: NEGATIVE mg/dL
Ketones, ur: NEGATIVE mg/dL
Nitrite: NEGATIVE
PROTEIN: 30 mg/dL — AB
Specific Gravity, Urine: 1.017 (ref 1.005–1.030)
Urobilinogen, UA: 0.2 mg/dL (ref 0.0–1.0)
pH: 6 (ref 5.0–8.0)

## 2014-06-16 LAB — URINE CULTURE

## 2014-06-16 LAB — TYPE AND SCREEN
ABO/RH(D): AB POS
Antibody Screen: NEGATIVE

## 2014-06-16 LAB — CBC
HCT: 27.3 % — ABNORMAL LOW (ref 36.0–46.0)
Hemoglobin: 9.3 g/dL — ABNORMAL LOW (ref 12.0–15.0)
MCH: 30 pg (ref 26.0–34.0)
MCHC: 34.1 g/dL (ref 30.0–36.0)
MCV: 88.1 fL (ref 78.0–100.0)
Platelets: 185 10*3/uL (ref 150–400)
RBC: 3.1 MIL/uL — AB (ref 3.87–5.11)
RDW: 15.9 % — ABNORMAL HIGH (ref 11.5–15.5)
WBC: 25.3 10*3/uL — AB (ref 4.0–10.5)

## 2014-06-16 LAB — GLUCOSE, CAPILLARY
GLUCOSE-CAPILLARY: 202 mg/dL — AB (ref 70–99)
GLUCOSE-CAPILLARY: 118 mg/dL — AB (ref 70–99)
Glucose-Capillary: 166 mg/dL — ABNORMAL HIGH (ref 70–99)
Glucose-Capillary: 162 mg/dL — ABNORMAL HIGH (ref 70–99)

## 2014-06-16 LAB — URINE MICROSCOPIC-ADD ON

## 2014-06-16 LAB — BASIC METABOLIC PANEL
ANION GAP: 17 — AB (ref 5–15)
BUN: 97 mg/dL — ABNORMAL HIGH (ref 6–23)
CHLORIDE: 110 meq/L (ref 96–112)
CO2: 14 meq/L — AB (ref 19–32)
Calcium: 8.2 mg/dL — ABNORMAL LOW (ref 8.4–10.5)
Creatinine, Ser: 1.24 mg/dL — ABNORMAL HIGH (ref 0.50–1.10)
GFR calc Af Amer: 46 mL/min — ABNORMAL LOW (ref >90)
GFR calc non Af Amer: 39 mL/min — ABNORMAL LOW (ref >90)
Glucose, Bld: 197 mg/dL — ABNORMAL HIGH (ref 70–99)
Potassium: 4.1 mEq/L (ref 3.7–5.3)
Sodium: 141 mEq/L (ref 137–147)

## 2014-06-16 LAB — PROCALCITONIN: Procalcitonin: 16.52 ng/mL

## 2014-06-16 LAB — PROTIME-INR
INR: 1.82 — ABNORMAL HIGH (ref 0.00–1.49)
INR: 1.6 — ABNORMAL HIGH (ref 0.00–1.49)
PROTHROMBIN TIME: 80.6 s — AB (ref 11.6–15.2)
Prothrombin Time: 21.2 seconds — ABNORMAL HIGH (ref 11.6–15.2)
Prothrombin Time: 19.2 seconds — ABNORMAL HIGH (ref 11.6–15.2)

## 2014-06-16 LAB — ABO/RH: ABO/RH(D): AB POS

## 2014-06-16 LAB — LACTIC ACID, PLASMA: Lactic Acid, Venous: 1.3 mmol/L (ref 0.5–2.2)

## 2014-06-16 MED ORDER — INSULIN ASPART 100 UNIT/ML ~~LOC~~ SOLN
0.0000 [IU] | Freq: Three times a day (TID) | SUBCUTANEOUS | Status: DC
  Administered 2014-06-16 (×2): 2 [IU] via SUBCUTANEOUS
  Administered 2014-06-16: 3 [IU] via SUBCUTANEOUS
  Administered 2014-06-17: 1 [IU] via SUBCUTANEOUS
  Administered 2014-06-17: 2 [IU] via SUBCUTANEOUS
  Administered 2014-06-18: 1 [IU] via SUBCUTANEOUS

## 2014-06-16 MED ORDER — PRO-STAT SUGAR FREE PO LIQD
30.0000 mL | Freq: Three times a day (TID) | ORAL | Status: DC
  Administered 2014-06-16: 19:00:00 via ORAL
  Filled 2014-06-16 (×5): qty 30

## 2014-06-16 MED ORDER — METHYLPREDNISOLONE SODIUM SUCC 40 MG IJ SOLR
40.0000 mg | Freq: Two times a day (BID) | INTRAMUSCULAR | Status: AC
  Administered 2014-06-16: 40 mg via INTRAVENOUS
  Filled 2014-06-16: qty 1

## 2014-06-16 MED ORDER — METHYLPREDNISOLONE SODIUM SUCC 40 MG IJ SOLR
40.0000 mg | Freq: Two times a day (BID) | INTRAMUSCULAR | Status: DC
  Filled 2014-06-16 (×3): qty 1

## 2014-06-16 MED ORDER — HEPARIN (PORCINE) IN NACL 100-0.45 UNIT/ML-% IJ SOLN
550.0000 [IU]/h | INTRAMUSCULAR | Status: DC
  Administered 2014-06-16: 500 [IU]/h via INTRAVENOUS
  Filled 2014-06-16: qty 250

## 2014-06-16 MED ORDER — SODIUM CHLORIDE 0.9 % IV SOLN
Freq: Once | INTRAVENOUS | Status: AC
  Administered 2014-06-16: 19:00:00 via INTRAVENOUS

## 2014-06-16 MED ORDER — BISACODYL 10 MG RE SUPP
10.0000 mg | Freq: Once | RECTAL | Status: AC
  Administered 2014-06-16: 10 mg via RECTAL
  Filled 2014-06-16: qty 1

## 2014-06-16 MED ORDER — SODIUM CHLORIDE 0.9 % IV SOLN
250.0000 mg | Freq: Two times a day (BID) | INTRAVENOUS | Status: DC
  Administered 2014-06-16 – 2014-06-17 (×3): 250 mg via INTRAVENOUS
  Filled 2014-06-16 (×7): qty 250

## 2014-06-16 MED ORDER — VITAMIN K1 10 MG/ML IJ SOLN
10.0000 mg | Freq: Once | INTRAVENOUS | Status: AC
  Administered 2014-06-16: 10 mg via INTRAVENOUS
  Filled 2014-06-16: qty 1

## 2014-06-16 MED ORDER — INSULIN ASPART 100 UNIT/ML ~~LOC~~ SOLN: 0.0000 [IU] | Freq: Every day | SUBCUTANEOUS | Status: DC

## 2014-06-16 MED ORDER — METHYLPREDNISOLONE SODIUM SUCC 40 MG IJ SOLR
40.0000 mg | Freq: Two times a day (BID) | INTRAMUSCULAR | Status: DC
  Filled 2014-06-16 (×2): qty 1

## 2014-06-16 NOTE — Telephone Encounter (Signed)
FYI

## 2014-06-16 NOTE — Progress Notes (Signed)
Patient ID: Rita Miller, female   DOB: September 11, 1929, 80 y.o.   MRN: [id number]     I spoke with her hospitalist after the CT results became available and she has increased perinephric fluid on the right with stable hydro.   I have suggested that is the family is willing to consider it that with the increased perinephric fluid collection, drainage would be worth considering possibly with a drain in the kidney and perinephric space both.   An attempt will be made to get her anticoagulation reversed.  If her condition worsens and she can have the perc, the a stent could be placed but it wouldn't address the perinephric fluid.

## 2014-06-16 NOTE — Progress Notes (Signed)
ANTICOAGULATION CONSULT NOTE - Initial Consult  Pharmacy Consult for heparin Indication: atrial fibrillation  Allergies  Allergen Reactions  . Donepezil Hydrochloride Palpitations and Other (See Comments)    REACTION: heart fluttering  . Oxycodone-Acetaminophen Other (See Comments)    REACTION: Hallucinations  . Morphine Sulfate Other (See Comments)    unknown    Patient Measurements: Height: 4\' 11"  (149.9 cm) Weight: 71 lb 3.3 oz (32.3 kg) IBW/kg (Calculated) : 43.2 Heparin Dosing Weight: 32kg  Vital Signs: Temp: 97.4 F (36.3 C) (11/09 1512) Temp Source: Oral (11/09 1512) BP: 165/86 mmHg (11/09 1600) Pulse Rate: 106 (11/09 1600)  Labs:  Recent Labs  06/18/2014 2108 06/14/14 0530 06/15/14 0952  06/15/14 2101 06/16/14 0306 06/16/14 1907  HGB 9.8* 9.5* 9.3*  --   --  9.3*  --   HCT 29.6* 28.5* 27.5*  --   --  27.3*  --   PLT 267 218 171  --   --  185  --   LABPROT 51.0* 54.7* >90.0*  < > >90.0* 80.6* 21.2*  INR 5.59* 6.11* >10.00*  < > >10.00* >10.00* 1.82*  CREATININE 2.00* 1.78* 1.39*  --   --  1.24*  --   TROPONINI <0.30  --   --   --   --   --   --   < > = values in this interval not displayed.  Estimated Creatinine Clearance: 17.8 mL/min (by C-G formula based on Cr of 1.24).   Medical History: Past Medical History  Diagnosis Date  . CAD (coronary artery disease)   . History of CVA (cerebrovascular accident)   . Atrial fibrillation     peristent  . Hyperlipemia   . COPD (chronic obstructive pulmonary disease)   . Epistaxis     recurrent  . Osteoporosis   . Hypothyroidism   . History of pulmonary embolism   . History of colonic diverticulitis   . History of breast cancer   . Asthma   . Iron deficiency anemia   . History of transfusion of whole blood   . LBBB (left bundle branch block)     history of  . Aortal stenosis     severe  . Pacemaker     bradycardia s/p pacemaker implant  . PAD (peripheral artery disease)     left leg occluded  viabahn stents x 3 left SFA  . Tachycardia-bradycardia syndrome     s/p PPM  . Cancer   . Arthritis   . Stroke   . HTN (hypertension) 09/25/2012  . Esophageal reflux 01/26/2014  . Left-sided visual neglect 03/03/2014    Medications:  Warfarin   Assessment: 80 year old female with history of afib on coumadin prior to admission.   INR >10 this morning and aggressive reversal started for possible perc drain in am. 10mg  of vit k was given this afternoon along with FFP, INR now 1.8 and pharmacy consulted to bridge with heparin until perc drain can be scheduled. No bolus indicated, will dose cautiously given age and wt (32kg)  Goal of Therapy:  INR 2-3 Heparin level 0.3-0.7 units/ml Monitor platelets by anticoagulation protocol: Yes   Plan:  Start heparin infusion at 500 units/hr Check anti-Xa level in 8 hours and daily while on heparin Continue to monitor H&H and platelets  Erin Hearing PharmD., BCPS Clinical Pharmacist Pager [phone] 06/16/2014 9:06 PM

## 2014-06-16 NOTE — Progress Notes (Signed)
Patient ID: Rita Miller, female   DOB: 1930-02-18, 80 y.o.   MRN: [id number]    Subjective: Pt has been hemodynamically stable.  However, she has been hypothermic and her WBC continues to rise despite antibiotic therapy with cefepime and vancomycin.  Objective: Vital signs in last 24 hours: Temp:  [96.3 F (35.7 C)-96.6 F (35.9 C)] 96.6 F (35.9 C) (11/08 2337) Pulse Rate:  [62-98] 89 (11/09 0700) Resp:  [18-38] 32 (11/09 0700) BP: (103-155)/(32-77) 118/75 mmHg (11/09 0700) SpO2:  [91 %-94 %] 91 % (11/09 0700)  Intake/Output from previous day: 11/08 0701 - 11/09 0700 In: 225 [P.O.:60; I.V.:115; IV Piggyback:50] Out: -  Intake/Output this shift:    Physical Exam:  General: Alert and oriented this morning. Abd: She continues to demonstrate tenderness across her abdomen bilaterally (worse in lower abdomen compared to upper abdomen).  She continues to have R CVAT.  Lab Results:  Recent Labs  06/14/14 0530 06/15/14 0952 06/16/14 0306  HGB 9.5* 9.3* 9.3*  HCT 28.5* 27.5* 27.3*   CBC Latest Ref Rng 06/16/2014 06/15/2014 06/14/2014  WBC 4.0 - 10.5 K/uL 25.3(H) 20.5(H) 15.8(H)  Hemoglobin 12.0 - 15.0 g/dL 9.3(L) 9.3(L) 9.5(L)  Hematocrit 36.0 - 46.0 % 27.3(L) 27.5(L) 28.5(L)  Platelets 150 - 400 K/uL 185 171 218    Lab Results  Component Value Date   INR >10.00* 06/16/2014   INR >10.00* 06/15/2014   INR >10.00* 06/15/2014     BMET  Recent Labs  06/15/14 0952 06/16/14 0306  NA 138 141  K 4.4 4.1  CL 106 110  CO2 14* 14*  GLUCOSE 181* 197*  BUN 92* 97*  CREATININE 1.39* 1.24*  CALCIUM 8.2* 8.2*     Studies/Results: Per nursing staff: Blood culture result called and is positive for GNRs.  Urine and blood cultures pending.   Assessment/Plan: 1) Right hydronephrosis:  Etiology remains unclear although imaging findings appeared consistent with probable congenital UPJ obstruction.  Patient has clinical evidence of progressive infection despite broad spectrum  antibiotics. Renal function is improved.  I am still hesitant to recommend intervention for right hydronephrosis considering substantial risk involved with any intervention.  However, if urine culture proves to be positive and consistent with blood cultures, will need to have a discussion with the family about risks and benefits of intervention such as ureteral stent placement. I would recommend aggressive treatment of INR to help reduce the risks associated with any intervention. Percutaneous drainage of the kidney is currently contraindicated and ureteral stent placement would be with higher risk with INR > 10.   LOS: 3 days   Marielys Trinidad,LES 06/16/2014, 7:18 AM

## 2014-06-16 NOTE — Consult Note (Signed)
[facility]    Date of Admission:  07/07/2014  Date of Consult:  06/16/2014  Reason for Consult: Gram negative bacteremia Referring Physician: Dr. Tana Coast   HPI: Rita Miller is an 80 y.o. female with Past medical history of coronary artery disease, CVA, A. Fib, COPD, hypothyroidism, pulmonary embolism, on chronic anticoagulation, anemia, severe aortic stenosis, chronic diastolic dysfunction, pacemaker implant. She presented with fever abdominal pain and shortness of breath was found to be in septic shock She was found on CT of the abdomen and pelvis to have pleural effusions are new right-sided fluid in the pelvis as well as right-sided hydronephrosis.  Blood cultures drawn on admission have shown gram-negative rods on Gram stain and cultures are still incubating urine cultures Route greater than 100,000 colony-forming units of multiple morphotypes. Despite broad-spectrum antibiotics with vancomycin and cefepime the patient continues to be hypothermic.is my understanding that she has had multiple rounds of antibiotics for urinary tract infections in the past. She has supratherapeutic INR >10 and is with AF with RVR.     Past Medical History  Diagnosis Date  . CAD (coronary artery disease)   . History of CVA (cerebrovascular accident)   . Atrial fibrillation     peristent  . Hyperlipemia   . COPD (chronic obstructive pulmonary disease)   . Epistaxis     recurrent  . Osteoporosis   . Hypothyroidism   . History of pulmonary embolism   . History of colonic diverticulitis   . History of breast cancer   . Asthma   . Iron deficiency anemia   . History of transfusion of whole blood   . LBBB (left bundle branch block)     history of  . Aortal stenosis     severe  . Pacemaker     bradycardia s/p pacemaker implant  . PAD (peripheral artery disease)     left leg occluded viabahn stents x 3 left SFA  . Tachycardia-bradycardia syndrome     s/p PPM  .  Cancer   . Arthritis   . Stroke   . HTN (hypertension) 09/25/2012  . Esophageal reflux 01/26/2014  . Left-sided visual neglect 03/03/2014    Past Surgical History  Procedure Laterality Date  . Thyroidectomy      due to cancer  . Total hip arthroplasty    . Abdominal hysterectomy    . Mitral valve repair    . Intraocular lens insertion      left eye implant  . Pacemaker placement      for tachy/brady syndrome  . Mastectomy    ergies:   Allergies  Allergen Reactions  . Donepezil Hydrochloride Palpitations and Other (See Comments)    REACTION: heart fluttering  . Oxycodone-Acetaminophen Other (See Comments)    REACTION: Hallucinations  . Morphine Sulfate Other (See Comments)    unknown     Medications: I have reviewed patients current medications as documented in Epic Anti-infectives    Start     Dose/Rate Route Frequency Ordered Stop   06/16/14 1100  imipenem-cilastatin (PRIMAXIN) 250 mg in sodium chloride 0.9 % 100 mL IVPB     250 mg200 mL/hr over 30 Minutes Intravenous Every 12 hours 06/16/14 1020     06/15/14 0000  vancomycin (VANCOCIN) 500 mg in sodium chloride 0.9 % 100 mL IVPB  Status:  Discontinued     500 mg100 mL/hr over 60 Minutes Intravenous Every 48 hours 06/17/2014 2304 06/14/14 0007   06/15/14 0000  vancomycin (  VANCOCIN) 500 mg in sodium chloride 0.9 % 100 mL IVPB  Status:  Discontinued     500 mg100 mL/hr over 60 Minutes Intravenous Every 48 hours 06/14/14 0247 06/16/14 0940   06/14/14 2300  ceFEPIme (MAXIPIME) 500 mg in dextrose 5 % 50 mL IVPB  Status:  Discontinued     500 mg100 mL/hr over 30 Minutes Intravenous Every 24 hours 06/14/14 1527 06/16/14 0939   06/14/14 2200  ceFEPIme (MAXIPIME) 1 g in dextrose 5 % 50 mL IVPB  Status:  Discontinued     1 g100 mL/hr over 30 Minutes Intravenous Every 24 hours 07/06/2014 2304 06/14/14 1523   06/14/14 0600  ceFEPIme (MAXIPIME) 1 g in dextrose 5 % 50 mL IVPB  Status:  Discontinued     1 g100 mL/hr over 30 Minutes  Intravenous 3 times per day 06/14/14 0245 06/14/14 0247   06/10/2014 2130  ceFEPIme (MAXIPIME) 2 g in dextrose 5 % 50 mL IVPB     2 g100 mL/hr over 30 Minutes Intravenous  Once 06/18/2014 2127 06/14/14 0300   06/08/2014 2130  vancomycin (VANCOCIN) IVPB 1000 mg/200 mL premix     1,000 mg200 mL/hr over 60 Minutes Intravenous  Once 06/28/2014 2127 06/14/14 0002      Social History:  reports that she quit smoking about 20 years ago. She has never used smokeless tobacco. She reports that she does not drink alcohol or use illicit drugs.  Family History  Problem Relation Age of Onset  . Breast cancer    . Colon cancer    . Diabetes    . Hyperlipidemia    . Hypertension    . Heart disease Mother     unknown heart disease    As in HPI and primary teams notes otherwise 12 point review of systems is negative  Blood pressure 140/53, pulse 89, temperature 97.4 F (36.3 C), temperature source Axillary, resp. rate 29, height 4' 11"  (1.499 m), weight 71 lb 3.3 oz (32.3 kg), SpO2 92 %. General: Alert and awake cachectic HEENT:  EOMI, oropharynx clear and without exudate CVS irregularly irregular, rhythm, tachycardiac,  no murmur rubs or gallops Chest: fairlyclear to auscultation bilaterally, no wheezing, rales or rhonchi Abdomen: soft nontender, tender to palpation diffusely normal bowel sounds Skin: Ecchymoses Neuro: nonfocal, strength and sensation intact   Results for orders placed or performed during the hospital encounter of 06/18/2014 (from the past 48 hour(s))  Protime-INR     Status: Abnormal   Collection Time: 06/15/14  9:52 AM  Result Value Ref Range   Prothrombin Time >90.0 (H) 11.6 - 15.2 seconds    Comment: REPEATED TO VERIFY CRITICAL RESULT CALLED TO, READ BACK BY AND VERIFIED WITH: JARRELL,A RN 06/15/14 1101 WOOTEN,K    INR >10.00 (HH) 0.00 - 1.49    Comment: REPEATED TO VERIFY CRITICAL RESULT CALLED TO, READ BACK BY AND VERIFIED WITH: JARRELL,A RN 06/15/14 1101 WOOTEN,K   Basic  metabolic panel     Status: Abnormal   Collection Time: 06/15/14  9:52 AM  Result Value Ref Range   Sodium 138 137 - 147 mEq/L    Comment: DELTA CHECK NOTED   Potassium 4.4 3.7 - 5.3 mEq/L   Chloride 106 96 - 112 mEq/L   CO2 14 (L) 19 - 32 mEq/L   Glucose, Bld 181 (H) 70 - 99 mg/dL   BUN 92 (H) 6 - 23 mg/dL   Creatinine, Ser 1.39 (H) 0.50 - 1.10 mg/dL   Calcium 8.2 (L) 8.4 -  10.5 mg/dL   GFR calc non Af Amer 34 (L) >90 mL/min   GFR calc Af Amer 40 (L) >90 mL/min    Comment: (NOTE) The eGFR has been calculated using the CKD EPI equation. This calculation has not been validated in all clinical situations. eGFR's persistently <90 mL/min signify possible Chronic Kidney Disease.    Anion gap 18 (H) 5 - 15  CBC     Status: Abnormal   Collection Time: 06/15/14  9:52 AM  Result Value Ref Range   WBC 20.5 (H) 4.0 - 10.5 K/uL   RBC 3.09 (L) 3.87 - 5.11 MIL/uL   Hemoglobin 9.3 (L) 12.0 - 15.0 g/dL   HCT 27.5 (L) 36.0 - 46.0 %   MCV 89.0 78.0 - 100.0 fL   MCH 30.1 26.0 - 34.0 pg   MCHC 33.8 30.0 - 36.0 g/dL   RDW 15.8 (H) 11.5 - 15.5 %   Platelets 171 150 - 400 K/uL  Protime-INR     Status: Abnormal   Collection Time: 06/15/14  3:05 PM  Result Value Ref Range   Prothrombin Time >90.0 (H) 11.6 - 15.2 seconds    Comment: REPEATED TO VERIFY SPECIMEN CHECKED FOR CLOTS CRITICAL RESULT CALLED TO, READ BACK BY AND VERIFIED WITH: JARRELL,A RN 06/15/14 1650 WOOTEN,K    INR >10.00 (HH) 0.00 - 1.49    Comment: REPEATED TO VERIFY SPECIMEN CHECKED FOR CLOTS CRITICAL RESULT CALLED TO, READ BACK BY AND VERIFIED WITH: JARRELL,A RN 06/15/14 [place]     Status: Abnormal   Collection Time: 06/15/14  9:01 PM  Result Value Ref Range   Prothrombin Time >90.0 (H) 11.6 - 15.2 seconds    Comment: REPEATED TO VERIFY   INR >10.00 (HH) 0.00 - 1.49    Comment: REPEATED TO VERIFY CRITICAL RESULT CALLED TO, READ BACK BY AND VERIFIED WITH: Christie Nottingham [id number] [place]  CHECKED FOR CLOTS   CBC     Status: Abnormal   Collection Time: 06/16/14  3:06 AM  Result Value Ref Range   WBC 25.3 (H) 4.0 - 10.5 K/uL   RBC 3.10 (L) 3.87 - 5.11 MIL/uL   Hemoglobin 9.3 (L) 12.0 - 15.0 g/dL   HCT 27.3 (L) 36.0 - 46.0 %   MCV 88.1 78.0 - 100.0 fL   MCH 30.0 26.0 - 34.0 pg   MCHC 34.1 30.0 - 36.0 g/dL   RDW 15.9 (H) 11.5 - 15.5 %   Platelets 185 150 - 400 K/uL  Basic metabolic panel     Status: Abnormal   Collection Time: 06/16/14  3:06 AM  Result Value Ref Range   Sodium 141 137 - 147 mEq/L   Potassium 4.1 3.7 - 5.3 mEq/L   Chloride 110 96 - 112 mEq/L   CO2 14 (L) 19 - 32 mEq/L   Glucose, Bld 197 (H) 70 - 99 mg/dL   BUN 97 (H) 6 - 23 mg/dL   Creatinine, Ser 1.24 (H) 0.50 - 1.10 mg/dL   Calcium 8.2 (L) 8.4 - 10.5 mg/dL   GFR calc non Af Amer 39 (L) >90 mL/min   GFR calc Af Amer 46 (L) >90 mL/min    Comment: (NOTE) The eGFR has been calculated using the CKD EPI equation. This calculation has not been validated in all clinical situations. eGFR's persistently <90 mL/min signify possible Chronic Kidney Disease.    Anion gap 17 (H) 5 - 15  Protime-INR     Status: Abnormal   Collection  Time: 06/16/14  3:06 AM  Result Value Ref Range   Prothrombin Time 80.6 (H) 11.6 - 15.2 seconds   INR >10.00 (HH) 0.00 - 1.49    Comment: REPEATED TO VERIFY CRITICAL RESULT CALLED TO, READ BACK BY AND VERIFIED WITH: Joanna Puff [id number] [facility]   Procalcitonin - Baseline     Status: None   Collection Time: 06/16/14  7:52 AM  Result Value Ref Range   Procalcitonin 16.52 ng/mL    Comment:        Interpretation: PCT >= 10 ng/mL: Important systemic inflammatory response, almost exclusively due to severe bacterial sepsis or septic shock. (NOTE)         ICU PCT Algorithm               Non ICU PCT Algorithm    ----------------------------     ------------------------------         PCT < 0.25 ng/mL                 PCT < 0.1 ng/mL     Stopping of antibiotics             Stopping of antibiotics       strongly encouraged.               strongly encouraged.    ----------------------------     ------------------------------       PCT level decrease by               PCT < 0.25 ng/mL       >= 80% from peak PCT       OR PCT 0.25 - 0.5 ng/mL          Stopping of antibiotics                                             encouraged.     Stopping of antibiotics           encouraged.    ----------------------------     ------------------------------       PCT level decrease by              PCT >= 0.25 ng/mL       < 80% from peak PCT        AND PCT >= 0.5 ng/mL             Continuing antibiotics                                              encouraged.       Continuing antibiotics            encouraged.    ----------------------------     ------------------------------     PCT level increase compared          PCT > 0.5 ng/mL         with peak PCT AND          PCT >= 0.5 ng/mL             Escalation of antibiotics  strongly encouraged.      Escalation of antibiotics        strongly encouraged.   Lactic acid, plasma     Status: None   Collection Time: 06/16/14  7:52 AM  Result Value Ref Range   Lactic Acid, Venous 1.3 0.5 - 2.2 mmol/L  Glucose, capillary     Status: Abnormal   Collection Time: 06/16/14  8:14 AM  Result Value Ref Range   Glucose-Capillary 166 (H) 70 - 99 mg/dL   Comment 1 Capillary Sample   Urinalysis, Routine w reflex microscopic     Status: Abnormal   Collection Time: 06/16/14 11:26 AM  Result Value Ref Range   Color, Urine YELLOW YELLOW   APPearance CLOUDY (A) CLEAR   Specific Gravity, Urine 1.017 1.005 - 1.030   pH 6.0 5.0 - 8.0   Glucose, UA NEGATIVE NEGATIVE mg/dL   Hgb urine dipstick MODERATE (A) NEGATIVE   Bilirubin Urine NEGATIVE NEGATIVE   Ketones, ur NEGATIVE NEGATIVE mg/dL   Protein, ur 30 (A) NEGATIVE mg/dL   Urobilinogen, UA 0.2 0.0 - 1.0 mg/dL   Nitrite NEGATIVE NEGATIVE    Leukocytes, UA SMALL (A) NEGATIVE  Urine microscopic-add on     Status: Abnormal   Collection Time: 06/16/14 11:26 AM  Result Value Ref Range   WBC, UA 7-10 <3 WBC/hpf   RBC / HPF 7-10 <3 RBC/hpf   Bacteria, UA RARE RARE   Casts HYALINE CASTS (A) NEGATIVE  Glucose, capillary     Status: Abnormal   Collection Time: 06/16/14 12:19 PM  Result Value Ref Range   Glucose-Capillary 202 (H) 70 - 99 mg/dL   Comment 1 Capillary Sample    @BRIEFLABTABLE (sdes,specrequest,cult,reptstatus)   ) Recent Results (from the past 720 hour(s))  Culture, blood (routine x 2)     Status: None (Preliminary result)   Collection Time: 06/30/2014  8:45 PM  Result Value Ref Range Status   Specimen Description BLOOD RIGHT FOREARM  Final   Special Requests BOTTLES DRAWN AEROBIC AND ANAEROBIC 5 CC  Final   Culture  Setup Time   Final    06/14/2014 01:25 Performed at [facility]    Culture   Final    GRAM NEGATIVE RODS Note: Gram Stain Report Called to,Read Back By and Verified With: Glenford Peers RN on 06/15/14 at 22:25 by Rise Mu Performed at [facility]    Report Status PENDING  Incomplete  Urine culture     Status: None   Collection Time: 06/08/2014  9:01 PM  Result Value Ref Range Status   Specimen Description URINE, RANDOM  Final   Special Requests NONE  Final   Culture  Setup Time   Final    06/14/2014 03:40 Performed at [place]   Final    >=100,000 COLONIES/ML Performed at [facility]    Culture   Final    Multiple bacterial morphotypes present, none predominant. Suggest appropriate recollection if clinically indicated. Performed at [facility]    Report Status 06/16/2014 FINAL  Final  Culture, blood (routine x 2)     Status: None (Preliminary result)   Collection Time: 06/15/2014  9:08 PM  Result Value Ref Range Status   Specimen Description BLOOD LEFT HAND  Final   Special Requests BOTTLES DRAWN AEROBIC ONLY 5 CC  Final    Culture  Setup Time   Final    06/14/2014 01:25 Performed at [facility]  Culture   Final           BLOOD CULTURE RECEIVED NO GROWTH TO DATE CULTURE WILL BE HELD FOR 5 DAYS BEFORE ISSUING A FINAL NEGATIVE REPORT Performed at [facility]    Report Status PENDING  Incomplete  MRSA PCR Screening     Status: None   Collection Time: 06/14/14  6:45 AM  Result Value Ref Range Status   MRSA by PCR NEGATIVE NEGATIVE Final    Comment:        The GeneXpert MRSA Assay (FDA approved for NASAL specimens only), is one component of a comprehensive MRSA colonization surveillance program. It is not intended to diagnose MRSA infection nor to guide or monitor treatment for MRSA infections.      Impression/Recommendation  Principal Problem:   Sepsis Active Problems:   Hypothyroidism   Iron deficiency anemia   Severe aortic stenosis   Atrial fibrillation   MITRAL VALVE REPLACEMENT, HX OF   PACEMAKER-[employer]   PVD (peripheral vascular disease)   COPD (chronic obstructive pulmonary disease)   Protein-calorie malnutrition, severe   HCAP (healthcare-associated pneumonia)   AKI (acute kidney injury)   Atrial fibrillation with RVR   Sadee Osland is a 80 y.o. female with  Gram negative bacteremia and septic shock with hx of pacemaker, AF, CAD, COPD also with supratherapeutic INR, right sided hydronephrosis  #1 Gram negative bacteremia:  I will broaden her with IMIPENEM given her extensive hx of UTI rx  DC vancomycin  Repeat cultures  Micro lab cannot ID organism yet as cultures young and organisms were small colonies  Hopefully this is ONLY a local infection without PM infection  #2 HCAP: I think she has more pleural effusions with atelectasis than PNA   06/16/2014, 3:22 PM   Thank you so much for this interesting consult  [facility] (pager) [phone] (office) 06/16/2014, 3:22 PM  Rhina [place] 06/16/2014, 3:22 PM

## 2014-06-16 NOTE — Progress Notes (Signed)
ANTIBIOTIC CONSULT NOTE - INITIAL  Pharmacy Consult for Primaxin Indication: Gram Neg Rods in blood  Allergies  Allergen Reactions  . Donepezil Hydrochloride Palpitations and Other (See Comments)    REACTION: heart fluttering  . Oxycodone-Acetaminophen Other (See Comments)    REACTION: Hallucinations  . Morphine Sulfate Other (See Comments)    unknown    Patient Measurements: Height: 4\' 11"  (149.9 cm) Weight: 71 lb 3.3 oz (32.3 kg) IBW/kg (Calculated) : 43.2   Vital Signs: Temp: 97.4 F (36.3 C) (11/09 0718) Temp Source: Oral (11/09 0718) BP: 150/61 mmHg (11/09 1000) Pulse Rate: 89 (11/09 1000) Intake/Output from previous day: 11/08 0701 - 11/09 0700 In: 230 [P.O.:60; I.V.:120; IV Piggyback:50] Out: -  Intake/Output from this shift: Total I/O In: 5 [I.V.:5] Out: -   Labs:  Recent Labs  06/14/14 0530 06/15/14 0952 06/16/14 0306  WBC 15.8* 20.5* 25.3*  HGB 9.5* 9.3* 9.3*  PLT 218 171 185  CREATININE 1.78* 1.39* 1.24*   Estimated Creatinine Clearance: 17.8 mL/min (by C-G formula based on Cr of 1.24). No results for input(s): VANCOTROUGH, VANCOPEAK, VANCORANDOM, GENTTROUGH, GENTPEAK, GENTRANDOM, TOBRATROUGH, TOBRAPEAK, TOBRARND, AMIKACINPEAK, AMIKACINTROU, AMIKACIN in the last 72 hours.   Microbiology: Recent Results (from the past 720 hour(s))  Culture, blood (routine x 2)     Status: None (Preliminary result)   Collection Time: 07/01/2014  8:45 PM  Result Value Ref Range Status   Specimen Description BLOOD RIGHT FOREARM  Final   Special Requests BOTTLES DRAWN AEROBIC AND ANAEROBIC 5 CC  Final   Culture  Setup Time   Final    06/14/2014 01:25 Performed at [facility]    Culture   Final    GRAM NEGATIVE RODS Note: Gram Stain Report Called to,Read Back By and Verified With: Glenford Peers RN on 06/15/14 at 22:25 by Rise Mu Performed at [facility]    Report Status PENDING  Incomplete  Urine culture     Status: None   Collection Time:  07/06/2014  9:01 PM  Result Value Ref Range Status   Specimen Description URINE, RANDOM  Final   Special Requests NONE  Final   Culture  Setup Time   Final    06/14/2014 03:40 Performed at [facility]   Final    >=100,000 COLONIES/ML Performed at [facility]    Culture   Final    Multiple bacterial morphotypes present, none predominant. Suggest appropriate recollection if clinically indicated. Performed at [facility]    Report Status 06/16/2014 FINAL  Final  Culture, blood (routine x 2)     Status: None (Preliminary result)   Collection Time: 07/06/2014  9:08 PM  Result Value Ref Range Status   Specimen Description BLOOD LEFT HAND  Final   Special Requests BOTTLES DRAWN AEROBIC ONLY 5 CC  Final   Culture  Setup Time   Final    06/14/2014 01:25 Performed at [facility]    Culture   Final           BLOOD CULTURE RECEIVED NO GROWTH TO DATE CULTURE WILL BE HELD FOR 5 DAYS BEFORE ISSUING A FINAL NEGATIVE REPORT Performed at [facility]    Report Status PENDING  Incomplete  MRSA PCR Screening     Status: None   Collection Time: 06/14/14  6:45 AM  Result Value Ref Range Status   MRSA by PCR NEGATIVE NEGATIVE Final    Comment:  The GeneXpert MRSA Assay (FDA approved for NASAL specimens only), is one component of a comprehensive MRSA colonization surveillance program. It is not intended to diagnose MRSA infection nor to guide or monitor treatment for MRSA infections.     Medical History: Past Medical History  Diagnosis Date  . CAD (coronary artery disease)   . History of CVA (cerebrovascular accident)   . Atrial fibrillation     peristent  . Hyperlipemia   . COPD (chronic obstructive pulmonary disease)   . Epistaxis     recurrent  . Osteoporosis   . Hypothyroidism   . History of pulmonary embolism   . History of colonic diverticulitis   . History of breast cancer   . Asthma   . Iron deficiency  anemia   . History of transfusion of whole blood   . LBBB (left bundle branch block)     history of  . Aortal stenosis     severe  . Pacemaker     bradycardia s/p pacemaker implant  . PAD (peripheral artery disease)     left leg occluded viabahn stents x 3 left SFA  . Tachycardia-bradycardia syndrome     s/p PPM  . Cancer   . Arthritis   . Stroke   . HTN (hypertension) 09/25/2012  . Esophageal reflux 01/26/2014  . Left-sided visual neglect 03/03/2014    Medications:  Prescriptions prior to admission  Medication Sig Dispense Refill Last Dose  . acetaminophen (TYLENOL) 650 MG CR tablet Take 650 mg by mouth as directed. TAKE BY MOUTH EVERY 4 HOURS PRN ---- FEVER   unknown prn  . albuterol (PROVENTIL HFA;VENTOLIN HFA) 108 (90 BASE) MCG/ACT inhaler Inhale 2 puffs into the lungs every 6 (six) hours as needed for wheezing or shortness of breath. 1 Inhaler 3 unknown prn  . albuterol (PROVENTIL) (2.5 MG/3ML) 0.083% nebulizer solution Take 2.5 mg by nebulization every 4 (four) hours as needed for wheezing or shortness of breath.   unknown prn  . Amino Acids-Protein Hydrolys (FEEDING SUPPLEMENT, PRO-STAT SUGAR FREE 64,) LIQD Take 30 mLs by mouth daily.   07/06/2014 at Unknown time  . budesonide (PULMICORT) 1 MG/2ML nebulizer solution Take 2 mLs (1 mg total) by nebulization 2 (two) times daily. 60 mL 1 07/05/2014 at Unknown time  . budesonide (PULMICORT) 180 MCG/ACT inhaler Inhale 1 puff into the lungs 2 (two) times daily.   06/11/2014 at Unknown time  . chlorhexidine (PERIDEX) 0.12 % solution 15 mLs by Mouth Rinse route 2 (two) times daily. 120 mL 0 07/01/2014 at Unknown time  . docusate sodium (COLACE) 100 MG capsule Take 100 mg by mouth 2 (two) times daily.   06/08/2014 at Unknown time  . LEE SALINE NASAL SPRAY NA Place into the nose. 0.65%--- USE 1 (ONE) SPRAY EACH NOSTRIL TWICE DAILY   06/25/2014 at Unknown time  . levothyroxine (SYNTHROID, LEVOTHROID) 75 MCG tablet Take 75 mcg by mouth daily before  breakfast.   07/01/2014 at Unknown time  . Melatonin 5 MG TABS Take 5 mg by mouth at bedtime.   06/12/2014 at Unknown time  . memantine (NAMENDA) 10 MG tablet Take 10 mg by mouth 2 (two) times daily.    06/12/2014 at Unknown time  . Multiple Vitamin (MULTIVITAMIN) tablet Take 1 tablet by mouth daily. WITH MINERALS---PO QD--- WOUND   06/24/2014 at Unknown time  . NON FORMULARY OXYGEN   07/03/2014 at Unknown time  . pravastatin (PRAVACHOL) 20 MG tablet Take 20 mg by mouth at bedtime.  06/12/2014 at Unknown time  . ranitidine (ZANTAC) 300 MG tablet Take 300 mg by mouth at bedtime.   06/12/2014 at Unknown time  . Rivastigmine (EXELON) 13.3 MG/24HR PT24 Place 1 patch onto the skin daily.   06/12/2014 at Unknown time  . warfarin (COUMADIN) 5 MG tablet Take 2.5-5 mg by mouth daily at 6 PM. Takes 5mg  on Tues, Thurs and Sat Takes 2.5mg  all other days   06/21/2014 at Unknown time  . omeprazole (PRILOSEC) 20 MG capsule Take 20 mg by mouth daily.   not started yet   Assessment: 80 y.o female on vancomycin and cefepime for PNA, blood cultures positive for GNR. Antibiotic regimen changed per ID to Primaxin. Await c/s for follow up.Renal function is improving. Crcl 71mls/min.   Goal of Therapy:  resolution of bactermia  Plan:  Monitor patient for GNR ID and possible deescalation.  F/u renal function Imipenem initiated at 250mg  IV q12h  Isac Sarna, BS Pharm D, BCPS Clinical Pharmacist  06/16/2014,10:22 AM

## 2014-06-16 NOTE — Progress Notes (Signed)
Patient ID: Rita Miller, female   DOB: 14-Nov-1929, 80 y.o.   MRN: [id number]

## 2014-06-16 NOTE — Clinical Documentation Improvement (Signed)
Nursing documented sacral pressure ulcer on 06/14/14 04:30.  Registered Dietician note also reflects presence of sacral pressure ulcer.  If you are in agreement with nursing and dietician notes, please document in your progress note and carry over to the discharge summary.    Possible Clinical Conditions: -Sacral pressure ulcer stage II (also indicate if present on admission) -Sacral pressure ulcer - other stage (please specify and indicate if present on admission) -Other condition  Thank you, Mateo Flow, RN [phone] Clinical Documentation Specialist

## 2014-06-16 NOTE — Progress Notes (Addendum)
Patient ID: Rita Miller  female  DGL:[id number]    DOB: 03-Oct-1929    DOA: 06/10/2014  PCP: Penni Homans, MD  Addendum  Reviewed CT results with radiology, Dr Pascal Lux, right hydronephrosis with obstruction at UPJ, 9x4x14cm renal abscess. Small pleural effusion with possible reactive effusion, LLLobe collapse from poss aspiration. - d/w Dr Jeffie Pollock (Urology), who reviewed the imaging, rec'ed perc drain with +/- stent in am, will aggressive bring INR down tonight. - Placed IR consult, stat PT/INR, if still supratherapeutic >2, will give FFP (I ordered 2 units it in advance). Once FFP done, will need lasix after FFP to avoid fluid overload. - Dr Jeffie Pollock will notify Dr Alinda Money. - D/w nurse   Rita Miller M.D. [facility] 06/16/2014, 6:02 PM  Pager: [phone]      Brief history of present illness Patient is a 80 year old female with history of CAD, CVA, A. Fib, COPD, hypothyroidism, on chronic", history of PE, severe aortic stenosis, mitral valve repair, chronic diastolic CHF, pacemaker, presented with shortness of breath, fever from nursing home. Patient was found to have fever of 104, also had mentioned abdominal pain and dyspnea. Chest x-ray showed bilateral pleural effusion with basilar atelectasis or infiltrates. CT of the abdomen and pelvis showed bilateral pleural effusions with basilar atelectasis or consolidation bilaterally, marked hydronephrosis of the right kidney, etiology not determined. Patient was admitted due to SIRS, HCAP, CHF exacerbation ( BNP 29,340), UTI also found to be in atrial fib with RVR and was treated with Cardizem. INR supratherapeutic at 6.1 on admission, now >10 consistently despite vitamin K x2. 11/9: blood cultures positive for gram-negative rods, urine culture showed multiple bacterial morphotypes,pro-calcitonin 16.5, KUB negative for any small bowel obstruction. ID consulted Urology following for marked hydronephrosis of the right  kidney  Assessment/Plan: Principal Problem:   Sepsis from HCAP (healthcare-associated pneumonia), UTI, acute respiratory failure, gram-negative rods bacteremia:  - KUB negative for any obstruction, will repeat CT abdomen, given significant abdominal pain today - blood cultures positive for gram-negative rods, urine culture showed multiple bacterial morphotypes - repeat blood cultures, urine cultures, discussed with ID, Dr. Tommy Medal, recommended ertapenem and discontinue vancomycin - leukocytosis worsening however patient also on steroids, no wheezing hence I am discontinuing steroids today  Active Problems: Bilateral pleural effusions with acute on chronic CHF/diastolic exacerbation - likely precipitated due to sepsis/pneumonia, creatinine 2.0 at the time of arrival - 2-D echo in 7/15 showed EF of 55%, moderate LVH, very severe aortic stenosis - cardiology following  AK I: resolved  Right-sided marked hydronephrosis with abdominal pain: has history of frequent UTIs  - urology consulted, per Dr. Alinda Money, may be chronic due to congenital right UPJ obstruction however due to multiple severe medical co-morbidities and age, difficult situation for ureteral stenting or nephrostomy drainage.  Supratherapeutic INR :  - INR still above 10 despite vitamin K 2, will give vitamin K 1 again -   Hypothyroidism - continue Synthroid    Severe aortic stenosis - Cardiology consulted, likely not a candidate of aortic valve repair given her multiple medical problems, age and comorbidities    Atrial fibrillation with RVR - continue Cardizem, BP borderline    MITRAL VALVE REPLACEMENT, HX OF, pacemaker - On chronic anticoagulation, INR still supratherapeutic    COPD (chronic obstructive pulmonary disease): wheezing improved - placed on Xopenex and ipratropium nebs, discontinue Solu-Medrol, continue Pulmicort    Protein-calorie malnutrition, severe - Nutrition consult once able to tolerate  diet  Dementia - Continue Namenda   DVT  Prophylaxis: on Coumadin  Code Status:DO NOT RESUSCITATE  Family Communication: called Patient's daughter, Rita Miller and updated in detail. She confirmed that patient is DO NOT RESUSCITATE, does not want any artificial feedings.   Disposition:  Consultants:  Cardiology  Urology  Procedures:  None  Antibiotics:  IV vancomycin  IV cefepime  Subjective: Patient seen and examined, complaining of right-sided abdominal pain, nausea. Temp hypothermia, no wheezing  Objective: Weight change:   Intake/Output Summary (Last 24 hours) at 06/16/14 0941 Last data filed at 06/16/14 0800  Gross per 24 hour  Intake    225 ml  Output      0 ml  Net    225 ml   Blood pressure 143/54, pulse 93, temperature 97.4 F (36.3 C), temperature source Oral, resp. rate 34, height 4\' 11"  (1.499 m), weight 32.3 kg (71 lb 3.3 oz), SpO2 92 %.  Physical Exam: General: Alert and awake, oriented  CVS: S1-S2 clear,  3/6 SEM Chest:decreased breath at the bases, no wheezing Abdomen: soft , tender in the right flank area,  Extremities: no cyanosis, clubbing or edema noted bilaterally   Lab Results: Basic Metabolic Panel:  Recent Labs Lab 06/15/14 0952 06/16/14 0306  NA 138 141  K 4.4 4.1  CL 106 110  CO2 14* 14*  GLUCOSE 181* 197*  BUN 92* 97*  CREATININE 1.39* 1.24*  CALCIUM 8.2* 8.2*   Liver Function Tests:  Recent Labs Lab 06/19/2014 2108 06/14/14 0530  AST 56* 43*  ALT 18 17  ALKPHOS 113 108  BILITOT 0.6 0.9  PROT 7.0 6.5  ALBUMIN 2.1* 1.9*    Recent Labs Lab 06/22/2014 2108 06/14/14 0530  LIPASE 20 14   No results for input(s): AMMONIA in the last 168 hours. CBC:  Recent Labs Lab 06/14/14 0530 06/15/14 0952 06/16/14 0306  WBC 15.8* 20.5* 25.3*  NEUTROABS 14.6*  --   --   HGB 9.5* 9.3* 9.3*  HCT 28.5* 27.5* 27.3*  MCV 92.5 89.0 88.1  PLT 218 171 185   Cardiac Enzymes:  Recent Labs Lab 06/08/2014 2108   TROPONINI <0.30   BNP: Invalid input(s): POCBNP CBG: No results for input(s): GLUCAP in the last 168 hours.   Micro Results: Recent Results (from the past 240 hour(s))  Culture, blood (routine x 2)     Status: None (Preliminary result)   Collection Time: 06/29/2014  8:45 PM  Result Value Ref Range Status   Specimen Description BLOOD RIGHT FOREARM  Final   Special Requests BOTTLES DRAWN AEROBIC AND ANAEROBIC 5 CC  Final   Culture  Setup Time   Final    06/14/2014 01:25 Performed at [place]    Culture   Final    GRAM NEGATIVE RODS Note: Gram Stain Report Called to,Read Back By and Verified With: Glenford Peers RN on 06/15/14 at 22:25 by Rise Mu Performed at [facility]    Report Status PENDING  Incomplete  Urine culture     Status: None   Collection Time: 06/21/2014  9:01 PM  Result Value Ref Range Status   Specimen Description URINE, RANDOM  Final   Special Requests NONE  Final   Culture  Setup Time   Final    06/14/2014 03:40 Performed at [place]   Final    >=100,000 COLONIES/ML Performed at [facility]    Culture   Final    Multiple bacterial morphotypes present, none predominant. Suggest appropriate recollection if  clinically indicated. Performed at [facility]    Report Status 06/16/2014 FINAL  Final  Culture, blood (routine x 2)     Status: None (Preliminary result)   Collection Time: 06/29/2014  9:08 PM  Result Value Ref Range Status   Specimen Description BLOOD LEFT HAND  Final   Special Requests BOTTLES DRAWN AEROBIC ONLY 5 CC  Final   Culture  Setup Time   Final    06/14/2014 01:25 Performed at [facility]    Culture   Final           BLOOD CULTURE RECEIVED NO GROWTH TO DATE CULTURE WILL BE HELD FOR 5 DAYS BEFORE ISSUING A FINAL NEGATIVE REPORT Performed at [facility]    Report Status PENDING  Incomplete  MRSA PCR Screening     Status: None   Collection Time: 06/14/14   6:45 AM  Result Value Ref Range Status   MRSA by PCR NEGATIVE NEGATIVE Final    Comment:        The GeneXpert MRSA Assay (FDA approved for NASAL specimens only), is one component of a comprehensive MRSA colonization surveillance program. It is not intended to diagnose MRSA infection nor to guide or monitor treatment for MRSA infections.     Studies/Results: Ct Abdomen Pelvis Wo Contrast  06/14/2014   CLINICAL DATA:  Fever and shortness of breath.  Abdominal pain.  EXAM: CT ABDOMEN AND PELVIS WITHOUT CONTRAST  TECHNIQUE: Multidetector CT imaging of the abdomen and pelvis was performed following the standard protocol without IV contrast.  COMPARISON:  01/20/2014  FINDINGS: Small bilateral pleural effusions with basilar atelectasis or consolidation. Extensive vascular calcifications in the Chest involving aorta and Coronary arteries. Cardiac pacemaker.  Unenhanced appearance of the liver, spleen, gallbladder, pancreas, adrenal glands, inferior vena cava, and retroperitoneal lymph nodes is unremarkable except for vascular calcifications. Since the previous study, there is new development of fluid around the liver and right kidney suggesting ascites or reactive edema. There is marked right renal hydronephrosis without definite pyelocaliectasis. Ureteral obstruction is suspected but causes not confirmed on this study. Visualization of the low pelvis and distal ureter is limited due to streak artifact from bilateral hip hardware. Multiple parenchymal lesions in both kidneys probably representing small cysts although too small to characterize. These were present previously. There is extensive calcification of the aorta and iliac arteries. Abdominal aortic aneurysm measuring 3.8 cm maximal AP diameter. 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 This is similar to previous study. Calcification in the internal and external iliac arteries  bilaterally likely represent significant stenosis. Stomach and small bowel do not appear abnormally distended. Diffusely stool-filled colon likely indicating constipation. No free air in the abdomen.  Pelvis: Limited visualization due to surgical hardware in the hips. Visualized bladder wall is not thickened. Prominent stool in the rectosigmoid colon likely indicating constipation. Colonic diverticula are present. Appendix is not identified. Suggestion of focal fluid collection in the right pelvis of nonspecific etiology. This measures about 2.9 cm diameter. This could represent ovarian cyst. Uterus is not identified and may be surgically absent or atrophic. Calcified granulomas in the subcutaneous fat over the gluteal regions consistent with injection granulomas. Diffuse bone demineralization. Endplate compression fractures at L1. No change in appearance of the bones since the previous study.  IMPRESSION: Small bilateral pleural effusions with basilar atelectasis or consolidation bilaterally. New free fluid in the right upper quadrant which may indicate  ascites or reactive fluid. Marked hydronephrosis of the right kidney. Etiology is not determined. Abdominal aortic aneurysm with additional eccentric saccular aneurysm. No change since prior study. Extensive vascular calcifications with likely vascular compromise in the iliac arteries bilaterally. Small fluid collection in the right pelvis of nonspecific etiology.   Electronically Signed   By: Lucienne Capers M.D.   On: 06/14/2014 01:18   Dg Chest Port 1 View  06/19/2014   CLINICAL DATA:  Shortness of breath and fever for 1 day  EXAM: PORTABLE CHEST - 1 VIEW  COMPARISON:  03/07/2014  FINDINGS: Cardiac pacemaker. Postoperative changes in the right breast and right axilla. Mild cardiac enlargement. Shallow inspiration with infiltration or atelectasis in the lung bases. This is increasing since previous study. No pneumothorax. Calcified and tortuous aorta with  prominent pulmonary outflow tract.  IMPRESSION: Bilateral pleural effusions with basilar atelectasis or infiltration, increasing since previous study. Mild cardiac enlargement with prominent pulmonary outflow tract   Electronically Signed   By: Lucienne Capers M.D.   On: 06/11/2014 21:22   Dg Abd Portable 1v  06/16/2014   CLINICAL DATA:  Abdominal pain, fever and elevated white blood cell count.  EXAM: PORTABLE ABDOMEN - 1 VIEW  COMPARISON:  03/05/2014 and CT on 06/14/2014.  FINDINGS: No bowel obstruction identified. There is moderate fecal material in the distal colon and rectum. Vascular calcifications are seen involving the abdominal aorta and iliac arteries with indwelling left iliac arterial stent present.  IMPRESSION: No evidence of bowel obstruction.   Electronically Signed   By: Aletta Edouard M.D.   On: 06/16/2014 08:21    Medications: Scheduled Meds: . antiseptic oral rinse  7 mL Mouth Rinse q12n4p  . budesonide (PULMICORT) nebulizer solution  0.5 mg Nebulization BID  . chlorhexidine  15 mL Mouth Rinse BID  . docusate sodium  100 mg Oral BID  . guaiFENesin  600 mg Oral BID  . insulin aspart  0-5 Units Subcutaneous QHS  . insulin aspart  0-9 Units Subcutaneous TID WC  . levalbuterol  0.63 mg Nebulization QID   And  . ipratropium  0.5 mg Nebulization QID  . levothyroxine  75 mcg Oral QAC breakfast  . memantine  10 mg Oral BID  . methylPREDNISolone (SOLU-MEDROL) injection  40 mg Intravenous Q12H  . pantoprazole  40 mg Oral Daily  . phytonadione (VITAMIN K) IV  10 mg Intravenous Once  . pravastatin  20 mg Oral QHS  . Rivastigmine  1 patch Transdermal Daily      LOS: 3 days   Rita Miller M.D. [facility] 06/16/2014, 9:41 AM Pager: [phone]  If 7PM-7AM, please contact night-coverage [email or web address] Password [email or web address]

## 2014-06-16 NOTE — Progress Notes (Signed)
INITIAL NUTRITION ASSESSMENT  DOCUMENTATION CODES Per approved criteria  -Severe malnutrition in the context of chronic illness   Pt meets criteria for severe MALNUTRITION in the context of chronic illness as evidenced by 28% wt loss x 6 months, severe fat and muscle depletion.  INTERVENTION: -30 ml Prostat TID -Magic Cup TID with meals  NUTRITION DIAGNOSIS: Malnutrition related to dysphagia, decreased PO intake as evidenced by severe fat and muscle depletion, 28% wt loss x 6 months.   Goal: Pt will meet >90% of estimated nutritional needs  Monitor:  PO/supplement intake, labs, weight changes, I/O's  Reason for Assessment: MST=5  80 y.o. female  Admitting Dx: Sepsis  Patient is a 80 year old female with history of CAD, CVA, A. Fib, COPD, hypothyroidism, on chronic", history of PE, severe aortic stenosis, mitral valve repair, chronic diastolic CHF, pacemaker, presented with shortness of breath, fever from nursing home.   ASSESSMENT: Pt admitted from [facility] for sepsis from HCAP. Pt unable to participate in interview. Pt with overt signs of dysphagia and aspiration per speech therapy. She has been ordered a dysphagia 1 diet with nectar thick liquids.  Intake has been very poor since admission; documented meal completion 0-30%. Noted minimal completion of lunch tray this afternoon during visit.  Suspect prolonged poor po intake PTA. Noted progressive wt loss over the past year, including a 27.6% wt loss x 6 months. Per MD, family does not desire artifical nutrition and hydration.  Labs reviewed. CO2: 14, BUN/Creat: 97/1.24, Calcium: 8.2, Glucose: 197, CBGS: 166-202.   Nutrition Focused Physical Exam:  Subcutaneous Fat:  Orbital Region: severe depletion Upper Arm Region: severe depletion Thoracic and Lumbar Region: severe depletion  Muscle:  Temple Region: severe depletion Clavicle Bone Region: severe depletion Clavicle and Acromion Bone Region: severe  depletion Scapular Bone Region: severe depletion Dorsal Hand: severe depletion Patellar Region: severe depletion Anterior Thigh Region: severe depletion Posterior Calf Region: severe depletion  Edema: none present  Height: Ht Readings from Last 1 Encounters:  06/14/14 4\' 11"  (1.499 m)    Weight: Wt Readings from Last 1 Encounters:  06/14/14 71 lb 3.3 oz (32.3 kg)    Ideal Body Weight: 98#  % Ideal Body Weight: 72%  Wt Readings from Last 20 Encounters:  06/14/14 71 lb 3.3 oz (32.3 kg)  03/05/14 90 lb 2.7 oz (40.9 kg)  02/28/14 91 lb (41.277 kg)  02/26/14 93 lb 1.3 oz (42.221 kg)  02/13/14 93 lb 4 oz (42.298 kg)  01/17/14 95 lb (43.092 kg)  01/07/14 98 lb 1.7 oz (44.5 kg)  01/03/14 98 lb (44.453 kg)  12/27/13 98 lb (44.453 kg)  12/19/13 98 lb 8 oz (44.679 kg)  11/26/13 102 lb 1.9 oz (46.321 kg)  11/14/13 97 lb (43.999 kg)  08/30/13 105 lb (47.628 kg)  08/02/13 105 lb 0.6 oz (47.646 kg)  05/09/13 103 lb (46.72 kg)  05/01/13 103 lb 1.9 oz (46.775 kg)  04/04/13 104 lb (47.174 kg)  03/12/13 103 lb (46.72 kg)  01/30/13 104 lb (47.174 kg)  12/21/12 100 lb (45.36 kg)   Usual Body Weight: 100#  % Usual Body Weight: 71%  BMI:  Body mass index is 14.37 kg/(m^2). Underweight  Estimated Nutritional Needs: Kcal: 1100-1300 Protein: 42-52 grams Fluid: 1.1-1.3 L  Skin: stage II pressure ulcer on mid sacrum  Diet Order: DIET - DYS 1 with nectar thick liquids  EDUCATION NEEDS: -Education not appropriate at this time   Intake/Output Summary (Last 24 hours) at 06/16/14 1255 Last  data filed at 06/16/14 1000  Gross per 24 hour  Intake    370 ml  Output      0 ml  Net    370 ml    Last BM: PTA   Labs:   Recent Labs Lab 06/14/14 0530 06/15/14 0952 06/16/14 0306  NA 131* 138 141  K 3.9 4.4 4.1  CL 98 106 110  CO2 16* 14* 14*  BUN 84* 92* 97*  CREATININE 1.78* 1.39* 1.24*  CALCIUM 7.9* 8.2* 8.2*  GLUCOSE 99 181* 197*    CBG (last 3)   Recent Labs   06/16/14 0814 06/16/14 1219  GLUCAP 166* 202*    Scheduled Meds: . antiseptic oral rinse  7 mL Mouth Rinse q12n4p  . budesonide (PULMICORT) nebulizer solution  0.5 mg Nebulization BID  . chlorhexidine  15 mL Mouth Rinse BID  . docusate sodium  100 mg Oral BID  . guaiFENesin  600 mg Oral BID  . imipenem-cilastatin  250 mg Intravenous Q12H  . insulin aspart  0-5 Units Subcutaneous QHS  . insulin aspart  0-9 Units Subcutaneous TID WC  . levalbuterol  0.63 mg Nebulization QID   And  . ipratropium  0.5 mg Nebulization QID  . levothyroxine  75 mcg Oral QAC breakfast  . memantine  10 mg Oral BID  . pantoprazole  40 mg Oral Daily  . pravastatin  20 mg Oral QHS  . Rivastigmine  1 patch Transdermal Daily    Continuous Infusions: . diltiazem (CARDIZEM) infusion 5 mg/hr (06/15/14 1735)    Past Medical History  Diagnosis Date  . CAD (coronary artery disease)   . History of CVA (cerebrovascular accident)   . Atrial fibrillation     peristent  . Hyperlipemia   . COPD (chronic obstructive pulmonary disease)   . Epistaxis     recurrent  . Osteoporosis   . Hypothyroidism   . History of pulmonary embolism   . History of colonic diverticulitis   . History of breast cancer   . Asthma   . Iron deficiency anemia   . History of transfusion of whole blood   . LBBB (left bundle branch block)     history of  . Aortal stenosis     severe  . Pacemaker     bradycardia s/p pacemaker implant  . PAD (peripheral artery disease)     left leg occluded viabahn stents x 3 left SFA  . Tachycardia-bradycardia syndrome     s/p PPM  . Cancer   . Arthritis   . Stroke   . HTN (hypertension) 09/25/2012  . Esophageal reflux 01/26/2014  . Left-sided visual neglect 03/03/2014    Past Surgical History  Procedure Laterality Date  . Thyroidectomy      due to cancer  . Total hip arthroplasty    . Abdominal hysterectomy    . Mitral valve repair    . Intraocular lens insertion      left eye implant   . Pacemaker placement      for tachy/brady syndrome  . Mastectomy      Acheron Sugg A. Jimmye Norman, RD, LDN Pager: [phone] After hours Pager: [phone]

## 2014-06-16 NOTE — Progress Notes (Signed)
SLP Cancellation Note  Patient Details Name: Rita Miller MRN: [id number] DOB: 17-Nov-1929   Cancelled treatment:       Reason Eval/Treat Not Completed: Patient at procedure or test/unavailable  Gabriel Rainwater [place], CCC-SLP [phone]  Gabriel Rainwater Meryl 06/16/2014, 4:29 PM

## 2014-06-16 NOTE — Telephone Encounter (Signed)
[facility]  Triage Call Report Triage Record Num: [id number] Operator: Merrilee Seashore Patient Name: Rita Miller Call Date & Time: 06/28/2014 5:20:48PM Patient Phone: [phone] PCP: Gwyneth Revels Patient Gender: Female PCP Fax : [phone] Patient DOB: [birth year] Practice Name: [facility]  Reason for Call: Caller: Betite/Lpn; PCP: Penni Homans (Family Practice); CB#: [phone]; Call regarding Need chest xray; c/o congestion 06/27/2014. Vitals 100/62. P80, R24, T 99.6. Triaged per Breathing Problems Guidelines. 911 dispostion for "Worsening breathing problem. " Daughter refuses 911 requesting MD on call be paged. Dr. Edilia Bo paged and aware. Advised Doxycycline 100 mg BID x 10 days, Prednisone 10mg  tab #3, QD x 7. LPN Betite aware. Orders faxed. Advised to call immediately if any new symptoms occur. Daughter agrees with plan. Orders faxed to facility.  Protocol(s) Used: Breathing Problems Recommended Outcome per Protocol: Activate EMS 911 Reason for Outcome: Worsening breathing problems AND known cardiac or respiratory condition (coronary artery disease, angina, heart failure, asthma, chronic bronchitis, COPD) NOT responding to treatment OR treatment not available.  Care Advice: ~

## 2014-06-17 DIAGNOSIS — L89152 Pressure ulcer of sacral region, stage 2: Secondary | ICD-10-CM

## 2014-06-17 DIAGNOSIS — R1031 Right lower quadrant pain: Secondary | ICD-10-CM

## 2014-06-17 DIAGNOSIS — A4150 Gram-negative sepsis, unspecified: Secondary | ICD-10-CM

## 2014-06-17 DIAGNOSIS — N1330 Unspecified hydronephrosis: Secondary | ICD-10-CM

## 2014-06-17 DIAGNOSIS — N390 Urinary tract infection, site not specified: Secondary | ICD-10-CM

## 2014-06-17 DIAGNOSIS — E41 Nutritional marasmus: Secondary | ICD-10-CM

## 2014-06-17 DIAGNOSIS — A498 Other bacterial infections of unspecified site: Secondary | ICD-10-CM | POA: Insufficient documentation

## 2014-06-17 DIAGNOSIS — R7881 Bacteremia: Secondary | ICD-10-CM

## 2014-06-17 DIAGNOSIS — N151 Renal and perinephric abscess: Secondary | ICD-10-CM

## 2014-06-17 DIAGNOSIS — J9601 Acute respiratory failure with hypoxia: Secondary | ICD-10-CM

## 2014-06-17 LAB — HEPARIN LEVEL (UNFRACTIONATED): Heparin Unfractionated: <0.1 IU/mL — ABNORMAL LOW (ref 0.30–0.70)

## 2014-06-17 LAB — CULTURE, BLOOD (ROUTINE X 2)

## 2014-06-17 LAB — CBC
HCT: 27 % — ABNORMAL LOW (ref 36.0–46.0)
Hemoglobin: 9.2 g/dL — ABNORMAL LOW (ref 12.0–15.0)
MCH: 30.6 pg (ref 26.0–34.0)
MCHC: 34.1 g/dL (ref 30.0–36.0)
MCV: 89.7 fL (ref 78.0–100.0)
Platelets: 144 10*3/uL — ABNORMAL LOW (ref 150–400)
RBC: 3.01 MIL/uL — ABNORMAL LOW (ref 3.87–5.11)
RDW: 15.8 % — AB (ref 11.5–15.5)
WBC: 26.6 10*3/uL — ABNORMAL HIGH (ref 4.0–10.5)

## 2014-06-17 LAB — BASIC METABOLIC PANEL
Anion gap: 18 — ABNORMAL HIGH (ref 5–15)
BUN: 101 mg/dL — AB (ref 6–23)
CALCIUM: 8.3 mg/dL — AB (ref 8.4–10.5)
CO2: 11 mEq/L — ABNORMAL LOW (ref 19–32)
Chloride: 114 mEq/L — ABNORMAL HIGH (ref 96–112)
Creatinine, Ser: 1.18 mg/dL — ABNORMAL HIGH (ref 0.50–1.10)
GFR, EST AFRICAN AMERICAN: 48 mL/min — AB (ref >90)
GFR, EST NON AFRICAN AMERICAN: 42 mL/min — AB (ref >90)
Glucose, Bld: 163 mg/dL — ABNORMAL HIGH (ref 70–99)
Potassium: 4.9 mEq/L (ref 3.7–5.3)
Sodium: 143 mEq/L (ref 137–147)

## 2014-06-17 LAB — LEGIONELLA ANTIGEN, URINE

## 2014-06-17 LAB — URINE CULTURE
COLONY COUNT: NO GROWTH
Culture: NO GROWTH

## 2014-06-17 LAB — PROTIME-INR
INR: 1.59 — ABNORMAL HIGH (ref 0.00–1.49)
Prothrombin Time: 19.1 seconds — ABNORMAL HIGH (ref 11.6–15.2)

## 2014-06-17 LAB — GLUCOSE, CAPILLARY
GLUCOSE-CAPILLARY: 144 mg/dL — AB (ref 70–99)
GLUCOSE-CAPILLARY: 143 mg/dL — AB (ref 70–99)
Glucose-Capillary: 168 mg/dL — ABNORMAL HIGH (ref 70–99)
Glucose-Capillary: 108 mg/dL — ABNORMAL HIGH (ref 70–99)

## 2014-06-17 MED ORDER — FENTANYL CITRATE 0.05 MG/ML IJ SOLN
50.0000 ug | Freq: Once | INTRAMUSCULAR | Status: AC
  Administered 2014-06-17: 50 ug via INTRAVENOUS
  Filled 2014-06-17: qty 2

## 2014-06-17 MED ORDER — HYDROMORPHONE HCL 1 MG/ML IJ SOLN
1.0000 mg | INTRAMUSCULAR | Status: DC | PRN
  Administered 2014-06-17: 2 mg via INTRAVENOUS
  Administered 2014-06-17 (×3): 1 mg via INTRAVENOUS
  Administered 2014-06-18: 2 mg via INTRAVENOUS
  Filled 2014-06-17 (×5): qty 2

## 2014-06-17 MED ORDER — FUROSEMIDE 10 MG/ML IJ SOLN
40.0000 mg | Freq: Every day | INTRAMUSCULAR | Status: DC
  Administered 2014-06-17 – 2014-06-18 (×2): 40 mg via INTRAVENOUS
  Filled 2014-06-17 (×7): qty 4

## 2014-06-17 MED ORDER — NITROGLYCERIN 2 % TD OINT
1.0000 [in_us] | TOPICAL_OINTMENT | Freq: Three times a day (TID) | TRANSDERMAL | Status: DC
Start: 2014-06-17 — End: 2014-06-17

## 2014-06-17 MED ORDER — SODIUM CHLORIDE 0.9 % IV SOLN: INTRAVENOUS | Status: DC | PRN

## 2014-06-17 MED ORDER — FUROSEMIDE 10 MG/ML IJ SOLN: 40.0000 mg | Freq: Once | INTRAMUSCULAR | Status: DC

## 2014-06-17 MED ORDER — HYDROMORPHONE HCL 1 MG/ML IJ SOLN: 0.5000 mg | Freq: Once | INTRAMUSCULAR | Status: DC

## 2014-06-17 MED ORDER — FUROSEMIDE 10 MG/ML IJ SOLN
20.0000 mg | INTRAMUSCULAR | Status: AC
  Administered 2014-06-17: 20 mg via INTRAVENOUS

## 2014-06-17 MED ORDER — LORAZEPAM 2 MG/ML IJ SOLN
1.0000 mg | INTRAMUSCULAR | Status: DC | PRN
  Administered 2014-06-17 – 2014-06-18 (×2): 1 mg via INTRAVENOUS
  Filled 2014-06-17 (×2): qty 1

## 2014-06-17 MED ORDER — HEPARIN (PORCINE) IN NACL 100-0.45 UNIT/ML-% IJ SOLN
750.0000 [IU]/h | INTRAMUSCULAR | Status: DC
  Filled 2014-06-17: qty 250

## 2014-06-17 MED ORDER — FENTANYL CITRATE 0.05 MG/ML IJ SOLN
25.0000 ug | INTRAMUSCULAR | Status: DC | PRN
  Administered 2014-06-17 – 2014-06-18 (×2): 50 ug via INTRAVENOUS
  Filled 2014-06-17 (×2): qty 2

## 2014-06-17 MED ORDER — LORAZEPAM 2 MG/ML IJ SOLN
1.0000 mg | Freq: Once | INTRAMUSCULAR | Status: AC
  Administered 2014-06-17: 1 mg via INTRAVENOUS
  Filled 2014-06-17: qty 1

## 2014-06-17 MED ORDER — SCOPOLAMINE 1 MG/3DAYS TD PT72
1.0000 | MEDICATED_PATCH | TRANSDERMAL | Status: DC
  Administered 2014-06-17: 1.5 mg via TRANSDERMAL
  Filled 2014-06-17 (×2): qty 1

## 2014-06-17 MED ORDER — METHYLPREDNISOLONE SODIUM SUCC 125 MG IJ SOLR
125.0000 mg | Freq: Once | INTRAMUSCULAR | Status: AC
  Administered 2014-06-17: 125 mg via INTRAVENOUS
  Filled 2014-06-17: qty 2

## 2014-06-17 MED ORDER — NITROGLYCERIN 2 % TD OINT
1.0000 [in_us] | TOPICAL_OINTMENT | Freq: Three times a day (TID) | TRANSDERMAL | Status: DC
  Administered 2014-06-17 – 2014-06-18 (×2): 1 [in_us] via TOPICAL
  Filled 2014-06-17: qty 30

## 2014-06-17 MED ORDER — SODIUM CHLORIDE 0.9 % IV SOLN: INTRAVENOUS | Status: DC

## 2014-06-17 MED ORDER — PANTOPRAZOLE SODIUM 40 MG IV SOLR
40.0000 mg | Freq: Two times a day (BID) | INTRAVENOUS | Status: DC
  Filled 2014-06-17 (×2): qty 40

## 2014-06-17 MED ORDER — FUROSEMIDE 10 MG/ML IJ SOLN
40.0000 mg | Freq: Once | INTRAMUSCULAR | Status: AC
Start: 2014-06-17 — End: 2014-06-17
  Administered 2014-06-17: 40 mg via INTRAVENOUS

## 2014-06-17 NOTE — Progress Notes (Signed)
SUBJECTIVE:  Pt seen and examined. Her respiratory status has deteriorated today and she is on IV diuresis and non-rebreather. She remains in persistent afib with stable blood pressure and heart rate.   OBJECTIVE:   Vitals:   Filed Vitals:   06/17/14 0845 06/17/14 0848 06/17/14 1134 06/17/14 1203  BP:    136/47  Pulse:    88  Temp:    97.7 F (36.5 C)  TempSrc:    Oral  Resp: 45   26  Height:      Weight:      SpO2:  93% 95% 94%   I&O's:   Intake/Output Summary (Last 24 hours) at 06/17/14 1219 Last data filed at 06/17/14 0900  Gross per 24 hour  Intake 682.73 ml  Output      0 ml  Net 682.73 ml   TELEMETRY: Reviewed telemetry pt in atrial fibrillation:   PHYSICAL EXAM General: Frail and cachetic appearing. Head:   Normal cephalic and atramatic  Lungs:  Tachypnea with increased WOB and accessory muscle use. On non-rebreather. Anterior  fields clear to ausculation.  Heart:  Irregularly irregular rhythm with normal HR. Grade III/VI late peaking harsh crescendo-decrescendo systolic murmur best heard at right upper sternal border.  Abdomen: Abdomen soft and non-tender Extremities:  Trace b/l edema.   Neuro: Alert, unable to assess due to respiratory status (currently on NRB)   Psych:  Unable to assess, dementia at baseline Skin: Multiple skin bruises   LABS: Basic Metabolic Panel:  Recent Labs  06/16/14 0306 06/17/14 0246  NA 141 143  K 4.1 4.9  CL 110 114*  CO2 14* 11*  GLUCOSE 197* 163*  BUN 97* 101*  CREATININE 1.24* 1.18*  CALCIUM 8.2* 8.3*   Liver Function Tests: No results for input(s): AST, ALT, ALKPHOS, BILITOT, PROT, ALBUMIN in the last 72 hours. No results for input(s): LIPASE, AMYLASE in the last 72 hours. CBC:  Recent Labs  06/16/14 0306 06/17/14 0246  WBC 25.3* 26.6*  HGB 9.3* 9.2*  HCT 27.3* 27.0*  MCV 88.1 89.7  PLT 185 144*   Cardiac Enzymes: No results for input(s): CKTOTAL, CKMB, CKMBINDEX, TROPONINI in the last 72  hours. BNP: Invalid input(s): POCBNP D-Dimer: No results for input(s): DDIMER in the last 72 hours. Hemoglobin A1C: No results for input(s): HGBA1C in the last 72 hours. Fasting Lipid Panel: No results for input(s): CHOL, HDL, LDLCALC, TRIG, CHOLHDL, LDLDIRECT in the last 72 hours. Thyroid Function Tests: No results for input(s): TSH, T4TOTAL, T3FREE, THYROIDAB in the last 72 hours.  Invalid input(s): FREET3 Anemia Panel: No results for input(s): VITAMINB12, FOLATE, FERRITIN, TIBC, IRON, RETICCTPCT in the last 72 hours. Coag Panel:   Lab Results  Component Value Date   INR 1.59* 06/17/2014   INR 1.60* 06/16/2014   INR 1.82* 06/16/2014    RADIOLOGY: Ct Abdomen Pelvis Wo Contrast  06/16/2014   CLINICAL DATA:  Abdominal pain.  Sepsis and bacteremia.  EXAM: CT ABDOMEN AND PELVIS WITHOUT CONTRAST  TECHNIQUE: Multidetector CT imaging of the abdomen and pelvis was performed following the standard protocol without IV contrast.  COMPARISON:  06/14/2014  FINDINGS: BODY WALL: Anasarca  LOWER CHEST: Small right pleural effusion which is increased from before. Scalloped appearance of the atelectatic right lower lobe suggest complexity, especially in light of neighboring subdiaphragmatic fluid collection. There is extensive debris within the left lower lobe bronchi, with the visualize left lower lobe collapse.  Cardiomegaly.  Extensive aortic valve sclerosis/calcification.  ABDOMEN/PELVIS:  Liver:  No focal abnormality.  Biliary: No evidence of biliary obstruction or stone.  Pancreas: Unremarkable.  Spleen: Unremarkable.  Adrenals: Unremarkable.  Kidneys and ureters: There is advanced hydronephrosis on the right with abrupt transition at the UPJ. Noted history of congenital UPJ obstruction and right hydronephrosis, but the degree of collecting system dilatation is significantly increased from 01/20/2014. No stone or other discrete obstructive process identified. There is a right posterior para renal fluid  collection which has a discrete rim inferiorly, with continuation superiorly to the subdiaphragmatic retroperitoneum. The collection measures 9 x 4 x 14 cm in maximal dimension. Left renal cysts again noted. Tiny high-density lesion in the upper pole left kidney is likely a hemorrhagic cyst. No left urinary obstruction.  Bladder: Moderate distention. Discrete fluid collection right of the bladder is a diverticulum with fluctuating size on recent imaging.  Reproductive: Unremarkable for age.  Bowel: Prominent colonic stool, especially in the rectum. There are no associated inflammatory changes. Severe distal colonic diverticulosis. No pericecal inflammatory changes.  Retroperitoneum: No mass or adenopathy.  Peritoneum: No ascites or pneumoperitoneum.  Vascular: Extensive atherosclerosis. There is a saccular aneurysm directed to the right of the upper abdominal aorta which is predominately thrombosed based on infused CT 01/20/2014. This peripherally calcified aneurysm measures 22 mm in diameter. Fusiform aneurysmal enlargement of the infrarenal aorta, up to 4.2 cm in diameter. No new displacement of intimal calcification or surrounding fat stranding to suggest rupture or leakage.  OSSEOUS: Total right hip arthroplasty. Status post left femoral screw. Visible portions of the hardware are unremarkable. Remote superior endplate fracture of L1.  These results were called by telephone at the time of interpretation on 06/16/2014 at 5:36 pm to Dr. Nira Conn RAI , who verbally acknowledged these results.  IMPRESSION: 1. Right hydronephrosis with obstruction at the UPJ, cause uncertain. There is a 9 x 4 x 14 cm right posterior pararenal fluid collection, likely abscess given the clinical circumstances. 2. Small but increasing right pleural effusion with complex features. This could represent empyema or reactive effusion. 3. Left lower lobe collapse. There is extensive endobronchial debris, question aspiration. 4. Right bladder  diverticulum. 5. Saccular and fusiform abdominal aortic aneurysms, as above.   Electronically Signed   By: Jorje Guild M.D.   On: 06/16/2014 17:39   Ct Abdomen Pelvis Wo Contrast  06/14/2014   CLINICAL DATA:  Fever and shortness of breath.  Abdominal pain.  EXAM: CT ABDOMEN AND PELVIS WITHOUT CONTRAST  TECHNIQUE: Multidetector CT imaging of the abdomen and pelvis was performed following the standard protocol without IV contrast.  COMPARISON:  01/20/2014  FINDINGS: Small bilateral pleural effusions with basilar atelectasis or consolidation. Extensive vascular calcifications in the Chest involving aorta and Coronary arteries. Cardiac pacemaker.  Unenhanced appearance of the liver, spleen, gallbladder, pancreas, adrenal glands, inferior vena cava, and retroperitoneal lymph nodes is unremarkable except for vascular calcifications. Since the previous study, there is new development of fluid around the liver and right kidney suggesting ascites or reactive edema. There is marked right renal hydronephrosis without definite pyelocaliectasis. Ureteral obstruction is suspected but causes not confirmed on this study. Visualization of the low pelvis and distal ureter is limited due to streak artifact from bilateral hip hardware. Multiple parenchymal lesions in both kidneys probably representing small cysts although too small to characterize. These were present previously. There is extensive calcification of the aorta and iliac arteries. Abdominal aortic aneurysm measuring 3.8 cm maximal AP diameter. Eccentric saccular aneurysm arising to the right of the upper abdominal  aorta and measuring 2.1 x 1.7 cm diameter. This is similar to previous study. Calcification in the internal and external iliac arteries bilaterally likely represent significant stenosis. Stomach and small bowel do not appear abnormally distended. Diffusely stool-filled colon likely indicating constipation. No free air in the abdomen.  Pelvis: Limited  visualization due to surgical hardware in the hips. Visualized bladder wall is not thickened. Prominent stool in the rectosigmoid colon likely indicating constipation. Colonic diverticula are present. Appendix is not identified. Suggestion of focal fluid collection in the right pelvis of nonspecific etiology. This measures about 2.9 cm diameter. This could represent ovarian cyst. Uterus is not identified and may be surgically absent or atrophic. Calcified granulomas in the subcutaneous fat over the gluteal regions consistent with injection granulomas. Diffuse bone demineralization. Endplate compression fractures at L1. No change in appearance of the bones since the previous study.  IMPRESSION: Small bilateral pleural effusions with basilar atelectasis or consolidation bilaterally. New free fluid in the right upper quadrant which may indicate ascites or reactive fluid. Marked hydronephrosis of the right kidney. Etiology is not determined. Abdominal aortic aneurysm with additional eccentric saccular aneurysm. No change since prior study. Extensive vascular calcifications with likely vascular compromise in the iliac arteries bilaterally. Small fluid collection in the right pelvis of nonspecific etiology.   Electronically Signed   By: Lucienne Capers M.D.   On: 06/14/2014 01:18   Dg Chest Port 1 View  06/19/2014   CLINICAL DATA:  Shortness of breath and fever for 1 day  EXAM: PORTABLE CHEST - 1 VIEW  COMPARISON:  03/07/2014  FINDINGS: Cardiac pacemaker. Postoperative changes in the right breast and right axilla. Mild cardiac enlargement. Shallow inspiration with infiltration or atelectasis in the lung bases. This is increasing since previous study. No pneumothorax. Calcified and tortuous aorta with prominent pulmonary outflow tract.  IMPRESSION: Bilateral pleural effusions with basilar atelectasis or infiltration, increasing since previous study. Mild cardiac enlargement with prominent pulmonary outflow tract    Electronically Signed   By: Lucienne Capers M.D.   On: 06/19/2014 21:22   Dg Abd Portable 1v  06/16/2014   CLINICAL DATA:  Abdominal pain, fever and elevated white blood cell count.  EXAM: PORTABLE ABDOMEN - 1 VIEW  COMPARISON:  03/05/2014 and CT on 06/14/2014.  FINDINGS: No bowel obstruction identified. There is moderate fecal material in the distal colon and rectum. Vascular calcifications are seen involving the abdominal aorta and iliac arteries with indwelling left iliac arterial stent present.  IMPRESSION: No evidence of bowel obstruction.   Electronically Signed   By: Aletta Edouard M.D.   On: 06/16/2014 08:21   Principal Problem:   Sepsis Active Problems:   Hypothyroidism   Iron deficiency anemia   Severe aortic stenosis   Atrial fibrillation   MITRAL VALVE REPLACEMENT, HX OF   PACEMAKER-[employer]   PVD (peripheral vascular disease)   COPD (chronic obstructive pulmonary disease)   Protein-calorie malnutrition, severe   HCAP (healthcare-associated pneumonia)   AKI (acute kidney injury)   Atrial fibrillation with RVR   Abdominal pain   Blood poisoning   Supratherapeutic INR   UTI (lower urinary tract infection)   Gram-negative bacteremia   Severe malnutrition   Sacral decubitus ulcer   Bacteroides fragilis infection   Renal abscess     ASSESSMENT: 80 yo woman with CAD, CVA, PE, Afib on coumadin, PVD, COPD, hypothyroidism, and dementia who presented with severe sepsis found to have GNR bacteremia due to large right retroperitoneum and paranephric abscess.  PLAN:    Long Standing Persistent Atrial Fibrillation -  Currently with stable blood pressure and HR. Continue IV diltiazem (consider bolus if becomes tachycardic) and IV heparin in setting of prior CVA and PE. Pt was at home on coumadin with supra therapeutic INR (>10) on admission that was reversed with vitamin K. Acute Hypoxemic Respiratory Failure in setting of b/l pleural effusions & COPD - Now requiring NIMV. On  IV  lasix 40 mg daily, consider increasing as blood pressure and renal function tolerates.  Bacteroides fragilis bacteremia in setting of large right retroperitoneum and paranephric abscess - Blood culture x 1 with bacteroides fragilis. Currently on IV primaxin per ID. Urology plan for IR percutaneous drainage of abscess and  percutaneous right nephrostomy tube placement.  Pt is high risk for surgery and has poor overall prognosis given her advanced age and multiple commodities including CVD and severe aortic stenosis. Pt is DNR and family open to palliative care.  Hypertension - Currently normotensive. On IV lasix 40 mg daily, caution with aggressive diuresis in setting of severe AS.  Severe Aortic Stenosis with prior MR s/p repair - Pt not surgical candidate. 2D-echo in July with EF 55%, very severe aortic stenosis with heavy calcification, and mild AI.  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 CAD -Last Myoview May 2010 with normal perfusion and EF 53%. Continue home pravastatin 20 mg daily.  AAA in setting of PVD  - CT in June 2015 with 4.1 x 3.7 cm abdominal aortic aneurysm. Pt is s/p left LE stenting.  History of PE and CVA - Currently on IV heparin. Pt on coumadin at home.  Hypothyroidism - Continue levothyroxine 75 mcg daily. Alzheimer's  Dementia - Pt on home rivastigmine and memantine .    Juluis Mire, MD  PGY-II IMTS Pager: [phone] 06/17/2014  12:19 PM  Attending note to follow.   I seen and examined the patient this morning along with Dr. Naaman Plummer on morning rounds. Together we reviewed the chart and all available data.  Briefly she is an 80 year old woman with known coronary disease, chronic A. Fib and severe aortic stenosis with moderate LVH and likely diastolic dysfunction associated with aortic valve disease. She has a severely dilated left atrium and is in chronic A. Fib on warfarin for anticoagulation.  She had a preserved EF by recent echo. She was admitted  with severe sepsis and found to have gram-negative rod bacteremia large perinephric abscess, and now has been complicated course with rapid A. Fib treated with IV diltiazem. She is on IV antibiotics, but despite this she has had altered mental status with likely aspiration and now currently is suffering from acute respiratory distress with tachypnea and severely increased work of breathing.  She did not tolerate trials of BiPAP either by facial mask or nasal mask. Despite her tachypnea and increased work of breathing, she is relatively well rate controlled on IV diltiazem with rates in the 90s. Her long exam reveals diffuse coarse rhonchi and likely rales. He has been given IV Lasix by the [facility] with brisk urine output, but continues to have respiratory distress.  From aortic stenosis standpoint, thankfully she is maintaining stable blood pressure with her systemic infection, unfortunately she is showing signs of being volume overloaded with rest or decompensation. I will re-dose her with IV Lasix, but without the ability to use positive pressure ventilation with BiPAP, I am fearful that her chance of recovery is very low. I agree with the concept of obtaining  a palliative care consultation, however with her being DO NOT RESUSCITATE, we may not get to that stage.  I spent 15 minutes talking with the daughter about the prognosis and our concerns.    Leonie Man, M.D., M.S. Interventional Cardiologist   Pager # [phone]

## 2014-06-17 NOTE — Care Management Note (Signed)
    Page 1 of 1   06/17/2014     11:47:15 AM CARE MANAGEMENT NOTE 06/17/2014  Patient:  Rita Miller, Rita Miller   Account Number:  [id number]  Date Initiated:  06/17/2014  Documentation initiated by:  Elissa Hefty  Subjective/Objective Assessment:   adm w sepsis     Action/Plan:   from [facility] facility, pcp dr s blythe   Anticipated DC Date:     Anticipated DC Plan:    In-house referral  Clinical Social Worker         Choice offered to / List presented to:             Status of service:   Medicare Important Message given?  YES (If response is "NO", the following Medicare IM given date fields will be blank) Date Medicare IM given:  06/17/2014 Medicare IM given by:  Elissa Hefty Date Additional Medicare IM given:   Additional Medicare IM given by:    Discharge Disposition:    Per UR Regulation:  Reviewed for med. necessity/level of care/duration of stay  If discussed at [place] of Stay Meetings, dates discussed:    Comments:

## 2014-06-17 NOTE — Progress Notes (Signed)
ANTICOAGULATION CONSULT NOTE - Initial Consult  Pharmacy Consult for heparin Indication: atrial fibrillation  Allergies  Allergen Reactions  . Donepezil Hydrochloride Palpitations and Other (See Comments)    REACTION: heart fluttering  . Oxycodone-Acetaminophen Other (See Comments)    REACTION: Hallucinations  . Morphine Sulfate Other (See Comments)    unknown    Patient Measurements: Height: 4\' 11"  (149.9 cm) Weight: 68 lb 12.5 oz (31.2 kg) IBW/kg (Calculated) : 43.2 Heparin Dosing Weight: 32kg  Vital Signs: Temp: 97.7 F (36.5 C) (11/10 1203) Temp Source: Oral (11/10 1203) BP: 136/47 mmHg (11/10 1203) Pulse Rate: 88 (11/10 1203)  Labs:  Recent Labs  06/15/14 0952  06/16/14 0306 06/16/14 1907 06/16/14 2103 06/17/14 0246 06/17/14 1300  HGB 9.3*  --  9.3*  --   --  9.2*  --   HCT 27.5*  --  27.3*  --   --  27.0*  --   PLT 171  --  185  --   --  144*  --   LABPROT >90.0*  < > 80.6* 21.2* 19.2* 19.1*  --   INR >10.00*  < > >10.00* 1.82* 1.60* 1.59*  --   HEPARINUNFRC  --   --   --   --   --  <0.10* <0.10*  CREATININE 1.39*  --  1.24*  --   --  1.18*  --   < > = values in this interval not displayed.  Estimated Creatinine Clearance: 18.1 mL/min (by C-G formula based on Cr of 1.18).   Medical History: Past Medical History  Diagnosis Date  . CAD (coronary artery disease)   . History of CVA (cerebrovascular accident)   . Atrial fibrillation     peristent  . Hyperlipemia   . COPD (chronic obstructive pulmonary disease)   . Epistaxis     recurrent  . Osteoporosis   . Hypothyroidism   . History of pulmonary embolism   . History of colonic diverticulitis   . History of breast cancer   . Asthma   . Iron deficiency anemia   . History of transfusion of whole blood   . LBBB (left bundle branch block)     history of  . Aortal stenosis     severe  . Pacemaker     bradycardia s/p pacemaker implant  . PAD (peripheral artery disease)     left leg occluded  viabahn stents x 3 left SFA  . Tachycardia-bradycardia syndrome     s/p PPM  . Cancer   . Arthritis   . Stroke   . HTN (hypertension) 09/25/2012  . Esophageal reflux 01/26/2014  . Left-sided visual neglect 03/03/2014    Medications:  Warfarin   Assessment: 80 year old female with history of afib on coumadin prior to admission. INR >10 on admit and aggressive reversal started for possible perc drain. INR is now down to 1.59 and pharmacy is dosing heparin gtt for the time being. Will continue to dose cautiously given age and wt (32kg).   HL remains <0.10 despite rate increase early this am. Per nurse, no interruptions in gtt and no noted s/s bleeding. H/H remains low but stable, plt have trended down slightly.  Goal of Therapy:  INR 2-3 Heparin level 0.3-0.7 units/ml Monitor platelets by anticoagulation protocol: Yes   Plan:  - Increase hep gtt to 650 u/hr - HL in 8 hours - Continue to monitor daily HL/CBC and any s/s bleeding - F/u goals of care and long-term anticoagulation plans  Harolyn Rutherford, PharmD Clinical Pharmacist - Resident Pager: [phone] Pharmacy: [phone] 06/17/2014 2:26 PM

## 2014-06-17 NOTE — Progress Notes (Addendum)
Patient ID: Rita Miller  female  SFK:[id number]    DOB: October 16, 1929    DOA: 07/05/2014  PCP: Penni Homans, MD   Addendum  4:10pm: discussed in detail with the daughter, Ms Rita Miller again. Patient has no significant improvement with the respiratory status, still with the tachypnea, not tolerating BiPAP or NRB. Patient's daughter at this time wants her mother to be comfort care goals, focus on improving breathing and pain. - Will give fentanyl 50 MCG 1 and placed on fentanyl 25-50 MCG q2hrs PRN, Ativan 1mg  x1 and q4 hrs PRN - scopolamine patch, dilaudid if fentanyl not affective - called for palliative care medicine assistance - discontinued all oral medications - DNR, DNI, comfort care  RAI,RIPUDEEP M.D. [facility] 06/17/2014, 4:12 PM  Pager: [phone]     Brief history of present illness Patient is a 80 year old female with history of CAD, CVA, A. Fib, COPD, hypothyroidism, on chronic", history of PE, severe aortic stenosis, mitral valve repair, chronic diastolic CHF, pacemaker, presented with shortness of breath, fever from nursing home. Patient was found to have fever of 104, also had mentioned abdominal pain and dyspnea. Chest x-ray showed bilateral pleural effusion with basilar atelectasis or infiltrates. CT of the abdomen and pelvis showed bilateral pleural effusions with basilar atelectasis or consolidation bilaterally, marked hydronephrosis of the right kidney, etiology not determined. Patient was admitted due to SIRS, HCAP, CHF exacerbation ( BNP 29,340), UTI also found to be in atrial fib with RVR and was treated with Cardizem. INR supratherapeutic at 6.1 on admission, now >10 consistently despite vitamin K x2. 11/9: blood cultures positive for gram-negative rods, urine culture showed multiple bacterial morphotypes,pro-calcitonin 16.5, KUB negative for any small bowel obstruction. ID consulted Urology following for marked hydronephrosis of the right  kidney  Assessment/Plan: Principal Problem:   Sepsis from HCAP (healthcare-associated pneumonia), UTI, acute respiratory failure, gram-negative rods bacteremia:  - blood cultures positive for gram-negative rods, urine culture showed multiple bacterial morphotypes - repeat blood cultures, urine cultures, ID consulted, started on ertapenem and discontinued vancomycin - On NRB mask, tachypneac, CT showed b/l pleural effusions, I/O's +2.8L, will place on IV lasix 40mg  x1 and then daily  Active Problems: Bilateral pleural effusions with acute on chronic CHF/diastolic exacerbation - likely precipitated due to sepsis/pneumonia, creatinine 2.0 at the time of arrival - 2-D echo in 7/15 showed EF of 55%, moderate LVH, very severe aortic stenosis - cardiology following  AK I: resolved  Right-sided marked hydronephrosis with abdominal pain: has history of frequent UTIs  - repeat CT results 11/9 showed right hydronephrosis with obstruction at UPJ, 9x4x14cm renal abscess. Small pleural effusion with possible reactive effusion, LLLobe collapse from poss aspiration. - IR consulted, planning perc drain and nephrostomy tube today, INR 1.5, will hold heparin gtt once procedure scheduled   Supratherapeutic INR :  - Now therapeutic, on heparin gtt (has hx of CVA's, severe AS, Afib)    Hypothyroidism - continue Synthroid    Severe aortic stenosis - Cardiology consulted, likely not a candidate of aortic valve repair given her multiple medical problems, age and comorbidities    Atrial fibrillation with RVR - continue Cardizem, on heparin gtt    MITRAL VALVE REPLACEMENT, HX OF, pacemaker - On chronic anticoagulation    COPD (chronic obstructive pulmonary disease): wheezing improved - on Xopenex and ipratropium nebs, continue Pulmicort    Protein-calorie malnutrition, severe - Nutrition consult once able to tolerate diet  Dementia - Continue Namenda  Sacral decub:  -stage II, wound  care   DVT  Prophylaxis: on heparin gtt  Code Status: DO NOT RESUSCITATE  Family Communication: called Patient's daughter, Rita Miller but unable to make contact  Addendum 11:35AM: discussed with patient's daughter at the bedside, patient currently in acute respiratory distress, tachypneic, explained in detail about sepsis, large renal abscess, needing percutaneous drain with nephrostomy tube. She requested for aggressive care today for at least 24 hours, BIPAP, aggressive diuresis and urological procedure. She is open to comfort care, palliative medicine consult or hospice if no significant improvement in next 24 hours.     Disposition:  Consultants:  Cardiology  Urology  Procedures:  None  Antibiotics:  IV vancomycin dc 11/9  IV cefepime dc 11/9  IV ertepnem 11/9 >>  Subjective: Patient seen and examined, tachypneac,tachycardia, SOB on NRB mask, no fevers, lethargic   Objective: Weight change:   Intake/Output Summary (Last 24 hours) at 06/17/14 0727 Last data filed at 06/17/14 0600  Gross per 24 hour  Intake 851.23 ml  Output      0 ml  Net 851.23 ml   Blood pressure 156/38, pulse 112, temperature 97.5 F (36.4 C), temperature source Oral, resp. rate 26, height 4\' 11"  (1.499 m), weight 31.2 kg (68 lb 12.5 oz), SpO2 90 %.  Physical Exam: General: Alert but in resp distress, on NRB mask CVS: S1-S2 clear,  3/6 SEM Chest:decreased breath at the bases Abdomen: soft , tender in the right flank area,  Extremities: no cyanosis, clubbing or edema noted bilaterally   Lab Results: Basic Metabolic Panel:  Recent Labs Lab 06/16/14 0306 06/17/14 0246  NA 141 143  K 4.1 4.9  CL 110 114*  CO2 14* 11*  GLUCOSE 197* 163*  BUN 97* 101*  CREATININE 1.24* 1.18*  CALCIUM 8.2* 8.3*   Liver Function Tests:  Recent Labs Lab 06/17/2014 2108 06/14/14 0530  AST 56* 43*  ALT 18 17  ALKPHOS 113 108  BILITOT 0.6 0.9  PROT 7.0 6.5  ALBUMIN 2.1* 1.9*    Recent Labs Lab  06/19/2014 2108 06/14/14 0530  LIPASE 20 14   No results for input(s): AMMONIA in the last 168 hours. CBC:  Recent Labs Lab 06/14/14 0530  06/16/14 0306 06/17/14 0246  WBC 15.8*  < > 25.3* 26.6*  NEUTROABS 14.6*  --   --   --   HGB 9.5*  < > 9.3* 9.2*  HCT 28.5*  < > 27.3* 27.0*  MCV 92.5  < > 88.1 89.7  PLT 218  < > 185 144*  < > = values in this interval not displayed. Cardiac Enzymes:  Recent Labs Lab 07/07/2014 2108  TROPONINI <0.30   BNP: Invalid input(s): POCBNP CBG:  Recent Labs Lab 06/16/14 0814 06/16/14 1219 06/16/14 1511 06/16/14 2120  GLUCAP 166* 202* 162* 118*     Micro Results: Recent Results (from the past 240 hour(s))  Culture, blood (routine x 2)     Status: None (Preliminary result)   Collection Time: 06/19/2014  8:45 PM  Result Value Ref Range Status   Specimen Description BLOOD RIGHT FOREARM  Final   Special Requests BOTTLES DRAWN AEROBIC AND ANAEROBIC 5 CC  Final   Culture  Setup Time   Final    06/14/2014 01:25 Performed at [facility]    Culture   Final    GRAM NEGATIVE RODS Note: Gram Stain Report Called to,Read Back By and Verified With: Glenford Peers RN on 06/15/14 at 22:25 by Rise Mu Performed at [facility]  Report Status PENDING  Incomplete  Urine culture     Status: None   Collection Time: 06/11/2014  9:01 PM  Result Value Ref Range Status   Specimen Description URINE, RANDOM  Final   Special Requests NONE  Final   Culture  Setup Time   Final    06/14/2014 03:40 Performed at [place]   Final    >=100,000 COLONIES/ML Performed at [facility]    Culture   Final    Multiple bacterial morphotypes present, none predominant. Suggest appropriate recollection if clinically indicated. Performed at [facility]    Report Status 06/16/2014 FINAL  Final  Culture, blood (routine x 2)     Status: None (Preliminary result)   Collection Time: 06/14/2014  9:08 PM  Result  Value Ref Range Status   Specimen Description BLOOD LEFT HAND  Final   Special Requests BOTTLES DRAWN AEROBIC ONLY 5 CC  Final   Culture  Setup Time   Final    06/14/2014 01:25 Performed at [facility]    Culture   Final           BLOOD CULTURE RECEIVED NO GROWTH TO DATE CULTURE WILL BE HELD FOR 5 DAYS BEFORE ISSUING A FINAL NEGATIVE REPORT Performed at [facility]    Report Status PENDING  Incomplete  MRSA PCR Screening     Status: None   Collection Time: 06/14/14  6:45 AM  Result Value Ref Range Status   MRSA by PCR NEGATIVE NEGATIVE Final    Comment:        The GeneXpert MRSA Assay (FDA approved for NASAL specimens only), is one component of a comprehensive MRSA colonization surveillance program. It is not intended to diagnose MRSA infection nor to guide or monitor treatment for MRSA infections.     Studies/Results: Ct Abdomen Pelvis Wo Contrast  06/16/2014   CLINICAL DATA:  Abdominal pain.  Sepsis and bacteremia.  EXAM: CT ABDOMEN AND PELVIS WITHOUT CONTRAST  TECHNIQUE: Multidetector CT imaging of the abdomen and pelvis was performed following the standard protocol without IV contrast.  COMPARISON:  06/14/2014  FINDINGS: BODY WALL: Anasarca  LOWER CHEST: Small right pleural effusion which is increased from before. Scalloped appearance of the atelectatic right lower lobe suggest complexity, especially in light of neighboring subdiaphragmatic fluid collection. There is extensive debris within the left lower lobe bronchi, with the visualize left lower lobe collapse.  Cardiomegaly.  Extensive aortic valve sclerosis/calcification.  ABDOMEN/PELVIS:  Liver: No focal abnormality.  Biliary: No evidence of biliary obstruction or stone.  Pancreas: Unremarkable.  Spleen: Unremarkable.  Adrenals: Unremarkable.  Kidneys and ureters: There is advanced hydronephrosis on the right with abrupt transition at the UPJ. Noted history of congenital UPJ obstruction and right  hydronephrosis, but the degree of collecting system dilatation is significantly increased from 01/20/2014. No stone or other discrete obstructive process identified. There is a right posterior para renal fluid collection which has a discrete Miller inferiorly, with continuation superiorly to the subdiaphragmatic retroperitoneum. The collection measures 9 x 4 x 14 cm in maximal dimension. Left renal cysts again noted. Tiny high-density lesion in the upper pole left kidney is likely a hemorrhagic cyst. No left urinary obstruction.  Bladder: Moderate distention. Discrete fluid collection right of the bladder is a diverticulum with fluctuating size on recent imaging.  Reproductive: Unremarkable for age.  Bowel: Prominent colonic stool, especially in the rectum. There are no associated inflammatory changes. Severe distal colonic  diverticulosis. No pericecal inflammatory changes.  Retroperitoneum: No mass or adenopathy.  Peritoneum: No ascites or pneumoperitoneum.  Vascular: Extensive atherosclerosis. There is a saccular aneurysm directed to the right of the upper abdominal aorta which is predominately thrombosed based on infused CT 01/20/2014. This peripherally calcified aneurysm measures 22 mm in diameter. Fusiform aneurysmal enlargement of the infrarenal aorta, up to 4.2 cm in diameter. No new displacement of intimal calcification or surrounding fat stranding to suggest rupture or leakage.  OSSEOUS: Total right hip arthroplasty. Status post left femoral screw. Visible portions of the hardware are unremarkable. Remote superior endplate fracture of L1.  These results were called by telephone at the time of interpretation on 06/16/2014 at 5:36 pm to Dr. Nira Conn RAI , who verbally acknowledged these results.  IMPRESSION: 1. Right hydronephrosis with obstruction at the UPJ, cause uncertain. There is a 9 x 4 x 14 cm right posterior pararenal fluid collection, likely abscess given the clinical circumstances. 2. Small but  increasing right pleural effusion with complex features. This could represent empyema or reactive effusion. 3. Left lower lobe collapse. There is extensive endobronchial debris, question aspiration. 4. Right bladder diverticulum. 5. Saccular and fusiform abdominal aortic aneurysms, as above.   Electronically Signed   By: Jorje Guild M.D.   On: 06/16/2014 17:39   Ct Abdomen Pelvis Wo Contrast  06/14/2014   CLINICAL DATA:  Fever and shortness of breath.  Abdominal pain.  EXAM: CT ABDOMEN AND PELVIS WITHOUT CONTRAST  TECHNIQUE: Multidetector CT imaging of the abdomen and pelvis was performed following the standard protocol without IV contrast.  COMPARISON:  01/20/2014  FINDINGS: Small bilateral pleural effusions with basilar atelectasis or consolidation. Extensive vascular calcifications in the Chest involving aorta and Coronary arteries. Cardiac pacemaker.  Unenhanced appearance of the liver, spleen, gallbladder, pancreas, adrenal glands, inferior vena cava, and retroperitoneal lymph nodes is unremarkable except for vascular calcifications. Since the previous study, there is new development of fluid around the liver and right kidney suggesting ascites or reactive edema. There is marked right renal hydronephrosis without definite pyelocaliectasis. Ureteral obstruction is suspected but causes not confirmed on this study. Visualization of the low pelvis and distal ureter is limited due to streak artifact from bilateral hip hardware. Multiple parenchymal lesions in both kidneys probably representing small cysts although too small to characterize. These were present previously. There is extensive calcification of the aorta and iliac arteries. Abdominal aortic aneurysm measuring 3.8 cm maximal AP diameter. 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 This is similar to previous study. Calcification in the internal and external iliac arteries  bilaterally likely represent significant stenosis. Stomach and small bowel do not appear abnormally distended. Diffusely stool-filled colon likely indicating constipation. No free air in the abdomen.  Pelvis: Limited visualization due to surgical hardware in the hips. Visualized bladder wall is not thickened. Prominent stool in the rectosigmoid colon likely indicating constipation. Colonic diverticula are present. Appendix is not identified. Suggestion of focal fluid collection in the right pelvis of nonspecific etiology. This measures about 2.9 cm diameter. This could represent ovarian cyst. Uterus is not identified and may be surgically absent or atrophic. Calcified granulomas in the subcutaneous fat over the gluteal regions consistent with injection granulomas. Diffuse bone demineralization. Endplate compression fractures at L1. No change in appearance of the bones since the previous study.  IMPRESSION: Small bilateral pleural effusions with basilar atelectasis or consolidation bilaterally. New free fluid in the right  upper quadrant which may indicate ascites or reactive fluid. Marked hydronephrosis of the right kidney. Etiology is not determined. Abdominal aortic aneurysm with additional eccentric saccular aneurysm. No change since prior study. Extensive vascular calcifications with likely vascular compromise in the iliac arteries bilaterally. Small fluid collection in the right pelvis of nonspecific etiology.   Electronically Signed   By: Lucienne Capers M.D.   On: 06/14/2014 01:18   Dg Chest Port 1 View  06/21/2014   CLINICAL DATA:  Shortness of breath and fever for 1 day  EXAM: PORTABLE CHEST - 1 VIEW  COMPARISON:  03/07/2014  FINDINGS: Cardiac pacemaker. Postoperative changes in the right breast and right axilla. Mild cardiac enlargement. Shallow inspiration with infiltration or atelectasis in the lung bases. This is increasing since previous study. No pneumothorax. Calcified and tortuous aorta with  prominent pulmonary outflow tract.  IMPRESSION: Bilateral pleural effusions with basilar atelectasis or infiltration, increasing since previous study. Mild cardiac enlargement with prominent pulmonary outflow tract   Electronically Signed   By: Lucienne Capers M.D.   On: 07/05/2014 21:22   Dg Abd Portable 1v  06/16/2014   CLINICAL DATA:  Abdominal pain, fever and elevated white blood cell count.  EXAM: PORTABLE ABDOMEN - 1 VIEW  COMPARISON:  03/05/2014 and CT on 06/14/2014.  FINDINGS: No bowel obstruction identified. There is moderate fecal material in the distal colon and rectum. Vascular calcifications are seen involving the abdominal aorta and iliac arteries with indwelling left iliac arterial stent present.  IMPRESSION: No evidence of bowel obstruction.   Electronically Signed   By: Aletta Edouard M.D.   On: 06/16/2014 08:21    Medications: Scheduled Meds: . antiseptic oral rinse  7 mL Mouth Rinse q12n4p  . budesonide (PULMICORT) nebulizer solution  0.5 mg Nebulization BID  . chlorhexidine  15 mL Mouth Rinse BID  . docusate sodium  100 mg Oral BID  . feeding supplement (PRO-STAT SUGAR FREE 64)  30 mL Oral TID WC  . furosemide  40 mg Intravenous Daily  . guaiFENesin  600 mg Oral BID  . imipenem-cilastatin  250 mg Intravenous Q12H  . insulin aspart  0-5 Units Subcutaneous QHS  . insulin aspart  0-9 Units Subcutaneous TID WC  . levalbuterol  0.63 mg Nebulization QID   And  . ipratropium  0.5 mg Nebulization QID  . levothyroxine  75 mcg Oral QAC breakfast  . memantine  10 mg Oral BID  . pantoprazole  40 mg Oral Daily  . pravastatin  20 mg Oral QHS  . Rivastigmine  1 patch Transdermal Daily      LOS: 4 days   RAI,RIPUDEEP M.D. [facility] 06/17/2014, 7:27 AM Pager: [phone]  If 7PM-7AM, please contact night-coverage [email or web address] Password [email or web address]

## 2014-06-17 NOTE — Progress Notes (Signed)
Spoke with Dr Tana Coast, regarding pt still very tachipnic after 40mg  lasix at 0700.  At this time i have given another 20mg  lasix IV, and 97mcg Fentanyl for discomfort.  VS RR 42, BP 152/63, sats 97% on NRBM, HR 101.  IR procedure on hold for the moment pending improvement of resp status.    Carol Ada, RN

## 2014-06-17 NOTE — Progress Notes (Signed)
ANTICOAGULATION CONSULT NOTE - Follow Up Consult  Pharmacy Consult for heparin Indication: atrial fibrillation  Labs:  Recent Labs  06/14/14 0530 06/15/14 0952  06/16/14 0306 06/16/14 1907 06/16/14 2103 06/17/14 0246  HGB 9.5* 9.3*  --  9.3*  --   --  9.2*  HCT 28.5* 27.5*  --  27.3*  --   --  27.0*  PLT 218 171  --  185  --   --  144*  LABPROT 54.7* >90.0*  < > 80.6* 21.2* 19.2* 19.1*  INR 6.11* >10.00*  < > >10.00* 1.82* 1.60* 1.59*  HEPARINUNFRC  --   --   --   --   --   --  <0.10*  CREATININE 1.78* 1.39*  --  1.24*  --   --   --   < > = values in this interval not displayed.   Assessment: 80yo female undetectable on heparin with initial dosing for low INR; lab drawn early but would expect some accumulation by now if rate were appropriate.  Goal of Therapy:  Heparin level 0.3-0.7 units/ml   Plan:  Will increase heparin gtt by 1-2 units/kg/hr to 550 units/hr and check level in [place], PharmD, BCPS  06/17/2014,3:36 AM

## 2014-06-17 NOTE — Progress Notes (Signed)
[facility]    Subjective: In respiratory distress using accessory muscles   Antibiotics:  Anti-infectives    Start     Dose/Rate Route Frequency Ordered Stop   06/16/14 1100  imipenem-cilastatin (PRIMAXIN) 250 mg in sodium chloride 0.9 % 100 mL IVPB     250 mg200 mL/hr over 30 Minutes Intravenous Every 12 hours 06/16/14 1020     06/15/14 0000  vancomycin (VANCOCIN) 500 mg in sodium chloride 0.9 % 100 mL IVPB  Status:  Discontinued     500 mg100 mL/hr over 60 Minutes Intravenous Every 48 hours 07/07/2014 2304 06/14/14 0007   06/15/14 0000  vancomycin (VANCOCIN) 500 mg in sodium chloride 0.9 % 100 mL IVPB  Status:  Discontinued     500 mg100 mL/hr over 60 Minutes Intravenous Every 48 hours 06/14/14 0247 06/16/14 0940   06/14/14 2300  ceFEPIme (MAXIPIME) 500 mg in dextrose 5 % 50 mL IVPB  Status:  Discontinued     500 mg100 mL/hr over 30 Minutes Intravenous Every 24 hours 06/14/14 1527 06/16/14 0939   06/14/14 2200  ceFEPIme (MAXIPIME) 1 g in dextrose 5 % 50 mL IVPB  Status:  Discontinued     1 g100 mL/hr over 30 Minutes Intravenous Every 24 hours 06/08/2014 2304 06/14/14 1523   06/14/14 0600  ceFEPIme (MAXIPIME) 1 g in dextrose 5 % 50 mL IVPB  Status:  Discontinued     1 g100 mL/hr over 30 Minutes Intravenous 3 times per day 06/14/14 0245 06/14/14 0247   06/22/2014 2130  ceFEPIme (MAXIPIME) 2 g in dextrose 5 % 50 mL IVPB     2 g100 mL/hr over 30 Minutes Intravenous  Once 06/16/2014 2127 06/14/14 0300   06/11/2014 2130  vancomycin (VANCOCIN) IVPB 1000 mg/200 mL premix     1,000 mg200 mL/hr over 60 Minutes Intravenous  Once 06/08/2014 2127 06/14/14 0002      Medications: Scheduled Meds: . antiseptic oral rinse  7 mL Mouth Rinse q12n4p  . budesonide (PULMICORT) nebulizer solution  0.5 mg Nebulization BID  . chlorhexidine  15 mL Mouth Rinse BID  . docusate sodium  100 mg Oral BID  . feeding supplement (PRO-STAT SUGAR FREE 64)  30 mL Oral TID WC  . furosemide  40 mg  Intravenous Daily  . guaiFENesin  600 mg Oral BID  . imipenem-cilastatin  250 mg Intravenous Q12H  . insulin aspart  0-5 Units Subcutaneous QHS  . insulin aspart  0-9 Units Subcutaneous TID WC  . levalbuterol  0.63 mg Nebulization QID   And  . ipratropium  0.5 mg Nebulization QID  . levothyroxine  75 mcg Oral QAC breakfast  . memantine  10 mg Oral BID  . nitroGLYCERIN  1 inch Topical 3 times per day  . pantoprazole (PROTONIX) IV  40 mg Intravenous Q12H  . pravastatin  20 mg Oral QHS  . Rivastigmine  1 patch Transdermal Daily   Continuous Infusions: . sodium chloride 10 mL/hr at 06/17/14 0700  . sodium chloride Stopped (06/17/14 0900)  . diltiazem (CARDIZEM) infusion 10 mg/hr (06/17/14 0700)  . heparin 550 Units/hr (06/17/14 0700)   PRN Meds:.sodium chloride, acetaminophen, fentaNYL, food thickener, ondansetron    Objective: Weight change:   Intake/Output Summary (Last 24 hours) at 06/17/14 1234 Last data filed at 06/17/14 0900  Gross per 24 hour  Intake 682.73 ml  Output      0 ml  Net 682.73 ml   Blood pressure 136/47, pulse 88, temperature  97.7 F (36.5 C), temperature source Oral, resp. rate 26, height 4\' 11"  (1.499 m), weight 68 lb 12.5 oz (31.2 kg), SpO2 94 %. Temp:  [97.4 F (36.3 C)-98.1 F (36.7 C)] 97.7 F (36.5 C) (11/10 1203) Pulse Rate:  [75-112] 88 (11/10 1203) Resp:  [24-54] 26 (11/10 1203) BP: (136-167)/(38-86) 136/47 mmHg (11/10 1203) SpO2:  [90 %-95 %] 94 % (11/10 1203) Weight:  [68 lb 12.5 oz (31.2 kg)] 68 lb 12.5 oz (31.2 kg) (11/10 0500)  Physical Exam: General: Awake using accessory resp muscles neck to breath, tachypnic, alerted RN who had already alerted primary team NIPPV is being considered  HEENT: anicteric sclera,  CVS irr irr tachycardic,  Chest: tachypnic Skin: no rashes Lymph: no new lymphadenopathy Neuro: nonfocal  CBC:  Recent Labs Lab 06/17/2014 2108 06/14/14 0530 06/15/14 0952  06/15/14 2101 06/16/14 0306 06/16/14 1907  06/16/14 2103 06/17/14 0246  HGB 9.8* 9.5* 9.3*  --   --  9.3*  --   --  9.2*  HCT 29.6* 28.5* 27.5*  --   --  27.3*  --   --  27.0*  PLT 267 218 171  --   --  185  --   --  144*  INR 5.59* 6.11* >10.00*  < > >10.00* >10.00* 1.82* 1.60* 1.59*  < > = values in this interval not displayed.   BMET  Recent Labs  06/16/14 0306 06/17/14 0246  NA 141 143  K 4.1 4.9  CL 110 114*  CO2 14* 11*  GLUCOSE 197* 163*  BUN 97* 101*  CREATININE 1.24* 1.18*  CALCIUM 8.2* 8.3*     Liver Panel  No results for input(s): PROT, ALBUMIN, AST, ALT, ALKPHOS, BILITOT, BILIDIR, IBILI in the last 72 hours.     Sedimentation Rate No results for input(s): ESRSEDRATE in the last 72 hours. C-Reactive Protein No results for input(s): CRP in the last 72 hours.  Micro Results: Recent Results (from the past 720 hour(s))  Culture, blood (routine x 2)     Status: None   Collection Time: 07/07/2014  8:45 PM  Result Value Ref Range Status   Specimen Description BLOOD RIGHT FOREARM  Final   Special Requests BOTTLES DRAWN AEROBIC AND ANAEROBIC 5 CC  Final   Culture  Setup Time   Final    06/14/2014 01:25 Performed at [facility]    Culture   Final    BACTEROIDES FRAGILIS Note: BETA LACTAMASE NEGATIVE Note: Gram Stain Report Called to,Read Back By and Verified With: Glenford Peers RN on 06/15/14 at 22:25 by Rise Mu Performed at [facility]    Report Status 06/17/2014 FINAL  Final  Urine culture     Status: None   Collection Time: 06/29/2014  9:01 PM  Result Value Ref Range Status   Specimen Description URINE, RANDOM  Final   Special Requests NONE  Final   Culture  Setup Time   Final    06/14/2014 03:40 Performed at [place]   Final    >=100,000 COLONIES/ML Performed at [facility]    Culture   Final    Multiple bacterial morphotypes present, none predominant. Suggest appropriate recollection if clinically indicated. Performed at [facility]    Report Status 06/16/2014 FINAL  Final  Culture, blood (routine x 2)     Status: None (Preliminary result)   Collection Time: 06/15/2014  9:08 PM  Result Value Ref Range Status   Specimen  Description BLOOD LEFT HAND  Final   Special Requests BOTTLES DRAWN AEROBIC ONLY 5 CC  Final   Culture  Setup Time   Final    06/14/2014 01:25 Performed at [facility]    Culture   Final           BLOOD CULTURE RECEIVED NO GROWTH TO DATE CULTURE WILL BE HELD FOR 5 DAYS BEFORE ISSUING A FINAL NEGATIVE REPORT Performed at [facility]    Report Status PENDING  Incomplete  MRSA PCR Screening     Status: None   Collection Time: 06/14/14  6:45 AM  Result Value Ref Range Status   MRSA by PCR NEGATIVE NEGATIVE Final    Comment:        The GeneXpert MRSA Assay (FDA approved for NASAL specimens only), is one component of a comprehensive MRSA colonization surveillance program. It is not intended to diagnose MRSA infection nor to guide or monitor treatment for MRSA infections.   Culture, blood (routine x 2)     Status: None (Preliminary result)   Collection Time: 06/16/14 10:05 AM  Result Value Ref Range Status   Specimen Description BLOOD LEFT HAND  Final   Special Requests   Final    BOTTLES DRAWN AEROBIC AND ANAEROBIC 10CC BLUE, 5CC RED   Culture  Setup Time   Final    06/16/2014 17:18 Performed at [facility]    Culture   Final           BLOOD CULTURE RECEIVED NO GROWTH TO DATE CULTURE WILL BE HELD FOR 5 DAYS BEFORE ISSUING A FINAL NEGATIVE REPORT Performed at [facility]    Report Status PENDING  Incomplete  Culture, blood (routine x 2)     Status: None (Preliminary result)   Collection Time: 06/16/14 10:15 AM  Result Value Ref Range Status   Specimen Description BLOOD RIGHT HAND  Final   Special Requests   Final    BOTTLES DRAWN AEROBIC AND ANAEROBIC 10CC BLUE, 5CC RED   Culture  Setup Time   Final    06/16/2014 17:18 Performed at  [facility]    Culture   Final           BLOOD CULTURE RECEIVED NO GROWTH TO DATE CULTURE WILL BE HELD FOR 5 DAYS BEFORE ISSUING A FINAL NEGATIVE REPORT Performed at [facility]    Report Status PENDING  Incomplete  Urine culture     Status: None   Collection Time: 06/16/14 11:26 AM  Result Value Ref Range Status   Specimen Description URINE, CATHETERIZED  Final   Special Requests NONE  Final   Culture  Setup Time   Final    06/16/2014 11:45 Performed at [place] NO GROWTH Performed at [facility]   Final   Culture NO GROWTH Performed at [facility]   Final   Report Status 06/17/2014 FINAL  Final    Studies/Results: Ct Abdomen Pelvis Wo Contrast  06/16/2014   CLINICAL DATA:  Abdominal pain.  Sepsis and bacteremia.  EXAM: CT ABDOMEN AND PELVIS WITHOUT CONTRAST  TECHNIQUE: Multidetector CT imaging of the abdomen and pelvis was performed following the standard protocol without IV contrast.  COMPARISON:  06/14/2014  FINDINGS: BODY WALL: Anasarca  LOWER CHEST: Small right pleural effusion which is increased from before. Scalloped appearance of the atelectatic right lower lobe suggest complexity, especially in light of neighboring subdiaphragmatic fluid collection. There is extensive  debris within the left lower lobe bronchi, with the visualize left lower lobe collapse.  Cardiomegaly.  Extensive aortic valve sclerosis/calcification.  ABDOMEN/PELVIS:  Liver: No focal abnormality.  Biliary: No evidence of biliary obstruction or stone.  Pancreas: Unremarkable.  Spleen: Unremarkable.  Adrenals: Unremarkable.  Kidneys and ureters: There is advanced hydronephrosis on the right with abrupt transition at the UPJ. Noted history of congenital UPJ obstruction and right hydronephrosis, but the degree of collecting system dilatation is significantly increased from 01/20/2014. No stone or other discrete obstructive process identified. There is a  right posterior para renal fluid collection which has a discrete rim inferiorly, with continuation superiorly to the subdiaphragmatic retroperitoneum. The collection measures 9 x 4 x 14 cm in maximal dimension. Left renal cysts again noted. Tiny high-density lesion in the upper pole left kidney is likely a hemorrhagic cyst. No left urinary obstruction.  Bladder: Moderate distention. Discrete fluid collection right of the bladder is a diverticulum with fluctuating size on recent imaging.  Reproductive: Unremarkable for age.  Bowel: Prominent colonic stool, especially in the rectum. There are no associated inflammatory changes. Severe distal colonic diverticulosis. No pericecal inflammatory changes.  Retroperitoneum: No mass or adenopathy.  Peritoneum: No ascites or pneumoperitoneum.  Vascular: Extensive atherosclerosis. There is a saccular aneurysm directed to the right of the upper abdominal aorta which is predominately thrombosed based on infused CT 01/20/2014. This peripherally calcified aneurysm measures 22 mm in diameter. Fusiform aneurysmal enlargement of the infrarenal aorta, up to 4.2 cm in diameter. No new displacement of intimal calcification or surrounding fat stranding to suggest rupture or leakage.  OSSEOUS: Total right hip arthroplasty. Status post left femoral screw. Visible portions of the hardware are unremarkable. Remote superior endplate fracture of L1.  These results were called by telephone at the time of interpretation on 06/16/2014 at 5:36 pm to Dr. Nira Conn RAI , who verbally acknowledged these results.  IMPRESSION: 1. Right hydronephrosis with obstruction at the UPJ, cause uncertain. There is a 9 x 4 x 14 cm right posterior pararenal fluid collection, likely abscess given the clinical circumstances. 2. Small but increasing right pleural effusion with complex features. This could represent empyema or reactive effusion. 3. Left lower lobe collapse. There is extensive endobronchial debris,  question aspiration. 4. Right bladder diverticulum. 5. Saccular and fusiform abdominal aortic aneurysms, as above.   Electronically Signed   By: Jorje Guild M.D.   On: 06/16/2014 17:39   Dg Abd Portable 1v  06/16/2014   CLINICAL DATA:  Abdominal pain, fever and elevated white blood cell count.  EXAM: PORTABLE ABDOMEN - 1 VIEW  COMPARISON:  03/05/2014 and CT on 06/14/2014.  FINDINGS: No bowel obstruction identified. There is moderate fecal material in the distal colon and rectum. Vascular calcifications are seen involving the abdominal aorta and iliac arteries with indwelling left iliac arterial stent present.  IMPRESSION: No evidence of bowel obstruction.   Electronically Signed   By: Aletta Edouard M.D.   On: 06/16/2014 08:21      Assessment/Plan:  Principal Problem:   Sepsis Active Problems:   Hypothyroidism   Iron deficiency anemia   Severe aortic stenosis   Atrial fibrillation   MITRAL VALVE REPLACEMENT, HX OF   PACEMAKER-[employer]   PVD (peripheral vascular disease)   COPD (chronic obstructive pulmonary disease)   Protein-calorie malnutrition, severe   HCAP (healthcare-associated pneumonia)   AKI (acute kidney injury)   Atrial fibrillation with RVR   Abdominal pain   Blood poisoning   Supratherapeutic INR  UTI (lower urinary tract infection)   Gram-negative bacteremia   Severe malnutrition   Sacral decubitus ulcer    Rita Miller is a 80 y.o. female with  With now newly discovered LARGE RENAL ABSCESS, BACTEROIDES FRAGILIS bacteremia and septic shock with hx of pacemaker, AF, CAD, COPD also with supratherapeutic INR, right sided hydronephrosis  #1 BACTEROIDES FRAGILIS BACTEREMIA and RENAL ABSCESS:  --SHE IS MORE THAN SUFFICIENTLY COVERED FOR B FRAGILIS PLUS OTHER GUT MICROBES OUTSIDE OF CRE AND AMP R enterococcus  --issue if she can survive is draining her renal abscess and sending for culture as well   #2 HCAP: I think she has more pleural effusions with  atelectasis than PNA and I think she is VERY broadly covered. Dont think she needs vancomycin   LOS: 4 days   Alcide Evener 06/17/2014, 12:34 PM

## 2014-06-17 NOTE — Progress Notes (Signed)
Pt attempted on Bipap with nasal & full face mask with inability to seal. RN notified.

## 2014-06-17 NOTE — Progress Notes (Addendum)
Patient ID: Rita Miller, female   DOB: [birth year], 80 y.o.   MRN: [id number]    Subjective: Pt's clinical condition appears to be deteriorating overnight. Repeat CT of the abdomen and pelvis was performed last evening. She is now on imipenem per Infectious Disease.   Objective: Vital signs in last 24 hours: Temp:  [97.4 F (36.3 C)-97.5 F (36.4 C)] 97.5 F (36.4 C) (11/10 0400) Pulse Rate:  [75-112] 112 (11/10 0400) Resp:  [24-38] 26 (11/10 0400) BP: (128-170)/(38-86) 156/38 mmHg (11/10 0400) SpO2:  [89 %-94 %] 90 % (11/10 0400) Weight:  [31.2 kg (68 lb 12.5 oz)] 31.2 kg (68 lb 12.5 oz) (11/10 0500)  Intake/Output from previous day: 11/09 0701 - 11/10 0700 In: 851.2 [P.O.:270; I.V.:381.2; IV Piggyback:200] Out: -  Intake/Output this shift:    Physical Exam:  General: Alert and oriented Abd: She still indicates severe pain over her abdomen diffusely and still with R CVAT.  Lab Results:  Recent Labs  06/15/14 0952 06/16/14 0306 06/17/14 0246  HGB 9.3* 9.3* 9.2*  HCT 27.5* 27.3* 27.0*   BMET  Recent Labs  06/16/14 0306 06/17/14 0246  NA 141 143  K 4.1 4.9  CL 110 114*  CO2 14* 11*  GLUCOSE 197* 163*  BUN 97* 101*  CREATININE 1.24* 1.18*  CALCIUM 8.2* 8.3*     Studies/Results: Ct Abdomen Pelvis Wo Contrast  06/16/2014   CLINICAL DATA:  Abdominal pain.  Sepsis and bacteremia.  EXAM: CT ABDOMEN AND PELVIS WITHOUT CONTRAST  TECHNIQUE: Multidetector CT imaging of the abdomen and pelvis was performed following the standard protocol without IV contrast.  COMPARISON:  06/14/2014  FINDINGS: BODY WALL: Anasarca  LOWER CHEST: Small right pleural effusion which is increased from before. Scalloped appearance of the atelectatic right lower lobe suggest complexity, especially in light of neighboring subdiaphragmatic fluid collection. There is extensive debris within the left lower lobe bronchi, with the visualize left lower lobe collapse.  Cardiomegaly.  Extensive aortic  valve sclerosis/calcification.  ABDOMEN/PELVIS:  Liver: No focal abnormality.  Biliary: No evidence of biliary obstruction or stone.  Pancreas: Unremarkable.  Spleen: Unremarkable.  Adrenals: Unremarkable.  Kidneys and ureters: There is advanced hydronephrosis on the right with abrupt transition at the UPJ. Noted history of congenital UPJ obstruction and right hydronephrosis, but the degree of collecting system dilatation is significantly increased from 01/20/2014. No stone or other discrete obstructive process identified. There is a right posterior para renal fluid collection which has a discrete rim inferiorly, with continuation superiorly to the subdiaphragmatic retroperitoneum. The collection measures 9 x 4 x 14 cm in maximal dimension. Left renal cysts again noted. Tiny high-density lesion in the upper pole left kidney is likely a hemorrhagic cyst. No left urinary obstruction.  Bladder: Moderate distention. Discrete fluid collection right of the bladder is a diverticulum with fluctuating size on recent imaging.  Reproductive: Unremarkable for age.  Bowel: Prominent colonic stool, especially in the rectum. There are no associated inflammatory changes. Severe distal colonic diverticulosis. No pericecal inflammatory changes.  Retroperitoneum: No mass or adenopathy.  Peritoneum: No ascites or pneumoperitoneum.  Vascular: Extensive atherosclerosis. There is a saccular aneurysm directed to the right of the upper abdominal aorta which is predominately thrombosed based on infused CT 01/20/2014. This peripherally calcified aneurysm measures 22 mm in diameter. Fusiform aneurysmal enlargement of the infrarenal aorta, up to 4.2 cm in diameter. No new displacement of intimal calcification or surrounding fat stranding to suggest rupture or leakage.  OSSEOUS: Total right hip arthroplasty. Status  post left femoral screw. Visible portions of the hardware are unremarkable. Remote superior endplate fracture of L1.  These results  were called by telephone at the time of interpretation on 06/16/2014 at 5:36 pm to Dr. Nira Conn RAI , who verbally acknowledged these results.  IMPRESSION: 1. Right hydronephrosis with obstruction at the UPJ, cause uncertain. There is a 9 x 4 x 14 cm right posterior pararenal fluid collection, likely abscess given the clinical circumstances. 2. Small but increasing right pleural effusion with complex features. This could represent empyema or reactive effusion. 3. Left lower lobe collapse. There is extensive endobronchial debris, question aspiration. 4. Right bladder diverticulum. 5. Saccular and fusiform abdominal aortic aneurysms, as above.   Electronically Signed   By: Jorje Guild M.D.   On: 06/16/2014 17:39   Dg Abd Portable 1v  06/16/2014   CLINICAL DATA:  Abdominal pain, fever and elevated white blood cell count.  EXAM: PORTABLE ABDOMEN - 1 VIEW  COMPARISON:  03/05/2014 and CT on 06/14/2014.  FINDINGS: No bowel obstruction identified. There is moderate fecal material in the distal colon and rectum. Vascular calcifications are seen involving the abdominal aorta and iliac arteries with indwelling left iliac arterial stent present.  IMPRESSION: No evidence of bowel obstruction.   Electronically Signed   By: Aletta Edouard M.D.   On: 06/16/2014 08:21    I have reviewed her CT scan independently.  Urine culture: Multiple bacterial morphotypes without a predominant bacterial type.  Lab Results  Component Value Date   INR 1.59* 06/17/2014   INR 1.60* 06/16/2014   INR 1.82* 06/16/2014    Assessment/Plan: 1) Right hydronephrosis/bacteremia: Her clinical conditions appears to have deteriorated in the last 24 hours. I have reviewed her CT scan which continues to demonstrate right hydronephrosis with an enlarging right retroperitoneal fluid collection.  This appears to be in the retroperitoneum and paranephric space (outside Gerota's fascia) but not in the perinephric space. The source of this fluid  collection remains unclear.  This does NOT appear to have originated from a renal abscess. There is still no obvious explanation or source for ureteral obstruction on imaging.  Considering the change in her clinical condition, I would recommend that Interventional Radiology be consulted for percutaneous drainage of fluid collection AND percutaneous right nephrostomy tube placement with cultures obtained from each to help direct further care.  With INR improved, I would recommend this occur today and I think the risk of bleeding would be well worth the potential benefits in this situation.  At this time, I think retrograde ureteral stenting would not be beneficial as it would only address the hydronephrosis but not the fluid collection that appears enlarged.     LOS: 4 days   Berdia Lachman,LES 06/17/2014, 7:06 AM

## 2014-06-17 NOTE — Progress Notes (Addendum)
SLP Cancellation Note  Patient Details Name: Rita Miller MRN: [id number] DOB: [birth year]   Cancelled treatment:       Reason Eval/Treat Not Completed: Medical issues which prohibited therapy.  Pt tachypneic (RR in 40s),  on NRB and currently NPO for procedure.  Respiratory condition prohibitive for safe eating at this time.  Will f/u next date.    Alanna Storti L. Tivis Ringer, [place] CCC/SLP Pager [phone]  Juan Quam Laurice 06/17/2014, 9:43 AM

## 2014-06-17 NOTE — Progress Notes (Signed)
Spoke with Dr Tana Coast regarding persistent tachypnea, and daughters questions about how to make patient more comfortable. The daughter and Dr Tana Coast spoke over the phone and per Dr Tana Coast, the decision was made to pursue comfort care for Rita Miller. Foley inserted for comfort, pt given fentanyl and ativan that was ordered.  Dr Tana Coast to confer with palliative about better pain control as needed since pt allergic to Morphine.    Carol Ada, RN

## 2014-06-18 DIAGNOSIS — A414 Sepsis due to anaerobes: Secondary | ICD-10-CM | POA: Diagnosis present

## 2014-06-18 DIAGNOSIS — R0689 Other abnormalities of breathing: Secondary | ICD-10-CM

## 2014-06-18 DIAGNOSIS — I5033 Acute on chronic diastolic (congestive) heart failure: Secondary | ICD-10-CM | POA: Diagnosis present

## 2014-06-18 DIAGNOSIS — R7881 Bacteremia: Secondary | ICD-10-CM

## 2014-06-18 DIAGNOSIS — R0600 Dyspnea, unspecified: Secondary | ICD-10-CM

## 2014-06-18 DIAGNOSIS — I350 Nonrheumatic aortic (valve) stenosis: Secondary | ICD-10-CM

## 2014-06-18 DIAGNOSIS — F0390 Unspecified dementia without behavioral disturbance: Secondary | ICD-10-CM | POA: Diagnosis present

## 2014-06-18 DIAGNOSIS — I481 Persistent atrial fibrillation: Secondary | ICD-10-CM

## 2014-06-18 DIAGNOSIS — Z515 Encounter for palliative care: Secondary | ICD-10-CM

## 2014-06-18 LAB — PREPARE FRESH FROZEN PLASMA
UNIT DIVISION: 0
Unit division: 0

## 2014-06-18 LAB — CBC
HEMATOCRIT: 26.7 % — AB (ref 36.0–46.0)
Hemoglobin: 8.7 g/dL — ABNORMAL LOW (ref 12.0–15.0)
MCH: 30.6 pg (ref 26.0–34.0)
MCHC: 32.6 g/dL (ref 30.0–36.0)
MCV: 94 fL (ref 78.0–100.0)
Platelets: 121 10*3/uL — ABNORMAL LOW (ref 150–400)
RBC: 2.84 MIL/uL — AB (ref 3.87–5.11)
RDW: 16.5 % — ABNORMAL HIGH (ref 11.5–15.5)
WBC: 28.2 10*3/uL — AB (ref 4.0–10.5)

## 2014-06-18 LAB — HEPARIN LEVEL (UNFRACTIONATED): Heparin Unfractionated: <0.1 IU/mL — ABNORMAL LOW (ref 0.30–0.70)

## 2014-06-18 LAB — GLUCOSE, CAPILLARY: GLUCOSE-CAPILLARY: 149 mg/dL — AB (ref 70–99)

## 2014-06-18 LAB — PROCALCITONIN: Procalcitonin: 12.56 ng/mL

## 2014-06-18 LAB — BASIC METABOLIC PANEL
Anion gap: 19 — ABNORMAL HIGH (ref 5–15)
BUN: 130 mg/dL — AB (ref 6–23)
CHLORIDE: 113 meq/L — AB (ref 96–112)
CO2: 14 meq/L — AB (ref 19–32)
CREATININE: 1.75 mg/dL — AB (ref 0.50–1.10)
Calcium: 7.8 mg/dL — ABNORMAL LOW (ref 8.4–10.5)
GFR calc Af Amer: 30 mL/min — ABNORMAL LOW (ref >90)
GFR calc non Af Amer: 26 mL/min — ABNORMAL LOW (ref >90)
Glucose, Bld: 140 mg/dL — ABNORMAL HIGH (ref 70–99)
POTASSIUM: 6 meq/L — AB (ref 3.7–5.3)
Sodium: 146 mEq/L (ref 137–147)

## 2014-06-18 LAB — PROTIME-INR
INR: 1.58 — AB (ref 0.00–1.49)
Prothrombin Time: 19.1 seconds — ABNORMAL HIGH (ref 11.6–15.2)

## 2014-06-18 MED ORDER — BISACODYL 10 MG RE SUPP: 10.0000 mg | Freq: Every day | RECTAL | Status: DC | PRN

## 2014-06-18 MED ORDER — FENTANYL CITRATE 0.05 MG/ML IJ SOLN
50.0000 ug | INTRAMUSCULAR | Status: DC | PRN
  Administered 2014-06-19: 50 ug via INTRAVENOUS
  Filled 2014-06-18 (×2): qty 2

## 2014-06-18 MED ORDER — ATROPINE SULFATE 1 % OP SOLN
4.0000 [drp] | OPHTHALMIC | Status: DC | PRN
  Filled 2014-06-18: qty 2

## 2014-06-18 MED ORDER — IPRATROPIUM BROMIDE 0.02 % IN SOLN: 0.5000 mg | RESPIRATORY_TRACT | Status: DC | PRN

## 2014-06-18 MED ORDER — LEVALBUTEROL HCL 0.63 MG/3ML IN NEBU
0.6300 mg | INHALATION_SOLUTION | RESPIRATORY_TRACT | Status: DC | PRN
  Filled 2014-06-18: qty 3

## 2014-06-18 NOTE — Progress Notes (Signed)
Nutrition Brief Note  Chart reviewed. Pt now transitioning to comfort care.  No further nutrition interventions warranted at this time.  Please re-consult as needed.   Selwyn Reason A. Acy Orsak, RD, LDN Pager: [phone] After hours Pager: [phone]  

## 2014-06-18 NOTE — Discharge Summary (Signed)
Summary  Joye Wesenberg UTM:[id number] DOB: 08/20/1929 DOA: 06/16/2014  PCP: Penni Homans, MD PCP/Office notified: TBD  Admit date: 06/10/2014  History of present illness:  Patient is an 80 year old female with past history true fibrillation, CAD, hypothyroidism, diastolic heart failure and severe aortic stenosis who was admitted on11/6 from her nursing home with shortness of breath and fever. Workup in the emergency room led to patient being admitted for healthcare associated pneumonia causing sepsis, CHF exacerbation and UTI plus atrial fibrillation with rapid ventricular rate.    Hospital Course:   Exam: Breathing is agonal. Cardiovascular: Irregular rhythm, borderline tachycardia Principal Problem:   Sepsis/  Bacteroides fragilis infection from healthcare associated pneumonia: Patient's blood cultures positive for gram-negative rods, urine cultures noting multiple bacterial Morphis sites. Patient has showed broad-spectrum antibiotics which were narrowed down and started on ertapenem and vancomycin discontinued. Infectious disease consult did. Patient initially tachypneic requiring nonrebreather mask. Lasix given to treat pleural effusions and improved breathing. Patient has had a number of admissions as of late. With no significant improvement in her respiratory status not tolerating BiPAP or nonrebreather very well, patient's daughter elected to make the patient comfort care.With comfort care, antibiotics discontinued. Started on medication for pain and scopolamine patch. Palliative care consult. Oral medications discontinued. Active Problems:   Hypothyroidism: Initially on Synthroid prior to comfort care    Iron deficiency anemia   Severe aortic stenosis: seen by cardiology. Not a candidate for aortic valve repair given her multiple medical problems, age and comorbidities.    MITRAL VALVE REPLACEMENT, HX OF: Status post pacemaker    PVD (peripheral vascular disease)   COPD (chronic  obstructive pulmonary disease): Initially wheezing treated with Xopenex and Atrovent nebulizers, on Pulmicort. Now on oxygen as needed for support for comfort care    Protein-calorie malnutrition, severe: Patient is criteria in the context of chronic illness as evidenced by 28% weight loss 6 months, severe fat and muscle depletion. Seen by nutrition. Initially started on 30 ML's of pro-stat 3 times a day plus Magic cup 3 times a day with meals. This regimen was discontinued once patient was made comfort care.    HCAP (healthcare-associated pneumonia)    AKI (acute kidney injury)colon to be prerenal from dehydration. Resolved with IV fluids.    Atrial fibrillation with RVR: Initially managed with Cardizem and on heparin,discontinue the patient is Comfort Care    Supratherapeutic INR: Initially felt to be secondary to decreased clearance of Coumadin secondary to volume overload. Improved with diuresis. On heparin drip. Now discontinued secondary to comfort care.    UTI (lower urinary tract infection)  Acute on chronic diastolic heart failure: 2-D echo done 7/15 noted EF of 55% with moderate LVH and very severe aortic stenosis. Diuresis attempted. Cardiology on board. Patient now comfort care.      Sacral decubitus ulcer: stage II: Initially doing wound care, now discontinued now the patient is comfort care     Abdominal pain in the setting ofRenal abscess: patient had a repeat CT done 11/9 noting right hydronephrosis with obstruction at UPJ and was felt to have a large 9 x 4 x 14 cm renal abscess. Perfusion was felt to be a possible reactive effusion. Interventional radiology consult at initially had plans for a perfect drain and nephrostomy tube, now that patient is comfort care, this has been canceled    Acute respiratory failure with hypoxiain the setting of bilateral pleural effusions from acute on chronic diastolic heart failure plus healthcare associated pneumonia.: : With lack  of  improvement, may comfort care. As above.   Time: 25 minutes  Signed:  Annita Brod  [facility] 06/18/2014, 1:46 PM

## 2014-06-18 NOTE — Progress Notes (Signed)
SUBJECTIVE:  Pt seen and examined in AM. She was given fentanyl and ativan last night. She continues to have agonal breathing today and now with acute renal failure (K: 6). Spoke with daughter who wants comfort care measures. Palliative care to see patient. She is currently on non-rebreather. She remains in persistent afib, now becoming hypotensive. .   OBJECTIVE:   Vitals:   Filed Vitals:   06/18/14 0423 06/18/14 0748 06/18/14 0800 06/18/14 0903  BP:  102/28 113/34   Pulse:  97 97   Temp:  98 F (36.7 C)    TempSrc:  Oral    Resp:  23 24   Height:      Weight: 61 lb 11.7 oz (28 kg)     SpO2:  97% 96% 97%   I&O's:    Intake/Output Summary (Last 24 hours) at 06/18/14 1130 Last data filed at 06/18/14 1000  Gross per 24 hour  Intake 442.29 ml  Output    615 ml  Net -172.71 ml   TELEMETRY: Reviewed telemetry pt in atrial fibrillation:   PHYSICAL EXAM General: Frail and cachetic appearing. Head:   Normal cephalic and atramatic  Lungs:  Agonal breathing on non-rebreather. Anterior fields with ronchi. .  Heart:  Irregularly irregular rhythm with normal HR. Grade III/VI late peaking harsh crescendo-decrescendo systolic murmur best heard at right upper sternal border.  Abdomen: Abdomen soft and non-tender Extremities:  Trace b/l edema.   Neuro: Obtunded Skin: Multiple skin bruises   LABS: Basic Metabolic Panel:  Recent Labs  06/17/14 0246 06/18/14 0326  NA 143 146  K 4.9 6.0*  CL 114* 113*  CO2 11* 14*  GLUCOSE 163* 140*  BUN 101* 130*  CREATININE 1.18* 1.75*  CALCIUM 8.3* 7.8*   Liver Function Tests: No results for input(s): AST, ALT, ALKPHOS, BILITOT, PROT, ALBUMIN in the last 72 hours. No results for input(s): LIPASE, AMYLASE in the last 72 hours. CBC:  Recent Labs  06/17/14 0246 06/18/14 0326  WBC 26.6* 28.2*  HGB 9.2* 8.7*  HCT 27.0* 26.7*  MCV 89.7 94.0  PLT 144* 121*   Cardiac Enzymes: No results for input(s): CKTOTAL, CKMB, CKMBINDEX,  TROPONINI in the last 72 hours. BNP: Invalid input(s): POCBNP D-Dimer: No results for input(s): DDIMER in the last 72 hours. Hemoglobin A1C: No results for input(s): HGBA1C in the last 72 hours. Fasting Lipid Panel: No results for input(s): CHOL, HDL, LDLCALC, TRIG, CHOLHDL, LDLDIRECT in the last 72 hours. Thyroid Function Tests: No results for input(s): TSH, T4TOTAL, T3FREE, THYROIDAB in the last 72 hours.  Invalid input(s): FREET3 Anemia Panel: No results for input(s): VITAMINB12, FOLATE, FERRITIN, TIBC, IRON, RETICCTPCT in the last 72 hours. Coag Panel:   Lab Results  Component Value Date   INR 1.58* 06/18/2014   INR 1.59* 06/17/2014   INR 1.60* 06/16/2014    RADIOLOGY: Ct Abdomen Pelvis Wo Contrast  06/16/2014   CLINICAL DATA:  Abdominal pain.  Sepsis and bacteremia.  EXAM: CT ABDOMEN AND PELVIS WITHOUT CONTRAST  TECHNIQUE: Multidetector CT imaging of the abdomen and pelvis was performed following the standard protocol without IV contrast.  COMPARISON:  06/14/2014  FINDINGS: BODY WALL: Anasarca  LOWER CHEST: Small right pleural effusion which is increased from before. Scalloped appearance of the atelectatic right lower lobe suggest complexity, especially in light of neighboring subdiaphragmatic fluid collection. There is extensive debris within the left lower lobe bronchi, with the visualize left lower lobe collapse.  Cardiomegaly.  Extensive aortic valve sclerosis/calcification.  ABDOMEN/PELVIS:  Liver: No focal abnormality.  Biliary: No evidence of biliary obstruction or stone.  Pancreas: Unremarkable.  Spleen: Unremarkable.  Adrenals: Unremarkable.  Kidneys and ureters: There is advanced hydronephrosis on the right with abrupt transition at the UPJ. Noted history of congenital UPJ obstruction and right hydronephrosis, but the degree of collecting system dilatation is significantly increased from 01/20/2014. No stone or other discrete obstructive process identified. There is a right  posterior para renal fluid collection which has a discrete rim inferiorly, with continuation superiorly to the subdiaphragmatic retroperitoneum. The collection measures 9 x 4 x 14 cm in maximal dimension. Left renal cysts again noted. Tiny high-density lesion in the upper pole left kidney is likely a hemorrhagic cyst. No left urinary obstruction.  Bladder: Moderate distention. Discrete fluid collection right of the bladder is a diverticulum with fluctuating size on recent imaging.  Reproductive: Unremarkable for age.  Bowel: Prominent colonic stool, especially in the rectum. There are no associated inflammatory changes. Severe distal colonic diverticulosis. No pericecal inflammatory changes.  Retroperitoneum: No mass or adenopathy.  Peritoneum: No ascites or pneumoperitoneum.  Vascular: Extensive atherosclerosis. There is a saccular aneurysm directed to the right of the upper abdominal aorta which is predominately thrombosed based on infused CT 01/20/2014. This peripherally calcified aneurysm measures 22 mm in diameter. Fusiform aneurysmal enlargement of the infrarenal aorta, up to 4.2 cm in diameter. No new displacement of intimal calcification or surrounding fat stranding to suggest rupture or leakage.  OSSEOUS: Total right hip arthroplasty. Status post left femoral screw. Visible portions of the hardware are unremarkable. Remote superior endplate fracture of L1.  These results were called by telephone at the time of interpretation on 06/16/2014 at 5:36 pm to Dr. Nira Conn RAI , who verbally acknowledged these results.  IMPRESSION: 1. Right hydronephrosis with obstruction at the UPJ, cause uncertain. There is a 9 x 4 x 14 cm right posterior pararenal fluid collection, likely abscess given the clinical circumstances. 2. Small but increasing right pleural effusion with complex features. This could represent empyema or reactive effusion. 3. Left lower lobe collapse. There is extensive endobronchial debris, question  aspiration. 4. Right bladder diverticulum. 5. Saccular and fusiform abdominal aortic aneurysms, as above.   Electronically Signed   By: Jorje Guild M.D.   On: 06/16/2014 17:39   Ct Abdomen Pelvis Wo Contrast  06/14/2014   CLINICAL DATA:  Fever and shortness of breath.  Abdominal pain.  EXAM: CT ABDOMEN AND PELVIS WITHOUT CONTRAST  TECHNIQUE: Multidetector CT imaging of the abdomen and pelvis was performed following the standard protocol without IV contrast.  COMPARISON:  01/20/2014  FINDINGS: Small bilateral pleural effusions with basilar atelectasis or consolidation. Extensive vascular calcifications in the Chest involving aorta and Coronary arteries. Cardiac pacemaker.  Unenhanced appearance of the liver, spleen, gallbladder, pancreas, adrenal glands, inferior vena cava, and retroperitoneal lymph nodes is unremarkable except for vascular calcifications. Since the previous study, there is new development of fluid around the liver and right kidney suggesting ascites or reactive edema. There is marked right renal hydronephrosis without definite pyelocaliectasis. Ureteral obstruction is suspected but causes not confirmed on this study. Visualization of the low pelvis and distal ureter is limited due to streak artifact from bilateral hip hardware. Multiple parenchymal lesions in both kidneys probably representing small cysts although too small to characterize. These were present previously. There is extensive calcification of the aorta and iliac arteries. Abdominal aortic aneurysm measuring 3.8 cm maximal AP diameter. Eccentric saccular aneurysm arising to the right of  the upper abdominal aorta and measuring 2.1 x 1.7 cm diameter. This is similar to previous study. Calcification in the internal and external iliac arteries bilaterally likely represent significant stenosis. Stomach and small bowel do not appear abnormally distended. Diffusely stool-filled colon likely indicating constipation. No free air in the  abdomen.  Pelvis: Limited visualization due to surgical hardware in the hips. Visualized bladder wall is not thickened. Prominent stool in the rectosigmoid colon likely indicating constipation. Colonic diverticula are present. Appendix is not identified. Suggestion of focal fluid collection in the right pelvis of nonspecific etiology. This measures about 2.9 cm diameter. This could represent ovarian cyst. Uterus is not identified and may be surgically absent or atrophic. Calcified granulomas in the subcutaneous fat over the gluteal regions consistent with injection granulomas. Diffuse bone demineralization. Endplate compression fractures at L1. No change in appearance of the bones since the previous study.  IMPRESSION: Small bilateral pleural effusions with basilar atelectasis or consolidation bilaterally. New free fluid in the right upper quadrant which may indicate ascites or reactive fluid. Marked hydronephrosis of the right kidney. Etiology is not determined. Abdominal aortic aneurysm with additional eccentric saccular aneurysm. No change since prior study. Extensive vascular calcifications with likely vascular compromise in the iliac arteries bilaterally. Small fluid collection in the right pelvis of nonspecific etiology.   Electronically Signed   By: Lucienne Capers M.D.   On: 06/14/2014 01:18   Dg Chest Port 1 View  06/08/2014   CLINICAL DATA:  Shortness of breath and fever for 1 day  EXAM: PORTABLE CHEST - 1 VIEW  COMPARISON:  03/07/2014  FINDINGS: Cardiac pacemaker. Postoperative changes in the right breast and right axilla. Mild cardiac enlargement. Shallow inspiration with infiltration or atelectasis in the lung bases. This is increasing since previous study. No pneumothorax. Calcified and tortuous aorta with prominent pulmonary outflow tract.  IMPRESSION: Bilateral pleural effusions with basilar atelectasis or infiltration, increasing since previous study. Mild cardiac enlargement with prominent  pulmonary outflow tract   Electronically Signed   By: Lucienne Capers M.D.   On: 06/19/2014 21:22   Dg Abd Portable 1v  06/16/2014   CLINICAL DATA:  Abdominal pain, fever and elevated white blood cell count.  EXAM: PORTABLE ABDOMEN - 1 VIEW  COMPARISON:  03/05/2014 and CT on 06/14/2014.  FINDINGS: No bowel obstruction identified. There is moderate fecal material in the distal colon and rectum. Vascular calcifications are seen involving the abdominal aorta and iliac arteries with indwelling left iliac arterial stent present.  IMPRESSION: No evidence of bowel obstruction.   Electronically Signed   By: Aletta Edouard M.D.   On: 06/16/2014 08:21   Principal Problem:   Sepsis Active Problems:   Hypothyroidism   Iron deficiency anemia   Severe aortic stenosis   Atrial fibrillation   MITRAL VALVE REPLACEMENT, HX OF   PACEMAKER-[employer]   PVD (peripheral vascular disease)   COPD (chronic obstructive pulmonary disease)   Protein-calorie malnutrition, severe   HCAP (healthcare-associated pneumonia)   AKI (acute kidney injury)   Atrial fibrillation with RVR   Abdominal pain   Blood poisoning   Supratherapeutic INR   UTI (lower urinary tract infection)   Gram-negative bacteremia   Severe malnutrition   Sacral decubitus ulcer   Bacteroides fragilis infection   Renal abscess   Acute respiratory failure with hypoxia     ASSESSMENT: 80 yo woman with CAD, CVA, PE, Afib on coumadin, PVD, COPD, hypothyroidism, and dementia who presented with severe sepsis found to have GNR bacteremia  due to large right retroperitoneum and paranephric abscess who went into rapid afib.   PLAN:   Multisystem Organ Failure due to Bacteroides fragilis bacteremia from large right retroperitoneum and paranephric abscess -Pt's daughter has decided on full comfort measures. Palliative care team following. Long Standing Persistent Atrial Fibrillation - Pt now on full comfort care. Severe Aortic Stenosis - Pt not  surgical candidate. 2D-echo in July with EF 55%, very severe aortic stenosis with heavy calcification, and mild AI.  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  Will sign off.   Juluis Mire, MD  PGY-II IMTS Pager: [phone] 06/18/2014  11:30 AM  Attending note to follow.   I saw the patient this AM & acknowledge plans for Comfort Care. Unfortunately, I believe that this is the correct coarse of action in her condition.  We will sign off.  Leonie Man, M.D., M.S. Interventional Cardiologist   Pager # [phone]

## 2014-06-18 NOTE — Progress Notes (Signed)
ANTICOAGULATION CONSULT NOTE - Follow Up Consult  Pharmacy Consult for Heparin  Indication: atrial fibrillation  Allergies  Allergen Reactions  . Donepezil Hydrochloride Palpitations and Other (See Comments)    REACTION: heart fluttering  . Oxycodone-Acetaminophen Other (See Comments)    REACTION: Hallucinations  . Morphine Sulfate Other (See Comments)    unknown    Patient Measurements: Height: 4\' 11"  (149.9 cm) Weight: 68 lb 12.5 oz (31.2 kg) IBW/kg (Calculated) : 43.2  Vital Signs: Temp: 97 F (36.1 C) (11/10 2000) Temp Source: Oral (11/10 2000) BP: 86/39 mmHg (11/11 0000) Pulse Rate: 75 (11/11 0000)  Labs:  Recent Labs  06/15/14 0952  06/16/14 0306 06/16/14 1907 06/16/14 2103 06/17/14 0246 06/17/14 1300 06/17/14 2340  HGB 9.3*  --  9.3*  --   --  9.2*  --   --   HCT 27.5*  --  27.3*  --   --  27.0*  --   --   PLT 171  --  185  --   --  144*  --   --   LABPROT >90.0*  < > 80.6* 21.2* 19.2* 19.1*  --   --   INR >10.00*  < > >10.00* 1.82* 1.60* 1.59*  --   --   HEPARINUNFRC  --   --   --   --   --  <0.10* <0.10* <0.10*  CREATININE 1.39*  --  1.24*  --   --  1.18*  --   --   < > = values in this interval not displayed.  Estimated Creatinine Clearance: 18.1 mL/min (by C-G formula based on Cr of 1.18).   Assessment: Sub-therapeutic heparin level, looks like taking comfort care approach (RN already called cardiology to ask about heparin drip continuation).   Goal of Therapy:  Heparin level 0.3-0.7 units/ml Monitor platelets by anticoagulation protocol: Yes   Plan:  -Increase heparin drip to 750 units/hr -0900 HL -Daily CBC/HL -Monitor for bleeding  Narda Bonds 06/18/2014,1:16 AM

## 2014-06-18 NOTE — Progress Notes (Signed)
   06/18/14 1600  Clinical Encounter Type  Visited With Health care provider  Visit Type Initial  Referral From Nurse   Chaplain was referred to patient via spiritual care consult. Consult indicated that the patient was actively dying and the patient's daughter needed grief support. Chaplain has attempted to visit with patient's daughter twice but nurses said she went for a walk both times. If patient's daughter needs grief support, page On-Call chaplain pager.   Gar Ponto, Chaplain  4:06 PM

## 2014-06-18 NOTE — Progress Notes (Signed)
Full note to follow:  Rita Miller and daughter, Rita Miller, are well known to me from past admissions. Rita Miller is much declined this admission and Rita Miller acknowledges that she knows that there is not much we can do this time to reverse her condition. With this knowledge Rita Miller wishes for full comfort and does not want to prolong Rita Miller's inevitable death. Rita Miller says she has already told her mother is was okay to let go. Rita Miller says after her mothers last stroke she has taken much time off work to be with her and that Rita Miller was no longer able to walk and quality of life was very poor. Rita Miller has known that this time is coming from our past conversations and appears to be at peace with the decision for comfort. I will continue to follow and support.   [name], NP Palliative Medicine Team Pager # [phone] (M-F 8a-5p) Team Phone # [phone] (Nights/Weekends)

## 2014-06-18 NOTE — Consult Note (Signed)
Patient [id number] Bossard      DOB: April 06, 1930      ZLD:[id number]     Consult Note from the Palliative Medicine Team at [facility] Requested by: Dr. Tana Coast     PCP: Penni Homans, MD Reason for Consultation: [place]     Phone Number:[phone]  Assessment of patients Current state: Rita Miller and her daughter, Rita Miller, are well known to me from past admissions. Rita Miller is much declined this admission and Rita Miller acknowledges that she knows that there is not much we can do this time to reverse her condition. With this knowledge Rita Miller wishes for full comfort and does not want to prolong Rita Miller's inevitable death. We discussed d/c heparin and cardizem infusions and nonrebreather as Rita Miller states she does not want her to be prolonged in this state. Rita Miller says she has already told her mother is was okay to go. Rita Miller says after her mothers last stroke she has taken much time off work to be with her and that Rita Miller was no longer able to walk and quality of life was very poor. Rita Miller has known that this time is coming from our past conversations and appears to be at peace with the decision for comfort. She was very devoted to her mother and would bring her to her home on the weekends to be with her at her home. I will continue to follow and support.    Goals of Care: 1.  Code Status: DNR   2. Scope of Treatment: 1. Vital Signs: daily 2. Respiratory/Oxygen: for comfort only 3. Nutritional Support/Tube Feeds: no 4. Antibiotics: no 5. Blood Products: no 6. IVF: no 7. Review of Medications to be discontinued: minimize for comfort 8. Labs: no 9. Telemetry: no 10. Consults: Chaplain for support.    4. Disposition: Anticipate hospital death.    3. Symptom Management:   1. Pain/dyspnea: Fentanyl 50 mcg every 1 hour prn.  2. Anxiety: Lorazepam 1 mg every 4 hours prn.  3. Secretions: Scopolamine patch. Atropine drops SL prn. 4. Nausea: Ondansetron prn. 5. Constipation: Dulcolax supp daily  prn.    4. Psychosocial: Emotional support provided to patient and children at bedside.   5. Spiritual: Chaplain consulted for support.    Brief HPI: 80 yo female admitted with shortness of breath and treated for septic HCAP and had abdominal pain with renal abscess but not in a position to tolerate intervention for treatment. She is also cachectic with h/o strokes and end-stage dementia. PMH for stroke, dementia, hypothyroidism, iron deficiency anemia, mitral valve replacement, CAD, PVD, COPD, severe protein calorie malnutrition, acute kidney injury, atrial fibrillation, UTI, sacral decubitus, PE, LBBB, tachy-brady syndrome s/p pacemaker, HTN, esophageal reflux, arthritis, h/o breast cancer.    ROS: + respiratory distress    PMH:  Past Medical History  Diagnosis Date  . CAD (coronary artery disease)   . History of CVA (cerebrovascular accident)   . Atrial fibrillation     peristent  . Hyperlipemia   . COPD (chronic obstructive pulmonary disease)   . Epistaxis     recurrent  . Osteoporosis   . Hypothyroidism   . History of pulmonary embolism   . History of colonic diverticulitis   . History of breast cancer   . Asthma   . Iron deficiency anemia   . History of transfusion of whole blood   . LBBB (left bundle branch block)     history of  . Aortal stenosis  severe  . Pacemaker     bradycardia s/p pacemaker implant  . PAD (peripheral artery disease)     left leg occluded viabahn stents x 3 left SFA  . Tachycardia-bradycardia syndrome     s/p PPM  . Cancer   . Arthritis   . Stroke   . HTN (hypertension) 09/25/2012  . Esophageal reflux 01/26/2014  . Left-sided visual neglect 03/03/2014     PSH: Past Surgical History  Procedure Laterality Date  . Thyroidectomy      due to cancer  . Total hip arthroplasty    . Abdominal hysterectomy    . Mitral valve repair    . Intraocular lens insertion      left eye implant  . Pacemaker placement      for tachy/brady  syndrome  . Mastectomy     I have reviewed the FH and SH and  If appropriate update it with new information. Allergies  Allergen Reactions  . Donepezil Hydrochloride Palpitations and Other (See Comments)    REACTION: heart fluttering  . Oxycodone-Acetaminophen Other (See Comments)    REACTION: Hallucinations  . Morphine Sulfate Other (See Comments)    unknown   Scheduled Meds: . antiseptic oral rinse  7 mL Mouth Rinse q12n4p  . chlorhexidine  15 mL Mouth Rinse BID  . furosemide  40 mg Intravenous Daily  . levalbuterol  0.63 mg Nebulization QID   And  . ipratropium  0.5 mg Nebulization QID  . pantoprazole (PROTONIX) IV  40 mg Intravenous Q12H  . scopolamine  1 patch Transdermal Q72H   Continuous Infusions: . sodium chloride Stopped (06/17/14 0900)   PRN Meds:.sodium chloride, atropine, bisacodyl, fentaNYL, LORazepam, ondansetron    BP 102/28 mmHg  Pulse 97  Temp(Src) 98 F (36.7 C) (Oral)  Resp 23  Ht 4\' 11"  (1.499 m)  Wt 28 kg (61 lb 11.7 oz)  BMI 12.46 kg/m2  SpO2 97%   PPS: 10%   Intake/Output Summary (Last 24 hours) at 06/18/14 0906 Last data filed at 06/18/14 0600  Gross per 24 hour  Intake 549.08 ml  Output    585 ml  Net -35.92 ml    Physical Exam:  General: Cachectic, unresponsive, no signs of pain HEENT: Severe muscle wasting, moist mucous membranes Chest: Tachypnea, slightly labored, very shallow and agonal CVS: Irreg, S1, S2 Abdomen: Soft, NT Ext: Bilat feet cool to the touch with mottling Neuro: Unresponsive  Labs: CBC    Component Value Date/Time   WBC 28.2* 06/18/2014 0326   WBC 6.6 08/30/2013 1049   RBC 2.84* 06/18/2014 0326   RBC 3.64* 08/30/2013 1049   RBC 3.25* 09/21/2011 1402   HGB 8.7* 06/18/2014 0326   HGB 11.2* 08/30/2013 1049   HCT 26.7* 06/18/2014 0326   HCT 35.9 08/30/2013 1049   PLT 121* 06/18/2014 0326   PLT 233 08/30/2013 1049   MCV 94.0 06/18/2014 0326   MCV 99 08/30/2013 1049   MCH 30.6 06/18/2014 0326   MCH  30.8 08/30/2013 1049   MCHC 32.6 06/18/2014 0326   MCHC 31.2* 08/30/2013 1049   RDW 16.5* 06/18/2014 0326   RDW 14.9 08/30/2013 1049   LYMPHSABS 0.5* 06/14/2014 0530   LYMPHSABS 1.0 08/30/2013 1049   MONOABS 0.7 06/14/2014 0530   EOSABS 0.0 06/14/2014 0530   EOSABS 0.1 08/30/2013 1049   BASOSABS 0.0 06/14/2014 0530   BASOSABS 0.0 08/30/2013 1049    BMET    Component Value Date/Time   NA 146 06/18/2014 0326  NA 138 04/17/2014 1555   K 6.0* 06/18/2014 0326   CL 113* 06/18/2014 0326   CO2 14* 06/18/2014 0326   GLUCOSE 140* 06/18/2014 0326   GLUCOSE 96 04/17/2014 1555   BUN 130* 06/18/2014 0326   BUN 30* 04/17/2014 1555   CREATININE 1.75* 06/18/2014 0326   CREATININE 1.00 01/17/2014 1352   CALCIUM 7.8* 06/18/2014 0326   GFRNONAA 26* 06/18/2014 0326   GFRNONAA 49* 11/04/2010 1120   GFRAA 30* 06/18/2014 0326   GFRAA 59* 11/04/2010 1120    CMP     Component Value Date/Time   NA 146 06/18/2014 0326   NA 138 04/17/2014 1555   K 6.0* 06/18/2014 0326   CL 113* 06/18/2014 0326   CO2 14* 06/18/2014 0326   GLUCOSE 140* 06/18/2014 0326   GLUCOSE 96 04/17/2014 1555   BUN 130* 06/18/2014 0326   BUN 30* 04/17/2014 1555   CREATININE 1.75* 06/18/2014 0326   CREATININE 1.00 01/17/2014 1352   CALCIUM 7.8* 06/18/2014 0326   PROT 6.5 06/14/2014 0530   PROT 8.4 04/17/2014 1555   ALBUMIN 1.9* 06/14/2014 0530   AST 43* 06/14/2014 0530   ALT 17 06/14/2014 0530   ALKPHOS 108 06/14/2014 0530   BILITOT 0.9 06/14/2014 0530   GFRNONAA 26* 06/18/2014 0326   GFRNONAA 49* 11/04/2010 1120   GFRAA 30* 06/18/2014 0326   GFRAA 59* 11/04/2010 1120     Time In Time Out Total Time Spent with Patient Total Overall Time  0820 0920 3min 11min    Greater than 50%  of this time was spent counseling and coordinating care related to the above assessment and plan.  [name], NP Palliative Medicine Team Pager # [phone] (M-F 8a-5p) Team Phone # [phone] (Nights/Weekends)

## 2014-06-18 NOTE — Progress Notes (Signed)
Palliative consult and comfort care measure orders given on 11/10 for pt. Pt still on heparin gtt at this time. On-call cards MD paged to ask if we can d/c heparin. Awaiting orders.

## 2014-06-19 ENCOUNTER — Telehealth: Payer: Self-pay

## 2014-06-19 MED ORDER — SODIUM CHLORIDE 0.9 % IV SOLN
1.0000 mg/h | INTRAVENOUS | Status: DC
  Administered 2014-06-19: 1 mg/h via INTRAVENOUS
  Filled 2014-06-19: qty 10

## 2014-06-19 MED ORDER — MORPHINE BOLUS VIA INFUSION
1.0000 mg | INTRAVENOUS | Status: DC | PRN
  Filled 2014-06-19: qty 1

## 2014-06-19 NOTE — Progress Notes (Signed)
Progress Note from the Palliative Medicine Team at [facility]: Rita Miller is resting and continues to be unresponsive with her eyes open. She is tachypneic and breathing slightly labored and very shallow. Discussed morphine infusion with daughter, Rita Miller, and I asked about her morphine allergy and Rita Miller tells me that "it made her hallucinate and made her like this [I believe she means decreased mental status] just like all these heavy medications and I don't want her getting them." I reminded her that Rita Miller is not responding because she is dying and that is coming from her and not from the medications. I explained that we are giving the medications to help with her breathing so she is not in pain and suffering. She reluctantly agreed to continue fentanyl if the nurse tells her and explains why she is giving it. Updated RN.      Objective: Allergies  Allergen Reactions  . Donepezil Hydrochloride Palpitations and Other (See Comments)    REACTION: heart fluttering  . Oxycodone-Acetaminophen Other (See Comments)    REACTION: Hallucinations  . Morphine Sulfate Other (See Comments)    unknown   Scheduled Meds: . antiseptic oral rinse  7 mL Mouth Rinse q12n4p  . chlorhexidine  15 mL Mouth Rinse BID  . furosemide  40 mg Intravenous Daily  . scopolamine  1 patch Transdermal Q72H   Continuous Infusions: . sodium chloride Stopped (06/17/14 0900)  . morphine 1 mg/hr (06/19/14 1302)   PRN Meds:.sodium chloride, atropine, bisacodyl, levalbuterol **AND** ipratropium, LORazepam, morphine, ondansetron  BP 103/51 mmHg  Pulse 87  Temp(Src) 97.8 F (36.6 C) (Oral)  Resp 24  Ht 5' (1.524 m)  Wt 21.9 kg (48 lb 4.5 oz)  BMI 9.43 kg/m2  SpO2 91%   PPS: 10%   Intake/Output Summary (Last 24 hours) at Jul 10, 2014 0803 Last data filed at 2014-07-10 0524  Gross per 24 hour  Intake   5.97 ml  Output    250 ml  Net -244.03 ml       Physical Exam:  General: Cachectic, unresponsive, no  signs of pain HEENT: Severe muscle wasting, moist mucous membranes Chest: Tachypnea, slightly labored, very shallow and agonal CVS: Irreg, S1, S2 Abdomen: Soft, NT Ext: Bilat feet cool to the touch with mottling Neuro: Unresponsive  Labs: CBC    Component Value Date/Time   WBC 28.2* 06/18/2014 0326   WBC 6.6 08/30/2013 1049   RBC 2.84* 06/18/2014 0326   RBC 3.64* 08/30/2013 1049   RBC 3.25* 09/21/2011 1402   HGB 8.7* 06/18/2014 0326   HGB 11.2* 08/30/2013 1049   HCT 26.7* 06/18/2014 0326   HCT 35.9 08/30/2013 1049   PLT 121* 06/18/2014 0326   PLT 233 08/30/2013 1049   MCV 94.0 06/18/2014 0326   MCV 99 08/30/2013 1049   MCH 30.6 06/18/2014 0326   MCH 30.8 08/30/2013 1049   MCHC 32.6 06/18/2014 0326   MCHC 31.2* 08/30/2013 1049   RDW 16.5* 06/18/2014 0326   RDW 14.9 08/30/2013 1049   LYMPHSABS 0.5* 06/14/2014 0530   LYMPHSABS 1.0 08/30/2013 1049   MONOABS 0.7 06/14/2014 0530   EOSABS 0.0 06/14/2014 0530   EOSABS 0.1 08/30/2013 1049   BASOSABS 0.0 06/14/2014 0530   BASOSABS 0.0 08/30/2013 1049    BMET    Component Value Date/Time   NA 146 06/18/2014 0326   NA 138 04/17/2014 1555   K 6.0* 06/18/2014 0326   CL 113* 06/18/2014 0326   CO2 14* 06/18/2014 0326   GLUCOSE  140* 06/18/2014 0326   GLUCOSE 96 04/17/2014 1555   BUN 130* 06/18/2014 0326   BUN 30* 04/17/2014 1555   CREATININE 1.75* 06/18/2014 0326   CREATININE 1.00 01/17/2014 1352   CALCIUM 7.8* 06/18/2014 0326   GFRNONAA 26* 06/18/2014 0326   GFRNONAA 49* 11/04/2010 1120   GFRAA 30* 06/18/2014 0326   GFRAA 59* 11/04/2010 1120    CMP     Component Value Date/Time   NA 146 06/18/2014 0326   NA 138 04/17/2014 1555   K 6.0* 06/18/2014 0326   CL 113* 06/18/2014 0326   CO2 14* 06/18/2014 0326   GLUCOSE 140* 06/18/2014 0326   GLUCOSE 96 04/17/2014 1555   BUN 130* 06/18/2014 0326   BUN 30* 04/17/2014 1555   CREATININE 1.75* 06/18/2014 0326   CREATININE 1.00 01/17/2014 1352   CALCIUM 7.8* 06/18/2014  0326   PROT 6.5 06/14/2014 0530   PROT 8.4 04/17/2014 1555   ALBUMIN 1.9* 06/14/2014 0530   AST 43* 06/14/2014 0530   ALT 17 06/14/2014 0530   ALKPHOS 108 06/14/2014 0530   BILITOT 0.9 06/14/2014 0530   GFRNONAA 26* 06/18/2014 0326   GFRNONAA 49* 11/04/2010 1120   GFRAA 30* 06/18/2014 0326   GFRAA 59* 11/04/2010 1120    Assessment and Plan: 1. Code Status: DNR 2. Symptom Control: 1. Pain/dyspnea: Continue prn fentanyl. I recommend low dose morphine infusion if daughter agrees. 2. Anxiety: Lorazepam 1 mg every 4 hours prn.  3. Secretions: Scopolamine patch. Atropine drops SL prn. 4. Nausea: Ondansetron prn. 5. Constipation: Dulcolax supp daily prn.  3. Psycho/Social: Emotional support provided to both patient's daughters at bedside.  4. Disposition: Anticipate hospital death.     Time In Time Out Total Time Spent with Patient Total Overall Time  0920 0940 75min 27min    Greater than 50%  of this time was spent counseling and coordinating care related to the above assessment and plan.   [name], NP Palliative Medicine Team Pager # [phone] (M-F 8a-5p) Team Phone # [phone] (Nights/Weekends)

## 2014-06-19 NOTE — Telephone Encounter (Signed)
Spoke with Lisa,RN at [facility].  She shared that Rita Miller is still in the hospital at [facility] and she is unsure whether or not she will return back to [facility] given her condition.

## 2014-06-19 NOTE — Progress Notes (Signed)
Patient seen this morning. No change from yesterday. Minimal responsiveness.  Breathing still labored. Both daughters present at the bedside. Daughter who has been more involved in patient's care resistant to morphine drip to ease work of breathing.  Continue comfort care. Although patient is actively dying, she may not pass in the next 24 hours, so we'll move to MedSurg bed for privacy and less interruptions. Palliative care following. Appreciate efforts.

## 2014-06-19 NOTE — Progress Notes (Signed)
Patient prepared for Transfer to 6N room 14. Daughters at bedside. No change in patient's status. Remains unconscious, foley intact and morphine gtt infusing at 1mg /hr. All belongings sent with patient.

## 2014-06-20 DIAGNOSIS — B966 Bacteroides fragilis [B. fragilis] as the cause of diseases classified elsewhere: Secondary | ICD-10-CM

## 2014-06-20 LAB — CULTURE, BLOOD (ROUTINE X 2): Culture: NO GROWTH

## 2014-06-22 LAB — CULTURE, BLOOD (ROUTINE X 2)
CULTURE: NO GROWTH
CULTURE: NO GROWTH

## 2014-06-24 NOTE — Telephone Encounter (Signed)
Pt is deceased. 

## 2014-06-25 ENCOUNTER — Encounter: Payer: Medicare Other | Admitting: Internal Medicine

## 2014-07-08 NOTE — Progress Notes (Signed)
Pt expired at [place]. Death pronounce by L. Namir Neto and C. Karlton Lemon, Therapist, sports. Md notified and family at bedside. [place] donors called for this referral. Pt not suitable for eye,tissue and organ donation. 65 ml of Morphine wasted with Deeann Dowse, Rn. Post Mortem care done.

## 2014-07-08 NOTE — Discharge Summary (Signed)
Summary  Rita Miller TKP:[id number] DOB: 05-Aug-1930 DOA: 06-30-2014  PCP: Penni Homans, MD PCP/Office notified: Yes  Admit date: 30-Jun-2014  Date of Death: 2014/07/07  History of present illness:  Patient is an 80 year old female with past history true fibrillation, CAD, hypothyroidism, diastolic heart failure and severe aortic stenosis who was admitted on11/6 from her nursing home with shortness of breath and fever. Workup in the emergency room led to patient being admitted for healthcare associated pneumonia causing sepsis, CHF exacerbation and UTI plus atrial fibrillation with rapid ventricular rate.    Hospital Course:   Exam: Breathing is agonal. Cardiovascular: Irregular rhythm, borderline tachycardia Principal Problem:   Sepsis/  Bacteroides fragilis infection from healthcare associated pneumonia: Patient's blood cultures positive for gram-negative rods, urine cultures noting multiple bacterial Morphis sites. Patient has showed broad-spectrum antibiotics which were narrowed down and started on ertapenem and vancomycin discontinued. Infectious disease consult did. Patient initially tachypneic requiring nonrebreather mask. Lasix given to treat pleural effusions and improved breathing. Patient has had a number of admissions as of late. With no significant improvement in her respiratory status not tolerating BiPAP or nonrebreather very well, patient's daughter elected to make the patient comfort care.With comfort care, antibiotics discontinued. Started on medication for pain and scopolamine patch. Palliative care consult. Oral medications discontinued.  Pt passed away in the early morning hours of Jul 08, 2023.    Active Problems:   Hypothyroidism: Initially on Synthroid prior to comfort care    Iron deficiency anemia   Severe aortic stenosis: seen by cardiology. Not a candidate for aortic valve repair given her multiple medical problems, age and comorbidities.    MITRAL VALVE REPLACEMENT, HX  OF: Status post pacemaker    PVD (peripheral vascular disease)   COPD (chronic obstructive pulmonary disease): Initially wheezing treated with Xopenex and Atrovent nebulizers, on Pulmicort. She did not seem to tolerate high flow oxygen mask.    Protein-calorie malnutrition, severe: Patient is criteria in the context of chronic illness as evidenced by 28% weight loss 6 months, severe fat and muscle depletion. Seen by nutrition. Initially started on 30 ML's of pro-stat 3 times a day plus Magic cup 3 times a day with meals. This regimen was discontinued once patient was made comfort care.    HCAP (healthcare-associated pneumonia)    AKI (acute kidney injury)colon to be prerenal from dehydration. Resolved with IV fluids.    Atrial fibrillation with RVR: Initially managed with Cardizem and on heparin,discontinue once the patient was made Comfort Care    Supratherapeutic INR: Initially felt to be secondary to decreased clearance of Coumadin secondary to volume overload. Improved with diuresis. On heparin drip. Discontinued once made comfort care    UTI (lower urinary tract infection): Initially treated and then antibiotics stopped  Acute on chronic diastolic heart failure: 2-D echo done 7/15 noted EF of 55% with moderate LVH and very severe aortic stenosis. Diuresis attempted. Cardiology on board. Measures discontinued once made comfortable      Sacral decubitus ulcer: stage II: Initially doing wound care, discontinued once patient became comfort care     Abdominal pain in the setting ofRenal abscess: patient had a repeat CT done 11/9 noting right hydronephrosis with obstruction at UPJ and was felt to have a large 9 x 4 x 14 cm renal abscess. Perfusion was felt to be a possible reactive effusion. Interventional radiology consult at initially had plans for a perfect drain and nephrostomy tube, once patient is comfort care, this was canceled    Acute respiratory  failure with hypoxiain the setting of  bilateral pleural effusions from acute on chronic diastolic heart failure plus healthcare associated pneumonia.: : With lack of improvement, comfort care initiated.   Time: 15 minutes  Signed:  Annita Brod  [facility] 07-12-14, 9:32 AM

## 2014-07-08 DEATH — deceased

## 2014-08-12 ENCOUNTER — Encounter: Payer: Self-pay | Admitting: Family Medicine

## 2015-01-02 ENCOUNTER — Ambulatory Visit: Payer: Medicare Other | Admitting: Family

## 2015-01-02 ENCOUNTER — Encounter (HOSPITAL_COMMUNITY): Payer: Medicare Other

## 2015-03-26 IMAGING — CT CT ANGIO HEAD
2 series · 10 of 14 positions shown · non-contrast
Comparison: Head CT without contrast 01/06/2014.

CLINICAL DATA: 83-year-old female with left side weakness, slurred
speech, facial droop. Stroke symptoms. Initial encounter.

EXAM:
CT HEAD WITHOUT CONTRAST
TECHNIQUE: Contiguous axial images were obtained from the base of the skull
through the vertex without intravenous contrast.

[Series 6: headangio 2.0 j40f 1 · axial · 0.39mm/px · z∈[+1140,+1254]mm · 8 of 75 slices shown]
[im 9/75  soft-tissue]
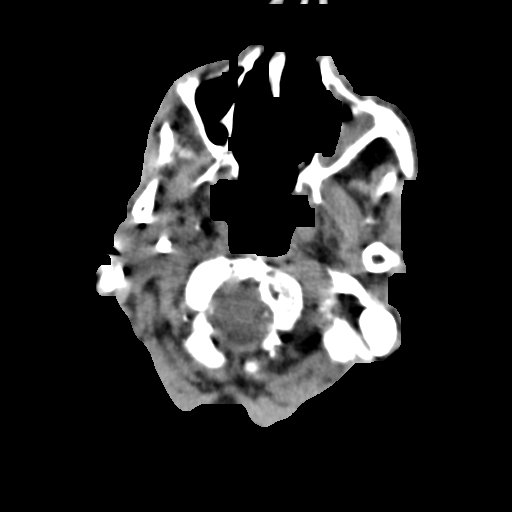
[im 17/75  bone]
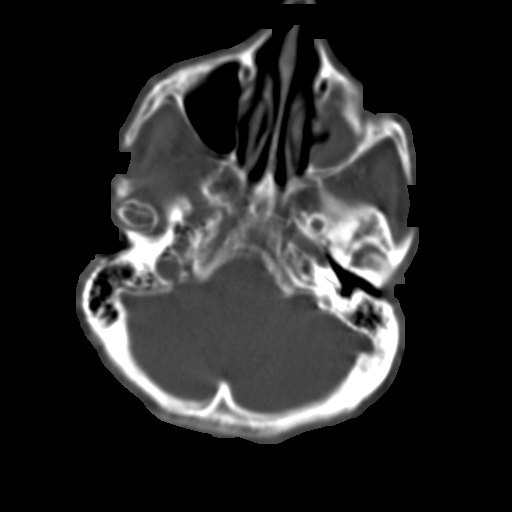
[im 25/75  soft-tissue]
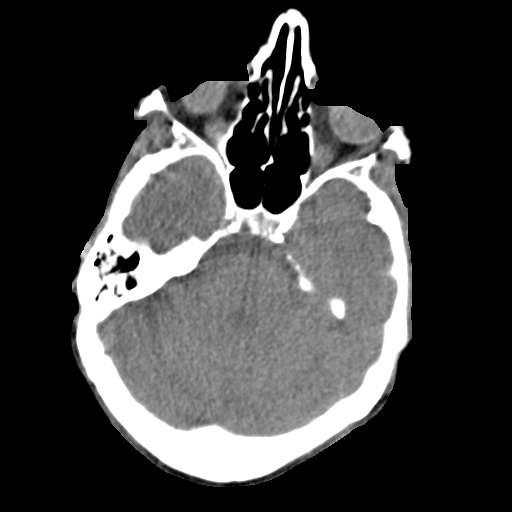
[im 33/75  bone]
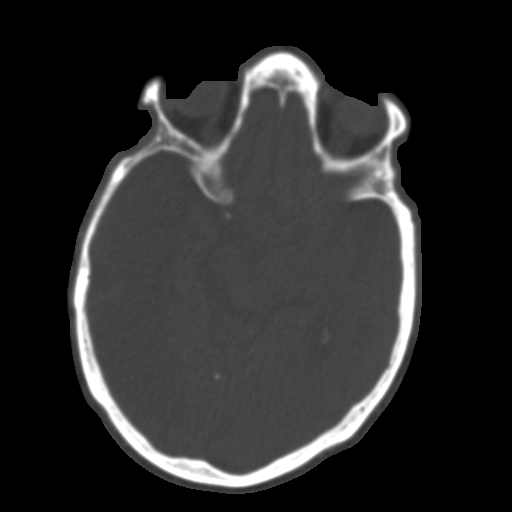
[im 42/75  soft-tissue]
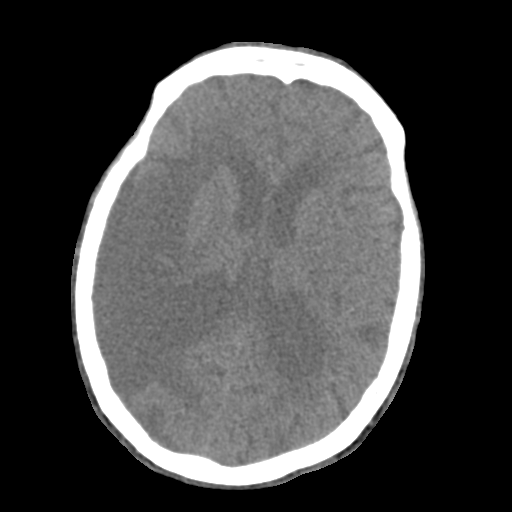
[im 50/75  bone]
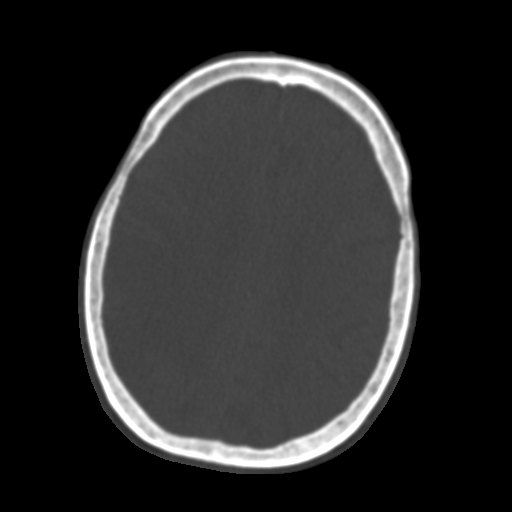
[im 58/75  soft-tissue]
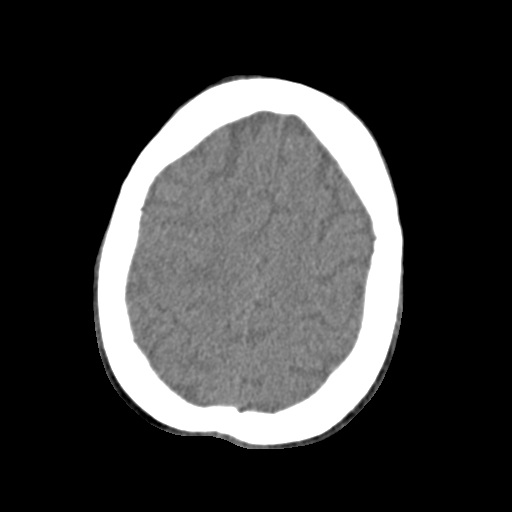
[im 66/75  bone]
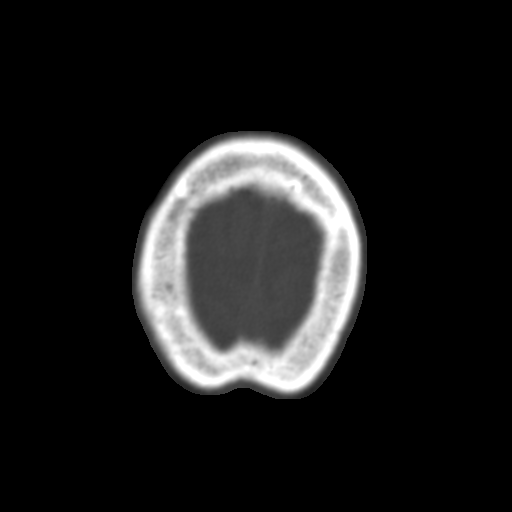

[Series 7: head w/o 5.0 h30s · axial · non-contrast · 0.42mm/px · z∈[+1203,+1248]mm · 2 of 28 slices shown]
[im 10/28  soft-tissue]
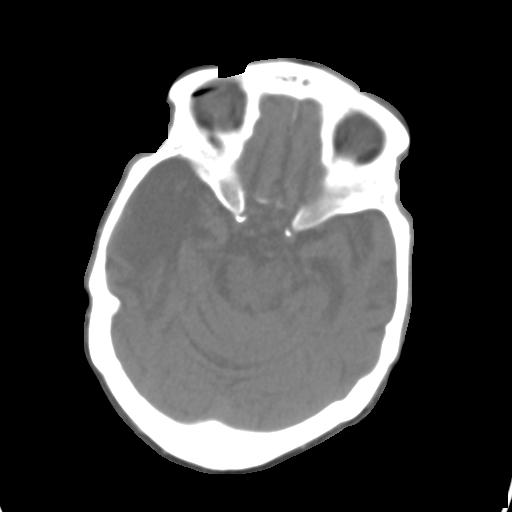
[im 19/28  soft-tissue]
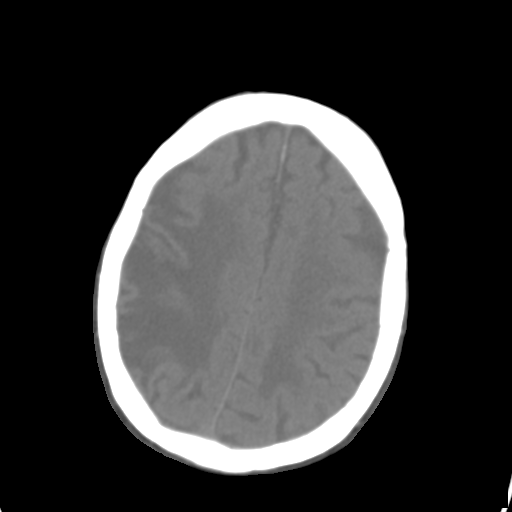

[10 of 14 positions shown; findings below may reference images not displayed]

CONTRAST:  CTA head was originally attempted, but the patient's
intravenous access extravasated, and no other suitable IV access
could be identified. 50 cc of Omnipaque 350 was extravasated at the
upper extremity IV site. The patient was examined by doctor [place]. No tense or palpable soft tissue lesion was identified at
the extravasation site.

No CTA was performed at this time.
FINDINGS: Stable paranasal sinuses and mastoids. Stable visualized osseous
structures. No acute orbit or scalp soft tissue findings.

Calcified atherosclerosis at the skull base.

Large area of right MCA chronic encephalomalacia appears stable.
Stable ventricle size and configuration. Stable gray-white matter
differentiation throughout the brain. No midline shift, mass effect,
or evidence of intracranial mass lesion. Small chronic lacunar
infarct in the left cerebellar hemisphere is stable. No evidence of
cortically based acute infarction identified. No acute intracranial
hemorrhage identified.
IMPRESSION: 1. Stable non contrast CT appearance of the brain since 01/06/2014.
Chronic large right MCA infarct and small chronic left cerebellar
lacunar infarct.
2. CTA attempt was aborted following extravasation of IV contrast,
and no additional IV access could be obtained.

## 2015-05-24 IMAGING — CR DG CHEST 2V
1 series · 1 of 1 positions shown · non-contrast
Comparison: March 03, 2014.

CLINICAL DATA: Leukocytosis.

EXAM:
CHEST  2 VIEW

[view not recorded]
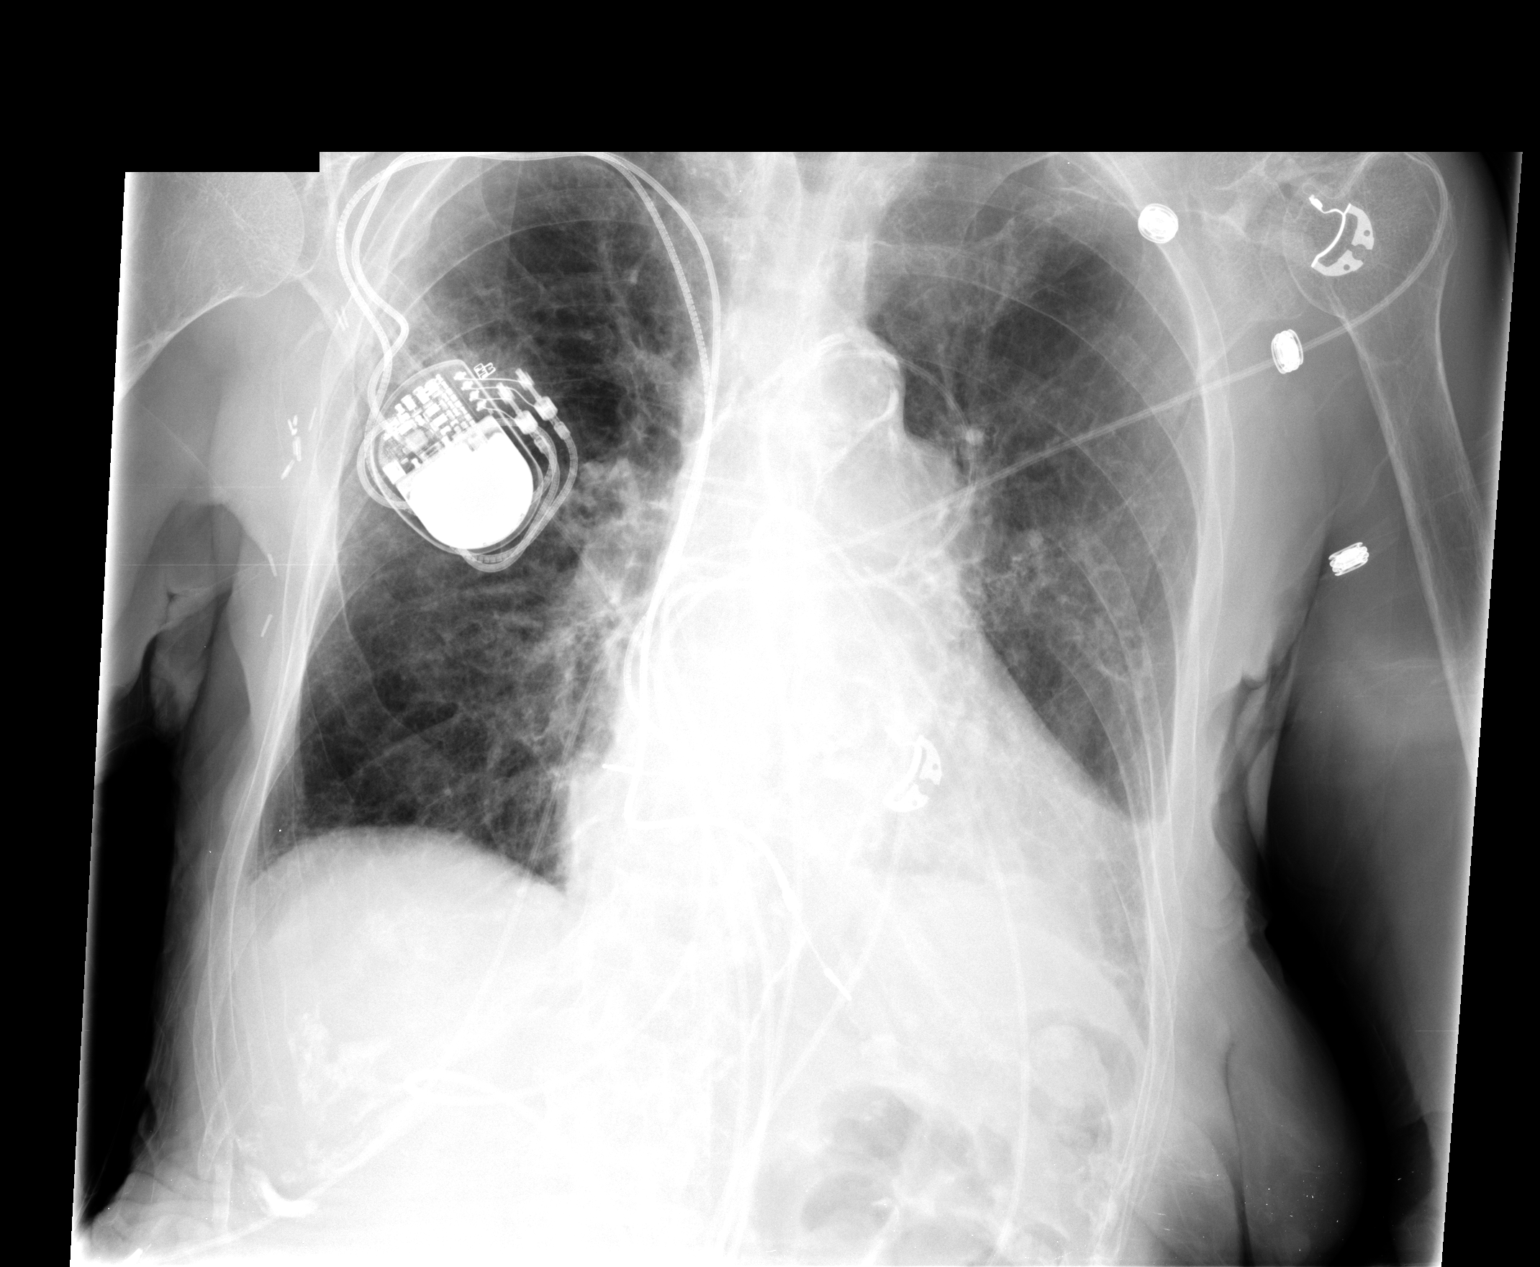

[1 of 1 positions shown; findings below may reference images not displayed]

FINDINGS: Stable cardiomegaly. Right-sided pacemaker is unchanged in position.
No pneumothorax or significant pleural effusion is noted. Surgical
clips are noted in right axillary region. Density is again noted
laterally in right upper lobe most consistent with scarring.
IMPRESSION: Stable chronic findings as described above. No significant change
compared to prior exam. No acute cardiopulmonary abnormality seen.

## 2016-01-16 ENCOUNTER — Other Ambulatory Visit: Payer: Self-pay | Admitting: Nurse Practitioner
